# Patient Record
Sex: Male | Born: 1945
Health system: Southern US, Community
[De-identification: ages and names within clinical notes are randomized; demographics above are authoritative.]

## PROBLEM LIST (undated history)

## (undated) DIAGNOSIS — E785 Hyperlipidemia, unspecified: Secondary | ICD-10-CM

## (undated) DIAGNOSIS — I259 Chronic ischemic heart disease, unspecified: Secondary | ICD-10-CM

## (undated) DIAGNOSIS — I5022 Chronic systolic (congestive) heart failure: Secondary | ICD-10-CM

## (undated) DIAGNOSIS — I4892 Unspecified atrial flutter: Secondary | ICD-10-CM

## (undated) DIAGNOSIS — I4891 Unspecified atrial fibrillation: Secondary | ICD-10-CM

## (undated) DIAGNOSIS — E7800 Pure hypercholesterolemia, unspecified: Secondary | ICD-10-CM

## (undated) DIAGNOSIS — U071 COVID-19: Secondary | ICD-10-CM

## (undated) DIAGNOSIS — N183 Chronic kidney disease, stage 3 unspecified: Secondary | ICD-10-CM

## (undated) DIAGNOSIS — F419 Anxiety disorder, unspecified: Secondary | ICD-10-CM

## (undated) DIAGNOSIS — I10 Essential (primary) hypertension: Secondary | ICD-10-CM

## (undated) DIAGNOSIS — I639 Cerebral infarction, unspecified: Secondary | ICD-10-CM

## (undated) DIAGNOSIS — M109 Gout, unspecified: Secondary | ICD-10-CM

## (undated) DIAGNOSIS — E119 Type 2 diabetes mellitus without complications: Secondary | ICD-10-CM

## (undated) DIAGNOSIS — I519 Heart disease, unspecified: Secondary | ICD-10-CM

## (undated) DIAGNOSIS — J449 Chronic obstructive pulmonary disease, unspecified: Secondary | ICD-10-CM

## (undated) DIAGNOSIS — I2510 Atherosclerotic heart disease of native coronary artery without angina pectoris: Secondary | ICD-10-CM

## (undated) DIAGNOSIS — N289 Disorder of kidney and ureter, unspecified: Secondary | ICD-10-CM

## (undated) DIAGNOSIS — F32A Depression, unspecified: Secondary | ICD-10-CM

## (undated) DIAGNOSIS — F329 Major depressive disorder, single episode, unspecified: Secondary | ICD-10-CM

## (undated) DIAGNOSIS — G4733 Obstructive sleep apnea (adult) (pediatric): Secondary | ICD-10-CM

## (undated) HISTORY — DX: Chronic obstructive pulmonary disease, unspecified: J44.9

## (undated) HISTORY — DX: Anxiety disorder, unspecified: F41.9

## (undated) HISTORY — DX: Hyperlipidemia, unspecified: E78.5

## (undated) HISTORY — DX: Unspecified atrial fibrillation: I48.91

## (undated) HISTORY — DX: Depression, unspecified: F32.A

## (undated) HISTORY — DX: Type 2 diabetes mellitus without complications: E11.9

## (undated) HISTORY — DX: Gout, unspecified: M10.9

## (undated) HISTORY — DX: Unspecified atrial fibrillation: I48.92

## (undated) HISTORY — PX: TONSILLECTOMY AND ADENOIDECTOMY: SUR1326

## (undated) HISTORY — DX: Chronic systolic (congestive) heart failure: I50.22

## (undated) HISTORY — DX: Cerebral infarction, unspecified: I63.9

## (undated) HISTORY — DX: Essential (primary) hypertension: I10

## (undated) HISTORY — DX: Obstructive sleep apnea (adult) (pediatric): G47.33

## (undated) HISTORY — DX: COVID-19: U07.1

## (undated) HISTORY — DX: Chronic kidney disease, stage 3 unspecified: N18.30

## (undated) HISTORY — DX: Major depressive disorder, single episode, unspecified: F32.9

## (undated) HISTORY — DX: Heart disease, unspecified: I51.9

## (undated) HISTORY — PX: CORONARY ARTERY BYPASS GRAFT: SHX141

## (undated) HISTORY — DX: Disorder of kidney and ureter, unspecified: N28.9

## (undated) HISTORY — PX: CATARACT EXTRACTION: SUR2

## (undated) HISTORY — PX: HERNIA REPAIR: SHX51

## (undated) HISTORY — DX: Chronic ischemic heart disease, unspecified: I25.9

## (undated) HISTORY — DX: Pure hypercholesterolemia, unspecified: E78.00

---

## 1999-07-07 HISTORY — PX: CORONARY ARTERY BYPASS GRAFT: SHX141

## 1999-09-16 HISTORY — PX: OTHER SURGICAL HISTORY: SHX169

## 2001-07-06 DIAGNOSIS — I639 Cerebral infarction, unspecified: Secondary | ICD-10-CM

## 2001-07-06 HISTORY — DX: Cerebral infarction, unspecified: I63.9

## 2009-07-06 HISTORY — PX: OTHER SURGICAL HISTORY: SHX169

## 2009-07-06 HISTORY — PX: ATRIAL FIBRILLATION ABLATION: EP1191

## 2014-08-09 ENCOUNTER — Encounter: Payer: Self-pay | Admitting: Neurology

## 2014-08-10 ENCOUNTER — Encounter: Payer: Self-pay | Admitting: Neurology

## 2014-08-10 ENCOUNTER — Ambulatory Visit (INDEPENDENT_AMBULATORY_CARE_PROVIDER_SITE_OTHER): Payer: Medicare HMO | Admitting: Neurology

## 2014-08-10 VITALS — BP 109/68 | HR 90 | Temp 97.9°F | Ht 69.0 in | Wt 280.0 lb

## 2014-08-10 DIAGNOSIS — R251 Tremor, unspecified: Secondary | ICD-10-CM

## 2014-08-10 NOTE — Progress Notes (Signed)
Subjective:    Patient ID: KI CORBO is a 69 y.o. male.  HPI     Star Age, MD, PhD [facility] [address] [place], [place] [address]  Dear Dr. Harmon Pier,   I saw your patient, Mose Colaizzi, upon your kind request in my neurologic clinic today for initial consultation of his tremors. The patient is accompanied by his wife today. As you know, Mr. Ciancio is a 69 year old right-handed gentleman with an underlying complex medical history of obesity, kidney disease, arthritis, A. fib, ischemic heart disease, status post CABG, stroke in 4/03 (saw neurologist in [place] ), type 2 diabetes, hyperlipidemia, OSA on CPAP, and hypertension, who reports intermittent bilateral upper extremity tremors for the past few years, more so in the last year, L more than R. this is bothersome to him at times when he tries to do fine motor tasks. He has had trouble with shaving and with feeding himself. He has an occasional rest tremor. He does not have a family history of tremors. He does not have a history of head injury. He does have low back pain. He does not have a family history of Parkinson's disease. Tremor is every day, but mostly mild and certainly intermittent. He quit smoking on 06/17/2011. He quit smoking after his open-heart surgery but restarted it after he started working again.  He sees a cardiologist, he sees a pulmonologist in [place], [place]. He also has a nephrologist. He had an eye exam a year ago. He is due for his yearly physical with you next month. He's never had any thyroid dysfunction as far as he knows. He has been on sertraline 150 mg for quite some time. He has been on sertraline for years. His wife wonders if it is effective at this time. She feels that he is quite irritable at times. He does not report much in the way of depressive symptoms and feels his mood is fair. He has been on Symbicort, and Spiriva and as needed  pro-air.  His Past Medical History Is Significant For: Past Medical History  Diagnosis Date  . Heart disease     Atherosclerotic  . Atrial fibrillation     His Past Surgical History Is Significant For: Past Surgical History  Procedure Laterality Date  . Coronary artery bypass graft      His Family History Is Significant For: No family history on file.  His Social History Is Significant For: History   Social History  . Marital Status: Married    Spouse Name: Aram Beecham    Number of Children: 3  . Years of Education: 14   Occupational History  .      retired   Social History Main Topics  . Smoking status: Former Research scientist (life sciences)  . Smokeless tobacco: Never Used  . Alcohol Use: No  . Drug Use: No  . Sexual Activity: None   Other Topics Concern  . None   Social History Narrative   Consumes no caffeine    His Allergies Are:  Allergies  Allergen Reactions  . Codeine Anaphylaxis    Tightness in chest  :   His Current Medications Are:  Outpatient Encounter Prescriptions as of 08/10/2014  Medication Sig  . albuterol (PROVENTIL HFA;VENTOLIN HFA) 108 (90 BASE) MCG/ACT inhaler Inhale into the lungs as needed for wheezing or shortness of breath. Inhale one to two puffs by mouth as needed  . allopurinol (ZYLOPRIM) 100 MG tablet Take 100 mg by mouth daily.  Marland Kitchen  amoxicillin-clavulanate (AUGMENTIN) 875-125 MG per tablet Take 1 tablet by mouth 2 (two) times daily.  . benazepril (LOTENSIN) 20 MG tablet Take 20 mg by mouth daily.  . Blood Glucose Monitoring Suppl (ONE TOUCH ULTRA 2) W/DEVICE KIT by Does not apply route.  . budesonide-formoterol (SYMBICORT) 160-4.5 MCG/ACT inhaler Inhale 2 puffs into the lungs daily.  . cephALEXin (KEFLEX) 500 MG capsule Take 500 mg by mouth 3 (three) times daily.  . clopidogrel (PLAVIX) 75 MG tablet Take 75 mg by mouth daily.  . colchicine 0.6 MG tablet Take 0.6 mg by mouth 2 (two) times daily. 2 tablets by mouth now, then 1 tablet by mouth 2 hours later  .  colchicine 0.6 MG tablet Take 0.6 mg by mouth daily.  Marland Kitchen diltiazem (CARDIZEM) 120 MG tablet Take 120 mg by mouth daily.  Marland Kitchen glucose blood test strip 1 each as needed for other. Onetouch ultra test strips,Use as instructed  . Insulin Detemir (LEVEMIR) 100 UNIT/ML Pen Inject into the skin daily at 10 pm.  . metoprolol succinate (TOPROL-XL) 50 MG 24 hr tablet Take 50 mg by mouth 2 (two) times daily. Take 1 tablet 2 times daily  . mometasone (NASONEX) 50 MCG/ACT nasal spray Place 2 sprays into the nose daily. 1 puff daily in the nostrils  . NIACIN PO Take 1,000 mg by mouth 2 (two) times daily.  Marland Kitchen omega-3 acid ethyl esters (LOVAZA) 1 G capsule Take by mouth 2 (two) times daily.  . Potassium Chloride (KLOR-CON 10 PO) Take by mouth daily. Extended release  . rosuvastatin (CRESTOR) 20 MG tablet Take 20 mg by mouth daily.  . sertraline (ZOLOFT) 100 MG tablet Take 100 mg by mouth daily.  Marland Kitchen spironolactone (ALDACTONE) 100 MG tablet Take 100 mg by mouth daily.  . temazepam (RESTORIL) 15 MG capsule Take 15 mg by mouth at bedtime as needed for sleep.  . Tiotropium Bromide Monohydrate 2.5 MCG/ACT AERS Inhale into the lungs. 1 inhalation  . torsemide (DEMADEX) 20 MG tablet Take 20 mg by mouth 2 (two) times daily.  Marland Kitchen warfarin (COUMADIN) 1 MG tablet Take 1 mg by mouth daily.  Marland Kitchen warfarin (COUMADIN) 3 MG tablet Take 3 mg by mouth daily.  :   Review of Systems:  Out of a complete 14 point review of systems, all are reviewed and negative with the exception of these symptoms as listed below:   Review of Systems  HENT: Positive for hearing loss.   Respiratory: Positive for shortness of breath.   Cardiovascular: Positive for leg swelling.  Genitourinary:       Impotence  Musculoskeletal:       Joint pain and aching muscles  Allergic/Immunologic:       Runny  nose  Neurological: Positive for tremors.       Snoring(CPAP)[place]  Hematological: Bruises/bleeds easily.  Psychiatric/Behavioral:       Depression,  decreased energy, disinterest in activities    Objective:  Neurologic Exam  Physical Exam Physical Examination:   Filed Vitals:   08/10/14 0920  BP: 109/68  Pulse: 90  Temp: 97.9 F (36.6 C)   General Examination: The patient is a very pleasant 69 y.o. male in no acute distress. He appears well-developed and well-nourished and well groomed.   HEENT: Normocephalic, atraumatic, pupils are equal, round and reactive to light and accommodation. Funduscopic exam is normal with sharp disc margins noted. Extraocular tracking is good without limitation to gaze excursion or nystagmus noted. Normal smooth pursuit is noted. Hearing is grossly  intact. Tympanic membranes are clear bilaterally. Face is symmetric with normal facial animation and normal facial sensation. Speech is clear with no dysarthria noted. There is no hypophonia. There is no lip, neck/head, jaw or voice tremor. Neck is supple with full range of passive and active motion. There are no carotid bruits on auscultation. Oropharynx exam reveals: mild mouth dryness, adequate dental hygiene and marked airway crowding, due to  larger tongue and thick soft palate. Tonsils are absent. Mallampati is class III.  Chest: Clear to auscultation without wheezing, rhonchi or crackles noted.  Heart: S1+S2+0, regular and normal without murmurs, rubs or gallops noted. his heart rate is regular at this time. He may not be in A. fib at this point.    Abdomen: Soft, non-tender and non-distended with normal bowel sounds appreciated on auscultation.  Extremities: There is 1+ pitting edema in the lower extremities bilaterally, from the knees on down and he is wearing compression stockings to up to the knees bilaterally. Pedal pulses are difficult to palpate.  Skin: Warm and dry without trophic changes noted. There are no varicose veins.  Musculoskeletal: exam reveals no obvious joint deformities, tenderness or joint swelling or erythema, he has a scar on the  left forearm from where blood vessels were harvested for his CABG surgery.   Neurologically:  Mental status: The patient is awake, alert and oriented in all 4 spheres. His immediate and remote memory, attention, language skills and fund of knowledge are appropriate. There is no evidence of aphasia, agnosia, apraxia or anomia. Speech is clear with normal prosody and enunciation. Thought process is linear. Mood is normal and affect is normal.  Cranial nerves II - XII are as described above under HEENT exam. In addition: shoulder shrug is normal with equal shoulder height noted. Motor exam: Normal bulk, strength and tone is noted. There is no drift, or rebound. There is no resting tremor. There is a bilateral upper extremity postural and action tremor, which is minimal in degree on the right and mild on the left. There tremor frequency is fairly fast and the amplitude is small. On Archimedes spiral drawing there is minimal tremulousness noted with the R hand and mild to moderate shakiness with the L. Handwriting is very clear and legible. There is no evidence of micrographia.  Romberg is negative. Reflexes are 2+ throughout. Babinski: Toes are flexor bilaterally. Fine motor skills and coordination: intact with normal finger taps, normal hand movements, normal rapid alternating patting, normal foot taps and normal foot agility.  Cerebellar testing: No dysmetria or intention tremor on finger to nose testing. Heel to shin is unremarkable bilaterally. There is no truncal or gait ataxia.  Sensory exam: intact to light touch, pinprick, vibration, temperature sense in the upper and lower extremities.  Gait, station and balance: He stands easily. No veering to one side is noted. No leaning to one side is noted. Posture is age-appropriate and stance is narrow based. Gait shows normal stride length and normal pace. No problems turning are noted. He turns in 3 steps. Tandem walk is  difficult for him.                  Assessment and Plan:   In summary, THATCHER DOBERSTEIN is a very pleasant 69 y.o.-year old male with an underlying complex medical history of obesity, kidney disease, arthritis, A. fib, ischemic heart disease, status post CABG, stroke in 4/03 (saw neurologist in [place] ), type 2 diabetes, hyperlipidemia, OSA on CPAP, and hypertension, who  reports intermittent bilateral upper extremity tremors for the past few years, more so in the last year, L more than R. on examination, he has a slight right upper extremity tremor, and a mild left upper extremity tremor, no significant problems with fine motor skills, no ataxia, no signs of parkinsonism, and his history is not suggestive of essential tremor. This could be an enhanced physiological tremor with possible medication effect from his COPD medication and also his SSRI type antidepressant. I had a long chat with the patient and his wife about this and symptomatic treatment for tremors. Unfortunately, he is already on quite a few medications including a beta blocker which we sometimes utilize for symptomatically treatment of tremors as you know. However, when he was on higher doses of metoprolol, such as 100 mg twice daily, he had drop in his blood pressure. Therefore, he was placed on diltiazem and metoprolol was reduced to currently 50 mg twice daily. Another medication we utilize for symptomatic control of tremors is primidone or Mysoline which unfortunately can be sedating and exacerbate balance. Again, his tremors rather mild at this time and I do not see any other focal neurologic signs or symptoms. He is reassured in that regard. I do not think we need to do any more testing at this point. I would like for him to have his thyroid function checked as a routine measure when he sees you next time which is due next month. At this moment I can see him back on an as-needed basis. I did talk to him about potential tremor triggers such as dehydration, low blood sugar  values, stress, anxiety, and sleep deprivation. He is compliant with CPAP therapy for his obstructive sleep apnea and sleeps well he states. His sugar values have been good. I answered all their questions today and the patient and his wife were in agreement.  Thank you very much for allowing me to participate in the care of this nice patient. If I can be of any further assistance to you please do not hesitate to call me at [phone].  Sincerely,   Star Age, MD, PhD

## 2014-08-10 NOTE — Patient Instructions (Addendum)
  Please remember, that any kind of tremor may be exacerbated by anxiety, anger, nervousness, excitement, dehydration, sleep deprivation, by caffeine, and low blood sugar values or blood sugar fluctuations. Some medications, especially some antidepressants and lithium can cause or exacerbate tremors. Tremors may temporarily calm down her subside with the use of a benzodiazepine such as Valium or related medications and with alcohol. Be aware however that drinking alcohol is not an approved treatment or appropriate treatment for tremor control and long-term use of benzodiazepines such as Valium, lorazepam, alprazolam, or clonazepam can cause habit formation, physical and psychological addiction.  Your tremor is mild at this time. It may be due to some of you COPD and depression medication. Talk to Dr. Harmon Pier about changing to another antidepressant, if you feel the sertraline is not as effective any longer.   I can see you back as needed.

## 2014-12-13 ENCOUNTER — Institutional Professional Consult (permissible substitution): Payer: Self-pay | Admitting: Pulmonary Disease

## 2014-12-14 ENCOUNTER — Other Ambulatory Visit (INDEPENDENT_AMBULATORY_CARE_PROVIDER_SITE_OTHER): Payer: Medicare FFS

## 2014-12-14 ENCOUNTER — Encounter: Payer: Self-pay | Admitting: Pulmonary Disease

## 2014-12-14 ENCOUNTER — Encounter (INDEPENDENT_AMBULATORY_CARE_PROVIDER_SITE_OTHER): Payer: Self-pay

## 2014-12-14 ENCOUNTER — Ambulatory Visit (INDEPENDENT_AMBULATORY_CARE_PROVIDER_SITE_OTHER)
Admission: RE | Admit: 2014-12-14 | Discharge: 2014-12-14 | Disposition: A | Discharge status: Home / Self Care | Payer: Medicare FFS | Source: Ambulatory Visit | Attending: Pulmonary Disease | Admitting: Pulmonary Disease

## 2014-12-14 ENCOUNTER — Ambulatory Visit (INDEPENDENT_AMBULATORY_CARE_PROVIDER_SITE_OTHER): Payer: Medicare FFS | Admitting: Pulmonary Disease

## 2014-12-14 VITALS — BP 118/60 | HR 72 | Temp 97.3°F | Ht 72.0 in | Wt 275.0 lb

## 2014-12-14 DIAGNOSIS — G4733 Obstructive sleep apnea (adult) (pediatric): Secondary | ICD-10-CM

## 2014-12-14 DIAGNOSIS — E669 Obesity, unspecified: Secondary | ICD-10-CM

## 2014-12-14 DIAGNOSIS — J449 Chronic obstructive pulmonary disease, unspecified: Secondary | ICD-10-CM

## 2014-12-14 DIAGNOSIS — E118 Type 2 diabetes mellitus with unspecified complications: Secondary | ICD-10-CM | POA: Insufficient documentation

## 2014-12-14 DIAGNOSIS — I2510 Atherosclerotic heart disease of native coronary artery without angina pectoris: Secondary | ICD-10-CM | POA: Diagnosis not present

## 2014-12-14 DIAGNOSIS — I5022 Chronic systolic (congestive) heart failure: Secondary | ICD-10-CM

## 2014-12-14 DIAGNOSIS — I10 Essential (primary) hypertension: Secondary | ICD-10-CM | POA: Diagnosis not present

## 2014-12-14 DIAGNOSIS — Z9989 Dependence on other enabling machines and devices: Secondary | ICD-10-CM

## 2014-12-14 DIAGNOSIS — I4821 Permanent atrial fibrillation: Secondary | ICD-10-CM | POA: Insufficient documentation

## 2014-12-14 DIAGNOSIS — I482 Chronic atrial fibrillation, unspecified: Secondary | ICD-10-CM

## 2014-12-14 DIAGNOSIS — I5023 Acute on chronic systolic (congestive) heart failure: Secondary | ICD-10-CM | POA: Insufficient documentation

## 2014-12-14 DIAGNOSIS — E785 Hyperlipidemia, unspecified: Secondary | ICD-10-CM | POA: Insufficient documentation

## 2014-12-14 DIAGNOSIS — Z951 Presence of aortocoronary bypass graft: Secondary | ICD-10-CM

## 2014-12-14 DIAGNOSIS — I4891 Unspecified atrial fibrillation: Secondary | ICD-10-CM | POA: Insufficient documentation

## 2014-12-14 DIAGNOSIS — E1169 Type 2 diabetes mellitus with other specified complication: Secondary | ICD-10-CM | POA: Insufficient documentation

## 2014-12-14 DIAGNOSIS — I509 Heart failure, unspecified: Secondary | ICD-10-CM | POA: Insufficient documentation

## 2014-12-14 LAB — BASIC METABOLIC PANEL
BUN: 46 mg/dL — AB (ref 6–23)
CO2: 26 mEq/L (ref 19–32)
Calcium: 10.9 mg/dL — ABNORMAL HIGH (ref 8.4–10.5)
Chloride: 100 mEq/L (ref 96–112)
Creatinine, Ser: 1.6 mg/dL — ABNORMAL HIGH (ref 0.40–1.50)
GFR: 46.12 mL/min — ABNORMAL LOW (ref >60.00)
Glucose, Bld: 141 mg/dL — ABNORMAL HIGH (ref 70–99)
POTASSIUM: 4.7 meq/L (ref 3.5–5.1)
Sodium: 136 mEq/L (ref 135–145)

## 2014-12-14 LAB — CBC WITH DIFFERENTIAL/PLATELET
BASOS ABS: 0 10*3/uL (ref 0.0–0.1)
Basophils Relative: 0.4 % (ref 0.0–3.0)
EOS ABS: 0.1 10*3/uL (ref 0.0–0.7)
Eosinophils Relative: 1.4 % (ref 0.0–5.0)
HEMATOCRIT: 51.2 % (ref 39.0–52.0)
Hemoglobin: 16.8 g/dL (ref 13.0–17.0)
Lymphocytes Relative: 16.6 % (ref 12.0–46.0)
Lymphs Abs: 1.4 10*3/uL (ref 0.7–4.0)
MCHC: 32.8 g/dL (ref 30.0–36.0)
MCV: 87.5 fl (ref 78.0–100.0)
MONOS PCT: 10.7 % (ref 3.0–12.0)
Monocytes Absolute: 0.9 10*3/uL (ref 0.1–1.0)
NEUTROS PCT: 70.9 % (ref 43.0–77.0)
Neutro Abs: 5.8 10*3/uL (ref 1.4–7.7)
Platelets: 206 10*3/uL (ref 150.0–400.0)
RBC: 5.85 Mil/uL — AB (ref 4.22–5.81)
RDW: 16.3 % — AB (ref 11.5–15.5)
WBC: 8.1 10*3/uL (ref 4.0–10.5)

## 2014-12-14 LAB — HEPATIC FUNCTION PANEL
ALK PHOS: 76 U/L (ref 39–117)
ALT: 20 U/L (ref 0–53)
AST: 16 U/L (ref 0–37)
Albumin: 4.6 g/dL (ref 3.5–5.2)
Bilirubin, Direct: 0.1 mg/dL (ref 0.0–0.3)
TOTAL PROTEIN: 7.9 g/dL (ref 6.0–8.3)
Total Bilirubin: 0.7 mg/dL (ref 0.2–1.2)

## 2014-12-14 LAB — BRAIN NATRIURETIC PEPTIDE: PRO B NATRI PEPTIDE: 72 pg/mL (ref 0.0–100.0)

## 2014-12-14 LAB — SEDIMENTATION RATE: SED RATE: 20 mm/h (ref 0–22)

## 2014-12-14 LAB — TSH: TSH: 1.37 u[IU]/mL (ref 0.35–4.50)

## 2014-12-14 MED ORDER — FLUTICASONE-SALMETEROL 500-50 MCG/DOSE IN AEPB: 1.0000 | INHALATION_SPRAY | Freq: Two times a day (BID) | RESPIRATORY_TRACT | Status: DC

## 2014-12-14 NOTE — Progress Notes (Signed)
Subjective:     Patient ID: [facility], male   DOB: 1945-11-25, 69 y.o.   MRN: [id number]  HPI 69 y/o WM, from [facility], self referred for a pulmonary second opinion>  His local PCP is Tommy Riley, his Cardiologist is [facility] Riley, and his Pulonologist is [facility];  He has multisystem disease w/ OSA, obesity, and COPD; plus HBP, CAD- s/pCABG, AFib, Hyperlipidemia, DM, renal insuffic, Hx stroke, and arthritis...  From the pulmonary standpoint- he has OSA & on CPAP x yrs, doing satis w/ his machine & mask, rests well & denies daytime sleepiness issues;  He is currently on 3 inhalers> Pulmicort-2spBid, Spiriva daily, and Proair rescue inhaler but having to use this 3-4 times daily; he was prev on Symbicort160-2spBid but this was stopped after a Neuro eval by [facility] who thought his LABA was contributing to the shaking & the tremor has improved off the Formoterol (interesting that it has NOT returned w/ incr use of the Albuterol in Proair);  He is c/o increased SOB & feels it is getting worse, specifically notes DOE w/ min activity & ADLs like showering, dressing, min work about the house; on further questioning it is apparent that his increased DOE has paralleled his increased sedentary lifestyle; he tells me he was Dx w/ CAD & had a 3 vessel CABG in 2001 after which he participated in cardiac rehab & he was apparently doing quite well at that time but developed AFib, had several TIAs, and retired on disability in 2003; since then he has had an increasingly sedentary lifestyle with progressive DOE despite his cardiac & pulmonary meds...       PULM> Tommy Riley is an ex-smoker, having started in his teens and smoked for 40+yrs up to 1-2ppd in his adult life, and quit in 2012 at age 18 or so (I est a 18 pack-year history);  He notes SOB as above- plus cough, small amt of thick beige sputum that is hard to expectorate, no hemoptysis & no CP; he related a hx of one prev bronchoscopy w/ lavage ~10 yrs ago due to  "thick phlegm & plugs that had to be sucked out";  He denies hx of asthma in the past but he says he's had pneumonia x3 in the past including double pneumonia when he was in the service in [employer]; he says he was first diagnosed w/ COPD about 3 yrs ago... 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 FamHx is neg for any respiratory illnesses... We have requested old records from [facility] including prev CXRs, CT Chest, FullPFTs, ABGs, etc... Current Meds> Pulmicort-2spBid, Spiriva daily, and Proair rescue inhaler but having to use this 3-4 times daily.      CARDS> He has a hx of mult coronary risk factors- HBP, DM, HL; he had ?MI 2001 w/ subseq three vessel CABG; he developed AFib & had several cardioversions w/o success;  Records are avail in [facility] from [facility] in 2011- he had EP studies and pulmonary vein isolation procedure which was transiently successful but the AFib recurred shortly thereafter (his fatigue & decr exercise tolerance where attributed to the AFib); a cardiac MRI at that time is said to show an EF=45-50% w/ marked LAE & no thrombus, but evid for mult subendocardial infarcts in all 3 coronary art distributions; he din't tol Tykosyn & was restarted on  Amio... We have requested records from [facility] including his most recent 2DEcho (pt says his heart was at 37%) & ant cath data.  Current Meds> MetopER50Bid, Cardizem120, Demadex20Bid, Aldactone100, ?K20, ASA/ Plavix/ Coumadin...  EXAM showed Afeb, VSS, O2sat=95% on RA; he is obese- 6' Tall, 275#, BMI=37;  HEENT- short/ heavy neck, mallampati3;  Chest- decr BS w/ few scat rhonchi at bases, no wheezing or rales;  Heart- irreg gr1/6 SEM w/o r/g;  Abd- obese, soft, neg;  Ext- TEDs, 1+edema  CXR 12/14/14 showed cardiomegaly, prior CABG, clear lungs except for some bibasilar scarring,  NAD...   Spirometry 12/14/14 showed FVC=2.73 (56%), FEV1=1.63 (43%), %1sec=60, mid-flows are reduced at 24% predicted; this is c/w mod airflow obstruction and GOLD Stage3 COPD...   Ambulatory oxygen saturation test 12/2014> O2sat=95% on RA at rest w/ pulse=97/min;  He walked 2 laps in out office w/ lowest O2sat=93% w/ pulse=111/min & stopped due to SOB & fatigue.  LABS 12/2014:  Chems- ok x BS=141, BUN=46, Cr=1.6;  CBC- wnl w/ Hg=16.8;  BNP=72;  Sed=20;  TSH=1.37  IMP/PLAN>>   We took extra time to explain the multifactorial nature of his dyspnea- pulm/ cardiac/ deconditioning;  From the pulmonary standpoint we decided to change the Pulmicort to ADVAIR500-Bid, continue the [facility] daily, continue the [facility] rescue inhaler but hopefully he will be able to decr it's use;  He can also use MUCINEX 661mQid w/ fluids for the thick phlegm;  From the cardiac perspective we have requested records from [facility](esp the most recent 2DEcho to check EF & PA pressure estimates), note that his BNP is norm at 72;  Finally it is imperative that he starts back into a slow gradual exercise program (eg- [facility] etc) & we discussed this in detail... I have asked him to ret in 4-6wks for recheck & hopefully we will have his old records to review.    Past Medical History  Diagnosis Date  . Heart disease  >> on MetoprololER, Cardizem, Demadex, Aldactone, ?KCl, off prev Lotensin     Atherosclerotic  . Atrial fibrillation >> on ASA, Plavix, Coumadin   . Hypertension   . Diabetes mellitus without complication >> on Levemir insulin   . Atrial fibrillation   . Stroke 2003  . Kidney disease     stage 3  . High cholesterol >> on Crestor, Lovaza, Niacin   . Depression   . COPD (chronic obstructive pulmonary disease)   . OSA (obstructive sleep apnea)     Past Surgical History  Procedure Laterality Date  . Coronary artery bypass graft    . Triple by pass  09/16/1999  . Heart ablation  2011    Outpatient  Encounter Prescriptions as of 12/14/2014  Medication Sig  . ACCU-CHEK AVIVA PLUS test strip   . albuterol (PROVENTIL HFA;VENTOLIN HFA) 108 (90 BASE) MCG/ACT inhaler Inhale 2 puffs into the lungs every 4 (four) hours as needed for wheezing or shortness of breath. Inhale one to two puffs by mouth as needed  . allopurinol (ZYLOPRIM) 100 MG tablet Take 100 mg by mouth 2 (two) times daily.   .Marland Kitchenaspirin 81 MG tablet Take 81 mg by mouth daily.  . Blood Glucose Monitoring Suppl (ONE TOUCH ULTRA 2) W/DEVICE KIT by Does not apply route.  . Cholecalciferol (VITAMIN D3) 2000 UNITS TABS Take by mouth daily.  . clopidogrel (PLAVIX) 75 MG tablet Take 75 mg by mouth daily.  .Marland Kitchendiltiazem (CARDIZEM) 120 MG tablet Take 120 mg by mouth daily.  .Marland Kitchen  folic acid (FOLVITE) 007 MCG tablet Take 400 mcg by mouth daily.  . insulin detemir (LEVEMIR) 100 UNIT/ML injection Inject 80 Units into the skin daily.  . Melatonin 5 MG TABS Take by mouth daily.  . metoprolol succinate (TOPROL-XL) 50 MG 24 hr tablet Take 50 mg by mouth 2 (two) times daily. Take 1 tablet 2 times daily  . Multiple Vitamins-Minerals (CENTRUM Tommy PO) Take by mouth 2 (two) times daily.  Marland Kitchen NIACIN PO Take 500 mg by mouth 2 (two) times daily. Take 500 mg two times daily  . omega-3 acid ethyl esters (LOVAZA) 1 G capsule Take by mouth 2 (two) times daily.  . Potassium Chloride (KLOR-CON 10 PO) Take by mouth daily. Extended release  . rosuvastatin (CRESTOR) 20 MG tablet Take 20 mg by mouth daily.  Marland Kitchen spironolactone (ALDACTONE) 100 MG tablet Take 100 mg by mouth daily.  . temazepam (RESTORIL) 15 MG capsule Take 15 mg by mouth at bedtime as needed for sleep.  Marland Kitchen tiotropium (SPIRIVA HANDIHALER) 18 MCG inhalation capsule Place 18 mcg into inhaler and inhale daily.  Marland Kitchen torsemide (DEMADEX) 20 MG tablet Take 20 mg by mouth 2 (two) times daily.  Marland Kitchen warfarin (COUMADIN) 3 MG tablet Take 3 mg by mouth daily.  . Fluticasone-Salmeterol (ADVAIR DISKUS) 500-50 MCG/DOSE AEPB Inhale  1 puff into the lungs 2 (two) times daily.  . [DISCONTINUED] benazepril (LOTENSIN) 20 MG tablet Take 20 mg by mouth daily.  . [DISCONTINUED] budesonide-formoterol (SYMBICORT) 160-4.5 MCG/ACT inhaler Inhale 2 puffs into the lungs daily.  . [DISCONTINUED] Insulin Detemir (LEVEMIR) 100 UNIT/ML Pen Inject 80 Units into the skin daily at 10 pm.   . [DISCONTINUED] sertraline (ZOLOFT) 100 MG tablet Take 100 mg by mouth daily. Take 1 1/2 tablet daily   No facility-administered encounter medications on file as of 12/14/2014.    Allergies  Allergen Reactions  . Codeine Anaphylaxis    Tightness in chest    Immunization History  Administered Date(s) Administered  . Influenza-Unspecified 04/14/2014  . Pneumococcal Conjugate-13 04/14/2014    Family History  Problem Relation Age of Onset  . Stroke Father   . Heart Problems Mother     History   Social History  . Marital Status: Married    Spouse Name: Aram Beecham  . Number of Children: 3  . Years of Education: 14   Occupational History  .      retired   Social History Main Topics  . Smoking status: Former Smoker -- 2.50 packs/day for 50 years    Types: Cigarettes    Quit date: 06/15/2011  . Smokeless tobacco: Never Used  . Alcohol Use: No  . Drug Use: No  . Sexual Activity: Not on file   Other Topics Concern  . Not on file   Social History Narrative   Consumes no caffeine    Current Medications, Allergies, Past Medical History, Past Surgical History, Family History, and Social History were reviewed in [facility] record.   Review of Systems            All symptoms NEG except where BOLDED >>  Constitutional:  F/C/S, fatigue, anorexia, unexpected weight change. HEENT:  HA, visual changes, hearing loss, earache, nasal symptoms, sore throat, mouth sores, hoarseness. Resp:  cough, sputum, hemoptysis; SOB, tightness, wheezing. Cardio:  CP, palpit, DOE, orthopnea, edema. GI:  N/V/D/C, blood in stool; reflux,  abd pain, distention, gas. GU:  dysuria, freq, urgency, hematuria, flank pain, voiding difficulty. MS:  joint pain, swelling, tenderness,  decr ROM; neck pain, back pain, etc. Neuro:  HA, tremors, seizures, dizziness, syncope, weakness, numbness, gait abn. Skin:  suspicious lesions or skin rash. Heme:  adenopathy, bruising, bleeding. Psyche:  confusion, agitation, sleep disturbance, hallucinations, anxiety, depression suicidal.   Objective:   Physical Exam      Vital Signs:  Reviewed...  General:  WD, obese, 69 y/o WM in NAD; alert & oriented; pleasant & cooperative... HEENT:  [place]/AT; Conjunctiva- pink, Sclera- nonicteric, EOM-wnl, PERRLA, EACs-clear, TMs-wnl; NOSE-clear; THROAT-clear & wnl. Neck:  Supple w/ decr ROM; no JVD; normal carotid impulses w/o bruits; no thyromegaly or nodules palpated; no lymphadenopathy. Chest:  decr BS at bases, few scat rhonchi, no wheezing,rales, or signs of consolidation... Heart:  Irreg, AFib, Gr1/6 SEM w/o rubs or gallops detected... Abdomen:  Soft & nontender- no guarding or rebound; normal bowel sounds; no organomegaly or masses palpated. Ext:  decrROM; +arthritic changes; no varicose veins, +venous insuffic, 1+ edema;  Pulses intact w/o bruits. Neuro:  No focal neuro deficits,gait normal & balance OK. Derm:  No lesions noted; no rash etc. Lymph:  No cervical, supraclavicular, axillary, or inguinal adenopathy palpated.   Assessment:     Moderate airflow obsruction & GOLD Stage3 COPD -- Rx w/ ADVAIR250Bid, SPIRIVA daily, PROAIR rescue prn; plus MUCINEX, fluids, etc... OSA- seems to be well controlled on CPAP17 -- we will try to get data from [facility] including download data re: usage & O2sats...  Severe deconditioning -- he hasn't moved in >40yr but remembers how good he felt during cardiac rehab in 2001; may need formal program, for now just start moving (eg. [employer], etc). Obesity  CARDS -- HBP, CAD s/pCABG, permanent AFib, chronic systolic  CHF, etc... [facility].  Medical issues -- DM on insulin, Renal insuffic w/ Cr=1.6 range, HL, arthritis, etc...  IMP/PLAN>>   We took extra time to explain the multifactorial nature of his dyspnea- pulm/ cardiac/ deconditioning;  From the pulmonary standpoint we decided to change the Pulmicort to ADVAIR500-Bid, [facility], [facility] but hopefully he will be able to decr it's use;  He can also use MUCINEX 6069mid w/ fluids for the thick phlegm;  From the cardiac perspective [facility] most recent 2[facility] to check EF & PA pressure estimates), note that his BNP is norm at 72;  Finally it is imperative that he starts back into a slow gradual exercise program (eg- [facility] etc) & we discussed this in detail... I have asked him to ret in 4-6wks for recheck & hopefully we will have his old records to review.     Plan:     Patient's Medications  New Prescriptions   FLUTICASONE-SALMETEROL (ADVAIR DISKUS) 500-50 MCG/DOSE AEPB    Inhale 1 puff into the lungs 2 (two) times daily.  Previous Medications   ACCU-CHEK AVIVA PLUS TEST STRIP       ALBUTEROL (PROVENTIL HFA;VENTOLIN HFA) 108 (90 BASE) MCG/ACT INHALER    Inhale 2 puffs into the lungs every 4 (four) hours as needed for wheezing or shortness of breath. Inhale one to two puffs by mouth as needed   ALLOPURINOL (ZYLOPRIM) 100 MG TABLET    Take 100 mg by mouth 2 (two) times daily.    ASPIRIN 81 MG TABLET    Take 81 mg by mouth daily.   BLOOD GLUCOSE MONITORING SUPPL (ONE TOUCH ULTRA 2) W/DEVICE KIT    by Does not apply route.  CHOLECALCIFEROL (VITAMIN D3) 2000 UNITS TABS    Take by mouth daily.   CLOPIDOGREL (PLAVIX) 75 MG TABLET    Take 75 mg by mouth daily.   DILTIAZEM (CARDIZEM) 120 MG TABLET    Take 120 mg by mouth daily.   FOLIC ACID (FOLVITE) 578 MCG TABLET    Take 400 mcg by mouth daily.   INSULIN DETEMIR (LEVEMIR) 100 UNIT/ML INJECTION     Inject 80 Units into the skin daily.   MELATONIN 5 MG TABS    Take by mouth daily.   METOPROLOL SUCCINATE (TOPROL-XL) 50 MG 24 HR TABLET    Take 50 mg by mouth 2 (two) times daily. Take 1 tablet 2 times daily   MULTIPLE VITAMINS-MINERALS (CENTRUM Tommy PO)    Take by mouth 2 (two) times daily.   NIACIN PO    Take 500 mg by mouth 2 (two) times daily. Take 500 mg two times daily   OMEGA-3 ACID ETHYL ESTERS (LOVAZA) 1 G CAPSULE    Take by mouth 2 (two) times daily.   POTASSIUM CHLORIDE (KLOR-CON 10 PO)    Take by mouth daily. Extended release   ROSUVASTATIN (CRESTOR) 20 MG TABLET    Take 20 mg by mouth daily.   SPIRONOLACTONE (ALDACTONE) 100 MG TABLET    Take 100 mg by mouth daily.   TEMAZEPAM (RESTORIL) 15 MG CAPSULE    Take 15 mg by mouth at bedtime as needed for sleep.   TIOTROPIUM (SPIRIVA HANDIHALER) 18 MCG INHALATION CAPSULE    Place 18 mcg into inhaler and inhale daily.   TORSEMIDE (DEMADEX) 20 MG TABLET    Take 20 mg by mouth 2 (two) times daily.   WARFARIN (COUMADIN) 3 MG TABLET    Take 3 mg by mouth daily.  Modified Medications   No medications on file  Discontinued Medications   BENAZEPRIL (LOTENSIN) 20 MG TABLET    Take 20 mg by mouth daily.   BUDESONIDE-FORMOTEROL (SYMBICORT) 160-4.5 MCG/ACT INHALER    Inhale 2 puffs into the lungs daily.   INSULIN DETEMIR (LEVEMIR) 100 UNIT/ML PEN    Inject 80 Units into the skin daily at 10 pm.    SERTRALINE (ZOLOFT) 100 MG TABLET    Take 100 mg by mouth daily. Take 1 1/2 tablet daily

## 2014-12-14 NOTE — Patient Instructions (Signed)
Neftali- it was great meeting you today.... 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  Today we did a pulmonary function test and an ambulatory oxygen saturation test... We also checked a CXR & some lab work...    We will contact you w/ the results when available...   In the meanwhile>>    Change the Pulmicort inhaler to ADVAIR500- one inhalation twice daily...    Continue the [facility] daily...    Continue the PROAIR rescue inhaler as needed for wheezing...  We will request records from [facility] to review...  As we discussed- the most important factor that will yield to best results for you over time is EXERCISE>    In this regard you need to start a gradual exercise program similar to what you remember from your cardiac rehab days...    Try SILVER SNEAKERS, get back in the pool, join a gym w/ a Physiological scientist, etc...    Start light & gradually increase the exercise (remember the "stairs" analogy  Call for any questions...  Let's plan a follow up visit in 4-6 weeks, sooner if needed for breathing problems.Marland KitchenMarland Kitchen

## 2014-12-17 ENCOUNTER — Telehealth: Payer: Self-pay | Admitting: Pulmonary Disease

## 2014-12-17 MED ORDER — FLUTICASONE-SALMETEROL 500-50 MCG/DOSE IN AEPB: 1.0000 | INHALATION_SPRAY | Freq: Two times a day (BID) | RESPIRATORY_TRACT | Status: DC

## 2014-12-17 NOTE — Telephone Encounter (Signed)
Patient needs refill of Advair, needs hand-written Rx mailed to him so he can submit it to his pharmacy company with some other paperwork that he needs to send with it.  Printed Rx for SN to sign.   [facility] - please place in mail after signed.

## 2014-12-18 NOTE — Telephone Encounter (Signed)
Order signed by SN and mailed to pt. Nothing further is needed.

## 2015-01-11 ENCOUNTER — Ambulatory Visit (INDEPENDENT_AMBULATORY_CARE_PROVIDER_SITE_OTHER): Payer: Medicare FFS | Admitting: Pulmonary Disease

## 2015-01-11 ENCOUNTER — Encounter: Payer: Self-pay | Admitting: Pulmonary Disease

## 2015-01-11 VITALS — BP 130/80 | HR 111 | Temp 97.3°F | Wt 287.6 lb

## 2015-01-11 DIAGNOSIS — G4733 Obstructive sleep apnea (adult) (pediatric): Secondary | ICD-10-CM | POA: Diagnosis not present

## 2015-01-11 DIAGNOSIS — I2510 Atherosclerotic heart disease of native coronary artery without angina pectoris: Secondary | ICD-10-CM

## 2015-01-11 DIAGNOSIS — I482 Chronic atrial fibrillation, unspecified: Secondary | ICD-10-CM

## 2015-01-11 DIAGNOSIS — Z9989 Dependence on other enabling machines and devices: Secondary | ICD-10-CM

## 2015-01-11 DIAGNOSIS — J449 Chronic obstructive pulmonary disease, unspecified: Secondary | ICD-10-CM

## 2015-01-11 DIAGNOSIS — Z951 Presence of aortocoronary bypass graft: Secondary | ICD-10-CM | POA: Diagnosis not present

## 2015-01-11 DIAGNOSIS — I5022 Chronic systolic (congestive) heart failure: Secondary | ICD-10-CM

## 2015-01-11 MED ORDER — FLUTICASONE-SALMETEROL 250-50 MCG/DOSE IN AEPB: 1.0000 | INHALATION_SPRAY | Freq: Two times a day (BID) | RESPIRATORY_TRACT | Status: DC

## 2015-01-11 NOTE — Patient Instructions (Signed)
Today we updated your med list in our EPIC system...    Continue your current medications the same...  We discussed stepping down the ADVAIR from the 500/50 to the 250/50- still one inhalation twice daily...  Continue the [facility] daily...   We will call to refer you to [place] in your area for pulmonary rehab...  Continue to work on weight reduction...  Call for any questions... . Let's plan a follow up visit in 2-80mo, sooner if needed for any breathing problems.Marland KitchenMarland Kitchenproblems.Marland KitchenMarland Kitchen

## 2015-01-11 NOTE — Progress Notes (Signed)
Subjective:     Patient ID: Tommy Riley, male   DOB: 1945-12-10, 69 y.o.   MRN: [id number]  HPI ~  December 14, 2014:  Initial consult w/ SN>   26 y/o WM, from [place], self referred for a pulmonary second opinion>  His local PCP is DrPomposini, his Cardiologist is Secretary/administrator, and his Pulonologist is DrKBird;  He has multisystem disease w/ OSA, obesity, and COPD; plus HBP, CAD- s/pCABG, AFib, Hyperlipidemia, DM, renal insuffic, Hx stroke, and arthritis...  From the pulmonary standpoint- he has OSA & on CPAP x yrs, doing satis w/ his machine & mask, rests well & denies daytime sleepiness issues;  He is currently on 3 inhalers> Pulmicort-2spBid, Spiriva daily, and Proair rescue inhaler but having to use this 3-4 times daily; he was prev on Symbicort160-2spBid but this was stopped after a Neuro eval by DrAthar who thought his LABA was contributing to the shaking & the tremor has improved off the Formoterol (interesting that it has NOT returned w/ incr use of the Albuterol in Proair);  He is c/o increased SOB & feels it is getting worse, specifically notes DOE w/ min activity & ADLs like showering, dressing, min work about the house; on further questioning it is apparent that his increased DOE has paralleled his increased sedentary lifestyle; he tells me he was Dx w/ CAD & had a 3 vessel CABG in 2001 after which he participated in cardiac rehab & he was apparently doing quite well at that time but developed AFib, had several TIAs, and retired on disability in 2003; since then he has had an increasingly sedentary lifestyle with progressive DOE despite his cardiac & pulmonary meds...       PULM> Loretta is an ex-smoker, having started in his teens and smoked for 40+yrs up to 1-2ppd in his adult life, and quit in 2012 at age 17 or so (I est a 32 pack-year history);  He notes SOB as above- plus cough, small amt of thick beige sputum that is hard to expectorate, no hemoptysis & no CP; he related a hx of one prev  bronchoscopy w/ lavage ~10 yrs ago due to "thick phlegm & plugs that had to be sucked out";  He denies hx of asthma in the past but he says he's had pneumonia x3 in the past including double pneumonia when he was in the service in [place]; he says he was first diagnosed w/ COPD about 3 yrs ago... 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 FamHx is neg for any respiratory illnesses... We have requested old records from [facility] including prev CXRs, CT Chest, FullPFTs, ABGs, etc... Current Meds> Pulmicort-2spBid, Spiriva daily, and Proair rescue inhaler but having to use this 3-4 times daily.      CARDS> He has a hx of mult coronary risk factors- HBP, DM, HL; he had ?MI 2001 w/ subseq three vessel CABG; he developed AFib & had several cardioversions w/o success;  Records are avail in [place] from [place] in 2011- he had EP studies and pulmonary vein isolation procedure which was transiently successful but the AFib recurred shortly thereafter (his fatigue & decr exercise tolerance where attributed to the AFib); a cardiac MRI at that time is said to show an EF=45-50% w/ marked LAE & no thrombus, but evid for mult subendocardial infarcts in all  3 coronary art distributions; he didn't tol Tykosyn & was restarted on Amio... We have requested records from [facility] including his most recent 2DEcho (pt says his heart was at 37%) & and cath data.  Current Meds> MetopER50Bid, Cardizem120, Demadex20Qam (extra prn), Aldactone100, ?K20, ASA/ Plavix/ Coumadin...  EXAM showed Afeb, VSS, O2sat=95% on RA; he is obese- 6' Tall, 275#, BMI=37;  HEENT- short/ heavy neck, mallampati3;  Chest- decr BS w/ few scat rhonchi at bases, no wheezing or rales;  Heart- irreg gr1/6 SEM w/o r/g;  Abd- obese, soft, neg;  Ext- TEDs, 1+edema  CXR 12/14/14 showed cardiomegaly, prior  CABG, clear lungs except for some bibasilar scarring, NAD...   Spirometry 12/14/14 showed FVC=2.73 (56%), FEV1=1.63 (43%), %1sec=60, mid-flows are reduced at 24% predicted; this is c/w mod airflow obstruction and GOLD Stage3 COPD...   Ambulatory oxygen saturation test 12/2014> O2sat=95% on RA at rest w/ pulse=97/min;  He walked 2 laps in out office w/ lowest O2sat=93% w/ pulse=111/min & stopped due to SOB & fatigue.  LABS 12/2014:  Chems- ok x BS=141, BUN=46, Cr=1.6;  CBC- wnl w/ Hg=16.8;  BNP=72;  Sed=20;  TSH=1.37  IMP/PLAN>>   We took extra time to explain the multifactorial nature of his dyspnea- pulm/ cardiac/ deconditioning;  From the pulmonary standpoint we decided to change the Pulmicort to ADVAIR500-Bid, continue the [facility] daily, continue the [facility] rescue inhaler but hopefully he will be able to decr it's use;  He can also use MUCINEX 650mQid w/ fluids for the thick phlegm;  From the cardiac perspective we have [place](esp the most recent 2DEcho to check EF & PA pressure estimates), note that his BNP is norm at 72;  Finally it is imperative that he starts back into a slow gradual exercise program (eg- Silver Sneakers etc) & we discussed this in detail... I have asked him to ret in 4-6wks for recheck & hopefully we will have his old records to review.    ~  January 11, 2015:  191moOV w/ SN>  MaMusaeports that he is feeling better on the ADVAIR500Bid, Spiriva daily, & Proair rescue (in fact he hasn't used the [employer] his last visit!);  We have [place] received any records [place])- we will request these again;  He has inquired at his local hosp but they no longer offer pulm rehab program, however he has [facility] and we will call to arrange this for him... Unfortunately MaLavelleas gained a few lbs but admits to not taking the  diet & exercise prescription seriously enough- he will re-double his efforts in this regard... EXAM reveals Afeb, VSS, O2sat=92% on RA, wt=288# today;  HEENT- short neck, mallampati3;  Chest- decr BS at bases otherw clear;  Heart- irreg, gr1/6 SEM, w/o r/g;  Abd- obese, soft, neg;  Ext- TEDs, 1+edema...  IMP/PLAN>>  MaWenceslauss improved on the Advair500, Spiriva, Proair (hasn't used this in the last month); OK to wean to the Avdair250Bid... Diet & exercise are critically important here & we reviewed this...  We [place] again to get [place].     Past Medical History  Diagnosis Date  . Heart disease  >> on MetoprololER, Cardizem, Demadex, Aldactone, ?KCl, off prev Lotensin     Atherosclerotic  . Atrial  fibrillation >> on ASA, Plavix, Coumadin   . Hypertension   . Diabetes mellitus without complication >> on Levemir insulin   . Atrial fibrillation   . Stroke 2003  . Kidney disease     stage 3  . High cholesterol >> on Crestor, Lovaza, Niacin   . Depression   . COPD (chronic obstructive pulmonary disease)   . OSA (obstructive sleep apnea)     Past Surgical History  Procedure Laterality Date  . Coronary artery bypass graft    . Triple by pass  09/16/1999  . Heart ablation  2011    Outpatient Encounter Prescriptions as of 01/11/2015  Medication Sig  . ACCU-CHEK AVIVA PLUS test strip   . albuterol (PROVENTIL HFA;VENTOLIN HFA) 108 (90 BASE) MCG/ACT inhaler Inhale 2 puffs into the lungs every 4 (four) hours as needed for wheezing or shortness of breath. Inhale one to two puffs by mouth as needed  . allopurinol (ZYLOPRIM) 100 MG tablet Take 100 mg by mouth 2 (two) times daily.   Marland Kitchen aspirin 81 MG tablet Take 81 mg by mouth daily.  . Blood Glucose Monitoring Suppl (ONE TOUCH ULTRA 2) W/DEVICE KIT by Does not apply route.  . Cholecalciferol (VITAMIN D3) 2000 UNITS TABS Take by mouth daily.  . clopidogrel  (PLAVIX) 75 MG tablet Take 75 mg by mouth daily.  Marland Kitchen diltiazem (CARDIZEM) 120 MG tablet Take 120 mg by mouth daily.  . folic acid (FOLVITE) 947 MCG tablet Take 400 mcg by mouth daily.  . insulin detemir (LEVEMIR) 100 UNIT/ML injection Inject 80 Units into the skin daily.  . Melatonin 5 MG TABS Take by mouth daily.  . metoprolol succinate (TOPROL-XL) 50 MG 24 hr tablet Take 50 mg by mouth 2 (two) times daily. Take 1 tablet 2 times daily  . Multiple Vitamins-Minerals (CENTRUM SILVER PO) Take by mouth 2 (two) times daily.  Marland Kitchen NIACIN PO Take 500 mg by mouth 2 (two) times daily. Take 500 mg two times daily  . omega-3 acid ethyl esters (LOVAZA) 1 G capsule Take by mouth 2 (two) times daily.  . Potassium Chloride (KLOR-CON 10 PO) Take by mouth daily. Extended release  . rosuvastatin (CRESTOR) 20 MG tablet Take 20 mg by mouth daily.  Marland Kitchen spironolactone (ALDACTONE) 100 MG tablet Take 100 mg by mouth daily.  . temazepam (RESTORIL) 15 MG capsule Take 15 mg by mouth at bedtime as needed for sleep.  Marland Kitchen tiotropium (SPIRIVA HANDIHALER) 18 MCG inhalation capsule Place 18 mcg into inhaler and inhale daily.  Marland Kitchen torsemide (DEMADEX) 20 MG tablet Take 20 mg by mouth 2 (two) times daily.  Marland Kitchen warfarin (COUMADIN) 3 MG tablet Take 3 mg by mouth daily.  . [DISCONTINUED] Fluticasone-Salmeterol (ADVAIR DISKUS) 500-50 MCG/DOSE AEPB Inhale 1 puff into the lungs 2 (two) times daily.  . Fluticasone-Salmeterol (ADVAIR) 250-50 MCG/DOSE AEPB Inhale 1 puff into the lungs every 12 (twelve) hours.   No facility-administered encounter medications on file as of 01/11/2015.    Allergies  Allergen Reactions  . Codeine Anaphylaxis    Tightness in chest    Immunization History  Administered Date(s) Administered  . Influenza-Unspecified 04/14/2014  . Pneumococcal Conjugate-13 04/14/2014    Current Medications, Allergies, Past Medical History, Past Surgical History, Family History, and Social History were reviewed in [facility] record.   Review of Systems            All symptoms NEG except where BOLDED >>  Constitutional:  F/C/S, fatigue, anorexia, unexpected weight  change. HEENT:  HA, visual changes, hearing loss, earache, nasal symptoms, sore throat, mouth sores, hoarseness. Resp:  cough, sputum, hemoptysis; SOB, tightness, wheezing. Cardio:  CP, palpit, DOE, orthopnea, edema. GI:  N/V/D/C, blood in stool; reflux, abd pain, distention, gas. GU:  dysuria, freq, urgency, hematuria, flank pain, voiding difficulty. MS:  joint pain, swelling, tenderness, decr ROM; neck pain, back pain, etc. Neuro:  HA, tremors, seizures, dizziness, syncope, weakness, numbness, gait abn. Skin:  suspicious lesions or skin rash. Heme:  adenopathy, bruising, bleeding. Psyche:  confusion, agitation, sleep disturbance, hallucinations, anxiety, depression suicidal.   Objective:   Physical Exam      Vital Signs:  Reviewed...  General:  WD, obese, 69 y/o WM in NAD; alert & oriented; pleasant & cooperative... HEENT:  [place]/AT; Conjunctiva- pink, Sclera- nonicteric, EOM-wnl, PERRLA, EACs-clear, TMs-wnl; NOSE-clear; THROAT-clear & wnl. Neck:  Supple w/ decr ROM; no JVD; normal carotid impulses w/o bruits; no thyromegaly or nodules palpated; no lymphadenopathy. Chest:  decr BS at bases but no wheezing, rales, rhonchi, or signs of consolidation... Heart:  Irreg, AFib, Gr1/6 SEM w/o rubs or gallops detected... Abdomen:  Soft & nontender- no guarding or rebound; normal bowel sounds; no organomegaly or masses palpated. Ext:  decrROM; +arthritic changes; no varicose veins, +venous insuffic, 1+ edema;  Pulses intact w/o bruits. Neuro:  No focal neuro deficits,gait normal & balance OK. Derm:  No lesions noted; no rash etc. Lymph:  No cervical, supraclavicular, axillary, or inguinal adenopathy palpated.   Assessment:      IMP >>  1) Moderate airflow obstruction & GOLD Stage3 COPD -- Rx w/ ADVAIR500Bid, SPIRIVA daily, PROAIR  rescue prn; plus MUCINEX, fluids, etc... 2) OSA- seems to be well controlled on CPAP17 -- we will try to get data from [facility] including download data re: usage & O2sats.Marland KitchenMarland Kitchen  3) Severe deconditioning -- he hasn't moved in >71yr but remembers how good he felt during cardiac rehab in 2001; may need formal program, for now just start moving (eg. Silver sneakers, etc). 4) Obesity  CARDS -- HBP, CAD s/pCABG, permanent AFib, chronic systolic CHF, etc... [facility] issues -- DM on insulin, Renal insuffic w/ Cr=1.6 range, HL, arthritis, etc...     PLAN >>  MBrelanis improved on the Advair500, Spiriva, Proair (hasn't used this in the last month); OK to wean to the Advair250Bid... Diet & exercise are critically important here & we reviewed this...  We will [place] to [place] rehab [place] and we will try again to get [place]ology [place] ROV in [id number] if needed for any problems...     Plan:     Patient's Medications  New Prescriptions   FLUTICASONE-SALMETEROL (ADVAIR) 250-50 MCG/DOSE AEPB    Inhale 1 puff into the lungs every 12 (twelve) hours.  Previous Medications   ACCU-CHEK AVIVA PLUS TEST STRIP       ALBUTEROL (PROVENTIL HFA;VENTOLIN HFA) 108 (90 BASE) MCG/ACT INHALER    Inhale 2 puffs into the lungs every 4 (four) hours as needed for wheezing or shortness of breath. Inhale one to two puffs by mouth as needed   ALLOPURINOL (ZYLOPRIM) 100 MG TABLET    Take 100 mg by mouth 2 (two) times daily.    ASPIRIN 81 MG TABLET    Take 81 mg by mouth daily.   BLOOD GLUCOSE MONITORING SUPPL (ONE TOUCH ULTRA 2) W/DEVICE KIT    by Does not apply route.   CHOLECALCIFEROL (VITAMIN D3) 2000  UNITS TABS    Take by mouth daily.   CLOPIDOGREL (PLAVIX) 75 MG TABLET    Take 75 mg by mouth daily.   DILTIAZEM (CARDIZEM) 120 MG TABLET    Take 120 mg by mouth daily.   FOLIC ACID (FOLVITE) 626 MCG TABLET    Take 400 mcg by mouth daily.   INSULIN  DETEMIR (LEVEMIR) 100 UNIT/ML INJECTION    Inject 80 Units into the skin daily.   MELATONIN 5 MG TABS    Take by mouth daily.   METOPROLOL SUCCINATE (TOPROL-XL) 50 MG 24 HR TABLET    Take 50 mg by mouth 2 (two) times daily. Take 1 tablet 2 times daily   MULTIPLE VITAMINS-MINERALS (CENTRUM SILVER PO)    Take by mouth 2 (two) times daily.   NIACIN PO    Take 500 mg by mouth 2 (two) times daily. Take 500 mg two times daily   OMEGA-3 ACID ETHYL ESTERS (LOVAZA) 1 G CAPSULE    Take by mouth 2 (two) times daily.   POTASSIUM CHLORIDE (KLOR-CON 10 PO)    Take by mouth daily. Extended release   ROSUVASTATIN (CRESTOR) 20 MG TABLET    Take 20 mg by mouth daily.   SPIRONOLACTONE (ALDACTONE) 100 MG TABLET    Take 100 mg by mouth daily.   TEMAZEPAM (RESTORIL) 15 MG CAPSULE    Take 15 mg by mouth at bedtime as needed for sleep.   TIOTROPIUM (SPIRIVA HANDIHALER) 18 MCG INHALATION CAPSULE    Place 18 mcg into inhaler and inhale daily.   TORSEMIDE (DEMADEX) 20 MG TABLET    Take 20 mg by mouth 2 (two) times daily.   WARFARIN (COUMADIN) 3 MG TABLET    Take 3 mg by mouth daily.  Modified Medications   No medications on file  Discontinued Medications   FLUTICASONE-SALMETEROL (ADVAIR DISKUS) 500-50 MCG/DOSE AEPB    Inhale 1 puff into the lungs 2 (two) times daily.

## 2015-04-18 ENCOUNTER — Encounter: Payer: Self-pay | Admitting: Pulmonary Disease

## 2015-04-18 ENCOUNTER — Ambulatory Visit (INDEPENDENT_AMBULATORY_CARE_PROVIDER_SITE_OTHER): Payer: Medicare FFS | Admitting: Pulmonary Disease

## 2015-04-18 VITALS — BP 128/64 | HR 99 | Temp 98.6°F | Ht 71.0 in | Wt 282.0 lb

## 2015-04-18 DIAGNOSIS — I482 Chronic atrial fibrillation, unspecified: Secondary | ICD-10-CM

## 2015-04-18 DIAGNOSIS — Z794 Long term (current) use of insulin: Secondary | ICD-10-CM

## 2015-04-18 DIAGNOSIS — J387 Other diseases of larynx: Secondary | ICD-10-CM

## 2015-04-18 DIAGNOSIS — K219 Gastro-esophageal reflux disease without esophagitis: Secondary | ICD-10-CM | POA: Diagnosis not present

## 2015-04-18 DIAGNOSIS — J4540 Moderate persistent asthma, uncomplicated: Secondary | ICD-10-CM | POA: Diagnosis not present

## 2015-04-18 DIAGNOSIS — G4733 Obstructive sleep apnea (adult) (pediatric): Secondary | ICD-10-CM

## 2015-04-18 DIAGNOSIS — E118 Type 2 diabetes mellitus with unspecified complications: Secondary | ICD-10-CM

## 2015-04-18 DIAGNOSIS — Z9989 Dependence on other enabling machines and devices: Secondary | ICD-10-CM

## 2015-04-18 DIAGNOSIS — J449 Chronic obstructive pulmonary disease, unspecified: Secondary | ICD-10-CM

## 2015-04-18 DIAGNOSIS — I2510 Atherosclerotic heart disease of native coronary artery without angina pectoris: Secondary | ICD-10-CM

## 2015-04-18 DIAGNOSIS — I10 Essential (primary) hypertension: Secondary | ICD-10-CM

## 2015-04-18 DIAGNOSIS — I5022 Chronic systolic (congestive) heart failure: Secondary | ICD-10-CM

## 2015-04-18 DIAGNOSIS — E669 Obesity, unspecified: Secondary | ICD-10-CM

## 2015-04-18 MED ORDER — TIOTROPIUM BROMIDE MONOHYDRATE 18 MCG IN CAPS: 18.0000 ug | ORAL_CAPSULE | Freq: Every day | RESPIRATORY_TRACT | Status: DC

## 2015-04-18 NOTE — Patient Instructions (Signed)
Today we updated your med list in our EPIC system...    Continue your current medications the same...  Triston-- further improvement in your breathing is dependent on DIET/ EXERCISE/ WEIGHT REDUCTION...  Call for any questions...  Let's plan a follow up visit in 25mo, sooner if needed for problems.Marland KitchenMarland Kitchen

## 2015-04-18 NOTE — Progress Notes (Signed)
Subjective:     Patient ID: Tommy Riley, male   DOB: July 14, 1945, 69 y.o.   MRN: [id number]  HPI ~  December 14, 2014:  Initial consult w/ SN>   26 y/o WM, from [place], self referred for a pulmonary second opinion>  His local PCP is Tommy Riley, his Cardiologist is Tommy Riley, and his Pulmonologist is Tommy Riley;  He has multisystem disease w/ OSA, obesity, and COPD; plus HBP, CAD- s/pCABG, AFib, Hyperlipidemia, DM, renal insuffic, Hx stroke, and arthritis...  From the pulmonary standpoint- he has OSA & on CPAP x yrs, doing satis w/ his machine & mask, rests well & denies daytime sleepiness issues;  He is currently on 3 inhalers> Pulmicort-2spBid, Spiriva daily, and Proair rescue inhaler but having to use this 3-4 times daily; he was prev on Symbicort160-2spBid but this was stopped after a Neuro eval by Tommy Riley who thought his LABA was contributing to the shaking & the tremor has improved off the Formoterol (interesting that it has NOT returned w/ incr use of the Albuterol in Proair);  He is c/o increased SOB & feels it is getting worse, specifically notes DOE w/ min activity & ADLs like showering, dressing, min work about the house; on further questioning it is apparent that his increased DOE has paralleled his increased sedentary lifestyle; he tells me he was Dx w/ CAD & had a 3 vessel CABG in 2001 after which he participated in cardiac rehab & he was apparently doing quite well at that time but developed AFib, had several TIAs, and retired on disability in 2003; since then he has had an increasingly sedentary lifestyle with progressive DOE despite his cardiac & pulmonary meds...       PULM> Tommy Riley is an ex-smoker, having started in his teens and smoked for 40+yrs up to 1-2ppd in his adult life, and quit in 2012 at age 34 or so (I est a 59 pack-year history);  He notes SOB as above- plus cough, small amt of thick beige sputum that is hard to expectorate, no hemoptysis & no CP; he related a hx of one prev  bronchoscopy w/ lavage ~10 yrs ago due to "thick phlegm & plugs that had to be sucked out";  He denies hx of asthma in the past but he says he's had pneumonia x3 in the past including double pneumonia when he was in the service in [place]; he says he was first diagnosed w/ COPD about 3 yrs ago... 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 FamHx is neg for any respiratory illnesses... We have requested old records from [facility] including prev CXRs, CT Chest, FullPFTs, ABGs, etc... Current Meds> Pulmicort-2spBid, Spiriva daily, and Proair rescue inhaler but having to use this 3-4 times daily.      CARDS> He has a hx of mult coronary risk factors- HBP, DM, HL; he had ?MI 2001 w/ subseq three vessel CABG; he developed AFib & had several cardioversions w/o success;  Records are avail in [place] from [place] in 2011- he had EP studies and pulmonary vein isolation procedure which was transiently successful but the AFib recurred shortly thereafter (his fatigue & decr exercise tolerance where attributed to the AFib); a cardiac MRI at that time is said to show an EF=45-50% w/ marked LAE & no thrombus, but evid for mult subendocardial infarcts in all  3 coronary art distributions; he didn't tol Tykosyn & was restarted on Amio... We have requested records from [facility] including his most recent 2DEcho (pt says his heart was at 37%) & and cath data.  Current Meds> MetopER50Bid, Cardizem120, Demadex20Qam (extra prn), Aldactone100, ?K20, ASA/ Plavix/ Coumadin...      EXAM showed Afeb, VSS, O2sat=95% on RA; he is obese- 6' Tall, 275#, BMI=37;  HEENT- short/ heavy neck, mallampati3;  Chest- decr BS w/ few scat rhonchi at bases, no wheezing or rales;  Heart- irreg gr1/6 SEM w/o r/g;  Abd- obese, soft, neg;  Ext- TEDs, 1+edema  CXR 12/14/14 showed cardiomegaly,  prior CABG, clear lungs except for some bibasilar scarring, NAD...   Spirometry 12/14/14 showed FVC=2.73 (56%), FEV1=1.63 (43%), %1sec=60, mid-flows are reduced at 24% predicted; this is c/w mod airflow obstruction and GOLD Stage3 COPD...   Ambulatory oxygen saturation test 12/2014> O2sat=95% on RA at rest w/ pulse=97/min;  He walked 2 laps in out office w/ lowest O2sat=93% w/ pulse=111/min & stopped due to SOB & fatigue.  LABS 12/2014:  Chems- ok x BS=141, BUN=46, Cr=1.6;  CBC- wnl w/ Hg=16.8;  BNP=72;  Sed=20;  TSH=1.37       IMP/PLAN>>   We took extra time to explain the multifactorial nature of his dyspnea- pulm/ cardiac/ deconditioning;  From the pulmonary standpoint we decided to change the Pulmicort to ADVAIR500-Bid, continue the [facility] daily, continue the [facility] rescue inhaler but hopefully he will be able to decr it's use;  He can also use MUCINEX 639mQid w/ fluids for the thick phlegm;  From the cardiac perspective we have requested records from [place](esp the most recent 2DEcho to check EF & PA pressure estimates), note that his BNP is norm at 72;  Finally it is imperative that he starts back into a slow gradual exercise program (eg- Silver Sneakers etc) & we discussed this in detail... I have asked him to ret in 4-6wks for recheck & hopefully we will have his old records to review.    ~  January 11, 2015:  154moOV w/ SN>  [name] that he is feeling better on the ADVAIR500Bid, Spiriva daily, & Proair rescue (in fact he hasn't used the [employer] his last visit!);  We have not received any records from [place] Cardiology team [place] were esp interested in his 2DEcho results)- we will request these again;  He has inquired at his local hosp but they no longer offer pulm rehab program, however [facility] and we will call to arrange this for him... Unfortunately [name] gained a few lbs but admits to not  taking the diet & exercise prescription seriously enough- he will re-double his efforts in this regard...      EXAM reveals Afeb, VSS, O2sat=92% on RA, wt=288#, 5'11" Tall, BMI=40;  HEENT- short neck, mallampati3;  Chest- decr BS at bases otherw clear;  Heart- irreg, gr1/6 SEM, w/o r/g;  Abd- obese, soft, neg;  Ext- TEDs, 1+edema...       IMP/PLAN>>  MaSkyys improved on the Advair500, Spiriva, Proair (hasn't used this in the last month); OK to wean to the Avdair250Bid... Diet & exercise are critically important here & we reviewed this...  We will refer to pulm rehab [place] again to get [place].    ~  April 18, 2015:  45m545moV w/  SN>  Wil returns & reports a good interval- stable on his Advair250, Spiriva, ProventilHFA rescue (using this <1/mo);  His wt is down 5# to 282# today but wife notes still not dieting satis & not exercising regularly;  Recall his hx of HBP, CAD, s/pCABG so diet & exercise are critical for this system as well...      OSA on CPAP> he reports on CPAP for yrs, ?set at 37?, doing satis by his description, we don't have data from his doctors in [place]...      COPD, ex-smoker> on Advair250Bid, spiriva daily, ProventilHFA rescue prn; he quit smoking in 2012; Spirometry 6/16 showed mod airflow obstruction w/ GOLD Stage3 COPD w/ FEV1=1.63 (43%); CXR w/ cardiomeg, prev CABG, clear lungs w/ some scarring at bases.      Dyspnea, Severe deconditioning> he hasn't take the advise to start exercise program so far; we reviewed this again!      HBP, CAD, s/pCABG, Hx stroke, AFib on coumadin> pt reports EF=37%> on ASA81, Plavix75, MetopER50Bid, Cardizem120, Demadex20Bid, Aldactone100, & Coumadin (?triple antiplat/ anticoag therapy?); followed by Cards in [place] we never received their records to review...       HL, DM on insulin, Obesity, RI> on Cres20, Niacin500bid, Lovaza, Levemir insulin (80u daily), Allopurinol100;  he tells me that his last A1c was 6.8 but we do not have records from [place]; Cr here 6/16 was 1.6...  We reviewed prob list, meds, xrays and labs>  IMP/PLAN>>  Ly is stable on above therapy; I would love for him to get medical records from his [place] team for [place] to review; we had a frank conversation indicating that further improvement is totally dependent on diet/ exercise/ & wt reduction- he understands; we plan ROV in 59mo, sooner if needed prn...     Past Medical History  Diagnosis Date  . Heart disease  >> on MetoprololER, Cardizem, Demadex, Aldactone, ?KCl, off prev Lotensin     Atherosclerotic  . Atrial fibrillation >> on ASA, Plavix, Coumadin (?triple antiplat/anticoag therapy?- Cards in [place])   . Hypertension   . Diabetes mellitus without complication >> on Levemir insulin   . Atrial fibrillation   . Stroke 2003  . Kidney disease     stage 3  . High cholesterol >> on Crestor, Lovaza, Niacin   . Depression   . COPD (chronic obstructive pulmonary disease)   . OSA (obstructive sleep apnea)     Past Surgical History  Procedure Laterality Date  . Coronary artery bypass graft    . Triple by pass  09/16/1999  . Heart ablation  2011    Outpatient Encounter Prescriptions as of 04/18/2015  Medication Sig  . ACCU-CHEK AVIVA PLUS test strip   . albuterol (PROVENTIL HFA;VENTOLIN HFA) 108 (90 BASE) MCG/ACT inhaler Inhale 2 puffs into the lungs every 4 (four) hours as needed for wheezing or shortness of breath. Inhale one to two puffs by mouth as needed  . allopurinol (ZYLOPRIM) 100 MG tablet Take 100 mg by mouth 2 (two) times daily.   Marland Kitchen aspirin 81 MG tablet Take 81 mg by mouth daily.  . Blood Glucose Monitoring Suppl (ONE TOUCH ULTRA 2) W/DEVICE KIT by Does not apply route.  . Cholecalciferol (VITAMIN D3) 2000 UNITS TABS Take by mouth daily.  . clopidogrel (PLAVIX) 75 MG tablet Take 75 mg by mouth daily.  Marland Kitchen diltiazem (CARDIZEM) 120 MG tablet Take 120 mg by mouth daily.    . Fluticasone-Salmeterol (ADVAIR) 250-50 MCG/DOSE AEPB Inhale 1 puff  into the lungs every 12 (twelve) hours.  . folic acid (FOLVITE) 291 MCG tablet Take 400 mcg by mouth daily.  . insulin detemir (LEVEMIR) 100 UNIT/ML injection Inject 80 Units into the skin daily.  . Melatonin 5 MG TABS Take by mouth daily.  . metoprolol succinate (TOPROL-XL) 50 MG 24 hr tablet Take 50 mg by mouth 2 (two) times daily. Take 1 tablet 2 times daily  . Multiple Vitamins-Minerals (CENTRUM SILVER PO) Take by mouth 2 (two) times daily.  Marland Kitchen NIACIN PO Take 500 mg by mouth 2 (two) times daily. Take 500 mg two times daily  . omega-3 acid ethyl esters (LOVAZA) 1 G capsule Take by mouth 2 (two) times daily.  . Potassium Chloride (KLOR-CON 10 PO) Take by mouth daily. Extended release  . spironolactone (ALDACTONE) 100 MG tablet Take 100 mg by mouth daily.  . temazepam (RESTORIL) 15 MG capsule Take 15 mg by mouth at bedtime as needed for sleep.  Marland Kitchen tiotropium (SPIRIVA HANDIHALER) 18 MCG inhalation capsule Place 1 capsule (18 mcg total) into inhaler and inhale daily.  Marland Kitchen tiotropium (SPIRIVA HANDIHALER) 18 MCG inhalation capsule Place 1 capsule (18 mcg total) into inhaler and inhale daily.  Marland Kitchen torsemide (DEMADEX) 20 MG tablet Take 20 mg by mouth 2 (two) times daily.  Marland Kitchen warfarin (COUMADIN) 3 MG tablet Take 3 mg by mouth daily.  . [DISCONTINUED] tiotropium (SPIRIVA HANDIHALER) 18 MCG inhalation capsule Place 18 mcg into inhaler and inhale daily.  . [DISCONTINUED] rosuvastatin (CRESTOR) 20 MG tablet Take 20 mg by mouth daily.   No facility-administered encounter medications on file as of 04/18/2015.    Allergies  Allergen Reactions  . Codeine Anaphylaxis    Tightness in chest    Immunization History  Administered Date(s) Administered  . Influenza-Unspecified 04/14/2014  . Pneumococcal Conjugate-13 04/14/2014    Current Medications, Allergies, Past Medical History, Past Surgical History, Family History, and Social History  were reviewed in [employer] record.   Review of Systems            All symptoms NEG except where BOLDED >>  Constitutional:  F/C/S, fatigue, anorexia, unexpected weight change. HEENT:  HA, visual changes, hearing loss, earache, nasal symptoms, sore throat, mouth sores, hoarseness. Resp:  cough, sputum, hemoptysis; SOB, tightness, wheezing. Cardio:  CP, palpit, DOE, orthopnea, edema. GI:  N/V/D/C, blood in stool; reflux, abd pain, distention, gas. GU:  dysuria, freq, urgency, hematuria, flank pain, voiding difficulty. MS:  joint pain, swelling, tenderness, decr ROM; neck pain, back pain, etc. Neuro:  HA, tremors, seizures, dizziness, syncope, weakness, numbness, gait abn. Skin:  suspicious lesions or skin rash. Heme:  adenopathy, bruising, bleeding. Psyche:  confusion, agitation, sleep disturbance, hallucinations, anxiety, depression suicidal.   Objective:   Physical Exam      Vital Signs:  Reviewed...  General:  WD, obese, 69 y/o WM in NAD; alert & oriented; pleasant & cooperative... HEENT:  [place] 1/AT; Conjunctiva- pink, Sclera- nonicteric, EOM-wnl, PERRLA, EACs-clear, TMs-wnl; NOSE-clear; THROAT-clear & wnl. Neck:  Supple w/ decr ROM; no JVD; normal carotid impulses w/o bruits; no thyromegaly or nodules palpated; no lymphadenopathy. Chest:  decr BS at bases but no wheezing, rales, rhonchi, or signs of consolidation... Heart:  Irreg, AFib, Gr1/6 SEM w/o rubs or gallops detected... Abdomen:  Soft & nontender- no guarding or rebound; normal bowel sounds; no organomegaly or masses palpated. Ext:  decrROM; +arthritic changes; no varicose veins, +venous insuffic, 1+ edema;  Pulses intact w/o bruits. Neuro:  No focal neuro deficits,gait  normal & balance OK. Derm:  No lesions noted; no rash etc. Lymph:  No cervical, supraclavicular, axillary, or inguinal adenopathy palpated.   Assessment:      IMP >>       OSA on CPAP> he reports on CPAP for yrs, ?set at 3?, doing  satis by his description, we don't have data from his doctors in [place]...      COPD, ex-smoker> on Advair250Bid, Spiriva daily, ProventilHFA rescue prn; he quit smoking in 2012; Spirometry 6/16 showed mod airflow obstruction w/ GOLD Stage3 COPD w/ FEV1=1.63 (43%); CXR w/ cardiomeg, prev CABG, clear lungs w/ some scarring at bases.      Dyspnea, Severe deconditioning> he hasn't take the advise to start exercise program so far; we reviewed this again!      HBP, CAD, s/pCABG, Hx stroke, AFib on coumadin> pt reports EF=37%> on ASA81, Plavix75, MetopER50Bid, Cardizem120, Demadex20Bid, Aldactone100, & Coumadin (?triple antiplat/ anticoag therapy?); followed by Cards in [place] we never received their records to review...       HL, DM on insulin, Obesity, RI> on Cres20, Niacin500bid, Lovaza, Levemir insulin (80u daily), Allopurinol100; he tells me that his last A1c was 6.8 but we do not have records from [place]; Cr here 6/16 was 1.6...   PLAN >>        Hulon is stable on above therapy; I would love for him to get medical records from his [place] team for [place] to review; we had a frank conversation indicating that further improvement is totally dependent on diet/ exercise/ & wt reduction- he understands; we plan ROV in 75mo, sooner if needed prn...      Plan:     Patient's Medications  New Prescriptions   No medications on file  Previous Medications   ACCU-CHEK AVIVA PLUS TEST STRIP       ALBUTEROL (PROVENTIL HFA;VENTOLIN HFA) 108 (90 BASE) MCG/ACT INHALER    Inhale 2 puffs into the lungs every 4 (four) hours as needed for wheezing or shortness of breath. Inhale one to two puffs by mouth as needed   ALLOPURINOL (ZYLOPRIM) 100 MG TABLET    Take 100 mg by mouth 2 (two) times daily.    ASPIRIN 81 MG TABLET    Take 81 mg by mouth daily.   BLOOD GLUCOSE MONITORING SUPPL (ONE TOUCH ULTRA 2) W/DEVICE KIT    by Does not apply route.   CHOLECALCIFEROL (VITAMIN D3) 2000 UNITS TABS    Take by mouth daily.     CLOPIDOGREL (PLAVIX) 75 MG TABLET    Take 75 mg by mouth daily.   DILTIAZEM (CARDIZEM) 120 MG TABLET    Take 120 mg by mouth daily.   FLUTICASONE-SALMETEROL (ADVAIR) 250-50 MCG/DOSE AEPB    Inhale 1 puff into the lungs every 12 (twelve) hours.   FOLIC ACID (FOLVITE) 914 MCG TABLET    Take 400 mcg by mouth daily.   INSULIN DETEMIR (LEVEMIR) 100 UNIT/ML INJECTION    Inject 80 Units into the skin daily.   MELATONIN 5 MG TABS    Take by mouth daily.   METOPROLOL SUCCINATE (TOPROL-XL) 50 MG 24 HR TABLET    Take 50 mg by mouth 2 (two) times daily. Take 1 tablet 2 times daily   MULTIPLE VITAMINS-MINERALS (CENTRUM SILVER PO)    Take by mouth 2 (two) times daily.   NIACIN PO    Take 500 mg by mouth 2 (two) times daily. Take 500 mg two times daily   OMEGA-3 ACID ETHYL  ESTERS (LOVAZA) 1 G CAPSULE    Take by mouth 2 (two) times daily.   POTASSIUM CHLORIDE (KLOR-CON 10 PO)    Take by mouth daily. Extended release   SPIRONOLACTONE (ALDACTONE) 100 MG TABLET    Take 100 mg by mouth daily.   TEMAZEPAM (RESTORIL) 15 MG CAPSULE    Take 15 mg by mouth at bedtime as needed for sleep.   TORSEMIDE (DEMADEX) 20 MG TABLET    Take 20 mg by mouth 2 (two) times daily.   WARFARIN (COUMADIN) 3 MG TABLET    Take 3 mg by mouth daily.  Modified Medications   Modified Medication Previous Medication   TIOTROPIUM (SPIRIVA HANDIHALER) 18 MCG INHALATION CAPSULE tiotropium (SPIRIVA HANDIHALER) 18 MCG inhalation capsule      Place 1 capsule (18 mcg total) into inhaler and inhale daily.    Place 18 mcg into inhaler and inhale daily.   TIOTROPIUM (SPIRIVA HANDIHALER) 18 MCG INHALATION CAPSULE tiotropium (SPIRIVA HANDIHALER) 18 MCG inhalation capsule      Place 1 capsule (18 mcg total) into inhaler and inhale daily.    Place 18 mcg into inhaler and inhale daily.  Discontinued Medications   ROSUVASTATIN (CRESTOR) 20 MG TABLET    Take 20 mg by mouth daily.

## 2015-04-19 ENCOUNTER — Telehealth: Payer: Self-pay | Admitting: Pulmonary Disease

## 2015-04-19 DIAGNOSIS — J449 Chronic obstructive pulmonary disease, unspecified: Secondary | ICD-10-CM

## 2015-04-19 DIAGNOSIS — I2510 Atherosclerotic heart disease of native coronary artery without angina pectoris: Secondary | ICD-10-CM

## 2015-04-19 MED ORDER — TIOTROPIUM BROMIDE MONOHYDRATE 18 MCG IN CAPS: 18.0000 ug | ORAL_CAPSULE | Freq: Every day | RESPIRATORY_TRACT | Status: DC

## 2015-04-19 NOTE — Telephone Encounter (Signed)
Spoke with pt. States that when he was here yesterday his rx for Spiriva was given to him with no refills. He does not want to have to call us every month for refills. Requests that we send this prescription to [facility] in [place], [place]. This has been sent in. Nothing further was needed.

## 2015-10-17 ENCOUNTER — Ambulatory Visit (INDEPENDENT_AMBULATORY_CARE_PROVIDER_SITE_OTHER): Payer: Medicare FFS | Admitting: Pulmonary Disease

## 2015-10-17 ENCOUNTER — Encounter: Payer: Self-pay | Admitting: Pulmonary Disease

## 2015-10-17 VITALS — BP 110/68 | HR 81 | Temp 97.5°F | Ht 71.0 in | Wt 278.8 lb

## 2015-10-17 DIAGNOSIS — I5022 Chronic systolic (congestive) heart failure: Secondary | ICD-10-CM

## 2015-10-17 DIAGNOSIS — J449 Chronic obstructive pulmonary disease, unspecified: Secondary | ICD-10-CM

## 2015-10-17 DIAGNOSIS — G4733 Obstructive sleep apnea (adult) (pediatric): Secondary | ICD-10-CM | POA: Diagnosis not present

## 2015-10-17 DIAGNOSIS — Z951 Presence of aortocoronary bypass graft: Secondary | ICD-10-CM | POA: Diagnosis not present

## 2015-10-17 DIAGNOSIS — I2510 Atherosclerotic heart disease of native coronary artery without angina pectoris: Secondary | ICD-10-CM

## 2015-10-17 DIAGNOSIS — Z9989 Dependence on other enabling machines and devices: Secondary | ICD-10-CM

## 2015-10-17 NOTE — Patient Instructions (Signed)
Today we updated your med list in our EPIC system...    Continue your current medications the same...  Keep up the good work w/ DIET & EXERCISE...  Call for any questions...  Let's plan a follow up visit in 6mo, sooner if needed for problems...   

## 2015-10-18 ENCOUNTER — Encounter: Payer: Self-pay | Admitting: Pulmonary Disease

## 2015-10-18 NOTE — Progress Notes (Signed)
Subjective:     Patient ID: Tommy Riley, male   DOB: July 18, 1945, 70 y.o.   MRN: [id number]  HPI ~  December 14, 2014:  Initial consult w/ SN>   59 y/o WM, from [place], self referred for a pulmonary second opinion>  His local PCP is DrPomposini, his Cardiologist is Secretary/administrator, and his Pulmonologist is DrKBird;  He has multisystem disease w/ OSA, obesity, and COPD; plus HBP, CAD- s/pCABG, AFib, Hyperlipidemia, DM, renal insuffic, Hx stroke, and arthritis...  From the pulmonary standpoint- he has OSA & on CPAP x yrs, doing satis w/ his machine & mask, rests well & denies daytime sleepiness issues;  He is currently on 3 inhalers> Pulmicort-2spBid, Spiriva daily, and Proair rescue inhaler but having to use this 3-4 times daily; he was prev on Symbicort160-2spBid but this was stopped after a Neuro eval by DrAthar who thought his LABA was contributing to the shaking & the tremor has improved off the Formoterol (interesting that it has NOT returned w/ incr use of the Albuterol in Proair);  He is c/o increased SOB & feels it is getting worse, specifically notes DOE w/ min activity & ADLs like showering, dressing, min work about the house; on further questioning it is apparent that his increased DOE has paralleled his increased sedentary lifestyle; he tells me he was Dx w/ CAD & had a 3 vessel CABG in 2001 after which he participated in cardiac rehab & he was apparently doing quite well at that time but developed AFib, had several TIAs, and retired on disability in 2003; since then he has had an increasingly sedentary lifestyle with progressive DOE despite his cardiac & pulmonary meds...       PULM> Tommy Riley is an ex-smoker, having started in his teens and smoked for 40+yrs up to 1-2ppd in his adult life, and quit in 2012 at age 81 or so (I est a 77 pack-year history);  He notes SOB as above- plus cough, small amt of thick beige sputum that is hard to expectorate, no hemoptysis & no CP; he related a hx of one prev  bronchoscopy w/ lavage ~10 yrs ago due to "thick phlegm & plugs that had to be sucked out";  He denies hx of asthma in the past but he says he's had pneumonia x3 in the past including double pneumonia when he was in the service in [place]; he says he was first diagnosed w/ COPD about 3 yrs ago... 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 FamHx is neg for any respiratory illnesses... We have requested old records from DrBird including prev CXRs, CT Chest, FullPFTs, ABGs, etc... Current Meds> Pulmicort-2spBid, Spiriva daily, and Proair rescue inhaler but having to use this 3-4 times daily.      CARDS> He has a hx of mult coronary risk factors- HBP, DM, HL; he had ?MI 2001 w/ subseq three vessel CABG; he developed AFib & had several cardioversions w/o success;  Records are avail in [place] from [place] in 2011- he had EP studies and pulmonary vein isolation procedure which was transiently successful but the AFib recurred shortly thereafter (his fatigue & decr exercise tolerance where attributed to the AFib); a cardiac MRI at that time is said to show an EF=45-50% w/ marked LAE & no thrombus, but evid for mult subendocardial infarcts in all  3 coronary art distributions; he didn't tol Tykosyn & was restarted on Amio... We have requested records from [facility] including his most recent 2DEcho (pt says his heart was at 37%) & and cath data.  Current Meds> MetopER50Bid, Cardizem120, Demadex20Qam (extra prn), Aldactone100, ?K20, ASA/ Plavix/ Coumadin...      EXAM showed Afeb, VSS, O2sat=95% on RA; he is obese- 6' Tall, 275#, BMI=37;  HEENT- short/ heavy neck, mallampati3;  Chest- decr BS w/ few scat rhonchi at bases, no wheezing or rales;  Heart- irreg gr1/6 SEM w/o r/g;  Abd- obese, soft, neg;  Ext- TEDs, 1+edema  CXR 12/14/14 showed cardiomegaly,  prior CABG, clear lungs except for some bibasilar scarring, NAD...   Spirometry 12/14/14 showed FVC=2.73 (56%), FEV1=1.63 (43%), %1sec=60, mid-flows are reduced at 24% predicted; this is c/w mod airflow obstruction and GOLD Stage3 COPD...   Ambulatory oxygen saturation test 12/2014> O2sat=95% on RA at rest w/ pulse=97/min;  He walked 2 laps in out office w/ lowest O2sat=93% w/ pulse=111/min & stopped due to SOB & fatigue.  LABS 12/2014:  Chems- ok x BS=141, BUN=46, Cr=1.6;  CBC- wnl w/ Hg=16.8;  BNP=72;  Sed=20;  TSH=1.37       IMP/PLAN>>   We took extra time to explain the multifactorial nature of his dyspnea- pulm/ cardiac/ deconditioning;  From the pulmonary standpoint we decided to change the Pulmicort to ADVAIR500-Bid, continue the [facility] daily, continue the [facility] rescue inhaler but hopefully he will be able to decr it's use;  He can also use MUCINEX 639mQid w/ fluids for the thick phlegm;  From the cardiac perspective we have [place](esp the most recent 2DEcho to check EF & PA pressure estimates), note that his BNP is norm at 72;  Finally it is imperative that he starts back into a slow gradual exercise program (eg- Silver Sneakers etc) & we discussed this in detail... I have asked him to ret in 4-6wks for recheck & hopefully we will have his old records to review.    ~  January 11, 2015:  154moOV w/ SN>  [name] that he is feeling better on the ADVAIR500Bid, Spiriva daily, & Proair rescue (in fact he hasn't used the PrIAC/InterActiveCorpince his last visit!);  We have not received any records from [place]iology [place] interested [place] 2[place] results)- we will request these again;  He has inquired at his local hosp but they no longer offer pulm rehab program, however [facility] and we will call to arrange this for him... Unfortunately Tommy Riley gained a few lbs but admits to not  taking the diet & exercise prescription seriously enough- he will re-double his efforts in this regard...      EXAM reveals Afeb, VSS, O2sat=92% on RA, wt=288#, 5'11" Tall, BMI=40;  HEENT- short neck, mallampati3;  Chest- decr BS at bases otherw clear;  Heart- irreg, gr1/6 SEM, w/o r/g;  Abd- obese, soft, neg;  Ext- TEDs, 1+edema...       IMP/PLAN>>  Tommy Riley improved on the Advair500, Spiriva, Proair (hasn't used this in the last month); OK to wean to the Avdair250Bid... Diet & exercise are critically important here & we reviewed this...  We will refer to pulm rehab via [place] we will [place] again to [place].    ~  April 18, 2015:  45m545moV w/  SN>  Arrington returns & reports a good interval- stable on his Advair250, Spiriva, ProventilHFA rescue (using this <1/mo);  His wt is down 5# to 282# today but wife notes still not dieting satis & not exercising regularly;  Recall his hx of HBP, CAD, s/pCABG so diet & exercise are critical for this system as well...      OSA on CPAP> he reports on CPAP for yrs, ?set at 28?, doing satis by his description, we don't have data from his doctors in [place]...      COPD, ex-smoker> on Advair250Bid, spiriva daily, ProventilHFA rescue prn; he quit smoking in 2012; Spirometry 6/16 showed mod airflow obstruction w/ GOLD Stage3 COPD w/ FEV1=1.63 (43%); CXR w/ cardiomeg, prev CABG, clear lungs w/ some scarring at bases.      Dyspnea, Severe deconditioning> he hasn't take the advise to start exercise program so far; we reviewed this again!      HBP, CAD, s/pCABG, Hx stroke, AFib on coumadin> pt reports EF=37%> on ASA81, Plavix75, MetopER50Bid, Cardizem120, Demadex20Bid, Aldactone100, & Coumadin (?triple antiplat/ anticoag therapy?); followed by Cards in [place] we never received their records to review...       HL, DM on insulin, Obesity, RI> on Cres20, Niacin500bid, Lovaza, Levemir insulin (80u daily), Allopurinol100;  he tells me that his last A1c was 6.8 but we do not have records from [place]; Cr here 6/16 was 1.6...  We reviewed prob list, meds, xrays and labs>  IMP/PLAN>>  Tommy Riley is stable on above therapy; I would love for him to get medical records from his [place] team for [place] to review; we had a frank conversation indicating that further improvement is totally dependent on diet/ exercise/ & wt reduction- he understands...   ~  October 17, 2015:  82moROV w/ SN>  MRuddyreports a good interval w/o new complaints or concerns- he is trying to eat less "no sweets" & in fact is down 13# to 279# today; breathing is unchanged, SOB at baseline & again asked to increase his exercise program;  He notes occas bouts w/ cough, chest congestion, and light gray mucus => we discussed ZPak for prn use...       OSA on CPAP> he reports on CPAP for yrs, ?set at 148, doing satis by his description, we don't have data [place]..      COPD, ex-smoker> on Advair250Bid, Spiriva daily, ProventilHFA rescue prn; he quit smoking in 2012; Spirometry 6/16 showed mod airflow obstruction w/ GOLD Stage3 COPD w/ FEV1=1.63 (43%); CXR w/ cardiomeg, prev CABG, clear lungs w/ some scarring at bases.      Dyspnea, Severe deconditioning> he hasn't take the advise to start exercise program so far; we reviewed this again!      HBP, CAD, s/pCABG, ischemic cardiomyopathy- EF=30%, Hx stroke, AFib on coumadin> pt reports EF=37%> on ASA81, Plavix75, MetopER50Bid, Cardizem120, Demadex20Bid, Aldactone100, & Coumadin (?triple antiplat/ anticoag therapy?); [place]...       HL, DM on insulin, Obesity, RI> off Cres20 & on Niacin500bid, Lovaza, Levemir insulin (80u daily), Allopurinol100; he tells me that his last A1c was 6.8 but [place] Cr [place] 6/16 was 1.6...  [facility] CKD- stage3 w/ Cr=1.20 and stable...    EXAM reveals Afeb, VSS, O2sat=96% on RA, wt=279#, 5'11" Tall, BMI=38;  HEENT- short neck, mallampati3;  Chest- decr BS at bases otherw  clear;  Heart- irreg, gr1/6 SEM, w/o r/g;  Abd- obese, soft, neg;  Ext- TEDs, tr edema...  IMP/PLAN>>  Tommy Riley is stable on his Advair250Bid & Spiriva daily; hasn't needed his Albut rescue inhaler at all; we discussed ZPak for prn use & Rx provided; he will check w/ his PCP regarding vaccination schedule to be sure he is up-to-date; he definitely needs to increase his exercise program & keep up the good work w/ wt reduction... We plan ROV in 62mo sooner if needed for any problems...    Past Medical History  Diagnosis Date  . Heart disease  >> on MetoprololER, Cardizem, Demadex, Aldactone, ?KCl, off prev Lotensin     Atherosclerotic  . Atrial fibrillation >> on ASA, Plavix, Coumadin (?triple antiplat/anticoag therapy?- [place]   . Hypertension   . Diabetes mellitus without complication >> on Levemir insulin   . Atrial fibrillation   . Stroke 2003  . Kidney disease     stage 3  . High cholesterol >> on Crestor, Lovaza, Niacin   . Depression   . COPD (chronic obstructive pulmonary disease)   . OSA (obstructive sleep apnea)     Past Surgical History  Procedure Laterality Date  . Coronary artery bypass graft    . Triple by pass  09/16/1999  . Heart ablation  2011    Outpatient Encounter Prescriptions as of 10/17/2015  Medication Sig  . ACCU-CHEK AVIVA PLUS test strip   . albuterol (PROVENTIL HFA;VENTOLIN HFA) 108 (90 BASE) MCG/ACT inhaler Inhale 2 puffs into the lungs every 4 (four) hours as needed for wheezing or shortness of breath. Inhale one to two puffs by mouth as needed  . allopurinol (ZYLOPRIM) 100 MG tablet Take 100 mg by mouth 2 (two) times daily.   .Marland Kitchenaspirin 81 MG tablet Take 81 mg by mouth daily.  . Blood Glucose Monitoring Suppl (ONE TOUCH ULTRA 2) W/DEVICE KIT by Does not apply route.  . Cholecalciferol (VITAMIN D3) 2000 UNITS  TABS Take by mouth daily.  . clopidogrel (PLAVIX) 75 MG tablet Take 75 mg by mouth daily.  .Marland Kitchendiltiazem (CARDIZEM) 120 MG tablet Take 120 mg by mouth daily.  . Fluticasone-Salmeterol (ADVAIR) 250-50 MCG/DOSE AEPB Inhale 1 puff into the lungs every 12 (twelve) hours.  . folic acid (FOLVITE) 4081MCG tablet Take 400 mcg by mouth daily.  . insulin detemir (LEVEMIR) 100 UNIT/ML injection Inject 80 Units into the skin daily.  . Melatonin 5 MG TABS Take by mouth daily.  . metoprolol succinate (TOPROL-XL) 50 MG 24 hr tablet Take 50 mg by mouth 2 (two) times daily. Take 1 tablet 2 times daily  . Multiple Vitamins-Minerals (CENTRUM SILVER PO) Take by mouth 2 (two) times daily.  .Marland KitchenNIACIN PO Take 500 mg by mouth 2 (two) times daily. Take 500 mg two times daily  . omega-3 acid ethyl esters (LOVAZA) 1 G capsule Take by mouth 2 (two) times daily.  . Potassium Chloride (KLOR-CON 10 PO) Take by mouth daily. Extended release  . spironolactone (ALDACTONE) 100 MG tablet Take 100 mg by mouth daily.  . temazepam (RESTORIL) 15 MG capsule Take 15 mg by mouth at bedtime as needed for sleep.  .Marland Kitchentiotropium (SPIRIVA HANDIHALER) 18 MCG inhalation capsule Place 1 capsule (18 mcg total) into inhaler and inhale daily.  .Marland Kitchentorsemide (DEMADEX) 20 MG tablet Take 20 mg by mouth 2 (two) times daily.  .Marland Kitchenwarfarin (COUMADIN) 3 MG tablet Take 3 mg by mouth daily.   No facility-administered  encounter medications on file as of 10/17/2015.    Allergies  Allergen Reactions  . Codeine Anaphylaxis    Tightness in chest    Immunization History  Administered Date(s) Administered  . Influenza,inj,Quad PF,36+ Mos 05/08/2015  . Influenza-Unspecified 04/14/2014  . Pneumococcal Conjugate-13 04/14/2014    Current Medications, Allergies, Past Medical History, Past Surgical History, Family History, and Social History were reviewed in [employer] record.   Review of Systems            All symptoms NEG except where  BOLDED >>  Constitutional:  F/C/S, fatigue, anorexia, unexpected weight change. HEENT:  HA, visual changes, hearing loss, earache, nasal symptoms, sore throat, mouth sores, hoarseness. Resp:  cough, sputum, hemoptysis; SOB, tightness, wheezing. Cardio:  CP, palpit, DOE, orthopnea, edema. GI:  N/V/D/C, blood in stool; reflux, abd pain, distention, gas. GU:  dysuria, freq, urgency, hematuria, flank pain, voiding difficulty. MS:  joint pain, swelling, tenderness, decr ROM; neck pain, back pain, etc. Neuro:  HA, tremors, seizures, dizziness, syncope, weakness, numbness, gait abn. Skin:  suspicious lesions or skin rash. Heme:  adenopathy, bruising, bleeding. Psyche:  confusion, agitation, sleep disturbance, hallucinations, anxiety, depression suicidal.   Objective:   Physical Exam      Vital Signs:  Reviewed...  General:  WD, obese, 70 y/o WM in NAD; alert & oriented; pleasant & cooperative... HEENT:  [place]/AT; Conjunctiva- pink, Sclera- nonicteric, EOM-wnl, PERRLA, EACs-clear, TMs-wnl; NOSE-clear; THROAT-clear & wnl. Neck:  Supple w/ decr ROM; no JVD; normal carotid impulses w/o bruits; no thyromegaly or nodules palpated; no lymphadenopathy. Chest:  decr BS at bases but no wheezing, rales, rhonchi, or signs of consolidation... Heart:  Irreg, AFib, Gr1/6 SEM w/o rubs or gallops detected... Abdomen:  Soft & nontender- no guarding or rebound; normal bowel sounds; no organomegaly or masses palpated. Ext:  decrROM; +arthritic changes; no varicose veins, +venous insuffic, 1+ edema;  Pulses intact w/o bruits. Neuro:  No focal neuro deficits,gait normal & balance OK. Derm:  No lesions noted; no rash etc. Lymph:  No cervical, supraclavicular, axillary, or inguinal adenopathy palpated.   Assessment:      IMP >>       OSA on CPAP> he reports on CPAP for yrs, ?set at 8?, doing satis by his description, we don't have data from his doctors in [place]...      COPD, ex-smoker> on Advair250Bid, Spiriva  daily, ProventilHFA rescue prn; he quit smoking in 2012; Spirometry 6/16 showed mod airflow obstruction w/ GOLD Stage3 COPD w/ FEV1=1.63 (43%); CXR w/ cardiomeg, prev CABG, clear lungs w/ some scarring at bases.      Dyspnea, Severe deconditioning> he hasn't take the advise to start exercise program so far; we reviewed this again!      HBP, CAD, s/pCABG, Hx stroke, AFib on coumadin> pt reports EF=37%> on ASA81, Plavix75, MetopER50Bid, Cardizem120, Demadex20Bid, Aldactone100, & Coumadin (?triple antiplat/ anticoag therapy?); followed by Cards in [place] we never received their records to review...       HL, DM on insulin, Obesity, RI> on Cres20, Niacin500bid, Lovaza, Levemir insulin (80u daily), Allopurinol100; he tells me that his last A1c was 6.8 but we do not have records from [place]; Cr here 6/16 was 1.6 & 1.2 w/ Nephrology 08/2015.   PLAN >>   04/18/15>   Tommy Riley is stable on above therapy; I would love for him to get medical records from his [place] team for [place] to review; we had a frank conversation indicating that further improvement is  totally dependent on diet/ exercise/ & wt reduction- he understands; we plan ROV in 27mo sooner if needed prn...  10/17/15>   Tommy Riley stable on his Advair250Bid & Spiriva daily; hasn't needed his Albut rescue inhaler at all; we discussed ZPak for prn use & Rx provided; he will check w/ his PCP regarding vaccination schedule to be sure he is up-to-date; he definitely needs to increase his exercise program 7 keep up the good work w/ wt reduction... We plan ROV in [place] if needed for any problems     Plan:     Patient's Medications  New Prescriptions   No medications on file  Previous Medications   ACCU-CHEK AVIVA PLUS TEST STRIP       ALBUTEROL (PROVENTIL HFA;VENTOLIN HFA) 108 (90 BASE) MCG/ACT INHALER    Inhale 2 puffs into the lungs every 4 (four) hours as needed for wheezing or shortness of breath. Inhale one to two puffs by mouth as needed    ALLOPURINOL (ZYLOPRIM) 100 MG TABLET    Take 100 mg by mouth 2 (two) times daily.    ASPIRIN 81 MG TABLET    Take 81 mg by mouth daily.   BLOOD GLUCOSE MONITORING SUPPL (ONE TOUCH ULTRA 2) W/DEVICE KIT    by Does not apply route.   CHOLECALCIFEROL (VITAMIN D3) 2000 UNITS TABS    Take by mouth daily.   CLOPIDOGREL (PLAVIX) 75 MG TABLET    Take 75 mg by mouth daily.   DILTIAZEM (CARDIZEM) 120 MG TABLET    Take 120 mg by mouth daily.   FLUTICASONE-SALMETEROL (ADVAIR) 250-50 MCG/DOSE AEPB    Inhale 1 puff into the lungs every 12 (twelve) hours.   FOLIC ACID (FOLVITE) 40062CG TABLET    Take 400 mcg by mouth daily.   INSULIN DETEMIR (LEVEMIR) 100 UNIT/ML INJECTION    Inject 80 Units into the skin daily.   MELATONIN 5 MG TABS    Take by mouth daily.   METOPROLOL SUCCINATE (TOPROL-XL) 50 MG 24 HR TABLET    Take 50 mg by mouth 2 (two) times daily. Take 1 tablet 2 times daily   MULTIPLE VITAMINS-MINERALS (CENTRUM SILVER PO)    Take by mouth 2 (two) times daily.   NIACIN PO    Take 500 mg by mouth 2 (two) times daily. Take 500 mg two times daily   OMEGA-3 ACID ETHYL ESTERS (LOVAZA) 1 G CAPSULE    Take by mouth 2 (two) times daily.   POTASSIUM CHLORIDE (KLOR-CON 10 PO)    Take by mouth daily. Extended release   SPIRONOLACTONE (ALDACTONE) 100 MG TABLET    Take 100 mg by mouth daily.   TEMAZEPAM (RESTORIL) 15 MG CAPSULE    Take 15 mg by mouth at bedtime as needed for sleep.   TIOTROPIUM (SPIRIVA HANDIHALER) 18 MCG INHALATION CAPSULE    Place 1 capsule (18 mcg total) into inhaler and inhale daily.   TORSEMIDE (DEMADEX) 20 MG TABLET    Take 20 mg by mouth 2 (two) times daily.   WARFARIN (COUMADIN) 3 MG TABLET    Take 3 mg by mouth daily.  Modified Medications   No medications on file  Discontinued Medications   No medications on file

## 2015-10-21 ENCOUNTER — Telehealth: Payer: Self-pay | Admitting: Pulmonary Disease

## 2015-10-21 MED ORDER — AZITHROMYCIN 250 MG PO TABS: ORAL_TABLET | ORAL | Status: DC

## 2015-10-21 NOTE — Telephone Encounter (Signed)
Called and spoke to pt. During pt's OV, a zpak was needing to be sent to pharmacy, this was not sent in. Zpak interacts with coumadin; spoke with SN: several abx interact with coumadin, recommend alternating 1/2 tab with 1 tab every other day while on Zpak. Once zpak has been completed then go back to normal routine with coumadin.   Informed pt of the recs per SN. Zpak sent to preferred pharmacy. Apologized this was not already sent to pharmacy. Pt verbalized understanding and denied any further questions or concerns at this time.

## 2015-10-21 NOTE — Telephone Encounter (Signed)
Pt calling back again needs sent to [facility] [address]  [phone]        Fax [phone]     Pt phone [phone] calling again the meds were not called in from the visit last week

## 2015-11-11 ENCOUNTER — Other Ambulatory Visit: Payer: Self-pay | Admitting: Pulmonary Disease

## 2016-04-30 ENCOUNTER — Encounter: Payer: Self-pay | Admitting: Pulmonary Disease

## 2016-04-30 ENCOUNTER — Telehealth: Payer: Self-pay | Admitting: Pulmonary Disease

## 2016-04-30 ENCOUNTER — Ambulatory Visit (INDEPENDENT_AMBULATORY_CARE_PROVIDER_SITE_OTHER): Payer: Medicare FFS | Admitting: Pulmonary Disease

## 2016-04-30 DIAGNOSIS — Z23 Encounter for immunization: Secondary | ICD-10-CM

## 2016-04-30 DIAGNOSIS — I2510 Atherosclerotic heart disease of native coronary artery without angina pectoris: Secondary | ICD-10-CM

## 2016-04-30 DIAGNOSIS — I5022 Chronic systolic (congestive) heart failure: Secondary | ICD-10-CM

## 2016-04-30 DIAGNOSIS — J449 Chronic obstructive pulmonary disease, unspecified: Secondary | ICD-10-CM

## 2016-04-30 DIAGNOSIS — G4733 Obstructive sleep apnea (adult) (pediatric): Secondary | ICD-10-CM

## 2016-04-30 DIAGNOSIS — Z9989 Dependence on other enabling machines and devices: Secondary | ICD-10-CM

## 2016-04-30 DIAGNOSIS — Z951 Presence of aortocoronary bypass graft: Secondary | ICD-10-CM | POA: Diagnosis not present

## 2016-04-30 MED ORDER — TRAMADOL HCL 50 MG PO TABS: ORAL_TABLET | ORAL | 5 refills | Status: DC

## 2016-04-30 MED ORDER — TIOTROPIUM BROMIDE MONOHYDRATE 2.5 MCG/ACT IN AERS: 2.0000 | INHALATION_SPRAY | Freq: Every day | RESPIRATORY_TRACT | 0 refills | Status: DC

## 2016-04-30 NOTE — Progress Notes (Signed)
Subjective:     Patient ID: Tommy Riley, male   DOB: 1945-11-01, 70 y.o.   MRN: [id number]  HPI ~  December 14, 2014:  Initial consult w/ SN>   34 y/o WM, from [place], self referred for a pulmonary second opinion>  His local PCP is DrPomposini, his Cardiologist is Artist, and his Pulmonologist is DrKBird;  He has multisystem disease w/ OSA, obesity, and COPD; plus HBP, CAD- s/pCABG, AFib, Hyperlipidemia, DM, renal insuffic, Hx stroke, and arthritis...  From the pulmonary standpoint- he has OSA & on CPAP x yrs, doing satis w/ his machine & mask, rests well & denies daytime sleepiness issues;  He is currently on 3 inhalers> Pulmicort-2spBid, Spiriva daily, and Proair rescue inhaler but having to use this 3-4 times daily; he was prev on Symbicort160-2spBid but this was stopped after a Neuro eval by DrAthar who thought his LABA was contributing to the shaking & the tremor has improved off the Formoterol (interesting that it has NOT returned w/ incr use of the Albuterol in Proair);  He is c/o increased SOB & feels it is getting worse, specifically notes DOE w/ min activity & ADLs like showering, dressing, min work about the house; on further questioning it is apparent that his increased DOE has paralleled his increased sedentary lifestyle; he tells me he was Dx w/ CAD & had a 3 vessel CABG in 2001 after which he participated in cardiac rehab & he was apparently doing quite well at that time but developed AFib, had several TIAs, and retired on disability in 2003; since then he has had an increasingly sedentary lifestyle with progressive DOE despite his cardiac & pulmonary meds...       PULM> Tommy Riley is an ex-smoker, having started in his teens and smoked for 40+yrs up to 1-2ppd in his adult life, and quit in 2012 at age 62 or so (I est a 60 pack-year history);  He notes SOB as above- plus cough, small amt of thick beige sputum that is hard to expectorate, no hemoptysis & no CP; he related a hx of one prev  bronchoscopy w/ lavage ~10 yrs ago due to "thick phlegm & plugs that had to be sucked out";  He denies hx of asthma in the past but he says he's had pneumonia x3 in the past including double pneumonia when he was in the service in [place]; he says he was first diagnosed w/ COPD about 3 yrs ago... 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 FamHx is neg for any respiratory illnesses... We have requested old records from DrBird including prev CXRs, CT Chest, FullPFTs, ABGs, etc... Current Meds> Pulmicort-2spBid, Spiriva daily, and Proair rescue inhaler but having to use this 3-4 times daily.      CARDS> He has a hx of mult coronary risk factors- HBP, DM, HL; he had ?MI 2001 w/ subseq three vessel CABG; he developed AFib & had several cardioversions w/o success;  Records are avail in Epic's CareEverywhere from [place] in 2011- he had EP studies and pulmonary vein isolation procedure which was transiently successful but the AFib recurred shortly thereafter (his fatigue & decr exercise tolerance where attributed to the AFib); a cardiac MRI at that time is said to show an EF=45-50% w/ marked LAE & no thrombus, but evid for mult subendocardial infarcts in all  3 coronary art distributions; he didn't tol Tykosyn & was restarted on Amio... We have requested records from [facility] including his most recent 2DEcho (pt says his heart was at 37%) & and cath data.  Current Meds> MetopER50Bid, Cardizem120, Demadex20Qam (extra prn), Aldactone100, ?K20, ASA/ Plavix/ Coumadin...      EXAM showed Afeb, VSS, O2sat=95% on RA; he is obese- 6' Tall, 275#, BMI=37;  HEENT- short/ heavy neck, mallampati3;  Chest- decr BS w/ few scat rhonchi at bases, no wheezing or rales;  Heart- irreg gr1/6 SEM w/o r/g;  Abd- obese, soft, neg;  Ext- TEDs, 1+edema  CXR 12/14/14 showed cardiomegaly,  prior CABG, clear lungs except for some bibasilar scarring, NAD...   Spirometry 12/14/14 showed FVC=2.73 (56%), FEV1=1.63 (43%), %1sec=60, mid-flows are reduced at 24% predicted; this is c/w mod airflow obstruction and GOLD Stage3 COPD...   Ambulatory oxygen saturation test 12/2014> O2sat=95% on RA at rest w/ pulse=97/min;  He walked 2 laps in out office w/ lowest O2sat=93% w/ pulse=111/min & stopped due to SOB & fatigue.  LABS 12/2014:  Chems- ok x BS=141, BUN=46, Cr=1.6;  CBC- wnl w/ Hg=16.8;  BNP=72;  Sed=20;  TSH=1.37       IMP/PLAN>>   We took extra time to explain the multifactorial nature of his dyspnea- pulm/ cardiac/ deconditioning;  From the pulmonary standpoint we decided to change the Pulmicort to ADVAIR500-Bid, continue the [facility] daily, continue the [facility] rescue inhaler but hopefully he will be able to decr it's use;  He can also use MUCINEX 639mQid w/ fluids for the thick phlegm;  From the cardiac perspective we have [place] records from [place](esp the most recent 2DEcho to check EF & PA pressure estimates), note that his BNP is norm at 72;  Finally it is imperative that he starts back into a slow gradual exercise program (eg- Silver Sneakers etc) & we discussed this in detail... I have asked him to ret in 4-6wks for recheck & hopefully we will have his old records to review.    ~  January 11, 2015:  154moOV w/ SN>  Tommy Riley that he is feeling better on the ADVAIR500Bid, Spiriva daily, & Proair rescue (in fact he hasn't used the PrIAC/InterActiveCorpince his last visit!);  We have not received any records from [place] Cardiology [place] esp interested in [place] 2[place]cho results)- we will request these again;  He has inquired at his local hosp but they no longer offer pulm rehab program, however [facility] and we will call to arrange this for him... Unfortunately Tommy Riley gained a few lbs but admits to not  taking the diet & exercise prescription seriously enough- he will re-double his efforts in this regard...      EXAM reveals Afeb, VSS, O2sat=92% on RA, wt=288#, 5'11" Tall, BMI=40;  HEENT- short neck, mallampati3;  Chest- decr BS at bases otherw clear;  Heart- irreg, gr1/6 SEM, w/o r/g;  Abd- obese, soft, neg;  Ext- TEDs, 1+edema...       IMP/PLAN>>  Tommy Riley improved on the Advair500, Spiriva, Proair (hasn't used this in the last month); OK to wean to the Avdair250Bid... Diet & exercise are critically important here & we reviewed this...  We will refer to pulm rehab via [place] we will [place] again to [place].    ~  April 18, 2015:  45m545moV w/  SN>  Tommy Riley returns & reports a good interval- stable on his Advair250, Spiriva, ProventilHFA rescue (using this <1/mo);  His wt is down 5# to 282# today but wife notes still not dieting satis & not exercising regularly;  Recall his hx of HBP, CAD, s/pCABG so diet & exercise are critical for this system as well...      OSA on CPAP> he reports on CPAP for yrs, ?set at 28?, doing satis by his description, we don't have data from his doctors in [place]...      COPD, ex-smoker> on Advair250Bid, spiriva daily, ProventilHFA rescue prn; he quit smoking in 2012; Spirometry 6/16 showed mod airflow obstruction w/ GOLD Stage3 COPD w/ FEV1=1.63 (43%); CXR w/ cardiomeg, prev CABG, clear lungs w/ some scarring at bases.      Dyspnea, Severe deconditioning> he hasn't take the advise to start exercise program so far; we reviewed this again!      HBP, CAD, s/pCABG, Hx stroke, AFib on coumadin> pt reports EF=37%> on ASA81, Plavix75, MetopER50Bid, Cardizem120, Demadex20Bid, Aldactone100, & Coumadin (?triple antiplat/ anticoag therapy?); followed by Cards in [place] we never received their records to review...       HL, DM on insulin, Obesity, RI> on Cres20, Niacin500bid, Lovaza, Levemir insulin (80u daily), Allopurinol100;  he tells me that his last A1c was 6.8 but we do not have records from [place]; Cr here 6/16 was 1.6...  We reviewed prob list, meds, xrays and labs>  IMP/PLAN>>  Tommy Riley is stable on above therapy; I would love for him to get medical records from his [place] team for [place] to review; we had a frank conversation indicating that further improvement is totally dependent on diet/ exercise/ & wt reduction- he understands...   ~  October 17, 2015:  82moROV w/ SN>  MRuddyreports a good interval w/o new complaints or concerns- he is trying to eat less "no sweets" & in fact is down 13# to 279# today; breathing is unchanged, SOB at baseline & again asked to increase his exercise program;  He notes occas bouts w/ cough, chest congestion, and light gray mucus => we discussed ZPak for prn use...       OSA on CPAP> he reports on CPAP for yrs, ?set at 148, doing satis by his description, we don't have data [place]..      COPD, ex-smoker> on Advair250Bid, Spiriva daily, ProventilHFA rescue prn; he quit smoking in 2012; Spirometry 6/16 showed mod airflow obstruction w/ GOLD Stage3 COPD w/ FEV1=1.63 (43%); CXR w/ cardiomeg, prev CABG, clear lungs w/ some scarring at bases.      Dyspnea, Severe deconditioning> he hasn't take the advise to start exercise program so far; we reviewed this again!      HBP, CAD, s/pCABG, ischemic cardiomyopathy- EF=30%, Hx stroke, AFib on coumadin> pt reports EF=37%> on ASA81, Plavix75, MetopER50Bid, Cardizem120, Demadex20Bid, Aldactone100, & Coumadin (?triple antiplat/ anticoag therapy?); [place] review...       HL, DM on insulin, Obesity, RI> off Cres20 & on Niacin500bid, Lovaza, Levemir insulin (80u daily), Allopurinol100; he tells me that his last A1c was 6.8 but [place] Cr here 6/16 was 1.6...  [facility] CKD- stage3 w/ Cr=1.20 and stable...    EXAM reveals Afeb, VSS, O2sat=96% on RA, wt=279#, 5'11" Tall, BMI=38;  HEENT- short neck, mallampati3;  Chest- decr BS at bases otherw  clear;  Heart- irreg, gr1/6 SEM, w/o r/g;  Abd- obese, soft, neg;  Ext- TEDs, tr edema...  IMP/PLAN>>  Asiah is stable on his Advair250Bid & Spiriva daily; hasn't needed his Albut rescue inhaler at all; we discussed ZPak for prn use & Rx provided; he will check w/ his PCP regarding vaccination schedule to be sure he is up-to-date; he definitely needs to increase his exercise program & keep up the good work w/ wt reduction... We plan ROV in 36mo sooner if needed for any problems...   ~  April 30, 2016:  625moOV w/ SN>  MaChristaneturns & reports that in June2017 he had a rough boat ride w/ his son, bounced around a lot, & developed severe tailbone pain ever since then;  He was evaluated by his PCP & Ortho w/ XRays and an MRI- no fracture detected;  They tried Rx w/ Pred dosepak, Tramadol, Hydrocodone but NOTHING HELPED;  He is sl tender over tailbone in the crack but not exquisitely so- we discussed this & I mentioned they could consider a nuclear bone scan for further eval, in the meanwhile- try rest/ HEAT/ plus combination of TRAMADOL50 + Extra strength Tylenol TID for the pain... we reviewed the following medical problems during today's office visit >>       OSA on CPAP> on CPAP x yrs [place]set at 1745 doing satis by his description, we don't have download data avail to review, asked to bring download from his DME on ret...      COPD, ex-smoker> on Advair250Bid, Spiriva daily, ProventilHFA rescue prn; he quit smoking in 2012; Spirometry 6/16 showed mod airflow obstruction w/ GOLD Stage3 COPD w/ FEV1=1.63 (43%); CXR w/ cardiomeg, prev CABG, clear lungs w/ some scarring at bases; he has ZPak for prn use in the winter....      Dyspnea, Severe deconditioning> he had lost wt down to 267# he says before the tailbone injury & hasn't been able to  exercise since then; we reviewed the needed DIET/EXERCISE/Wt reduction strategies...      Cardiovasc issues>  HBP, CAD, s/pCABG, ischemic cardiomyopathy- EF=30%, Hx stroke, AFib on coumadin> pt reports EF=37%> on ASA81, Plavix75, MetopER50Bid, Cardizem120, Demadex20Bid, Aldactone100, & Coumadin (?triple antiplat/ anticoag therapy?); [place] to review...       Medical issues>  HL, DM on insulin, Obesity, RI> off Crestor & Niacin, on Lovaza, Levemir insulin (80u daily), Allopurinol100; he tells me that his last A1c was 6.8 & Cr here 6/16 was 1.6; we received note from Nephrology-DrBaveja 08/16/15-- CKD, stage3, w/ Cr 1.5-1.8 over the last 2y71yrut improved to 1.2... Marland KitchenMarland KitchenXAM reveals Afeb, VSS, O2sat=96% on RA, wt=281#, 5'11" Tall, BMI=38;  HEENT- short neck, mallampati3;  Chest- decr BS at bases otherw clear;  Heart- irreg, gr1/6 SEM, w/o r/g;  Abd- obese, soft, neg;  Ext- TEDs, tr edema...  IMP/PLAN>>  Tommy Riley had a set back w/ the tailbone pain- perhaps Ortho could consider a nuclear bone scan if the pain persists; advised REST, apply HEAT, try combination of TRAMADOL50 + extra strength Tylenol TID; from the pulmonary standpoint he needs to get more active, exercise program discussed, and [facility] w/ ProairHFA rescue inhaler as needed;  We gave him the 2017 FLU vaccine today & rec that he review w/ his PCP regarding his needed pneumonia vaccinations...     Past Medical History  Diagnosis Date  . Heart  disease  >> on MetoprololER, Cardizem, Demadex, Aldactone, ?KCl, off prev Lotensin     Atherosclerotic  . Atrial fibrillation >> on ASA, Plavix, Coumadin (?triple antiplat/anticoag therapy?- Cards in [place])   . Hypertension   . Diabetes mellitus without complication >> on Levemir insulin   . Atrial fibrillation   . Stroke 2003  . Kidney disease     stage 3  . High cholesterol >> on Crestor, Lovaza, Niacin   .  Depression   . COPD (chronic obstructive pulmonary disease)   . OSA (obstructive sleep apnea)     Past Surgical History:  Procedure Laterality Date  . CORONARY ARTERY BYPASS GRAFT    . heart ablation  2011  . triple by pass  09/16/1999    Outpatient Encounter Prescriptions as of 04/30/2016  Medication Sig  . ACCU-CHEK AVIVA PLUS test strip   . albuterol (PROVENTIL HFA;VENTOLIN HFA) 108 (90 BASE) MCG/ACT inhaler Inhale 2 puffs into the lungs every 4 (four) hours as needed for wheezing or shortness of breath. Inhale one to two puffs by mouth as needed  . allopurinol (ZYLOPRIM) 100 MG tablet Take 100 mg by mouth 2 (two) times daily.   Marland Kitchen aspirin 81 MG tablet Take 81 mg by mouth daily.  . Blood Glucose Monitoring Suppl (ONE TOUCH ULTRA 2) W/DEVICE KIT by Does not apply route.  . Cholecalciferol (VITAMIN D3) 2000 UNITS TABS Take by mouth daily.  . clopidogrel (PLAVIX) 75 MG tablet Take 75 mg by mouth daily.  Marland Kitchen diltiazem (CARDIZEM) 120 MG tablet Take 120 mg by mouth daily.  . Fluticasone-Salmeterol (ADVAIR) 250-50 MCG/DOSE AEPB Inhale 1 puff into the lungs every 12 (twelve) hours.  . folic acid (FOLVITE) 694 MCG tablet Take 400 mcg by mouth daily.  . insulin detemir (LEVEMIR) 100 UNIT/ML injection Inject 80 Units into the skin daily.  . Melatonin 5 MG TABS Take by mouth daily.  . metoprolol succinate (TOPROL-XL) 50 MG 24 hr tablet Take 50 mg by mouth 2 (two) times daily. Take 1 tablet 2 times daily  . Multiple Vitamins-Minerals (CENTRUM SILVER PO) Take by mouth 2 (two) times daily.  Marland Kitchen NIACIN PO Take 500 mg by mouth 2 (two) times daily. Take 500 mg two times daily  . omega-3 acid ethyl esters (LOVAZA) 1 G capsule Take by mouth 2 (two) times daily.  . Potassium Chloride (KLOR-CON 10 PO) Take by mouth daily. Extended release  . rosuvastatin (CRESTOR) 20 MG tablet Take 20 mg by mouth daily.  Marland Kitchen SPIRIVA HANDIHALER 18 MCG inhalation capsule INHALE THE CONTENTS OF ONE CAPSULE VIA DEVICE ONCE DAILY.    Marland Kitchen spironolactone (ALDACTONE) 100 MG tablet Take 100 mg by mouth daily.  . temazepam (RESTORIL) 15 MG capsule Take 15 mg by mouth at bedtime as needed for sleep.  Marland Kitchen torsemide (DEMADEX) 20 MG tablet Take 20 mg by mouth 2 (two) times daily.  Marland Kitchen warfarin (COUMADIN) 3 MG tablet Take 3 mg by mouth daily.  . Tiotropium Bromide Monohydrate (SPIRIVA RESPIMAT) 2.5 MCG/ACT AERS Inhale 2 puffs into the lungs daily.  . traMADol (ULTRAM) 50 MG tablet Take 1 tablet by mouth three times daily with tylenol as directed  . [DISCONTINUED] azithromycin (ZITHROMAX) 250 MG tablet Take as directed (Patient not taking: Reported on 04/30/2016)   No facility-administered encounter medications on file as of 04/30/2016.     Allergies  Allergen Reactions  . Codeine Anaphylaxis    Tightness in chest    Immunization History  Administered Date(s) Administered  .  Influenza, High Dose Seasonal PF 04/30/2016  . Influenza,inj,Quad PF,36+ Mos 05/08/2015  . Influenza-Unspecified 04/14/2014  . Pneumococcal Conjugate-13 04/14/2014    Current Medications, Allergies, Past Medical History, Past Surgical History, Family History, and Social History were reviewed in [employer] record.   Review of Systems            All symptoms NEG except where BOLDED >>  Constitutional:  F/C/S, fatigue, anorexia, unexpected weight change. HEENT:  HA, visual changes, hearing loss, earache, nasal symptoms, sore throat, mouth sores, hoarseness. Resp:  cough, sputum, hemoptysis; SOB, tightness, wheezing. Cardio:  CP, palpit, DOE, orthopnea, edema. GI:  N/V/D/C, blood in stool; reflux, abd pain, distention, gas. GU:  dysuria, freq, urgency, hematuria, flank pain, voiding difficulty. MS:  joint pain, swelling, tenderness, decr ROM; neck pain, back pain, etc. Neuro:  HA, tremors, seizures, dizziness, syncope, weakness, numbness, gait abn. Skin:  suspicious lesions or skin rash. Heme:  adenopathy, bruising,  bleeding. Psyche:  confusion, agitation, sleep disturbance, hallucinations, anxiety, depression suicidal.   Objective:   Physical Exam      Vital Signs:  Reviewed...   General:  WD, obese, 70 y/o WM in NAD; alert & oriented; pleasant & cooperative... HEENT:  [place]/AT; Conjunctiva- pink, Sclera- nonicteric, EOM-wnl, PERRLA, EACs-clear, TMs-wnl; NOSE-clear; THROAT-clear & wnl.  Neck:  Supple w/ decr ROM; no JVD; normal carotid impulses w/o bruits; no thyromegaly or nodules palpated; no lymphadenopathy.  Chest:  decr BS at bases but no wheezing, rales, rhonchi, or signs of consolidation... Heart:  Irreg, AFib, Gr1/6 SEM w/o rubs or gallops detected... Abdomen:  Soft & nontender- no guarding or rebound; normal bowel sounds; no organomegaly or masses palpated. Ext:  decrROM; +arthritic changes; no varicose veins, +venous insuffic, 1+ edema;  Pulses intact w/o bruits. Neuro:  No focal neuro deficits,gait normal & balance OK. Derm:  No lesions noted; no rash etc. Lymph:  No cervical, supraclavicular, axillary, or inguinal adenopathy palpated.   Assessment:      IMP >>       OSA on CPAP> on CPAP x yrs per his physicians in [place] => continue same & we encouraged compliance...      COPD, ex-smoker> on Advair250Bid, Spiriva daily, ProventilHFA rescue prn; he quit smoking in 2012; Spirometry 6/16 showed mod airflow obstruction w/ GOLD Stage3 COPD w/ FEV1=1.63 (43%); CXR w/ cardiomeg, prev CABG, clear lungs w/ some scarring at bases; he has ZPak for prn use in the winter....      Dyspnea, Severe deconditioning> he had lost wt down to 267# he says before the tailbone injury & hasn't been able to exercise since then; we reviewed the needed DIET/EXERCISE/Wt reduction strategies...      Cardiovasc issues>  HBP, CAD, s/pCABG, ischemic cardiomyopathy- EF=30%, Hx stroke, AFib on coumadin> pt reports EF=37%> on ASA81, Plavix75, MetopER50Bid, Cardizem120, Demadex20Bid, Aldactone100, & Coumadin (?triple  antiplat/ anticoag therapy?); followed by Cards in [place] we never received their records to review...       Medical issues>  HL, DM on insulin, Obesity, RI, tailbone injury 12/2015> off Crestor & Niacin, on Lovaza, Levemir insulin (80u daily), Allopurinol100; he tells me that his last A1c was 6.8 & Cr here 6/16 was 1.6; we received note from Nephrology-DrBaveja 08/16/15-- CKD, stage3, w/ Cr 1.5-1.8 over the last 79yr but improved to 1.2...   PLAN >>   04/18/15>   MJayrois stable on above therapy; I would love for him to [place] medical [place]; we had a frank conversation indicating that further improvement is totally dependent on diet/ exercise/ & wt reduction- he understands; we plan ROV in 32mo, sooner if needed prn...  10/17/15>   Tommy Riley is stable on his Advair250Bid & Spiriva daily; hasn't needed his Albut rescue inhaler at all; we discussed ZPak for prn use & Rx provided; he will check w/ his PCP regarding vaccination schedule to be sure he is up-to-date; he definitely needs to increase his exercise program 7 keep up the good work w/ wt reduction... 04/30/16>   Tommy Riley has had a set back w/ the tailbone pain- perhaps Ortho could consider a nuclear bone scan if the pain persists; advised REST, apply HEAT, try combination of TRAMADOL50 + extra strength Tylenol TID; from the pulmonary standpoint he needs to get more active, exercise program discussed, and continue the ADVIAR/ [facility] regularly w/ ProairHFA rescue inhaler as needed;  We gave him the 2017 FLU vaccine today & rec that he review w/ his PCP regarding his needed pneumonia vaccinations     Plan:     Patient's Medications  New Prescriptions   TIOTROPIUM BROMIDE MONOHYDRATE (SPIRIVA RESPIMAT) 2.5 MCG/ACT AERS    Inhale 2 puffs into the lungs daily.   TRAMADOL (ULTRAM) 50 MG TABLET    Take 1 tablet by mouth three times daily with tylenol as directed  Previous Medications   ACCU-CHEK AVIVA PLUS TEST  STRIP       ALBUTEROL (PROVENTIL HFA;VENTOLIN HFA) 108 (90 BASE) MCG/ACT INHALER    Inhale 2 puffs into the lungs every 4 (four) hours as needed for wheezing or shortness of breath. Inhale one to two puffs by mouth as needed   ALLOPURINOL (ZYLOPRIM) 100 MG TABLET    Take 100 mg by mouth 2 (two) times daily.    ASPIRIN 81 MG TABLET    Take 81 mg by mouth daily.   BLOOD GLUCOSE MONITORING SUPPL (ONE TOUCH ULTRA 2) W/DEVICE KIT    by Does not apply route.   CHOLECALCIFEROL (VITAMIN D3) 2000 UNITS TABS    Take by mouth daily.   CLOPIDOGREL (PLAVIX) 75 MG TABLET    Take 75 mg by mouth daily.   DILTIAZEM (CARDIZEM) 120 MG TABLET    Take 120 mg by mouth daily.   FLUTICASONE-SALMETEROL (ADVAIR) 250-50 MCG/DOSE AEPB    Inhale 1 puff into the lungs every 12 (twelve) hours.   FOLIC ACID (FOLVITE) 400 MCG TABLET    Take 400 mcg by mouth daily.   INSULIN DETEMIR (LEVEMIR) 100 UNIT/ML INJECTION    Inject 80 Units into the skin daily.   MELATONIN 5 MG TABS    Take by mouth daily.   METOPROLOL SUCCINATE (TOPROL-XL) 50 MG 24 HR TABLET    Take 50 mg by mouth 2 (two) times daily. Take 1 tablet 2 times daily   MULTIPLE VITAMINS-MINERALS (CENTRUM SILVER PO)    Take by mouth 2 (two) times daily.   NIACIN PO    Take 500 mg by mouth 2 (two) times daily. Take 500 mg two times daily   OMEGA-3 ACID ETHYL ESTERS (LOVAZA) 1 G CAPSULE    Take by mouth 2 (two) times daily.   POTASSIUM CHLORIDE (KLOR-CON 10 PO)    Take by mouth daily. Extended release   ROSUVASTATIN (CRESTOR) 20 MG TABLET    Take 20 mg by mouth daily.   SPIRIVA HANDIHALER 18 MCG INHALATION CAPSULE    INHALE THE CONTENTS OF ONE CAPSULE VIA DEVICE ONCE DAILY.  SPIRONOLACTONE (ALDACTONE) 100 MG TABLET    Take 100 mg by mouth daily.   TEMAZEPAM (RESTORIL) 15 MG CAPSULE    Take 15 mg by mouth at bedtime as needed for sleep.   TORSEMIDE (DEMADEX) 20 MG TABLET    Take 20 mg by mouth 2 (two) times daily.   WARFARIN (COUMADIN) 3 MG TABLET    Take 3 mg by mouth daily.   Modified Medications   No medications on file  Discontinued Medications   AZITHROMYCIN (ZITHROMAX) 250 MG TABLET    Take as directed

## 2016-04-30 NOTE — Patient Instructions (Addendum)
Today we updated your med list in our EPIC system...    Continue your current medications the same...  For your tailbone pain>>    Try using a heating pad when sitting in your comfortable padded chair at home...    Use the TRAMADOL 50mg  with an extra strength Tylenol up to 3 times daily as we discussed...  Consider getting a NUCLEAR BONE SCAN for further evaluation if the pain persists...   When able- let's get back on track w/ your DIET, EXERCISE & Weight reduction program!  We gave you the 2017 FLU vaccine today...  Call for any questions or if we can be of service in any way...  Let's plan a follow up visit in 60mo, sooner if needed for problems.Marland KitchenMarland Kitchen

## 2016-04-30 NOTE — Telephone Encounter (Signed)
Spoke to [employer] with [employer] and gave him verbal for tramadol as medicare requires a rx on tamper resistant paper or a verbal. Nothing further needed.

## 2016-06-18 ENCOUNTER — Other Ambulatory Visit: Payer: Self-pay | Admitting: Pulmonary Disease

## 2016-07-08 DIAGNOSIS — M5137 Other intervertebral disc degeneration, lumbosacral region: Secondary | ICD-10-CM | POA: Diagnosis not present

## 2016-07-08 DIAGNOSIS — M9903 Segmental and somatic dysfunction of lumbar region: Secondary | ICD-10-CM | POA: Diagnosis not present

## 2016-07-08 DIAGNOSIS — S338XXA Sprain of other parts of lumbar spine and pelvis, initial encounter: Secondary | ICD-10-CM | POA: Diagnosis not present

## 2016-07-08 DIAGNOSIS — M9904 Segmental and somatic dysfunction of sacral region: Secondary | ICD-10-CM | POA: Diagnosis not present

## 2016-07-08 DIAGNOSIS — S336XXA Sprain of sacroiliac joint, initial encounter: Secondary | ICD-10-CM | POA: Diagnosis not present

## 2016-07-08 DIAGNOSIS — M9905 Segmental and somatic dysfunction of pelvic region: Secondary | ICD-10-CM | POA: Diagnosis not present

## 2016-07-13 DIAGNOSIS — M5137 Other intervertebral disc degeneration, lumbosacral region: Secondary | ICD-10-CM | POA: Diagnosis not present

## 2016-07-13 DIAGNOSIS — S338XXA Sprain of other parts of lumbar spine and pelvis, initial encounter: Secondary | ICD-10-CM | POA: Diagnosis not present

## 2016-07-13 DIAGNOSIS — M9903 Segmental and somatic dysfunction of lumbar region: Secondary | ICD-10-CM | POA: Diagnosis not present

## 2016-07-13 DIAGNOSIS — S336XXA Sprain of sacroiliac joint, initial encounter: Secondary | ICD-10-CM | POA: Diagnosis not present

## 2016-07-13 DIAGNOSIS — M9905 Segmental and somatic dysfunction of pelvic region: Secondary | ICD-10-CM | POA: Diagnosis not present

## 2016-07-13 DIAGNOSIS — M9904 Segmental and somatic dysfunction of sacral region: Secondary | ICD-10-CM | POA: Diagnosis not present

## 2016-07-14 DIAGNOSIS — M9904 Segmental and somatic dysfunction of sacral region: Secondary | ICD-10-CM | POA: Diagnosis not present

## 2016-07-14 DIAGNOSIS — M5137 Other intervertebral disc degeneration, lumbosacral region: Secondary | ICD-10-CM | POA: Diagnosis not present

## 2016-07-14 DIAGNOSIS — S338XXA Sprain of other parts of lumbar spine and pelvis, initial encounter: Secondary | ICD-10-CM | POA: Diagnosis not present

## 2016-07-14 DIAGNOSIS — S336XXA Sprain of sacroiliac joint, initial encounter: Secondary | ICD-10-CM | POA: Diagnosis not present

## 2016-07-14 DIAGNOSIS — M9903 Segmental and somatic dysfunction of lumbar region: Secondary | ICD-10-CM | POA: Diagnosis not present

## 2016-07-14 DIAGNOSIS — M9905 Segmental and somatic dysfunction of pelvic region: Secondary | ICD-10-CM | POA: Diagnosis not present

## 2016-07-15 DIAGNOSIS — M9903 Segmental and somatic dysfunction of lumbar region: Secondary | ICD-10-CM | POA: Diagnosis not present

## 2016-07-15 DIAGNOSIS — S336XXA Sprain of sacroiliac joint, initial encounter: Secondary | ICD-10-CM | POA: Diagnosis not present

## 2016-07-15 DIAGNOSIS — M9905 Segmental and somatic dysfunction of pelvic region: Secondary | ICD-10-CM | POA: Diagnosis not present

## 2016-07-15 DIAGNOSIS — S338XXA Sprain of other parts of lumbar spine and pelvis, initial encounter: Secondary | ICD-10-CM | POA: Diagnosis not present

## 2016-07-15 DIAGNOSIS — M5137 Other intervertebral disc degeneration, lumbosacral region: Secondary | ICD-10-CM | POA: Diagnosis not present

## 2016-07-15 DIAGNOSIS — M9904 Segmental and somatic dysfunction of sacral region: Secondary | ICD-10-CM | POA: Diagnosis not present

## 2016-07-16 DIAGNOSIS — M9905 Segmental and somatic dysfunction of pelvic region: Secondary | ICD-10-CM | POA: Diagnosis not present

## 2016-07-16 DIAGNOSIS — S336XXA Sprain of sacroiliac joint, initial encounter: Secondary | ICD-10-CM | POA: Diagnosis not present

## 2016-07-16 DIAGNOSIS — M5137 Other intervertebral disc degeneration, lumbosacral region: Secondary | ICD-10-CM | POA: Diagnosis not present

## 2016-07-16 DIAGNOSIS — S338XXA Sprain of other parts of lumbar spine and pelvis, initial encounter: Secondary | ICD-10-CM | POA: Diagnosis not present

## 2016-07-16 DIAGNOSIS — M9903 Segmental and somatic dysfunction of lumbar region: Secondary | ICD-10-CM | POA: Diagnosis not present

## 2016-07-16 DIAGNOSIS — M9904 Segmental and somatic dysfunction of sacral region: Secondary | ICD-10-CM | POA: Diagnosis not present

## 2016-07-20 ENCOUNTER — Telehealth: Payer: Self-pay | Admitting: Pulmonary Disease

## 2016-07-20 DIAGNOSIS — M9903 Segmental and somatic dysfunction of lumbar region: Secondary | ICD-10-CM | POA: Diagnosis not present

## 2016-07-20 DIAGNOSIS — M5137 Other intervertebral disc degeneration, lumbosacral region: Secondary | ICD-10-CM | POA: Diagnosis not present

## 2016-07-20 DIAGNOSIS — M9905 Segmental and somatic dysfunction of pelvic region: Secondary | ICD-10-CM | POA: Diagnosis not present

## 2016-07-20 DIAGNOSIS — S336XXA Sprain of sacroiliac joint, initial encounter: Secondary | ICD-10-CM | POA: Diagnosis not present

## 2016-07-20 DIAGNOSIS — S338XXA Sprain of other parts of lumbar spine and pelvis, initial encounter: Secondary | ICD-10-CM | POA: Diagnosis not present

## 2016-07-20 DIAGNOSIS — M9904 Segmental and somatic dysfunction of sacral region: Secondary | ICD-10-CM | POA: Diagnosis not present

## 2016-07-20 MED ORDER — ALBUTEROL SULFATE HFA 108 (90 BASE) MCG/ACT IN AERS: 2.0000 | INHALATION_SPRAY | RESPIRATORY_TRACT | 3 refills | Status: DC | PRN

## 2016-07-20 MED ORDER — FLUTICASONE-SALMETEROL 250-50 MCG/DOSE IN AEPB: 1.0000 | INHALATION_SPRAY | Freq: Two times a day (BID) | RESPIRATORY_TRACT | 3 refills | Status: DC

## 2016-07-20 MED ORDER — UMECLIDINIUM BROMIDE 62.5 MCG/INH IN AEPB
1.0000 | INHALATION_SPRAY | Freq: Every day | RESPIRATORY_TRACT | 5 refills | Status: DC
Start: 2016-07-20 — End: 2017-01-20

## 2016-07-20 NOTE — Telephone Encounter (Signed)
Now he is calling for his Advair 500 mg 1 x a day and pro air 2 puffs as needed he needs these 2 to go to his mail order  [phone]  These need to go to [facility] his id # [email or web address]

## 2016-07-20 NOTE — Telephone Encounter (Signed)
SN please advise on a replacement for his spiriva since this is not covered by his insurance.  They will cover any of the following:  Atrovent, combivent, incruse, bevespi, seravent, flovent or pulmicort.   Please advise. Thanks  Allergies  Allergen Reactions  . Codeine Anaphylaxis    Tightness in chest

## 2016-07-20 NOTE — Telephone Encounter (Signed)
Per SN-okay to change to Incruse #1 1 puff Daily with prn refills. Thanks.

## 2016-07-20 NOTE — Telephone Encounter (Signed)
Send to [facility]

## 2016-07-20 NOTE — Telephone Encounter (Signed)
Spoke with pt, aware of rx change.  rx's sent to preferred pharmacies.  Nothing further needed.

## 2016-07-27 DIAGNOSIS — M9905 Segmental and somatic dysfunction of pelvic region: Secondary | ICD-10-CM | POA: Diagnosis not present

## 2016-07-27 DIAGNOSIS — M9904 Segmental and somatic dysfunction of sacral region: Secondary | ICD-10-CM | POA: Diagnosis not present

## 2016-07-27 DIAGNOSIS — M5137 Other intervertebral disc degeneration, lumbosacral region: Secondary | ICD-10-CM | POA: Diagnosis not present

## 2016-07-27 DIAGNOSIS — S338XXA Sprain of other parts of lumbar spine and pelvis, initial encounter: Secondary | ICD-10-CM | POA: Diagnosis not present

## 2016-07-27 DIAGNOSIS — S336XXA Sprain of sacroiliac joint, initial encounter: Secondary | ICD-10-CM | POA: Diagnosis not present

## 2016-07-27 DIAGNOSIS — M9903 Segmental and somatic dysfunction of lumbar region: Secondary | ICD-10-CM | POA: Diagnosis not present

## 2016-07-29 DIAGNOSIS — M9904 Segmental and somatic dysfunction of sacral region: Secondary | ICD-10-CM | POA: Diagnosis not present

## 2016-07-29 DIAGNOSIS — S336XXA Sprain of sacroiliac joint, initial encounter: Secondary | ICD-10-CM | POA: Diagnosis not present

## 2016-07-29 DIAGNOSIS — M9903 Segmental and somatic dysfunction of lumbar region: Secondary | ICD-10-CM | POA: Diagnosis not present

## 2016-07-29 DIAGNOSIS — S338XXA Sprain of other parts of lumbar spine and pelvis, initial encounter: Secondary | ICD-10-CM | POA: Diagnosis not present

## 2016-07-29 DIAGNOSIS — M5137 Other intervertebral disc degeneration, lumbosacral region: Secondary | ICD-10-CM | POA: Diagnosis not present

## 2016-07-29 DIAGNOSIS — M9905 Segmental and somatic dysfunction of pelvic region: Secondary | ICD-10-CM | POA: Diagnosis not present

## 2016-07-30 DIAGNOSIS — M9903 Segmental and somatic dysfunction of lumbar region: Secondary | ICD-10-CM | POA: Diagnosis not present

## 2016-07-30 DIAGNOSIS — S338XXA Sprain of other parts of lumbar spine and pelvis, initial encounter: Secondary | ICD-10-CM | POA: Diagnosis not present

## 2016-07-30 DIAGNOSIS — M5137 Other intervertebral disc degeneration, lumbosacral region: Secondary | ICD-10-CM | POA: Diagnosis not present

## 2016-07-30 DIAGNOSIS — M9904 Segmental and somatic dysfunction of sacral region: Secondary | ICD-10-CM | POA: Diagnosis not present

## 2016-07-30 DIAGNOSIS — M9905 Segmental and somatic dysfunction of pelvic region: Secondary | ICD-10-CM | POA: Diagnosis not present

## 2016-07-30 DIAGNOSIS — S336XXA Sprain of sacroiliac joint, initial encounter: Secondary | ICD-10-CM | POA: Diagnosis not present

## 2016-08-03 DIAGNOSIS — M9904 Segmental and somatic dysfunction of sacral region: Secondary | ICD-10-CM | POA: Diagnosis not present

## 2016-08-03 DIAGNOSIS — I482 Chronic atrial fibrillation: Secondary | ICD-10-CM | POA: Diagnosis not present

## 2016-08-03 DIAGNOSIS — M5137 Other intervertebral disc degeneration, lumbosacral region: Secondary | ICD-10-CM | POA: Diagnosis not present

## 2016-08-03 DIAGNOSIS — M9905 Segmental and somatic dysfunction of pelvic region: Secondary | ICD-10-CM | POA: Diagnosis not present

## 2016-08-03 DIAGNOSIS — S338XXA Sprain of other parts of lumbar spine and pelvis, initial encounter: Secondary | ICD-10-CM | POA: Diagnosis not present

## 2016-08-03 DIAGNOSIS — M9903 Segmental and somatic dysfunction of lumbar region: Secondary | ICD-10-CM | POA: Diagnosis not present

## 2016-08-03 DIAGNOSIS — S336XXA Sprain of sacroiliac joint, initial encounter: Secondary | ICD-10-CM | POA: Diagnosis not present

## 2016-08-05 DIAGNOSIS — M5137 Other intervertebral disc degeneration, lumbosacral region: Secondary | ICD-10-CM | POA: Diagnosis not present

## 2016-08-05 DIAGNOSIS — S338XXA Sprain of other parts of lumbar spine and pelvis, initial encounter: Secondary | ICD-10-CM | POA: Diagnosis not present

## 2016-08-05 DIAGNOSIS — M9905 Segmental and somatic dysfunction of pelvic region: Secondary | ICD-10-CM | POA: Diagnosis not present

## 2016-08-05 DIAGNOSIS — M9903 Segmental and somatic dysfunction of lumbar region: Secondary | ICD-10-CM | POA: Diagnosis not present

## 2016-08-05 DIAGNOSIS — S336XXA Sprain of sacroiliac joint, initial encounter: Secondary | ICD-10-CM | POA: Diagnosis not present

## 2016-08-05 DIAGNOSIS — M9904 Segmental and somatic dysfunction of sacral region: Secondary | ICD-10-CM | POA: Diagnosis not present

## 2016-08-06 DIAGNOSIS — S338XXA Sprain of other parts of lumbar spine and pelvis, initial encounter: Secondary | ICD-10-CM | POA: Diagnosis not present

## 2016-08-06 DIAGNOSIS — M9904 Segmental and somatic dysfunction of sacral region: Secondary | ICD-10-CM | POA: Diagnosis not present

## 2016-08-06 DIAGNOSIS — M9905 Segmental and somatic dysfunction of pelvic region: Secondary | ICD-10-CM | POA: Diagnosis not present

## 2016-08-06 DIAGNOSIS — S336XXA Sprain of sacroiliac joint, initial encounter: Secondary | ICD-10-CM | POA: Diagnosis not present

## 2016-08-06 DIAGNOSIS — M5137 Other intervertebral disc degeneration, lumbosacral region: Secondary | ICD-10-CM | POA: Diagnosis not present

## 2016-08-06 DIAGNOSIS — M9903 Segmental and somatic dysfunction of lumbar region: Secondary | ICD-10-CM | POA: Diagnosis not present

## 2016-08-10 DIAGNOSIS — E785 Hyperlipidemia, unspecified: Secondary | ICD-10-CM | POA: Diagnosis not present

## 2016-08-10 DIAGNOSIS — D696 Thrombocytopenia, unspecified: Secondary | ICD-10-CM | POA: Diagnosis not present

## 2016-08-10 DIAGNOSIS — S338XXA Sprain of other parts of lumbar spine and pelvis, initial encounter: Secondary | ICD-10-CM | POA: Diagnosis not present

## 2016-08-10 DIAGNOSIS — M9903 Segmental and somatic dysfunction of lumbar region: Secondary | ICD-10-CM | POA: Diagnosis not present

## 2016-08-10 DIAGNOSIS — I639 Cerebral infarction, unspecified: Secondary | ICD-10-CM | POA: Diagnosis not present

## 2016-08-10 DIAGNOSIS — I482 Chronic atrial fibrillation: Secondary | ICD-10-CM | POA: Diagnosis not present

## 2016-08-10 DIAGNOSIS — K5790 Diverticulosis of intestine, part unspecified, without perforation or abscess without bleeding: Secondary | ICD-10-CM | POA: Diagnosis not present

## 2016-08-10 DIAGNOSIS — E119 Type 2 diabetes mellitus without complications: Secondary | ICD-10-CM | POA: Diagnosis not present

## 2016-08-10 DIAGNOSIS — Z1212 Encounter for screening for malignant neoplasm of rectum: Secondary | ICD-10-CM | POA: Diagnosis not present

## 2016-08-10 DIAGNOSIS — M9904 Segmental and somatic dysfunction of sacral region: Secondary | ICD-10-CM | POA: Diagnosis not present

## 2016-08-10 DIAGNOSIS — Z7901 Long term (current) use of anticoagulants: Secondary | ICD-10-CM | POA: Diagnosis not present

## 2016-08-10 DIAGNOSIS — M9905 Segmental and somatic dysfunction of pelvic region: Secondary | ICD-10-CM | POA: Diagnosis not present

## 2016-08-10 DIAGNOSIS — Z6839 Body mass index (BMI) 39.0-39.9, adult: Secondary | ICD-10-CM | POA: Diagnosis not present

## 2016-08-10 DIAGNOSIS — M50321 Other cervical disc degeneration at C4-C5 level: Secondary | ICD-10-CM | POA: Diagnosis not present

## 2016-08-10 DIAGNOSIS — J42 Unspecified chronic bronchitis: Secondary | ICD-10-CM | POA: Diagnosis not present

## 2016-08-10 DIAGNOSIS — S336XXA Sprain of sacroiliac joint, initial encounter: Secondary | ICD-10-CM | POA: Diagnosis not present

## 2016-08-10 DIAGNOSIS — M109 Gout, unspecified: Secondary | ICD-10-CM | POA: Diagnosis not present

## 2016-08-10 DIAGNOSIS — Z125 Encounter for screening for malignant neoplasm of prostate: Secondary | ICD-10-CM | POA: Diagnosis not present

## 2016-08-10 DIAGNOSIS — M5137 Other intervertebral disc degeneration, lumbosacral region: Secondary | ICD-10-CM | POA: Diagnosis not present

## 2016-08-10 DIAGNOSIS — M9901 Segmental and somatic dysfunction of cervical region: Secondary | ICD-10-CM | POA: Diagnosis not present

## 2016-08-10 DIAGNOSIS — E669 Obesity, unspecified: Secondary | ICD-10-CM | POA: Diagnosis not present

## 2016-08-10 DIAGNOSIS — I259 Chronic ischemic heart disease, unspecified: Secondary | ICD-10-CM | POA: Diagnosis not present

## 2016-08-12 ENCOUNTER — Telehealth: Payer: Self-pay | Admitting: Pulmonary Disease

## 2016-08-12 DIAGNOSIS — M9905 Segmental and somatic dysfunction of pelvic region: Secondary | ICD-10-CM | POA: Diagnosis not present

## 2016-08-12 DIAGNOSIS — M9903 Segmental and somatic dysfunction of lumbar region: Secondary | ICD-10-CM | POA: Diagnosis not present

## 2016-08-12 DIAGNOSIS — M50321 Other cervical disc degeneration at C4-C5 level: Secondary | ICD-10-CM | POA: Diagnosis not present

## 2016-08-12 DIAGNOSIS — S336XXA Sprain of sacroiliac joint, initial encounter: Secondary | ICD-10-CM | POA: Diagnosis not present

## 2016-08-12 DIAGNOSIS — M5137 Other intervertebral disc degeneration, lumbosacral region: Secondary | ICD-10-CM | POA: Diagnosis not present

## 2016-08-12 DIAGNOSIS — M9901 Segmental and somatic dysfunction of cervical region: Secondary | ICD-10-CM | POA: Diagnosis not present

## 2016-08-12 DIAGNOSIS — M9904 Segmental and somatic dysfunction of sacral region: Secondary | ICD-10-CM | POA: Diagnosis not present

## 2016-08-12 DIAGNOSIS — G4733 Obstructive sleep apnea (adult) (pediatric): Secondary | ICD-10-CM

## 2016-08-12 DIAGNOSIS — S338XXA Sprain of other parts of lumbar spine and pelvis, initial encounter: Secondary | ICD-10-CM | POA: Diagnosis not present

## 2016-08-12 DIAGNOSIS — Z9989 Dependence on other enabling machines and devices: Principal | ICD-10-CM

## 2016-08-12 NOTE — Telephone Encounter (Signed)
Called and spoke with pt and he stated that his cpap is about 40-69 years old and is making a loud noise.  He feels that this is about to stop working.  He would like a new cpap machine--and supplies and faxed to the [facility] [place].  SN please advise. Thanks  Allergies  Allergen Reactions  . Codeine Anaphylaxis    Tightness in chest

## 2016-08-13 DIAGNOSIS — M9901 Segmental and somatic dysfunction of cervical region: Secondary | ICD-10-CM | POA: Diagnosis not present

## 2016-08-13 DIAGNOSIS — S336XXA Sprain of sacroiliac joint, initial encounter: Secondary | ICD-10-CM | POA: Diagnosis not present

## 2016-08-13 DIAGNOSIS — M9904 Segmental and somatic dysfunction of sacral region: Secondary | ICD-10-CM | POA: Diagnosis not present

## 2016-08-13 DIAGNOSIS — S338XXA Sprain of other parts of lumbar spine and pelvis, initial encounter: Secondary | ICD-10-CM | POA: Diagnosis not present

## 2016-08-13 DIAGNOSIS — M5137 Other intervertebral disc degeneration, lumbosacral region: Secondary | ICD-10-CM | POA: Diagnosis not present

## 2016-08-13 DIAGNOSIS — M50321 Other cervical disc degeneration at C4-C5 level: Secondary | ICD-10-CM | POA: Diagnosis not present

## 2016-08-13 DIAGNOSIS — M9903 Segmental and somatic dysfunction of lumbar region: Secondary | ICD-10-CM | POA: Diagnosis not present

## 2016-08-13 DIAGNOSIS — M9905 Segmental and somatic dysfunction of pelvic region: Secondary | ICD-10-CM | POA: Diagnosis not present

## 2016-08-14 NOTE — Telephone Encounter (Signed)
Patient returning call - he can be reached at [phone] -pr

## 2016-08-14 NOTE — Telephone Encounter (Signed)
Called and spoke with pt and he is aware of the order that has been placed and pt stated that he is set at 20 and has been for years.  Pt is aware that we do not have any other information to send them.

## 2016-08-14 NOTE — Telephone Encounter (Signed)
Per SN---  We have never received the sleep study results from his doctors in [place].  They are the ones that had originally ordered the cpap.  We do not know what machine settings are and we have never seen a download before.    We can order the machine, but we do not know any other information to send in.  LMOMTCB x 1

## 2016-08-17 DIAGNOSIS — M9904 Segmental and somatic dysfunction of sacral region: Secondary | ICD-10-CM | POA: Diagnosis not present

## 2016-08-17 DIAGNOSIS — S336XXA Sprain of sacroiliac joint, initial encounter: Secondary | ICD-10-CM | POA: Diagnosis not present

## 2016-08-17 DIAGNOSIS — N183 Chronic kidney disease, stage 3 (moderate): Secondary | ICD-10-CM | POA: Diagnosis not present

## 2016-08-17 DIAGNOSIS — S338XXA Sprain of other parts of lumbar spine and pelvis, initial encounter: Secondary | ICD-10-CM | POA: Diagnosis not present

## 2016-08-17 DIAGNOSIS — M9901 Segmental and somatic dysfunction of cervical region: Secondary | ICD-10-CM | POA: Diagnosis not present

## 2016-08-17 DIAGNOSIS — I130 Hypertensive heart and chronic kidney disease with heart failure and stage 1 through stage 4 chronic kidney disease, or unspecified chronic kidney disease: Secondary | ICD-10-CM | POA: Diagnosis not present

## 2016-08-17 DIAGNOSIS — M9905 Segmental and somatic dysfunction of pelvic region: Secondary | ICD-10-CM | POA: Diagnosis not present

## 2016-08-17 DIAGNOSIS — M50321 Other cervical disc degeneration at C4-C5 level: Secondary | ICD-10-CM | POA: Diagnosis not present

## 2016-08-17 DIAGNOSIS — M9903 Segmental and somatic dysfunction of lumbar region: Secondary | ICD-10-CM | POA: Diagnosis not present

## 2016-08-17 DIAGNOSIS — M5137 Other intervertebral disc degeneration, lumbosacral region: Secondary | ICD-10-CM | POA: Diagnosis not present

## 2016-08-19 DIAGNOSIS — M50321 Other cervical disc degeneration at C4-C5 level: Secondary | ICD-10-CM | POA: Diagnosis not present

## 2016-08-19 DIAGNOSIS — S336XXA Sprain of sacroiliac joint, initial encounter: Secondary | ICD-10-CM | POA: Diagnosis not present

## 2016-08-19 DIAGNOSIS — M9905 Segmental and somatic dysfunction of pelvic region: Secondary | ICD-10-CM | POA: Diagnosis not present

## 2016-08-19 DIAGNOSIS — M5137 Other intervertebral disc degeneration, lumbosacral region: Secondary | ICD-10-CM | POA: Diagnosis not present

## 2016-08-19 DIAGNOSIS — M9901 Segmental and somatic dysfunction of cervical region: Secondary | ICD-10-CM | POA: Diagnosis not present

## 2016-08-19 DIAGNOSIS — S338XXA Sprain of other parts of lumbar spine and pelvis, initial encounter: Secondary | ICD-10-CM | POA: Diagnosis not present

## 2016-08-19 DIAGNOSIS — M9904 Segmental and somatic dysfunction of sacral region: Secondary | ICD-10-CM | POA: Diagnosis not present

## 2016-08-19 DIAGNOSIS — M9903 Segmental and somatic dysfunction of lumbar region: Secondary | ICD-10-CM | POA: Diagnosis not present

## 2016-08-24 DIAGNOSIS — M9904 Segmental and somatic dysfunction of sacral region: Secondary | ICD-10-CM | POA: Diagnosis not present

## 2016-08-24 DIAGNOSIS — M9901 Segmental and somatic dysfunction of cervical region: Secondary | ICD-10-CM | POA: Diagnosis not present

## 2016-08-24 DIAGNOSIS — S336XXA Sprain of sacroiliac joint, initial encounter: Secondary | ICD-10-CM | POA: Diagnosis not present

## 2016-08-24 DIAGNOSIS — M50321 Other cervical disc degeneration at C4-C5 level: Secondary | ICD-10-CM | POA: Diagnosis not present

## 2016-08-24 DIAGNOSIS — M9903 Segmental and somatic dysfunction of lumbar region: Secondary | ICD-10-CM | POA: Diagnosis not present

## 2016-08-24 DIAGNOSIS — S338XXA Sprain of other parts of lumbar spine and pelvis, initial encounter: Secondary | ICD-10-CM | POA: Diagnosis not present

## 2016-08-24 DIAGNOSIS — M9905 Segmental and somatic dysfunction of pelvic region: Secondary | ICD-10-CM | POA: Diagnosis not present

## 2016-08-24 DIAGNOSIS — M5137 Other intervertebral disc degeneration, lumbosacral region: Secondary | ICD-10-CM | POA: Diagnosis not present

## 2016-08-27 DIAGNOSIS — M9903 Segmental and somatic dysfunction of lumbar region: Secondary | ICD-10-CM | POA: Diagnosis not present

## 2016-08-27 DIAGNOSIS — M5137 Other intervertebral disc degeneration, lumbosacral region: Secondary | ICD-10-CM | POA: Diagnosis not present

## 2016-08-27 DIAGNOSIS — M9904 Segmental and somatic dysfunction of sacral region: Secondary | ICD-10-CM | POA: Diagnosis not present

## 2016-08-27 DIAGNOSIS — M50321 Other cervical disc degeneration at C4-C5 level: Secondary | ICD-10-CM | POA: Diagnosis not present

## 2016-08-27 DIAGNOSIS — M9905 Segmental and somatic dysfunction of pelvic region: Secondary | ICD-10-CM | POA: Diagnosis not present

## 2016-08-27 DIAGNOSIS — S336XXA Sprain of sacroiliac joint, initial encounter: Secondary | ICD-10-CM | POA: Diagnosis not present

## 2016-08-27 DIAGNOSIS — S338XXA Sprain of other parts of lumbar spine and pelvis, initial encounter: Secondary | ICD-10-CM | POA: Diagnosis not present

## 2016-08-27 DIAGNOSIS — M9901 Segmental and somatic dysfunction of cervical region: Secondary | ICD-10-CM | POA: Diagnosis not present

## 2016-08-31 DIAGNOSIS — M9905 Segmental and somatic dysfunction of pelvic region: Secondary | ICD-10-CM | POA: Diagnosis not present

## 2016-08-31 DIAGNOSIS — M9903 Segmental and somatic dysfunction of lumbar region: Secondary | ICD-10-CM | POA: Diagnosis not present

## 2016-08-31 DIAGNOSIS — M9904 Segmental and somatic dysfunction of sacral region: Secondary | ICD-10-CM | POA: Diagnosis not present

## 2016-08-31 DIAGNOSIS — S338XXA Sprain of other parts of lumbar spine and pelvis, initial encounter: Secondary | ICD-10-CM | POA: Diagnosis not present

## 2016-08-31 DIAGNOSIS — S336XXA Sprain of sacroiliac joint, initial encounter: Secondary | ICD-10-CM | POA: Diagnosis not present

## 2016-08-31 DIAGNOSIS — M5137 Other intervertebral disc degeneration, lumbosacral region: Secondary | ICD-10-CM | POA: Diagnosis not present

## 2016-09-03 DIAGNOSIS — M5137 Other intervertebral disc degeneration, lumbosacral region: Secondary | ICD-10-CM | POA: Diagnosis not present

## 2016-09-03 DIAGNOSIS — S336XXA Sprain of sacroiliac joint, initial encounter: Secondary | ICD-10-CM | POA: Diagnosis not present

## 2016-09-03 DIAGNOSIS — M9903 Segmental and somatic dysfunction of lumbar region: Secondary | ICD-10-CM | POA: Diagnosis not present

## 2016-09-03 DIAGNOSIS — M9901 Segmental and somatic dysfunction of cervical region: Secondary | ICD-10-CM | POA: Diagnosis not present

## 2016-09-03 DIAGNOSIS — M9904 Segmental and somatic dysfunction of sacral region: Secondary | ICD-10-CM | POA: Diagnosis not present

## 2016-09-03 DIAGNOSIS — M50321 Other cervical disc degeneration at C4-C5 level: Secondary | ICD-10-CM | POA: Diagnosis not present

## 2016-09-03 DIAGNOSIS — M9905 Segmental and somatic dysfunction of pelvic region: Secondary | ICD-10-CM | POA: Diagnosis not present

## 2016-09-03 DIAGNOSIS — S338XXA Sprain of other parts of lumbar spine and pelvis, initial encounter: Secondary | ICD-10-CM | POA: Diagnosis not present

## 2016-09-07 DIAGNOSIS — I482 Chronic atrial fibrillation: Secondary | ICD-10-CM | POA: Diagnosis not present

## 2016-09-07 DIAGNOSIS — Z7901 Long term (current) use of anticoagulants: Secondary | ICD-10-CM | POA: Diagnosis not present

## 2016-09-08 DIAGNOSIS — L905 Scar conditions and fibrosis of skin: Secondary | ICD-10-CM | POA: Diagnosis not present

## 2016-09-08 DIAGNOSIS — Z85828 Personal history of other malignant neoplasm of skin: Secondary | ICD-10-CM | POA: Diagnosis not present

## 2016-09-08 DIAGNOSIS — D692 Other nonthrombocytopenic purpura: Secondary | ICD-10-CM | POA: Diagnosis not present

## 2016-09-08 DIAGNOSIS — D1801 Hemangioma of skin and subcutaneous tissue: Secondary | ICD-10-CM | POA: Diagnosis not present

## 2016-09-08 DIAGNOSIS — L570 Actinic keratosis: Secondary | ICD-10-CM | POA: Diagnosis not present

## 2016-09-08 DIAGNOSIS — L821 Other seborrheic keratosis: Secondary | ICD-10-CM | POA: Diagnosis not present

## 2016-09-10 DIAGNOSIS — M9903 Segmental and somatic dysfunction of lumbar region: Secondary | ICD-10-CM | POA: Diagnosis not present

## 2016-09-10 DIAGNOSIS — M9905 Segmental and somatic dysfunction of pelvic region: Secondary | ICD-10-CM | POA: Diagnosis not present

## 2016-09-10 DIAGNOSIS — S336XXA Sprain of sacroiliac joint, initial encounter: Secondary | ICD-10-CM | POA: Diagnosis not present

## 2016-09-10 DIAGNOSIS — M9901 Segmental and somatic dysfunction of cervical region: Secondary | ICD-10-CM | POA: Diagnosis not present

## 2016-09-10 DIAGNOSIS — M5137 Other intervertebral disc degeneration, lumbosacral region: Secondary | ICD-10-CM | POA: Diagnosis not present

## 2016-09-10 DIAGNOSIS — M50321 Other cervical disc degeneration at C4-C5 level: Secondary | ICD-10-CM | POA: Diagnosis not present

## 2016-09-10 DIAGNOSIS — M9904 Segmental and somatic dysfunction of sacral region: Secondary | ICD-10-CM | POA: Diagnosis not present

## 2016-09-10 DIAGNOSIS — S338XXA Sprain of other parts of lumbar spine and pelvis, initial encounter: Secondary | ICD-10-CM | POA: Diagnosis not present

## 2016-09-17 DIAGNOSIS — M9901 Segmental and somatic dysfunction of cervical region: Secondary | ICD-10-CM | POA: Diagnosis not present

## 2016-09-17 DIAGNOSIS — M9903 Segmental and somatic dysfunction of lumbar region: Secondary | ICD-10-CM | POA: Diagnosis not present

## 2016-09-17 DIAGNOSIS — S338XXA Sprain of other parts of lumbar spine and pelvis, initial encounter: Secondary | ICD-10-CM | POA: Diagnosis not present

## 2016-09-17 DIAGNOSIS — M5137 Other intervertebral disc degeneration, lumbosacral region: Secondary | ICD-10-CM | POA: Diagnosis not present

## 2016-09-17 DIAGNOSIS — M9905 Segmental and somatic dysfunction of pelvic region: Secondary | ICD-10-CM | POA: Diagnosis not present

## 2016-09-17 DIAGNOSIS — M50321 Other cervical disc degeneration at C4-C5 level: Secondary | ICD-10-CM | POA: Diagnosis not present

## 2016-09-17 DIAGNOSIS — S336XXA Sprain of sacroiliac joint, initial encounter: Secondary | ICD-10-CM | POA: Diagnosis not present

## 2016-09-17 DIAGNOSIS — M9904 Segmental and somatic dysfunction of sacral region: Secondary | ICD-10-CM | POA: Diagnosis not present

## 2016-09-24 DIAGNOSIS — M50321 Other cervical disc degeneration at C4-C5 level: Secondary | ICD-10-CM | POA: Diagnosis not present

## 2016-09-24 DIAGNOSIS — M9901 Segmental and somatic dysfunction of cervical region: Secondary | ICD-10-CM | POA: Diagnosis not present

## 2016-09-24 DIAGNOSIS — M9905 Segmental and somatic dysfunction of pelvic region: Secondary | ICD-10-CM | POA: Diagnosis not present

## 2016-09-24 DIAGNOSIS — S338XXA Sprain of other parts of lumbar spine and pelvis, initial encounter: Secondary | ICD-10-CM | POA: Diagnosis not present

## 2016-09-24 DIAGNOSIS — S336XXA Sprain of sacroiliac joint, initial encounter: Secondary | ICD-10-CM | POA: Diagnosis not present

## 2016-09-24 DIAGNOSIS — M9904 Segmental and somatic dysfunction of sacral region: Secondary | ICD-10-CM | POA: Diagnosis not present

## 2016-09-24 DIAGNOSIS — M9903 Segmental and somatic dysfunction of lumbar region: Secondary | ICD-10-CM | POA: Diagnosis not present

## 2016-09-24 DIAGNOSIS — M5137 Other intervertebral disc degeneration, lumbosacral region: Secondary | ICD-10-CM | POA: Diagnosis not present

## 2016-10-01 DIAGNOSIS — M5137 Other intervertebral disc degeneration, lumbosacral region: Secondary | ICD-10-CM | POA: Diagnosis not present

## 2016-10-01 DIAGNOSIS — M9904 Segmental and somatic dysfunction of sacral region: Secondary | ICD-10-CM | POA: Diagnosis not present

## 2016-10-01 DIAGNOSIS — S338XXA Sprain of other parts of lumbar spine and pelvis, initial encounter: Secondary | ICD-10-CM | POA: Diagnosis not present

## 2016-10-01 DIAGNOSIS — M9903 Segmental and somatic dysfunction of lumbar region: Secondary | ICD-10-CM | POA: Diagnosis not present

## 2016-10-01 DIAGNOSIS — M9905 Segmental and somatic dysfunction of pelvic region: Secondary | ICD-10-CM | POA: Diagnosis not present

## 2016-10-01 DIAGNOSIS — M50321 Other cervical disc degeneration at C4-C5 level: Secondary | ICD-10-CM | POA: Diagnosis not present

## 2016-10-01 DIAGNOSIS — S336XXA Sprain of sacroiliac joint, initial encounter: Secondary | ICD-10-CM | POA: Diagnosis not present

## 2016-10-01 DIAGNOSIS — M9901 Segmental and somatic dysfunction of cervical region: Secondary | ICD-10-CM | POA: Diagnosis not present

## 2016-10-05 DIAGNOSIS — Z7901 Long term (current) use of anticoagulants: Secondary | ICD-10-CM | POA: Diagnosis not present

## 2016-10-05 DIAGNOSIS — I482 Chronic atrial fibrillation: Secondary | ICD-10-CM | POA: Diagnosis not present

## 2016-10-07 DIAGNOSIS — G4733 Obstructive sleep apnea (adult) (pediatric): Secondary | ICD-10-CM | POA: Diagnosis not present

## 2016-10-07 DIAGNOSIS — R69 Illness, unspecified: Secondary | ICD-10-CM | POA: Diagnosis not present

## 2016-10-08 DIAGNOSIS — M9901 Segmental and somatic dysfunction of cervical region: Secondary | ICD-10-CM | POA: Diagnosis not present

## 2016-10-08 DIAGNOSIS — S336XXA Sprain of sacroiliac joint, initial encounter: Secondary | ICD-10-CM | POA: Diagnosis not present

## 2016-10-08 DIAGNOSIS — M5137 Other intervertebral disc degeneration, lumbosacral region: Secondary | ICD-10-CM | POA: Diagnosis not present

## 2016-10-08 DIAGNOSIS — M9903 Segmental and somatic dysfunction of lumbar region: Secondary | ICD-10-CM | POA: Diagnosis not present

## 2016-10-08 DIAGNOSIS — M9905 Segmental and somatic dysfunction of pelvic region: Secondary | ICD-10-CM | POA: Diagnosis not present

## 2016-10-08 DIAGNOSIS — M9904 Segmental and somatic dysfunction of sacral region: Secondary | ICD-10-CM | POA: Diagnosis not present

## 2016-10-08 DIAGNOSIS — M50321 Other cervical disc degeneration at C4-C5 level: Secondary | ICD-10-CM | POA: Diagnosis not present

## 2016-10-08 DIAGNOSIS — S338XXA Sprain of other parts of lumbar spine and pelvis, initial encounter: Secondary | ICD-10-CM | POA: Diagnosis not present

## 2016-10-12 DIAGNOSIS — R69 Illness, unspecified: Secondary | ICD-10-CM | POA: Diagnosis not present

## 2016-10-12 DIAGNOSIS — I482 Chronic atrial fibrillation: Secondary | ICD-10-CM | POA: Diagnosis not present

## 2016-10-15 DIAGNOSIS — M9901 Segmental and somatic dysfunction of cervical region: Secondary | ICD-10-CM | POA: Diagnosis not present

## 2016-10-15 DIAGNOSIS — S336XXA Sprain of sacroiliac joint, initial encounter: Secondary | ICD-10-CM | POA: Diagnosis not present

## 2016-10-15 DIAGNOSIS — M9903 Segmental and somatic dysfunction of lumbar region: Secondary | ICD-10-CM | POA: Diagnosis not present

## 2016-10-15 DIAGNOSIS — M5137 Other intervertebral disc degeneration, lumbosacral region: Secondary | ICD-10-CM | POA: Diagnosis not present

## 2016-10-15 DIAGNOSIS — M9905 Segmental and somatic dysfunction of pelvic region: Secondary | ICD-10-CM | POA: Diagnosis not present

## 2016-10-15 DIAGNOSIS — S338XXA Sprain of other parts of lumbar spine and pelvis, initial encounter: Secondary | ICD-10-CM | POA: Diagnosis not present

## 2016-10-15 DIAGNOSIS — M50321 Other cervical disc degeneration at C4-C5 level: Secondary | ICD-10-CM | POA: Diagnosis not present

## 2016-10-15 DIAGNOSIS — M9904 Segmental and somatic dysfunction of sacral region: Secondary | ICD-10-CM | POA: Diagnosis not present

## 2016-10-22 DIAGNOSIS — M9904 Segmental and somatic dysfunction of sacral region: Secondary | ICD-10-CM | POA: Diagnosis not present

## 2016-10-22 DIAGNOSIS — M9903 Segmental and somatic dysfunction of lumbar region: Secondary | ICD-10-CM | POA: Diagnosis not present

## 2016-10-22 DIAGNOSIS — M9905 Segmental and somatic dysfunction of pelvic region: Secondary | ICD-10-CM | POA: Diagnosis not present

## 2016-10-22 DIAGNOSIS — M5137 Other intervertebral disc degeneration, lumbosacral region: Secondary | ICD-10-CM | POA: Diagnosis not present

## 2016-10-22 DIAGNOSIS — S338XXA Sprain of other parts of lumbar spine and pelvis, initial encounter: Secondary | ICD-10-CM | POA: Diagnosis not present

## 2016-10-22 DIAGNOSIS — S336XXA Sprain of sacroiliac joint, initial encounter: Secondary | ICD-10-CM | POA: Diagnosis not present

## 2016-10-22 DIAGNOSIS — M50321 Other cervical disc degeneration at C4-C5 level: Secondary | ICD-10-CM | POA: Diagnosis not present

## 2016-10-22 DIAGNOSIS — M9901 Segmental and somatic dysfunction of cervical region: Secondary | ICD-10-CM | POA: Diagnosis not present

## 2016-10-28 ENCOUNTER — Encounter: Payer: Self-pay | Admitting: Pulmonary Disease

## 2016-10-29 ENCOUNTER — Encounter: Payer: Self-pay | Admitting: Pulmonary Disease

## 2016-10-29 ENCOUNTER — Ambulatory Visit (INDEPENDENT_AMBULATORY_CARE_PROVIDER_SITE_OTHER): Payer: Medicare HMO | Admitting: Pulmonary Disease

## 2016-10-29 VITALS — BP 124/88 | HR 89 | Temp 97.3°F | Ht 71.0 in | Wt 285.4 lb

## 2016-10-29 DIAGNOSIS — Z9989 Dependence on other enabling machines and devices: Secondary | ICD-10-CM

## 2016-10-29 DIAGNOSIS — G4733 Obstructive sleep apnea (adult) (pediatric): Secondary | ICD-10-CM

## 2016-10-29 DIAGNOSIS — I10 Essential (primary) hypertension: Secondary | ICD-10-CM | POA: Diagnosis not present

## 2016-10-29 DIAGNOSIS — I2510 Atherosclerotic heart disease of native coronary artery without angina pectoris: Secondary | ICD-10-CM

## 2016-10-29 DIAGNOSIS — J449 Chronic obstructive pulmonary disease, unspecified: Secondary | ICD-10-CM | POA: Diagnosis not present

## 2016-10-29 DIAGNOSIS — Z951 Presence of aortocoronary bypass graft: Secondary | ICD-10-CM | POA: Diagnosis not present

## 2016-10-29 DIAGNOSIS — I480 Paroxysmal atrial fibrillation: Secondary | ICD-10-CM

## 2016-10-29 DIAGNOSIS — I5022 Chronic systolic (congestive) heart failure: Secondary | ICD-10-CM

## 2016-10-29 MED ORDER — UMECLIDINIUM BROMIDE 62.5 MCG/INH IN AEPB: 1.0000 | INHALATION_SPRAY | Freq: Every day | RESPIRATORY_TRACT | 0 refills | Status: AC

## 2016-10-29 NOTE — Patient Instructions (Signed)
Today we updated your med list in our EPIC system...    Continue your current medications the same...  Your breathing is stable on your inhalers & you have not had a COPD exacerbation!  Further improvement will come with weight reduction!!!  Try the OTC MUCINEX 600mg  one tab up to 4 times daily w/ water for the congestion & thick mucus...  Your CPAP download looks good- keep up the good work...  Call for any questions or if we can be of service in any way...  Let's plan a follow up visit in 31mo, sooner if needed for problems.Marland KitchenMarland Kitchen

## 2016-10-29 NOTE — Progress Notes (Signed)
Subjective:     Patient ID: Tommy Riley, male   DOB: March 13, 1946, 71 y.o.   MRN: [id number]  HPI ~  December 14, 2014:  Initial consult w/ SN>   71 y/o WM, from [place], self referred for a pulmonary second opinion>  His local PCP is DrPomposini, his Cardiologist is Secretary/administrator, and his Pulmonologist is DrKBird;  He has multisystem disease w/ OSA, obesity, and COPD; plus HBP, CAD- s/pCABG, AFib, Hyperlipidemia, DM, renal insuffic, Hx stroke, and arthritis...  From the pulmonary standpoint- he has OSA & on CPAP x yrs, doing satis w/ his machine & mask, rests well & denies daytime sleepiness issues;  He is currently on 3 inhalers> Pulmicort-2spBid, Spiriva daily, and Proair rescue inhaler but having to use this 3-4 times daily; he was prev on Symbicort160-2spBid but this was stopped after a Neuro eval by DrAthar who thought his LABA was contributing to the shaking & the tremor has improved off the Formoterol (interesting that it has NOT returned w/ incr use of the Albuterol in Proair);  He is c/o increased SOB & feels it is getting worse, specifically notes DOE w/ min activity & ADLs like showering, dressing, min work about the house; on further questioning it is apparent that his increased DOE has paralleled his increased sedentary lifestyle; he tells me he was Dx w/ CAD & had a 3 vessel CABG in 2001 after which he participated in cardiac rehab & he was apparently doing quite well at that time but developed AFib, had several TIAs, and retired on disability in 2003; since then he has had an increasingly sedentary lifestyle with progressive DOE despite his cardiac & pulmonary meds...       PULM> Tommy Riley is an ex-smoker, having started in his teens and smoked for 40+yrs up to 1-2ppd in his adult life, and quit in 2012 at age 71 or so (I est a 80 pack-year history);  He notes SOB as above- plus cough, small amt of thick beige sputum that is hard to expectorate, no hemoptysis & no CP; he related a hx of one prev  bronchoscopy w/ lavage ~10 yrs ago due to "thick phlegm & plugs that had to be sucked out";  He denies hx of asthma in the past but he says he's had pneumonia x3 in the past including double pneumonia when he was in the service in [place]; he says he was first diagnosed w/ COPD about 3 yrs ago... 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 FamHx is neg for any respiratory illnesses... We have requested old records from DrBird including prev CXRs, CT Chest, FullPFTs, ABGs, etc... Current Meds> Pulmicort-2spBid, Spiriva daily, and Proair rescue inhaler but having to use this 3-4 times daily.      CARDS> He has a hx of mult coronary risk factors- HBP, DM, HL; he had ?MI 2001 w/ subseq three vessel CABG; he developed AFib & had several cardioversions w/o success;  Records are avail in [place] from [place] in 2011- he had EP studies and pulmonary vein isolation procedure which was transiently successful but the AFib recurred shortly thereafter (his fatigue & decr exercise tolerance where attributed to the AFib); a cardiac MRI at that time is said to show an EF=45-50% w/ marked LAE & no thrombus, but evid for mult subendocardial infarcts in all  3 coronary art distributions; he didn't tol Tykosyn & was restarted on Amio... We have requested records from [facility] including his most recent 2DEcho (pt says his heart was at 37%) & and cath data.  Current Meds> MetopER50Bid, Cardizem120, Demadex20Qam (extra prn), Aldactone100, ?K20, ASA/ Plavix/ Coumadin...      EXAM showed Afeb, VSS, O2sat=95% on RA; he is obese- 6' Tall, 275#, BMI=37;  HEENT- short/ heavy neck, mallampati3;  Chest- decr BS w/ few scat rhonchi at bases, no wheezing or rales;  Heart- irreg gr1/6 SEM w/o r/g;  Abd- obese, soft, neg;  Ext- TEDs, 1+edema  CXR 12/14/14 showed cardiomegaly,  prior CABG, clear lungs except for some bibasilar scarring, NAD...   Spirometry 12/14/14 showed FVC=2.73 (56%), FEV1=1.63 (43%), %1sec=60, mid-flows are reduced at 24% predicted; this is c/w mod airflow obstruction and GOLD Stage3 COPD...   Ambulatory oxygen saturation test 12/2014> O2sat=95% on RA at rest w/ pulse=97/min;  He walked 2 laps in out office w/ lowest O2sat=93% w/ pulse=111/min & stopped due to SOB & fatigue.  LABS 12/2014:  Chems- ok x BS=141, BUN=46, Cr=1.6;  CBC- wnl w/ Hg=16.8;  BNP=72;  Sed=20;  TSH=1.37       IMP/PLAN>>   We took extra time to explain the multifactorial nature of his dyspnea- pulm/ cardiac/ deconditioning;  From the pulmonary standpoint we decided to change the Pulmicort to ADVAIR500-Bid, continue the [facility] daily, continue the [facility] rescue inhaler but hopefully he will be able to decr it's use;  He can also use MUCINEX 639mQid w/ fluids for the thick phlegm;  From the cardiac perspective we have [place] records [place](esp the most recent 2DEcho to check EF & PA pressure estimates), note that his BNP is norm at 72;  Finally it is imperative that he starts back into a slow gradual exercise program (eg- Silver Sneakers etc) & we discussed this in detail... I have asked him to ret in 4-6wks for recheck & hopefully we will have his old records to review.    ~  January 11, 2015:  154moOV w/ SN>  [name] that he is feeling better on the ADVAIR500Bid, Spiriva daily, & Proair rescue (in fact he hasn't used the PrIAC/InterActiveCorpince his last visit!);  We have not received any records from [place] Cardiology [place] interested in [place] 2[place] results)- we will request these again;  He has inquired at his local hosp but they no longer offer pulm rehab program, however he [facility] and we will call to arrange this for him... Unfortunately Tommy Riley gained a few lbs but admits to not  taking the diet & exercise prescription seriously enough- he will re-double his efforts in this regard...      EXAM reveals Afeb, VSS, O2sat=92% on RA, wt=288#, 5'11" Tall, BMI=40;  HEENT- short neck, mallampati3;  Chest- decr BS at bases otherw clear;  Heart- irreg, gr1/6 SEM, w/o r/g;  Abd- obese, soft, neg;  Ext- TEDs, 1+edema...       IMP/PLAN>>  Tommy Riley improved on the Advair500, Spiriva, Proair (hasn't used this in the last month); OK to wean to the Avdair250Bid... Diet & exercise are critically important here & we reviewed this...  We will refer to pulm rehab via [place]and we will try again to get cardi[place].    ~  April 18, 2015:  45m545moV w/  SN>  Arrington returns & reports a good interval- stable on his Advair250, Spiriva, ProventilHFA rescue (using this <1/mo);  His wt is down 5# to 282# today but wife notes still not dieting satis & not exercising regularly;  Recall his hx of HBP, CAD, s/pCABG so diet & exercise are critical for this system as well...      OSA on CPAP> he reports on CPAP for yrs, ?set at 28?, doing satis by his description, we don't have data from his doctors in [place]...      COPD, ex-smoker> on Advair250Bid, spiriva daily, ProventilHFA rescue prn; he quit smoking in 2012; Spirometry 6/16 showed mod airflow obstruction w/ GOLD Stage3 COPD w/ FEV1=1.63 (43%); CXR w/ cardiomeg, prev CABG, clear lungs w/ some scarring at bases.      Dyspnea, Severe deconditioning> he hasn't take the advise to start exercise program so far; we reviewed this again!      HBP, CAD, s/pCABG, Hx stroke, AFib on coumadin> pt reports EF=37%> on ASA81, Plavix75, MetopER50Bid, Cardizem120, Demadex20Bid, Aldactone100, & Coumadin (?triple antiplat/ anticoag therapy?); followed by Cards in [place] we never received their records to review...       HL, DM on insulin, Obesity, RI> on Cres20, Niacin500bid, Lovaza, Levemir insulin (80u daily), Allopurinol100;  he tells me that his last A1c was 6.8 but we do not have records from [place]; Cr here 6/16 was 1.6...  We reviewed prob list, meds, xrays and labs>  IMP/PLAN>>  Bobbie is stable on above therapy; I would love for him to get medical records from his [place] team for [place] to review; we had a frank conversation indicating that further improvement is totally dependent on diet/ exercise/ & wt reduction- he understands...   ~  October 17, 2015:  82moROV w/ SN>  MRuddyreports a good interval w/o new complaints or concerns- he is trying to eat less "no sweets" & in fact is down 13# to 279# today; breathing is unchanged, SOB at baseline & again asked to increase his exercise program;  He notes occas bouts w/ cough, chest congestion, and light gray mucus => we discussed ZPak for prn use...       OSA on CPAP> he reports on CPAP for yrs, ?set at 148, doing satis by his description, we don't have data [place]..      COPD, ex-smoker> on Advair250Bid, Spiriva daily, ProventilHFA rescue prn; he quit smoking in 2012; Spirometry 6/16 showed mod airflow obstruction w/ GOLD Stage3 COPD w/ FEV1=1.63 (43%); CXR w/ cardiomeg, prev CABG, clear lungs w/ some scarring at bases.      Dyspnea, Severe deconditioning> he hasn't take the advise to start exercise program so far; we reviewed this again!      HBP, CAD, s/pCABG, ischemic cardiomyopathy- EF=30%, Hx stroke, AFib on coumadin> pt reports EF=37%> on ASA81, Plavix75, MetopER50Bid, Cardizem120, Demadex20Bid, Aldactone100, & Coumadin (?triple antiplat/ anticoag therapy?); [place] review...       HL, DM on insulin, Obesity, RI> off Cres20 & on Niacin500bid, Lovaza, Levemir insulin (80u daily), Allopurinol100; he tells me that his last A1c was 6.8 but we [place] Cr here 6/16 was 1.6...  [facility] CKD- stage3 w/ Cr=1.20 and stable...    EXAM reveals Afeb, VSS, O2sat=96% on RA, wt=279#, 5'11" Tall, BMI=38;  HEENT- short neck, mallampati3;  Chest- decr BS at bases otherw  clear;  Heart- irreg, gr1/6 SEM, w/o r/g;  Abd- obese, soft, neg;  Ext- TEDs, tr edema...  IMP/PLAN>>  Dustan is stable on his Advair250Bid & Spiriva daily; hasn't needed his Albut rescue inhaler at all; we discussed ZPak for prn use & Rx provided; he will check w/ his PCP regarding vaccination schedule to be sure he is up-to-date; he definitely needs to increase his exercise program & keep up the good work w/ wt reduction... We plan ROV in 39mo sooner if needed for any problems...  ~  April 30, 2016:  637moOV w/ SN>  Tommy Riley & reports that in June2017 he had a rough boat ride w/ his son, bounced around a lot, & developed severe tailbone pain ever since then;  He was evaluated by his PCP & Ortho w/ XRays and an MRI- no fracture detected;  They tried Rx w/ Pred dosepak, Tramadol, Hydrocodone but NOTHING HELPED;  He is sl tender over tailbone in the crack but not exquisitely so- we discussed this & I mentioned they could consider a nuclear bone scan for further eval, in the meanwhile- try rest/ HEAT/ plus combination of TRAMADOL50 + Extra strength Tylenol TID for the pain... we reviewed the following medical problems during today's office visit >>       OSA on CPAP> on CP[place]rs [place]set [place] 1733 doing satis by his description, we don't have download data avail to review, asked to bring download from his DME on ret...      COPD, ex-smoker> on Advair250Bid, Spiriva daily, ProventilHFA rescue prn; he quit smoking in 2012; Spirometry 6/16 showed mod airflow obstruction w/ GOLD Stage3 COPD w/ FEV1=1.63 (43%); CXR w/ cardiomeg, prev CABG, clear lungs w/ some scarring at bases; he has ZPak for prn use in the winter....      Dyspnea, Severe deconditioning> he had lost wt down to 267# he says before the tailbone injury & hasn't been able to  exercise since then; we reviewed the needed DIET/EXERCISE/Wt reduction strategies...      Cardiovasc issues>  HBP, CAD, s/pCABG, ischemic cardiomyopathy- EF=30%, Hx stroke, AFib on coumadin> pt reports EF=37%> on ASA81, Plavix75, MetopER50Bid, Cardizem120, Demadex20Bid, Aldactone100, & Coumadin (?triple antiplat/ anticoag therapy?); [place] to review...       Medical issues>  HL, DM on insulin, Obesity, RI> off Crestor & Niacin, on Lovaza, Levemir insulin (80u daily), Allopurinol100; he tells me that his last A1c was 6.8 & Cr here 6/16 was 1.6; we received note from Nephrology-DrBaveja 08/16/15-- CKD, stage3, w/ Cr 1.5-1.8 over the last 2y37yrut improved to 1.2... Marland KitchenMarland KitchenXAM reveals Afeb, VSS, O2sat=96% on RA, wt=281#, 5'11" Tall, BMI=38;  HEENT- short neck, mallampati3;  Chest- decr BS at bases otherw clear;  Heart- irreg, gr1/6 SEM, w/o r/g;  Abd- obese, soft, neg;  Ext- TEDs, tr edema...  IMP/PLAN>>  Tommy Riley had a set back w/ the tailbone pain- perhaps Ortho could consider a nuclear bone scan if the pain persists; advised REST, apply HEAT, try combination of TRAMADOL50 + extra strength Tylenol TID; from the pulmonary standpoint he needs to get more active, exercise program discussed, and continue the ADVAIR/ [facility]ularly w/ ProairHFA rescue inhaler as needed;  We gave him the 2017 FLU vaccine today & rec that he review w/ his PCP regarding his needed pneumonia vaccinations...    ~  October 29, 2016:  81mo15mo & Tommy Riley  his new ResMed S10 CPAP machine from [facility] in [place] & he is very pleased w/ this equipment;  We got a download from [employer]- excellent compliance, 8H per night, pressure=17cmH2O, no signif leak & AHI on CPAP = 1;  He notes that his breathing is the same- min cough, small amt thick sput & hard to expectorate, persistent SOB & notes that he really can't do anything due to his back & coccyx pain which he notes has  "kicked my butt- literally", also has bulging disc on MRI & right leg shorter than left; he has been under a Chiro care program using a decompression table & improved some "last XRay showed no bulge";  I indicated that hopefully he would be able to return to activity/ exercise to improve his severe deconditioning...     He remains on Advair250Bid, Incruse daily (Spiriva no longer covered by his insurance), ProventilHFA which he averages 1-2 inhalations per day...    From the Cardiac perspective he remains stable on his coumadin for AFib, and ASA81, ToprolXL, CardizemCD, Demadex/ Aldactone/ KCl; we have never received any records from [facility]...    Medical issues include HL, DM on insulin, Obesity, & renal insuffic> PCP= DrPomposini & he has seen Nephrology as well... EXAM reveals Afeb, VSS, O2sat=93% on RA, wt=286#, 5'11" Tall, BMI=38-9;  HEENT- short neck, mallampati3;  Chest- decr BS at bases otherw clear;  Heart- irreg, gr1/6 SEM, w/o r/g;  Abd- obese, soft, neg;  Ext- TEDs, tr edema...  IMP/PLAN>>  As prev mentioned he is asked to get on diet, incr exercise & get weight down;  Continue Advair/ Incruse regularly, start the [facility] '600mg'$  qid w/ fliuds, and use the albutHFA rescue inhaler as needed... He knows to call regarding any breathing issues & we plan rov recheck in 18mo..    Past Medical History  Diagnosis Date  . Heart disease  >> on MetoprololER, Cardizem, Demadex, Aldactone, ?KCl, off prev Lotensin     Atherosclerotic  . Atrial fibrillation >> on ASA, Plavix, Coumadin (?triple antiplat/anticoag therapy?- [place]   . Hypertension   . Diabetes mellitus without complication >> on Levemir insulin   . Atrial fibrillation   . Stroke 2003  . Kidney disease     stage 3  . High cholesterol >> on Crestor, Lovaza, Niacin   . Depression   . COPD (chronic obstructive pulmonary disease)   . OSA (obstructive sleep apnea)     Past Surgical History:  Procedure Laterality Date    . CORONARY ARTERY BYPASS GRAFT    . heart ablation  2011  . triple by pass  09/16/1999    Outpatient Encounter Prescriptions as of 10/29/2016  Medication Sig  . allopurinol (ZYLOPRIM) 100 MG tablet Take 2 tablets in the morning and 1 tablet in the evening  . guaiFENesin (MUCINEX) 600 MG 12 hr tablet Take 600 mg by mouth 4 (four) times daily.  .Marland KitchenACCU-CHEK AVIVA PLUS test strip   . albuterol (PROVENTIL HFA;VENTOLIN HFA) 108 (90 Base) MCG/ACT inhaler Inhale 2 puffs into the lungs every 4 (four) hours as needed for wheezing or shortness of breath. Inhale one to two puffs by mouth as needed  . aspirin 81 MG tablet Take 81 mg by mouth daily.  . Blood Glucose Monitoring Suppl (ONE TOUCH ULTRA 2) W/DEVICE KIT by Does not apply route.  . Cholecalciferol (VITAMIN D3) 2000 UNITS TABS Take by mouth daily.  . clopidogrel (PLAVIX) 75 MG tablet Take 75 mg by  mouth daily.  Marland Kitchen diltiazem (CARDIZEM) 120 MG tablet Take 120 mg by mouth daily.  . Fluticasone-Salmeterol (ADVAIR) 250-50 MCG/DOSE AEPB Inhale 1 puff into the lungs every 12 (twelve) hours.  . folic acid (FOLVITE) 165 MCG tablet Take 400 mcg by mouth daily.  . insulin detemir (LEVEMIR) 100 UNIT/ML injection Inject 80 Units into the skin daily.  . Melatonin 5 MG TABS Take by mouth daily.  . metoprolol succinate (TOPROL-XL) 50 MG 24 hr tablet Take 50 mg by mouth 2 (two) times daily. Take 1 tablet 2 times daily  . Multiple Vitamins-Minerals (CENTRUM SILVER PO) Take 1 tablet by mouth daily.   Marland Kitchen NIACIN PO Take 500 mg by mouth 2 (two) times daily. Take 500 mg two times daily  . omega-3 acid ethyl esters (LOVAZA) 1 G capsule Take by mouth 2 (two) times daily.  . Potassium Chloride (KLOR-CON 10 PO) Take by mouth daily. Extended release  . rosuvastatin (CRESTOR) 20 MG tablet Take 20 mg by mouth daily.  Marland Kitchen spironolactone (ALDACTONE) 100 MG tablet Take 100 mg by mouth daily.  . temazepam (RESTORIL) 15 MG capsule Take 15 mg by mouth at bedtime as needed for  sleep.  Marland Kitchen torsemide (DEMADEX) 20 MG tablet Take 20 mg by mouth 2 (two) times daily.  . traMADol (ULTRAM) 50 MG tablet Take 1 tablet by mouth three times daily with tylenol as directed (Patient not taking: Reported on 10/29/2016)  . umeclidinium bromide (INCRUSE ELLIPTA) 62.5 MCG/INH AEPB Inhale 1 puff into the lungs daily.  Marland Kitchen umeclidinium bromide (INCRUSE ELLIPTA) 62.5 MCG/INH AEPB Inhale 1 puff into the lungs daily.  Marland Kitchen warfarin (COUMADIN) 3 MG tablet Take 3 mg by mouth daily. And take 1 mg on Monday, Wednesday and Friday   No facility-administered encounter medications on file as of 10/29/2016.     Allergies  Allergen Reactions  . Codeine Anaphylaxis    Tightness in chest    Immunization History  Administered Date(s) Administered  . Influenza, High Dose Seasonal PF 04/30/2016  . Influenza,inj,Quad PF,36+ Mos 05/08/2015  . Influenza-Unspecified 04/14/2014  . Pneumococcal Conjugate-13 04/14/2014    Current Medications, Allergies, Past Medical History, Past Surgical History, Family History, and Social History were reviewed in [employer] record.   Review of Systems            All symptoms NEG except where BOLDED >>  Constitutional:  F/C/S, fatigue, anorexia, unexpected weight change. HEENT:  HA, visual changes, hearing loss, earache, nasal symptoms, sore throat, mouth sores, hoarseness. Resp:  cough, sputum, hemoptysis; SOB, tightness, wheezing. Cardio:  CP, palpit, DOE, orthopnea, edema. GI:  N/V/D/C, blood in stool; reflux, abd pain, distention, gas. GU:  dysuria, freq, urgency, hematuria, flank pain, voiding difficulty. MS:  joint pain, swelling, tenderness, decr ROM; neck pain, back pain, etc. Neuro:  HA, tremors, seizures, dizziness, syncope, weakness, numbness, gait abn. Skin:  suspicious lesions or skin rash. Heme:  adenopathy, bruising, bleeding. Psyche:  confusion, agitation, sleep disturbance, hallucinations, anxiety, depression  suicidal.   Objective:   Physical Exam      Vital Signs:  Reviewed...   General:  WD, obese, 71 y/o WM in NAD; alert & oriented; pleasant & cooperative... HEENT:  [place]/AT; Conjunctiva- pink, Sclera- nonicteric, EOM-wnl, PERRLA, EACs-clear, TMs-wnl; NOSE-clear; THROAT-clear & wnl.  Neck:  Supple w/ decr ROM; no JVD; normal carotid impulses w/o bruits; no thyromegaly or nodules palpated; no lymphadenopathy.  Chest:  decr BS at bases but no wheezing, rales, rhonchi, or signs of consolidation.Marland KitchenMarland Kitchen  Heart:  Irreg, AFib, Gr1/6 SEM w/o rubs or gallops detected... Abdomen:  Soft & nontender- no guarding or rebound; normal bowel sounds; no organomegaly or masses palpated. Ext:  decrROM; +arthritic changes; no varicose veins, +venous insuffic, 1+ edema;  Pulses intact w/o bruits. Neuro:  No focal neuro deficits,gait normal & balance OK. Derm:  No lesions noted; no rash etc. Lymph:  No cervical, supraclavicular, axillary, or inguinal adenopathy palpated.   Assessment:      IMP >>       OSA on CPAP> on CPAP x yrs per his physicians in [place] => continue same & we encouraged compliance...      COPD, ex-smoker> on Advair250Bid, Spiriva daily, ProventilHFA rescue prn; he quit smoking in 2012; Spirometry 6/16 showed mod airflow obstruction w/ GOLD Stage3 COPD w/ FEV1=1.63 (43%); CXR w/ cardiomeg, prev CABG, clear lungs w/ some scarring at bases; he has ZPak for prn use in the winter....      Dyspnea, Severe deconditioning> he had lost wt down to 267# he says before the tailbone injury & hasn't been able to exercise since then; we reviewed the needed DIET/EXERCISE/Wt reduction strategies...      Cardiovasc issues>  HBP, CAD, s/pCABG, ischemic cardiomyopathy- EF=30%, Hx stroke, AFib on coumadin> pt reports EF=37%> on ASA81, Plavix75, MetopER50Bid, Cardizem120, Demadex20Bid, Aldactone100, & Coumadin (?triple antiplat/ anticoag therapy?); followed by Cards in [place] we never received their records to  review...       Medical issues>  HL, DM on insulin, Obesity, RI, tailbone injury 12/2015> off Crestor & Niacin, on Lovaza, Levemir insulin (80u daily), Allopurinol100; he tells me that his last A1c was 6.8 & Cr here 6/16 was 1.6; we received note from Nephrology-DrBaveja 08/16/15-- CKD, stage3, w/ Cr 1.5-1.8 over the last 39yr but improved to 1.2...   PLAN >>   04/18/15>   Tommy Riley stable on above therapy; I would love for him to get medical records from his [place]; we had a frank conversation indicating that further improvement is totally dependent on diet/ exercise/ & wt reduction- he understands; we plan ROV in [id number] if needed prn...  10/17/15>   Tommy Riley stable on his Advair250Bid & Spiriva daily; hasn't needed his Albut rescue inhaler at all; we discussed ZPak for prn use & Rx provided; he will check w/ his PCP regarding vaccination schedule to be sure he is up-to-date; he definitely needs to increase his exercise program 7 keep up the good work w/ wt reduction... 04/30/16>   Tommy Riley had a set back w/ the tailbone pain- perhaps Ortho could consider a nuclear bone scan if the pain persists; advised REST, apply HEAT, try combination of TRAMADOL50 + extra strength Tylenol TID; from the pulmonary standpoint he needs to get more active, exercise program discussed, and continue the ADVIAR/ [facility]ly w/ ProairHFA rescue inhaler as needed;  We gave him the 2017 FLU vaccine today & rec that he review w/ his PCP regarding his needed pneumonia vaccinations 10/29/16>   As prev mentioned he is asked to get on diet, incr exercise & get weight down;  Continue Advair/ Incruse regularly, start the MUCINEX 60062mid w/ fliuds, and use the albutHFA rescue inhaler as needed... He knows to call regarding any breathing issues & we plan rov recheck in 37mo67mo  Plan:     Patient's Medications  New Prescriptions   UMECLIDINIUM BROMIDE (INCRUSE ELLIPTA) 62.5 MCG/INH AEPB    Inhale 1  puff into the lungs daily.  Previous Medications   ACCU-CHEK AVIVA PLUS TEST STRIP       ALBUTEROL (PROVENTIL HFA;VENTOLIN HFA) 108 (90 BASE) MCG/ACT INHALER    Inhale 2 puffs into the lungs every 4 (four) hours as needed for wheezing or shortness of breath. Inhale one to two puffs by mouth as needed   ALLOPURINOL (ZYLOPRIM) 100 MG TABLET    Take 2 tablets in the morning and 1 tablet in the evening   ASPIRIN 81 MG TABLET    Take 81 mg by mouth daily.   BLOOD GLUCOSE MONITORING SUPPL (ONE TOUCH ULTRA 2) W/DEVICE KIT    by Does not apply route.   CHOLECALCIFEROL (VITAMIN D3) 2000 UNITS TABS    Take by mouth daily.   CLOPIDOGREL (PLAVIX) 75 MG TABLET    Take 75 mg by mouth daily.   DILTIAZEM (CARDIZEM) 120 MG TABLET    Take 120 mg by mouth daily.   FLUTICASONE-SALMETEROL (ADVAIR) 250-50 MCG/DOSE AEPB    Inhale 1 puff into the lungs every 12 (twelve) hours.   FOLIC ACID (FOLVITE) 622 MCG TABLET    Take 400 mcg by mouth daily.   GUAIFENESIN (MUCINEX) 600 MG 12 HR TABLET    Take 600 mg by mouth 4 (four) times daily.   INSULIN DETEMIR (LEVEMIR) 100 UNIT/ML INJECTION    Inject 80 Units into the skin daily.   MELATONIN 5 MG TABS    Take by mouth daily.   METOPROLOL SUCCINATE (TOPROL-XL) 50 MG 24 HR TABLET    Take 50 mg by mouth 2 (two) times daily. Take 1 tablet 2 times daily   MULTIPLE VITAMINS-MINERALS (CENTRUM SILVER PO)    Take 1 tablet by mouth daily.    NIACIN PO    Take 500 mg by mouth 2 (two) times daily. Take 500 mg two times daily   OMEGA-3 ACID ETHYL ESTERS (LOVAZA) 1 G CAPSULE    Take by mouth 2 (two) times daily.   POTASSIUM CHLORIDE (KLOR-CON 10 PO)    Take by mouth daily. Extended release   ROSUVASTATIN (CRESTOR) 20 MG TABLET    Take 20 mg by mouth daily.   SPIRONOLACTONE (ALDACTONE) 100 MG TABLET    Take 100 mg by mouth daily.   TEMAZEPAM (RESTORIL) 15 MG CAPSULE    Take 15 mg by mouth at bedtime as needed for sleep.   TORSEMIDE (DEMADEX) 20 MG TABLET    Take 20 mg by mouth 2 (two)  times daily.   TRAMADOL (ULTRAM) 50 MG TABLET    Take 1 tablet by mouth three times daily with tylenol as directed   UMECLIDINIUM BROMIDE (INCRUSE ELLIPTA) 62.5 MCG/INH AEPB    Inhale 1 puff into the lungs daily.   WARFARIN (COUMADIN) 3 MG TABLET    Take 3 mg by mouth daily. And take 1 mg on Monday, Wednesday and Friday  Modified Medications   No medications on file  Discontinued Medications   No medications on file

## 2016-11-02 DIAGNOSIS — Z7901 Long term (current) use of anticoagulants: Secondary | ICD-10-CM | POA: Diagnosis not present

## 2016-11-02 DIAGNOSIS — I482 Chronic atrial fibrillation: Secondary | ICD-10-CM | POA: Diagnosis not present

## 2016-11-05 DIAGNOSIS — M50321 Other cervical disc degeneration at C4-C5 level: Secondary | ICD-10-CM | POA: Diagnosis not present

## 2016-11-05 DIAGNOSIS — M9904 Segmental and somatic dysfunction of sacral region: Secondary | ICD-10-CM | POA: Diagnosis not present

## 2016-11-05 DIAGNOSIS — S338XXA Sprain of other parts of lumbar spine and pelvis, initial encounter: Secondary | ICD-10-CM | POA: Diagnosis not present

## 2016-11-05 DIAGNOSIS — M9903 Segmental and somatic dysfunction of lumbar region: Secondary | ICD-10-CM | POA: Diagnosis not present

## 2016-11-05 DIAGNOSIS — S336XXA Sprain of sacroiliac joint, initial encounter: Secondary | ICD-10-CM | POA: Diagnosis not present

## 2016-11-05 DIAGNOSIS — M9905 Segmental and somatic dysfunction of pelvic region: Secondary | ICD-10-CM | POA: Diagnosis not present

## 2016-11-05 DIAGNOSIS — M5137 Other intervertebral disc degeneration, lumbosacral region: Secondary | ICD-10-CM | POA: Diagnosis not present

## 2016-11-05 DIAGNOSIS — M9901 Segmental and somatic dysfunction of cervical region: Secondary | ICD-10-CM | POA: Diagnosis not present

## 2016-11-06 DIAGNOSIS — G4733 Obstructive sleep apnea (adult) (pediatric): Secondary | ICD-10-CM | POA: Diagnosis not present

## 2016-11-12 DIAGNOSIS — E785 Hyperlipidemia, unspecified: Secondary | ICD-10-CM | POA: Diagnosis not present

## 2016-11-12 DIAGNOSIS — I340 Nonrheumatic mitral (valve) insufficiency: Secondary | ICD-10-CM | POA: Diagnosis not present

## 2016-11-12 DIAGNOSIS — I255 Ischemic cardiomyopathy: Secondary | ICD-10-CM | POA: Diagnosis not present

## 2016-11-12 DIAGNOSIS — I4892 Unspecified atrial flutter: Secondary | ICD-10-CM | POA: Diagnosis not present

## 2016-11-12 DIAGNOSIS — I2510 Atherosclerotic heart disease of native coronary artery without angina pectoris: Secondary | ICD-10-CM | POA: Diagnosis not present

## 2016-11-12 DIAGNOSIS — I872 Venous insufficiency (chronic) (peripheral): Secondary | ICD-10-CM | POA: Diagnosis not present

## 2016-11-12 DIAGNOSIS — I4460 Unspecified fascicular block: Secondary | ICD-10-CM | POA: Diagnosis not present

## 2016-11-12 DIAGNOSIS — I10 Essential (primary) hypertension: Secondary | ICD-10-CM | POA: Diagnosis not present

## 2016-11-16 DIAGNOSIS — I482 Chronic atrial fibrillation: Secondary | ICD-10-CM | POA: Diagnosis not present

## 2016-11-19 DIAGNOSIS — M9903 Segmental and somatic dysfunction of lumbar region: Secondary | ICD-10-CM | POA: Diagnosis not present

## 2016-11-19 DIAGNOSIS — M50321 Other cervical disc degeneration at C4-C5 level: Secondary | ICD-10-CM | POA: Diagnosis not present

## 2016-11-19 DIAGNOSIS — M9901 Segmental and somatic dysfunction of cervical region: Secondary | ICD-10-CM | POA: Diagnosis not present

## 2016-11-19 DIAGNOSIS — S336XXA Sprain of sacroiliac joint, initial encounter: Secondary | ICD-10-CM | POA: Diagnosis not present

## 2016-11-19 DIAGNOSIS — M9905 Segmental and somatic dysfunction of pelvic region: Secondary | ICD-10-CM | POA: Diagnosis not present

## 2016-11-19 DIAGNOSIS — M5137 Other intervertebral disc degeneration, lumbosacral region: Secondary | ICD-10-CM | POA: Diagnosis not present

## 2016-11-19 DIAGNOSIS — S338XXA Sprain of other parts of lumbar spine and pelvis, initial encounter: Secondary | ICD-10-CM | POA: Diagnosis not present

## 2016-11-19 DIAGNOSIS — M9904 Segmental and somatic dysfunction of sacral region: Secondary | ICD-10-CM | POA: Diagnosis not present

## 2016-11-25 DIAGNOSIS — G4733 Obstructive sleep apnea (adult) (pediatric): Secondary | ICD-10-CM | POA: Diagnosis not present

## 2016-11-30 DIAGNOSIS — Z7901 Long term (current) use of anticoagulants: Secondary | ICD-10-CM | POA: Diagnosis not present

## 2016-11-30 DIAGNOSIS — I482 Chronic atrial fibrillation: Secondary | ICD-10-CM | POA: Diagnosis not present

## 2016-12-07 DIAGNOSIS — G4733 Obstructive sleep apnea (adult) (pediatric): Secondary | ICD-10-CM | POA: Diagnosis not present

## 2016-12-07 DIAGNOSIS — Z794 Long term (current) use of insulin: Secondary | ICD-10-CM | POA: Diagnosis not present

## 2016-12-07 DIAGNOSIS — H35371 Puckering of macula, right eye: Secondary | ICD-10-CM | POA: Diagnosis not present

## 2016-12-07 DIAGNOSIS — E119 Type 2 diabetes mellitus without complications: Secondary | ICD-10-CM | POA: Diagnosis not present

## 2016-12-07 DIAGNOSIS — H26492 Other secondary cataract, left eye: Secondary | ICD-10-CM | POA: Diagnosis not present

## 2016-12-17 DIAGNOSIS — G4730 Sleep apnea, unspecified: Secondary | ICD-10-CM | POA: Diagnosis not present

## 2016-12-17 DIAGNOSIS — I259 Chronic ischemic heart disease, unspecified: Secondary | ICD-10-CM | POA: Diagnosis not present

## 2016-12-17 DIAGNOSIS — E119 Type 2 diabetes mellitus without complications: Secondary | ICD-10-CM | POA: Diagnosis not present

## 2016-12-17 DIAGNOSIS — I482 Chronic atrial fibrillation: Secondary | ICD-10-CM | POA: Diagnosis not present

## 2016-12-17 DIAGNOSIS — E785 Hyperlipidemia, unspecified: Secondary | ICD-10-CM | POA: Diagnosis not present

## 2016-12-17 DIAGNOSIS — I639 Cerebral infarction, unspecified: Secondary | ICD-10-CM | POA: Diagnosis not present

## 2016-12-17 DIAGNOSIS — Z6839 Body mass index (BMI) 39.0-39.9, adult: Secondary | ICD-10-CM | POA: Diagnosis not present

## 2016-12-17 DIAGNOSIS — J42 Unspecified chronic bronchitis: Secondary | ICD-10-CM | POA: Diagnosis not present

## 2016-12-17 DIAGNOSIS — R69 Illness, unspecified: Secondary | ICD-10-CM | POA: Diagnosis not present

## 2016-12-17 DIAGNOSIS — I10 Essential (primary) hypertension: Secondary | ICD-10-CM | POA: Diagnosis not present

## 2016-12-21 DIAGNOSIS — I482 Chronic atrial fibrillation: Secondary | ICD-10-CM | POA: Diagnosis not present

## 2016-12-28 DIAGNOSIS — Z7901 Long term (current) use of anticoagulants: Secondary | ICD-10-CM | POA: Diagnosis not present

## 2016-12-28 DIAGNOSIS — I482 Chronic atrial fibrillation: Secondary | ICD-10-CM | POA: Diagnosis not present

## 2017-01-06 DIAGNOSIS — G4733 Obstructive sleep apnea (adult) (pediatric): Secondary | ICD-10-CM | POA: Diagnosis not present

## 2017-01-06 IMAGING — CR DG CHEST 2V
2 series · 2 of 2 positions shown · non-contrast
Comparison: None.

CLINICAL DATA: COPD.

EXAM:
CHEST  2 VIEW

[view not recorded (1 of 2)]
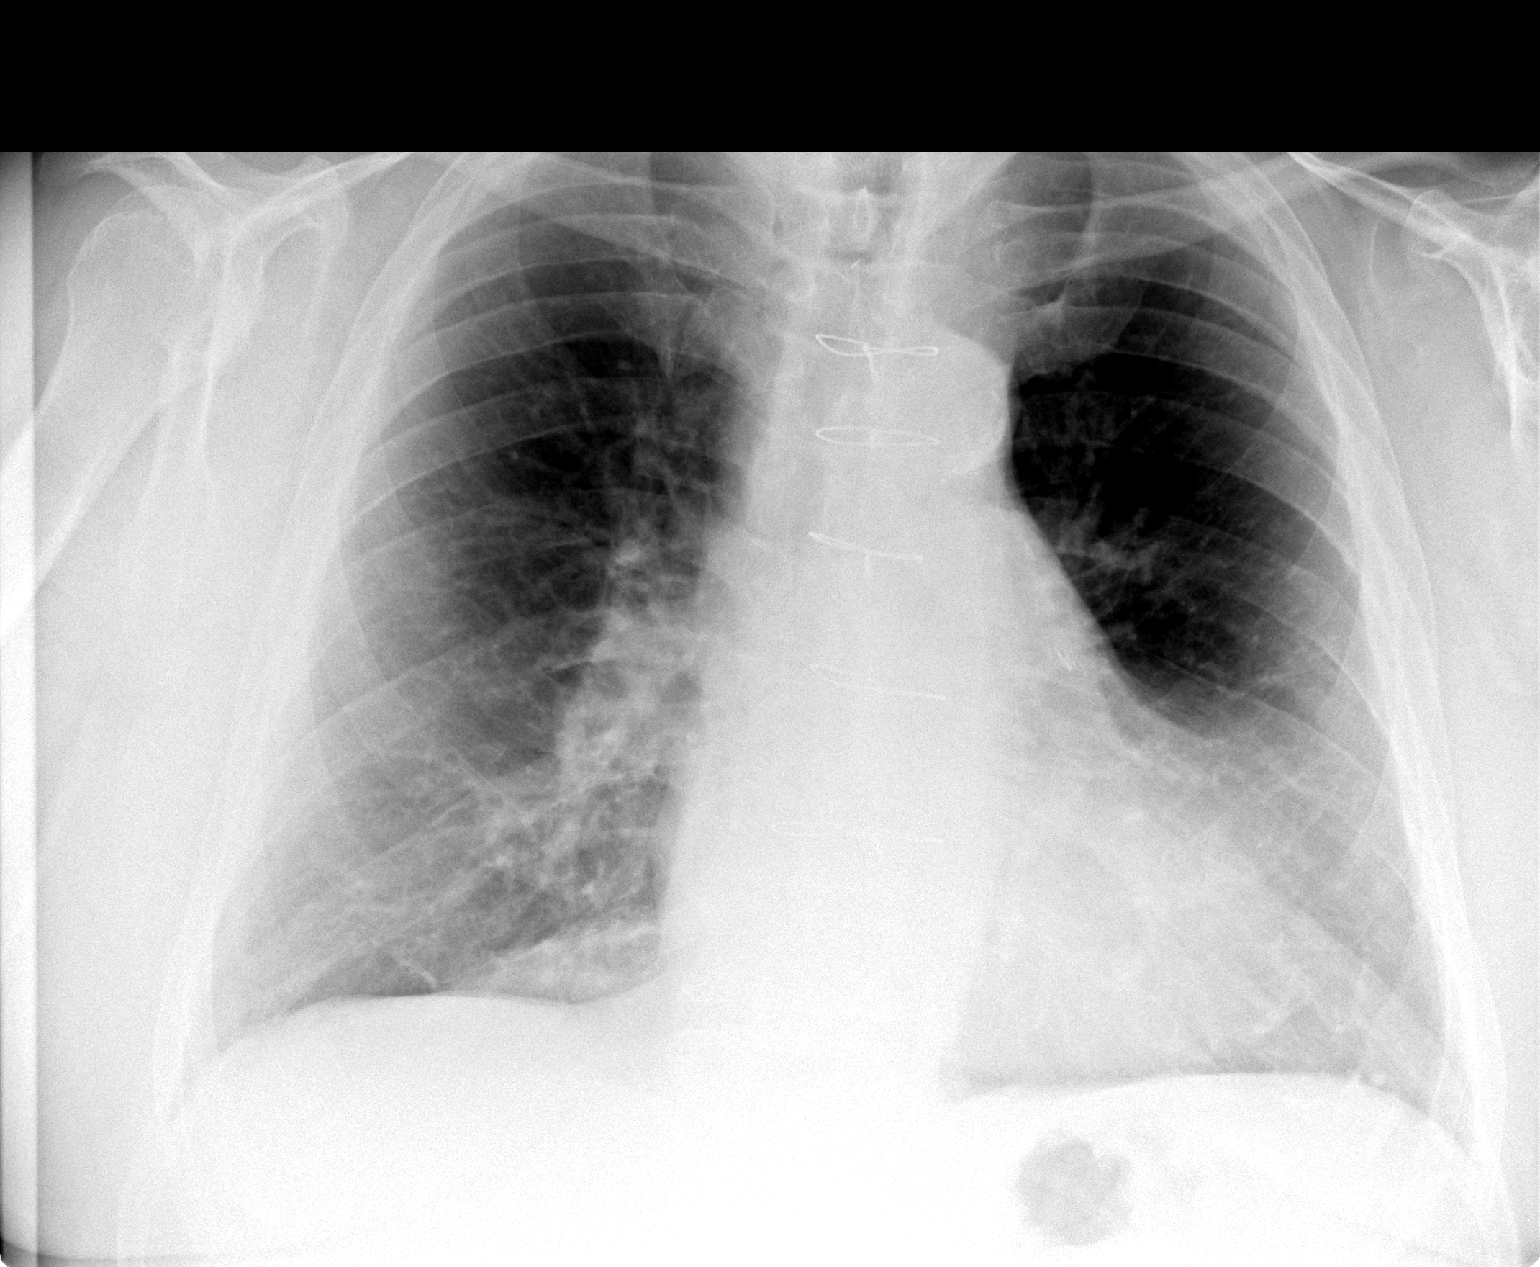

[view not recorded (2 of 2)]
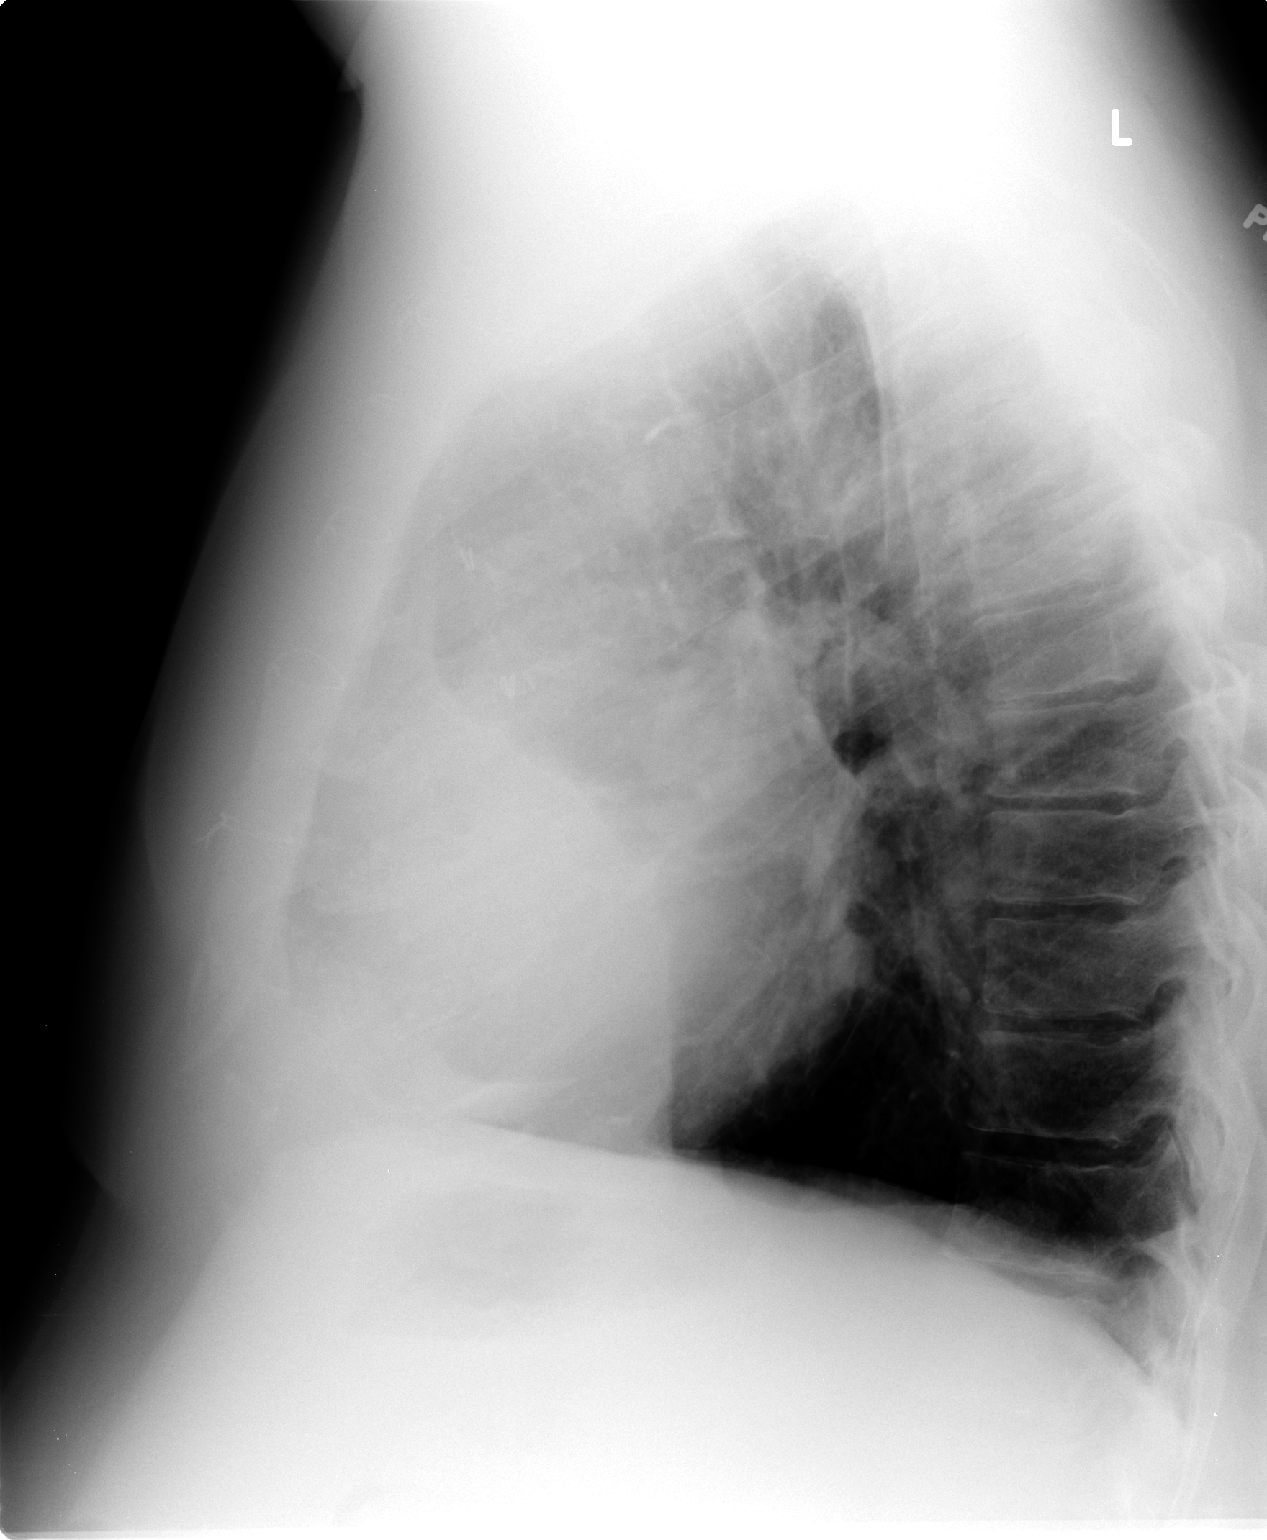

[2 of 2 positions shown; findings below may reference images not displayed]

FINDINGS: Mediastinum hilar structures normal. Lungs are clear of acute
infiltrates. Pleural parenchymal thickening noted consistent with
scarring. Basilar atelectasis. Prior CABG. Cardiomegaly. Normal
pulmonary vascularity. No acute bony abnormality.
IMPRESSION: 1. Prior CABG. Cardiomegaly. No pulmonary venous congestion or
evidence of congestive heart failure.

2. Basilar subsegmental atelectasis atelectasis and/or scarring.

## 2017-01-20 ENCOUNTER — Telehealth: Payer: Self-pay | Admitting: Pulmonary Disease

## 2017-01-20 MED ORDER — UMECLIDINIUM BROMIDE 62.5 MCG/INH IN AEPB: 1.0000 | INHALATION_SPRAY | Freq: Every day | RESPIRATORY_TRACT | 5 refills | Status: DC

## 2017-01-20 NOTE — Telephone Encounter (Signed)
Pt requesting refills for [facility]. Pt aware that [facility] does not have a generic. Rx has been sent to preferred pharmacy. Nothing further needed.

## 2017-01-25 DIAGNOSIS — I482 Chronic atrial fibrillation: Secondary | ICD-10-CM | POA: Diagnosis not present

## 2017-01-25 DIAGNOSIS — Z7901 Long term (current) use of anticoagulants: Secondary | ICD-10-CM | POA: Diagnosis not present

## 2017-02-02 ENCOUNTER — Telehealth: Payer: Self-pay | Admitting: Pulmonary Disease

## 2017-02-02 MED ORDER — UMECLIDINIUM BROMIDE 62.5 MCG/INH IN AEPB: 1.0000 | INHALATION_SPRAY | Freq: Every day | RESPIRATORY_TRACT | 3 refills | Status: DC

## 2017-02-02 MED ORDER — FLUTICASONE-SALMETEROL 250-50 MCG/DOSE IN AEPB: 1.0000 | INHALATION_SPRAY | Freq: Two times a day (BID) | RESPIRATORY_TRACT | 3 refills | Status: DC

## 2017-02-02 NOTE — Telephone Encounter (Signed)
Called spoke with patient and verified the medications he is requesting refills on Patient actually stated that he has applied for patient assistance through [place] and the application requires Rx > 90 days x1 year  Patient aware Rx's will not be signed until tomorrow and is okay with this Will call patient back to make him aware that Rx's are complete Home address verified with patient

## 2017-02-02 NOTE — Telephone Encounter (Signed)
SN signed YY'P Called spoke with patient to make him aware Rx's placed up front in outgoing mail - pt aware this won't be actually mailed out until tomorrow afternoon  Nothing further needed; will sign off

## 2017-02-06 DIAGNOSIS — G4733 Obstructive sleep apnea (adult) (pediatric): Secondary | ICD-10-CM | POA: Diagnosis not present

## 2017-02-15 DIAGNOSIS — E559 Vitamin D deficiency, unspecified: Secondary | ICD-10-CM | POA: Diagnosis not present

## 2017-02-15 DIAGNOSIS — I130 Hypertensive heart and chronic kidney disease with heart failure and stage 1 through stage 4 chronic kidney disease, or unspecified chronic kidney disease: Secondary | ICD-10-CM | POA: Diagnosis not present

## 2017-02-15 DIAGNOSIS — N183 Chronic kidney disease, stage 3 (moderate): Secondary | ICD-10-CM | POA: Diagnosis not present

## 2017-02-22 DIAGNOSIS — I482 Chronic atrial fibrillation: Secondary | ICD-10-CM | POA: Diagnosis not present

## 2017-02-22 DIAGNOSIS — Z7901 Long term (current) use of anticoagulants: Secondary | ICD-10-CM | POA: Diagnosis not present

## 2017-02-23 DIAGNOSIS — M9903 Segmental and somatic dysfunction of lumbar region: Secondary | ICD-10-CM | POA: Diagnosis not present

## 2017-02-23 DIAGNOSIS — M5137 Other intervertebral disc degeneration, lumbosacral region: Secondary | ICD-10-CM | POA: Diagnosis not present

## 2017-02-23 DIAGNOSIS — S338XXA Sprain of other parts of lumbar spine and pelvis, initial encounter: Secondary | ICD-10-CM | POA: Diagnosis not present

## 2017-02-23 DIAGNOSIS — M9904 Segmental and somatic dysfunction of sacral region: Secondary | ICD-10-CM | POA: Diagnosis not present

## 2017-02-23 DIAGNOSIS — M50321 Other cervical disc degeneration at C4-C5 level: Secondary | ICD-10-CM | POA: Diagnosis not present

## 2017-02-23 DIAGNOSIS — M9905 Segmental and somatic dysfunction of pelvic region: Secondary | ICD-10-CM | POA: Diagnosis not present

## 2017-02-23 DIAGNOSIS — S336XXA Sprain of sacroiliac joint, initial encounter: Secondary | ICD-10-CM | POA: Diagnosis not present

## 2017-02-23 DIAGNOSIS — M9901 Segmental and somatic dysfunction of cervical region: Secondary | ICD-10-CM | POA: Diagnosis not present

## 2017-02-25 DIAGNOSIS — I482 Chronic atrial fibrillation: Secondary | ICD-10-CM | POA: Diagnosis not present

## 2017-02-25 DIAGNOSIS — N179 Acute kidney failure, unspecified: Secondary | ICD-10-CM | POA: Diagnosis not present

## 2017-02-25 DIAGNOSIS — I130 Hypertensive heart and chronic kidney disease with heart failure and stage 1 through stage 4 chronic kidney disease, or unspecified chronic kidney disease: Secondary | ICD-10-CM | POA: Diagnosis not present

## 2017-02-25 DIAGNOSIS — R609 Edema, unspecified: Secondary | ICD-10-CM | POA: Diagnosis not present

## 2017-02-25 DIAGNOSIS — L03115 Cellulitis of right lower limb: Secondary | ICD-10-CM | POA: Diagnosis not present

## 2017-02-25 DIAGNOSIS — R110 Nausea: Secondary | ICD-10-CM | POA: Diagnosis not present

## 2017-02-25 DIAGNOSIS — I4891 Unspecified atrial fibrillation: Secondary | ICD-10-CM | POA: Diagnosis not present

## 2017-02-25 DIAGNOSIS — J441 Chronic obstructive pulmonary disease with (acute) exacerbation: Secondary | ICD-10-CM | POA: Diagnosis not present

## 2017-02-25 DIAGNOSIS — I5031 Acute diastolic (congestive) heart failure: Secondary | ICD-10-CM | POA: Diagnosis not present

## 2017-02-25 DIAGNOSIS — I255 Ischemic cardiomyopathy: Secondary | ICD-10-CM | POA: Diagnosis not present

## 2017-02-25 DIAGNOSIS — G4733 Obstructive sleep apnea (adult) (pediatric): Secondary | ICD-10-CM | POA: Diagnosis not present

## 2017-02-25 DIAGNOSIS — E785 Hyperlipidemia, unspecified: Secondary | ICD-10-CM | POA: Diagnosis not present

## 2017-02-25 DIAGNOSIS — A419 Sepsis, unspecified organism: Secondary | ICD-10-CM | POA: Diagnosis not present

## 2017-02-25 DIAGNOSIS — M79604 Pain in right leg: Secondary | ICD-10-CM | POA: Diagnosis not present

## 2017-02-25 DIAGNOSIS — J9601 Acute respiratory failure with hypoxia: Secondary | ICD-10-CM | POA: Diagnosis not present

## 2017-02-25 DIAGNOSIS — I2510 Atherosclerotic heart disease of native coronary artery without angina pectoris: Secondary | ICD-10-CM | POA: Diagnosis not present

## 2017-02-25 DIAGNOSIS — R0602 Shortness of breath: Secondary | ICD-10-CM | POA: Diagnosis not present

## 2017-03-01 DIAGNOSIS — I482 Chronic atrial fibrillation: Secondary | ICD-10-CM | POA: Diagnosis not present

## 2017-03-02 DIAGNOSIS — I10 Essential (primary) hypertension: Secondary | ICD-10-CM | POA: Diagnosis not present

## 2017-03-02 DIAGNOSIS — I872 Venous insufficiency (chronic) (peripheral): Secondary | ICD-10-CM | POA: Diagnosis not present

## 2017-03-02 DIAGNOSIS — I2510 Atherosclerotic heart disease of native coronary artery without angina pectoris: Secondary | ICD-10-CM | POA: Diagnosis not present

## 2017-03-02 DIAGNOSIS — I340 Nonrheumatic mitral (valve) insufficiency: Secondary | ICD-10-CM | POA: Diagnosis not present

## 2017-03-02 DIAGNOSIS — E785 Hyperlipidemia, unspecified: Secondary | ICD-10-CM | POA: Diagnosis not present

## 2017-03-02 DIAGNOSIS — I4892 Unspecified atrial flutter: Secondary | ICD-10-CM | POA: Diagnosis not present

## 2017-03-02 DIAGNOSIS — I4460 Unspecified fascicular block: Secondary | ICD-10-CM | POA: Diagnosis not present

## 2017-03-02 DIAGNOSIS — L03115 Cellulitis of right lower limb: Secondary | ICD-10-CM | POA: Diagnosis not present

## 2017-03-02 DIAGNOSIS — I255 Ischemic cardiomyopathy: Secondary | ICD-10-CM | POA: Diagnosis not present

## 2017-03-09 DIAGNOSIS — G4733 Obstructive sleep apnea (adult) (pediatric): Secondary | ICD-10-CM | POA: Diagnosis not present

## 2017-03-16 DIAGNOSIS — I872 Venous insufficiency (chronic) (peripheral): Secondary | ICD-10-CM | POA: Diagnosis not present

## 2017-03-22 DIAGNOSIS — Z7901 Long term (current) use of anticoagulants: Secondary | ICD-10-CM | POA: Diagnosis not present

## 2017-03-22 DIAGNOSIS — I482 Chronic atrial fibrillation: Secondary | ICD-10-CM | POA: Diagnosis not present

## 2017-04-05 DIAGNOSIS — I482 Chronic atrial fibrillation: Secondary | ICD-10-CM | POA: Diagnosis not present

## 2017-04-08 DIAGNOSIS — G4733 Obstructive sleep apnea (adult) (pediatric): Secondary | ICD-10-CM | POA: Diagnosis not present

## 2017-04-19 DIAGNOSIS — I482 Chronic atrial fibrillation: Secondary | ICD-10-CM | POA: Diagnosis not present

## 2017-04-19 DIAGNOSIS — Z7901 Long term (current) use of anticoagulants: Secondary | ICD-10-CM | POA: Diagnosis not present

## 2017-04-20 DIAGNOSIS — R69 Illness, unspecified: Secondary | ICD-10-CM | POA: Diagnosis not present

## 2017-04-28 DIAGNOSIS — R9431 Abnormal electrocardiogram [ECG] [EKG]: Secondary | ICD-10-CM | POA: Diagnosis not present

## 2017-04-28 DIAGNOSIS — I272 Pulmonary hypertension, unspecified: Secondary | ICD-10-CM | POA: Diagnosis not present

## 2017-04-28 DIAGNOSIS — R0602 Shortness of breath: Secondary | ICD-10-CM | POA: Diagnosis not present

## 2017-04-28 DIAGNOSIS — Z79899 Other long term (current) drug therapy: Secondary | ICD-10-CM | POA: Diagnosis not present

## 2017-04-28 DIAGNOSIS — Z114 Encounter for screening for human immunodeficiency virus [HIV]: Secondary | ICD-10-CM | POA: Diagnosis not present

## 2017-04-28 DIAGNOSIS — R918 Other nonspecific abnormal finding of lung field: Secondary | ICD-10-CM | POA: Diagnosis not present

## 2017-04-28 DIAGNOSIS — I270 Primary pulmonary hypertension: Secondary | ICD-10-CM | POA: Diagnosis not present

## 2017-04-28 DIAGNOSIS — R69 Illness, unspecified: Secondary | ICD-10-CM | POA: Diagnosis not present

## 2017-04-28 DIAGNOSIS — I447 Left bundle-branch block, unspecified: Secondary | ICD-10-CM | POA: Diagnosis not present

## 2017-04-28 DIAGNOSIS — R0609 Other forms of dyspnea: Secondary | ICD-10-CM | POA: Diagnosis not present

## 2017-04-28 DIAGNOSIS — I4891 Unspecified atrial fibrillation: Secondary | ICD-10-CM | POA: Diagnosis not present

## 2017-04-28 DIAGNOSIS — Z87891 Personal history of nicotine dependence: Secondary | ICD-10-CM | POA: Diagnosis not present

## 2017-04-28 LAB — PULMONARY FUNCTION TEST

## 2017-04-29 ENCOUNTER — Ambulatory Visit: Payer: Medicare HMO | Admitting: Pulmonary Disease

## 2017-05-04 DIAGNOSIS — E785 Hyperlipidemia, unspecified: Secondary | ICD-10-CM | POA: Diagnosis not present

## 2017-05-04 DIAGNOSIS — E119 Type 2 diabetes mellitus without complications: Secondary | ICD-10-CM | POA: Diagnosis not present

## 2017-05-04 DIAGNOSIS — I259 Chronic ischemic heart disease, unspecified: Secondary | ICD-10-CM | POA: Diagnosis not present

## 2017-05-04 DIAGNOSIS — I10 Essential (primary) hypertension: Secondary | ICD-10-CM | POA: Diagnosis not present

## 2017-05-04 DIAGNOSIS — E669 Obesity, unspecified: Secondary | ICD-10-CM | POA: Diagnosis not present

## 2017-05-04 DIAGNOSIS — I482 Chronic atrial fibrillation: Secondary | ICD-10-CM | POA: Diagnosis not present

## 2017-05-04 DIAGNOSIS — Z6839 Body mass index (BMI) 39.0-39.9, adult: Secondary | ICD-10-CM | POA: Diagnosis not present

## 2017-05-05 ENCOUNTER — Encounter: Payer: Self-pay | Admitting: Pulmonary Disease

## 2017-05-05 ENCOUNTER — Ambulatory Visit (INDEPENDENT_AMBULATORY_CARE_PROVIDER_SITE_OTHER): Payer: Medicare HMO | Admitting: Pulmonary Disease

## 2017-05-05 VITALS — BP 124/82 | HR 79 | Temp 98.0°F | Ht 71.0 in | Wt 266.1 lb

## 2017-05-05 DIAGNOSIS — Z794 Long term (current) use of insulin: Secondary | ICD-10-CM | POA: Diagnosis not present

## 2017-05-05 DIAGNOSIS — E118 Type 2 diabetes mellitus with unspecified complications: Secondary | ICD-10-CM

## 2017-05-05 DIAGNOSIS — J449 Chronic obstructive pulmonary disease, unspecified: Secondary | ICD-10-CM | POA: Diagnosis not present

## 2017-05-05 DIAGNOSIS — I2510 Atherosclerotic heart disease of native coronary artery without angina pectoris: Secondary | ICD-10-CM

## 2017-05-05 DIAGNOSIS — I10 Essential (primary) hypertension: Secondary | ICD-10-CM | POA: Diagnosis not present

## 2017-05-05 DIAGNOSIS — I5022 Chronic systolic (congestive) heart failure: Secondary | ICD-10-CM | POA: Diagnosis not present

## 2017-05-05 DIAGNOSIS — Z9989 Dependence on other enabling machines and devices: Secondary | ICD-10-CM | POA: Diagnosis not present

## 2017-05-05 DIAGNOSIS — G4733 Obstructive sleep apnea (adult) (pediatric): Secondary | ICD-10-CM | POA: Diagnosis not present

## 2017-05-05 DIAGNOSIS — I480 Paroxysmal atrial fibrillation: Secondary | ICD-10-CM

## 2017-05-05 DIAGNOSIS — Z951 Presence of aortocoronary bypass graft: Secondary | ICD-10-CM

## 2017-05-05 NOTE — Patient Instructions (Signed)
Today we updated your med list in our EPIC system...    Continue your current medications the same...  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    [facility]in at [facility] runs our [facility] [employer] HTN program...    I will follow your progress thru our shared computer system called Epic...  We will hold off on scheduling a follow up appt here until we see your results and progress thru the [facility]...  Call for any questions or if I can be of service in any way.Marland KitchenMarland Kitchen

## 2017-05-05 NOTE — Progress Notes (Signed)
Subjective:     Patient ID: Tommy Riley, male   DOB: March 13, 1946, 71 y.o.   MRN: [id number]  HPI ~  December 14, 2014:  Initial consult w/ SN>   75 y/o WM, from [place], self referred for a pulmonary second opinion>  His local PCP is DrPomposini, his Cardiologist is Secretary/administrator, and his Pulmonologist is DrKBird;  He has multisystem disease w/ OSA, obesity, and COPD; plus HBP, CAD- s/pCABG, AFib, Hyperlipidemia, DM, renal insuffic, Hx stroke, and arthritis...  From the pulmonary standpoint- he has OSA & on CPAP x yrs, doing satis w/ his machine & mask, rests well & denies daytime sleepiness issues;  He is currently on 3 inhalers> Pulmicort-2spBid, Spiriva daily, and Proair rescue inhaler but having to use this 3-4 times daily; he was prev on Symbicort160-2spBid but this was stopped after a Neuro eval by DrAthar who thought his LABA was contributing to the shaking & the tremor has improved off the Formoterol (interesting that it has NOT returned w/ incr use of the Albuterol in Proair);  He is c/o increased SOB & feels it is getting worse, specifically notes DOE w/ min activity & ADLs like showering, dressing, min work about the house; on further questioning it is apparent that his increased DOE has paralleled his increased sedentary lifestyle; he tells me he was Dx w/ CAD & had a 3 vessel CABG in 2001 after which he participated in cardiac rehab & he was apparently doing quite well at that time but developed AFib, had several TIAs, and retired on disability in 2003; since then he has had an increasingly sedentary lifestyle with progressive DOE despite his cardiac & pulmonary meds...       PULM> Macari is an ex-smoker, having started in his teens and smoked for 40+yrs up to 1-2ppd in his adult life, and quit in 2012 at age 74 or so (I est a 80 pack-year history);  He notes SOB as above- plus cough, small amt of thick beige sputum that is hard to expectorate, no hemoptysis & no CP; he related a hx of one prev  bronchoscopy w/ lavage ~10 yrs ago due to "thick phlegm & plugs that had to be sucked out";  He denies hx of asthma in the past but he says he's had pneumonia x3 in the past including double pneumonia when he was in the service in [place]; he says he was first diagnosed w/ COPD about 3 yrs ago... 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 FamHx is neg for any respiratory illnesses... We have requested old records from DrBird including prev CXRs, CT Chest, FullPFTs, ABGs, etc... Current Meds> Pulmicort-2spBid, Spiriva daily, and Proair rescue inhaler but having to use this 3-4 times daily.      CARDS> He has a hx of mult coronary risk factors- HBP, DM, HL; he had ?MI 2001 w/ subseq three vessel CABG; he developed AFib & had several cardioversions w/o success;  Records are avail in [place] from [place] in 2011- he had EP studies and pulmonary vein isolation procedure which was transiently successful but the AFib recurred shortly thereafter (his fatigue & decr exercise tolerance where attributed to the AFib); a cardiac MRI at that time is said to show an EF=45-50% w/ marked LAE & no thrombus, but evid for mult subendocardial infarcts in all  3 coronary art distributions; he didn't tol Tykosyn & was restarted on Amio... We have requested records from [facility] including his most recent 2DEcho (pt says his heart was at 37%) & and cath data.  Current Meds> MetopER50Bid, Cardizem120, Demadex20Qam (extra prn), Aldactone100, ?K20, ASA/ Plavix/ Coumadin...      EXAM showed Afeb, VSS, O2sat=95% on RA; he is obese- 6' Tall, 275#, BMI=37;  HEENT- short/ heavy neck, mallampati3;  Chest- decr BS w/ few scat rhonchi at bases, no wheezing or rales;  Heart- irreg gr1/6 SEM w/o r/g;  Abd- obese, soft, neg;  Ext- TEDs, 1+edema  CXR 12/14/14 showed cardiomegaly,  prior CABG, clear lungs except for some bibasilar scarring, NAD...   Spirometry 12/14/14 showed FVC=2.73 (56%), FEV1=1.63 (43%), %1sec=60, mid-flows are reduced at 24% predicted; this is c/w mod airflow obstruction and GOLD Stage3 COPD...   Ambulatory oxygen saturation test 12/2014> O2sat=95% on RA at rest w/ pulse=97/min;  He walked 2 laps in out office w/ lowest O2sat=93% w/ pulse=111/min & stopped due to SOB & fatigue.  LABS 12/2014:  Chems- ok x BS=141, BUN=46, Cr=1.6;  CBC- wnl w/ Hg=16.8;  BNP=72;  Sed=20;  TSH=1.37       IMP/PLAN>>   We took extra time to explain the multifactorial nature of his dyspnea- pulm/ cardiac/ deconditioning;  From the pulmonary standpoint we decided to change the Pulmicort to ADVAIR500-Bid, continue the [facility] daily, continue the [facility] rescue inhaler but hopefully he will be able to decr it's use;  He can also use MUCINEX 639mQid w/ fluids for the thick phlegm;  From the cardiac perspective we have [place] records [place](esp the most recent 2DEcho to check EF & PA pressure estimates), note that his BNP is norm at 72;  Finally it is imperative that he starts back into a slow gradual exercise program (eg- Silver Sneakers etc) & we discussed this in detail... I have asked him to ret in 4-6wks for recheck & hopefully we will have his old records to review.    ~  January 11, 2015:  154moOV w/ SN>  [name] that he is feeling better on the ADVAIR500Bid, Spiriva daily, & Proair rescue (in fact he hasn't used the PrIAC/InterActiveCorpince his last visit!);  We have not received any records from [place] Cardiology [place] interested in [place] 2[place]cho results)- we will request these again;  He has inquired at his local hosp but they no longer offer pulm rehab program, however he [facility] and we will call to arrange this for him... Unfortunately [name] gained a few lbs but admits to not  taking the diet & exercise prescription seriously enough- he will re-double his efforts in this regard...      EXAM reveals Afeb, VSS, O2sat=92% on RA, wt=288#, 5'11" Tall, BMI=40;  HEENT- short neck, mallampati3;  Chest- decr BS at bases otherw clear;  Heart- irreg, gr1/6 SEM, w/o r/g;  Abd- obese, soft, neg;  Ext- TEDs, 1+edema...       IMP/PLAN>>  MaSkyys improved on the Advair500, Spiriva, Proair (hasn't used this in the last month); OK to wean to the Avdair250Bid... Diet & exercise are critically important here & we reviewed this...  We will refer to pulm rehab via [place] we will try again to get [place].    ~  April 18, 2015:  45m545moV w/  SN>  Arrington returns & reports a good interval- stable on his Advair250, Spiriva, ProventilHFA rescue (using this <1/mo);  His wt is down 5# to 282# today but wife notes still not dieting satis & not exercising regularly;  Recall his hx of HBP, CAD, s/pCABG so diet & exercise are critical for this system as well...      OSA on CPAP> he reports on CPAP for yrs, ?set at 28?, doing satis by his description, we don't have data from his doctors in [place]...      COPD, ex-smoker> on Advair250Bid, spiriva daily, ProventilHFA rescue prn; he quit smoking in 2012; Spirometry 6/16 showed mod airflow obstruction w/ GOLD Stage3 COPD w/ FEV1=1.63 (43%); CXR w/ cardiomeg, prev CABG, clear lungs w/ some scarring at bases.      Dyspnea, Severe deconditioning> he hasn't take the advise to start exercise program so far; we reviewed this again!      HBP, CAD, s/pCABG, Hx stroke, AFib on coumadin> pt reports EF=37%> on ASA81, Plavix75, MetopER50Bid, Cardizem120, Demadex20Bid, Aldactone100, & Coumadin (?triple antiplat/ anticoag therapy?); followed by Cards in [place] we never received their records to review...       HL, DM on insulin, Obesity, RI> on Cres20, Niacin500bid, Lovaza, Levemir insulin (80u daily), Allopurinol100;  he tells me that his last A1c was 6.8 but we do not have records from [place]; Cr here 6/16 was 1.6...  We reviewed prob list, meds, xrays and labs>  IMP/PLAN>>  Bobbie is stable on above therapy; I would love for him to get medical records from his [place] team for [place] to review; we had a frank conversation indicating that further improvement is totally dependent on diet/ exercise/ & wt reduction- he understands...   ~  October 17, 2015:  82moROV w/ SN>  MRuddyreports a good interval w/o new complaints or concerns- he is trying to eat less "no sweets" & in fact is down 13# to 279# today; breathing is unchanged, SOB at baseline & again asked to increase his exercise program;  He notes occas bouts w/ cough, chest congestion, and light gray mucus => we discussed ZPak for prn use...       OSA on CPAP> he reports on CPAP for yrs, ?set at 148, doing satis by his description, we don't have data from [place]..      COPD, ex-smoker> on Advair250Bid, Spiriva daily, ProventilHFA rescue prn; he quit smoking in 2012; Spirometry 6/16 showed mod airflow obstruction w/ GOLD Stage3 COPD w/ FEV1=1.63 (43%); CXR w/ cardiomeg, prev CABG, clear lungs w/ some scarring at bases.      Dyspnea, Severe deconditioning> he hasn't take the advise to start exercise program so far; we reviewed this again!      HBP, CAD, s/pCABG, ischemic cardiomyopathy- EF=30%, Hx stroke, AFib on coumadin> pt reports EF=37%> on ASA81, Plavix75, MetopER50Bid, Cardizem120, Demadex20Bid, Aldactone100, & Coumadin (?triple antiplat/ anticoag therapy?); [place] review...       HL, DM on insulin, Obesity, RI> off Cres20 & on Niacin500bid, Lovaza, Levemir insulin (80u daily), Allopurinol100; he tells me that his last A1c was 6.8 but we [place] Cr here 6/16 was 1.6...  [facility] CKD- stage3 w/ Cr=1.20 and stable...    EXAM reveals Afeb, VSS, O2sat=96% on RA, wt=279#, 5'11" Tall, BMI=38;  HEENT- short neck, mallampati3;  Chest- decr BS at bases otherw  clear;  Heart- irreg, gr1/6 SEM, w/o r/g;  Abd- obese, soft, neg;  Ext- TEDs, tr edema...  IMP/PLAN>>  Delton is stable on his Advair250Bid & Spiriva daily; hasn't needed his Albut rescue inhaler at all; we discussed ZPak for prn use & Rx provided; he will check w/ his PCP regarding vaccination schedule to be sure he is up-to-date; he definitely needs to increase his exercise program & keep up the good work w/ wt reduction... We plan ROV in 90mo sooner if needed for any problems...  ~  April 30, 2016:  659moOV w/ SN>  MaPleaseturns & reports that in June2017 he had a rough boat ride w/ his son, bounced around a lot, & developed severe tailbone pain ever since then;  He was evaluated by his PCP & Ortho w/ XRays and an MRI- no fracture detected;  They tried Rx w/ Pred dosepak, Tramadol, Hydrocodone but NOTHING HELPED;  He is sl tender over tailbone in the crack but not exquisitely so- we discussed this & I mentioned they could consider a nuclear bone scan for further eval, in the meanwhile- try rest/ HEAT/ plus combination of TRAMADOL50 + Extra strength Tylenol TID for the pain... we reviewed the following medical problems during today's office visit >>       OSA on CPAP> on CPAP x yrs [place]?set at 1747 doing satis by his description, we don't have download data avail to review, asked to bring download from his DME on ret...      COPD, ex-smoker> on Advair250Bid, Spiriva daily, ProventilHFA rescue prn; he quit smoking in 2012; Spirometry 6/16 showed mod airflow obstruction w/ GOLD Stage3 COPD w/ FEV1=1.63 (43%); CXR w/ cardiomeg, prev CABG, clear lungs w/ some scarring at bases; he has ZPak for prn use in the winter....      Dyspnea, Severe deconditioning> he had lost wt down to 267# he says before the tailbone injury & hasn't been able to  exercise since then; we reviewed the needed DIET/EXERCISE/Wt reduction strategies...      Cardiovasc issues>  HBP, CAD, s/pCABG, ischemic cardiomyopathy- EF=30%, Hx stroke, AFib on coumadin> pt reports EF=37%> on ASA81, Plavix75, MetopER50Bid, Cardizem120, Demadex20Bid, Aldactone100, & Coumadin (?triple antiplat/ anticoag therapy?); [place] to review...       Medical issues>  HL, DM on insulin, Obesity, RI> off Crestor & Niacin, on Lovaza, Levemir insulin (80u daily), Allopurinol100; he tells me that his last A1c was 6.8 & Cr here 6/16 was 1.6; we received note from Nephrology-DrBaveja 08/16/15-- CKD, stage3, w/ Cr 1.5-1.8 over the last 2y26yrut improved to 1.2... Marland KitchenMarland KitchenXAM reveals Afeb, VSS, O2sat=96% on RA, wt=281#, 5'11" Tall, BMI=38;  HEENT- short neck, mallampati3;  Chest- decr BS at bases otherw clear;  Heart- irreg, gr1/6 SEM, w/o r/g;  Abd- obese, soft, neg;  Ext- TEDs, tr edema...  IMP/PLAN>>  MauBraylns had a set back w/ the tailbone pain- perhaps Ortho could consider a nuclear bone scan if the pain persists; advised REST, apply HEAT, try combination of TRAMADOL50 + extra strength Tylenol TID; from the pulmonary standpoint he needs to get more active, exercise program discussed, and continue the ADVAIR/ [facility] w/ ProairHFA rescue inhaler as needed;  We gave him the 2017 FLU vaccine today & rec that he review w/ his PCP regarding his needed pneumonia vaccinations...   ADDENDUM>>  MauDiontaeought page 2 of a 2page note from ?an  occupational medicine eval? done 06/16/16 by DrDTChristenberry indicating probable asbestosis.Marland Kitchen.  ~  October 29, 2016:  48moROV & Adir [facility] ResMed S[facility] [place] he is very pleased w/ this equipment;  We got a download from [employer] excellent compliance, 8H per night, pressure=17cmH2O, no signif leak & AHI on CPAP = 1;  He notes that his breathing is the same- min  cough, small amt thick sput & hard to expectorate, persistent SOB & notes that he really can't do anything due to his back & coccyx pain which he notes has "kicked my butt- literally", also has bulging disc on MRI & right leg shorter than left; he has been under a Chiro care program using a decompression table & improved some "last XRay showed no bulge";  I indicated that hopefully he would be able to return to activity/ exercise to improve his severe deconditioning...     He remains on Advair250Bid, Incruse daily (Spiriva no longer covered by his insurance), ProventilHFA which he averages 1-2 inhalations per day...    From the Cardiac perspective he remains stable on his coumadin for AFib, and ASA81, ToprolXL, CardizemCD, Demadex/ Aldactone/ KCl; we have never received any records from [place] to review...    Medical issues include HL, DM on insulin, Obesity, & renal insuffic> PCP= DrPomposini & he has seen Nephrology as well, no records or labs to review... EXAM reveals Afeb, VSS, O2sat=93% on RA, wt=286#, 5'11" Tall, BMI=38-9;  HEENT- short neck, mallampati3;  Chest- decr BS at bases otherw clear;  Heart- irreg, gr1/6 SEM, w/o r/g;  Abd- obese, soft, neg;  Ext- TEDs, tr edema...  IMP/PLAN>>  As prev mentioned he is asked to get on diet, incr exercise & get weight down;  Continue Advair/ Incruse regularly, start the MUCINEX 6020mqid w/ fliuds, and use the albutHFA rescue inhaler as needed... He knows to call regarding any breathing issues & we plan rov recheck in 66m2685mo   ~  May 05, 2017:  685mo32mo & MaurDonshayurns w/ many changes over the past 685mon77monthhe pt indicates that his cardiologist DrZakhary did another 2DEcho & [facility];  I was able to review the [facility] in Care Rivesuding DrFortin's comprehensive consultation dated 04/28/17-- their work up has included extensive review of avail old data, LAB testing, 6MW  test w/o desaturation on RA, PFT w/ severe airflow obstruction w/ air trapping and DLCO=67%; V/Q is low prob suggestive of COPD; 2DEcho recheck confirmed difficult images- EF~50-55%, RV size & function looked ok, LA enlarged and PA dilated-- they are planning at least a RHC sEden...  I reviewed all this w/ the pt & his wife, all questions answered to the best of my ability, I indicated to him that I would keep up w/ his progress thru the computer records...     The pt also indicates to me that he was [facility] [place], treated w/ several antibiotics and improved;  He was referred to a Vein Clinic & he indicates that he is scheduled to have "corrective action" take place soon-- I indicated to the pt that in my opinion this work is of lesser importance than the [facility] PulmHTN eval & he should wait before proceeding w/ any vein procedures...     He also indicates to me that he saw his PCP regarding chest congestion recently & is  currently finishing Rx w/ Augmentin & Tussionex-- we do no have any records from [place] this includes an official med list from his doctors. EXAM reveals Afeb, VSS, O2sat=93% on RA, wt is down 20# to 266#, 5'11" Tall, BMI=37;  HEENT- short neck, mallampati3;  Chest- decr BS at bases otherw clear;  Heart- irreg, gr1/6 SEM, w/o r/g;  Abd- obese, soft, neg;  Ext- TEDs, tr edema...  IMP/PLAN>>  Skylur was referred to [facility]in for [facility] eval which is on-going at present;  He will maintain all current therapies for his Heart/ CHF, Lungs/ COPD, and OSA on CPAP;  Similarly he will continue to work on weight reduction while the [facility] team evaluates for the presence of [place]...    Past Medical History  Diagnosis Date  . Heart disease  >> on MetoprololER, Cardizem, Demadex, Aldactone, ?KCl, off prev Lotensin     Atherosclerotic  . Atrial fibrillation >> on ASA, , Coumadin (?triple antiplat/anticoag therapy?- Cards in [place])   . Hypertension   . Diabetes mellitus  without complication >> on Levemir insulin   . Atrial fibrillation   . Stroke 2003  . Kidney disease     stage 3  . High cholesterol >> on Crestor, Lovaza, Niacin   . Depression   . COPD (chronic obstructive pulmonary disease)   . OSA (obstructive sleep apnea)     Past Surgical History:  Procedure Laterality Date  . CORONARY ARTERY BYPASS GRAFT    . heart ablation  2011  . triple by pass  09/16/1999    Outpatient Encounter Prescriptions as of 05/05/2017  Medication Sig  . ACCU-CHEK AVIVA PLUS test strip   . albuterol (PROVENTIL HFA;VENTOLIN HFA) 108 (90 Base) MCG/ACT inhaler Inhale 2 puffs into the lungs every 4 (four) hours as needed for wheezing or shortness of breath. Inhale one to two puffs by mouth as needed  . allopurinol (ZYLOPRIM) 100 MG tablet Take 2 tablets in the morning and 1 tablet in the evening  . aspirin 81 MG tablet Take 81 mg by mouth daily.  Marland Kitchen b complex vitamins tablet Take 1 tablet by mouth daily.  . Blood Glucose Monitoring Suppl (ONE TOUCH ULTRA 2) W/DEVICE KIT by Does not apply route.  . Cholecalciferol (VITAMIN D3) 2000 UNITS TABS Take by mouth daily.  . clopidogrel (PLAVIX) 75 MG tablet Take 75 mg by mouth daily.  Marland Kitchen diltiazem (CARDIZEM) 120 MG tablet Take 120 mg by mouth daily.  . Fluticasone-Salmeterol (ADVAIR) 250-50 MCG/DOSE AEPB Inhale 1 puff into the lungs every 12 (twelve) hours.  . folic acid (FOLVITE) 222 MCG tablet Take 400 mcg by mouth daily.  Marland Kitchen guaiFENesin (MUCINEX) 600 MG 12 hr tablet Take 600 mg by mouth 4 (four) times daily.  . insulin detemir (LEVEMIR) 100 UNIT/ML injection Inject 80 Units into the skin daily.  . Melatonin 5 MG TABS Take by mouth daily.  . metoprolol succinate (TOPROL-XL) 50 MG 24 hr tablet Take 50 mg by mouth 2 (two) times daily. Take 1 tablet 2 times daily  . Multiple Vitamins-Minerals (CENTRUM SILVER PO) Take 1 tablet by mouth daily.   Marland Kitchen NIACIN PO Take 500 mg by mouth 2 (two) times daily. Take 500 mg two times daily  .  omega-3 acid ethyl esters (LOVAZA) 1 G capsule Take by mouth 2 (two) times daily.  . Potassium Chloride (KLOR-CON 10 PO) Take by mouth daily. Extended release  . Probiotic Product (PROBIOTIC-10 ULTIMATE) CAPS Take 1 capsule by mouth daily.  . rosuvastatin (  CRESTOR) 20 MG tablet Take 20 mg by mouth daily.  Marland Kitchen spironolactone (ALDACTONE) 100 MG tablet Take 100 mg by mouth daily.  . temazepam (RESTORIL) 15 MG capsule Take 15 mg by mouth at bedtime as needed for sleep.  Marland Kitchen torsemide (DEMADEX) 20 MG tablet Take 20 mg by mouth 2 (two) times daily.  . traMADol (ULTRAM) 50 MG tablet Take 1 tablet by mouth three times daily with tylenol as directed  . umeclidinium bromide (INCRUSE ELLIPTA) 62.5 MCG/INH AEPB Inhale 1 puff into the lungs daily.  Marland Kitchen warfarin (COUMADIN) 3 MG tablet Take 3 mg by mouth daily. And take 1 mg on Monday, Wednesday and Friday   No facility-administered encounter medications on file as of 05/05/2017.     Allergies  Allergen Reactions  . Codeine Anaphylaxis    Tightness in chest    Immunization History  Administered Date(s) Administered  . Influenza, High Dose Seasonal PF 04/30/2016, 04/21/2017  . Influenza,inj,Quad PF,6+ Mos 05/08/2015  . Influenza-Unspecified 04/14/2014  . Pneumococcal Conjugate-13 04/14/2014  . Tdap 03/15/2017  . Zoster Recombinat (Shingrix) 03/15/2017, 03/29/2017    Current Medications, Allergies, Past Medical History, Past Surgical History, Family History, and Social History were reviewed in [employer] record.   Review of Systems            All symptoms NEG except where BOLDED >>  Constitutional:  F/C/S, fatigue, anorexia, unexpected weight change. HEENT:  HA, visual changes, hearing loss, earache, nasal symptoms, sore throat, mouth sores, hoarseness. Resp:  cough, sputum, hemoptysis; SOB, tightness, wheezing. Cardio:  CP, palpit, DOE, orthopnea, edema. GI:  N/V/D/C, blood in stool; reflux, abd pain, distention, gas. GU:   dysuria, freq, urgency, hematuria, flank pain, voiding difficulty. MS:  joint pain, swelling, tenderness, decr ROM; neck pain, back pain, etc. Neuro:  HA, tremors, seizures, dizziness, syncope, weakness, numbness, gait abn. Skin:  suspicious lesions or skin rash. Heme:  adenopathy, bruising, bleeding. Psyche:  confusion, agitation, sleep disturbance, hallucinations, anxiety, depression suicidal.   Objective:   Physical Exam      Vital Signs:  Reviewed...   General:  WD, obese, 70 y/o WM in NAD; alert & oriented; pleasant & cooperative... HEENT:  [place]/AT; Conjunctiva- pink, Sclera- nonicteric, EOM-wnl, PERRLA, EACs-clear, TMs-wnl; NOSE-clear; THROAT-clear & wnl.  Neck:  Supple w/ decr ROM; no JVD; normal carotid impulses w/o bruits; no thyromegaly or nodules palpated; no lymphadenopathy.  Chest:  decr BS at bases but no wheezing, rales, rhonchi, or signs of consolidation... Heart:  Irreg, AFib, Gr1/6 SEM w/o rubs or gallops detected... Abdomen:  Soft & nontender- no guarding or rebound; normal bowel sounds; no organomegaly or masses palpated. Ext:  decrROM; +arthritic changes; no varicose veins, +venous insuffic, 1+ edema;  Pulses intact w/o bruits. Neuro:  No focal neuro deficits,gait normal & balance OK. Derm:  No lesions noted; no rash etc. Lymph:  No cervical, supraclavicular, axillary, or inguinal adenopathy palpated.   Assessment:      IMP >>       OSA on CPAP> on CPAP x yrs per his physicians in [place] => continue same & we encouraged compliance...      COPD, ex-smoker> on Advair250Bid, Spiriva daily, ProventilHFA rescue prn; he quit smoking in 2012; Spirometry 6/16 showed mod airflow obstruction w/ GOLD Stage3 COPD w/ FEV1=1.63 (43%); CXR w/ cardiomeg, prev CABG, clear lungs w/ some scarring at bases; he has ZPak for prn use in the winter....      Dyspnea, Severe deconditioning> he had lost wt  down to 267# he says before the tailbone injury & hasn't been able to exercise since  then; we reviewed the needed DIET/EXERCISE/Wt reduction strategies...      Cardiovasc issues>  HBP, CAD, s/pCABG, ischemic cardiomyopathy- EF=30%, Hx stroke, AFib on coumadin> pt reports EF=37%> on ASA81, Plavix75, MetopER50Bid, Cardizem120, Demadex20Bid, Aldactone100, & Coumadin (?triple antiplat/ anticoag therapy?); followed by Cards in [place] we never received their records to review...       Medical issues>  HL, DM on insulin, Obesity, RI, tailbone injury 12/2015> off Crestor & Niacin, on Lovaza, Levemir insulin (80u daily), Allopurinol100; he tells me that his last A1c was 6.8 & Cr here 6/16 was 1.6; we received note from Nephrology-DrBaveja 08/16/15-- CKD, stage3, w/ Cr 1.5-1.8 over the last 19yr but improved to 1.2...   PLAN >>   04/18/15>   MRoaldis stable on above therapy; I would love for him to get medical records from his [employer] for [place] review; we had a frank conversation indicating that further improvement is totally dependent on diet/ exercise/ & wt reduction- he understands; we plan ROV in [place] if needed prn...  10/17/15>   MaDwaines stable on his Advair250Bid & Spiriva daily; hasn't needed his Albut rescue inhaler at all; we discussed ZPak for prn use & Rx provided; he will check w/ his PCP regarding vaccination schedule to be sure he is up-to-date; he definitely needs to increase his exercise program 7 keep up the good work w/ wt reduction... 04/30/16>   MaJocelynas had a set back w/ the tailbone pain- perhaps Ortho could consider a nuclear bone scan if the pain persists; advised REST, apply HEAT, try combination of TRAMADOL50 + extra strength Tylenol TID; from the pulmonary standpoint he needs to get more active, exercise program discussed, and continue the [facility] w/ ProairHFA rescue inhaler as needed;  We gave him the 2017 FLU vaccine today & rec that he review w/ his PCP regarding his needed pneumonia vaccinations 10/29/16>   As prev mentioned he is  asked to get on diet, incr exercise & get weight down;  Continue Advair/ Incruse regularly, start the MUCINEX 60047mid w/ fliuds, and use the albutHFA rescue inhaler as needed... He knows to call regarding any breathing issues & we plan rov recheck in 12mo25mo/31/18>  A lot has occurred in the interim-- [place], and referred to Vein clinic;  2DEcho is said to have revealed PH--[facility] & eval is in progress...      Plan:     Patient's Medications  New Prescriptions   No medications on file  Previous Medications   ACCU-CHEK AVIVA PLUS TEST STRIP       ALBUTEROL (PROVENTIL HFA;VENTOLIN HFA) 108 (90 BASE) MCG/ACT INHALER    Inhale 2 puffs into the lungs every 4 (four) hours as needed for wheezing or shortness of breath. Inhale one to two puffs by mouth as needed   ALLOPURINOL (ZYLOPRIM) 100 MG TABLET    Take 2 tablets in the morning and 1 tablet in the evening   ASPIRIN 81 MG TABLET    Take 81 mg by mouth daily.   B COMPLEX VITAMINS TABLET    Take 1 tablet by mouth daily.   BLOOD GLUCOSE MONITORING SUPPL (ONE TOUCH ULTRA 2) W/DEVICE KIT    by Does not apply route.   CHOLECALCIFEROL (VITAMIN D3) 2000 UNITS TABS    Take by mouth daily.  CLOPIDOGREL (PLAVIX) 75 MG TABLET    Take 75 mg by mouth daily.   DILTIAZEM (CARDIZEM) 120 MG TABLET    Take 120 mg by mouth daily.   FLUTICASONE-SALMETEROL (ADVAIR) 250-50 MCG/DOSE AEPB    Inhale 1 puff into the lungs every 12 (twelve) hours.   FOLIC ACID (FOLVITE) 720 MCG TABLET    Take 400 mcg by mouth daily.   GUAIFENESIN (MUCINEX) 600 MG 12 HR TABLET    Take 600 mg by mouth 4 (four) times daily.   INSULIN DETEMIR (LEVEMIR) 100 UNIT/ML INJECTION    Inject 80 Units into the skin daily.   MELATONIN 5 MG TABS    Take by mouth daily.   METOPROLOL SUCCINATE (TOPROL-XL) 50 MG 24 HR TABLET    Take 50 mg by mouth 2 (two) times daily. Take 1 tablet 2 times daily   MULTIPLE VITAMINS-MINERALS (CENTRUM SILVER PO)     Take 1 tablet by mouth daily.    NIACIN PO    Take 500 mg by mouth 2 (two) times daily. Take 500 mg two times daily   OMEGA-3 ACID ETHYL ESTERS (LOVAZA) 1 G CAPSULE    Take by mouth 2 (two) times daily.   POTASSIUM CHLORIDE (KLOR-CON 10 PO)    Take by mouth daily. Extended release   PROBIOTIC PRODUCT (PROBIOTIC-10 ULTIMATE) CAPS    Take 1 capsule by mouth daily.   ROSUVASTATIN (CRESTOR) 20 MG TABLET    Take 20 mg by mouth daily.   SPIRONOLACTONE (ALDACTONE) 100 MG TABLET    Take 100 mg by mouth daily.   TEMAZEPAM (RESTORIL) 15 MG CAPSULE    Take 15 mg by mouth at bedtime as needed for sleep.   TORSEMIDE (DEMADEX) 20 MG TABLET    Take 20 mg by mouth 2 (two) times daily.   TRAMADOL (ULTRAM) 50 MG TABLET    Take 1 tablet by mouth three times daily with tylenol as directed   UMECLIDINIUM BROMIDE (INCRUSE ELLIPTA) 62.5 MCG/INH AEPB    Inhale 1 puff into the lungs daily.   WARFARIN (COUMADIN) 3 MG TABLET    Take 3 mg by mouth daily. And take 1 mg on Monday, Wednesday and Friday  Modified Medications   No medications on file  Discontinued Medications   No medications on file

## 2017-05-09 DIAGNOSIS — G4733 Obstructive sleep apnea (adult) (pediatric): Secondary | ICD-10-CM | POA: Diagnosis not present

## 2017-05-10 DIAGNOSIS — I482 Chronic atrial fibrillation: Secondary | ICD-10-CM | POA: Diagnosis not present

## 2017-05-17 DIAGNOSIS — Z7901 Long term (current) use of anticoagulants: Secondary | ICD-10-CM | POA: Diagnosis not present

## 2017-05-17 DIAGNOSIS — I482 Chronic atrial fibrillation: Secondary | ICD-10-CM | POA: Diagnosis not present

## 2017-05-24 DIAGNOSIS — Z794 Long term (current) use of insulin: Secondary | ICD-10-CM | POA: Diagnosis not present

## 2017-05-24 DIAGNOSIS — I509 Heart failure, unspecified: Secondary | ICD-10-CM | POA: Diagnosis not present

## 2017-05-24 DIAGNOSIS — Z7901 Long term (current) use of anticoagulants: Secondary | ICD-10-CM | POA: Diagnosis not present

## 2017-05-24 DIAGNOSIS — S8011XA Contusion of right lower leg, initial encounter: Secondary | ICD-10-CM | POA: Diagnosis not present

## 2017-05-24 DIAGNOSIS — M7981 Nontraumatic hematoma of soft tissue: Secondary | ICD-10-CM | POA: Diagnosis not present

## 2017-05-24 DIAGNOSIS — Z7982 Long term (current) use of aspirin: Secondary | ICD-10-CM | POA: Diagnosis not present

## 2017-05-24 DIAGNOSIS — I252 Old myocardial infarction: Secondary | ICD-10-CM | POA: Diagnosis not present

## 2017-05-24 DIAGNOSIS — E119 Type 2 diabetes mellitus without complications: Secondary | ICD-10-CM | POA: Diagnosis not present

## 2017-05-24 DIAGNOSIS — Z886 Allergy status to analgesic agent status: Secondary | ICD-10-CM | POA: Diagnosis not present

## 2017-05-24 DIAGNOSIS — Z951 Presence of aortocoronary bypass graft: Secondary | ICD-10-CM | POA: Diagnosis not present

## 2017-05-24 DIAGNOSIS — J449 Chronic obstructive pulmonary disease, unspecified: Secondary | ICD-10-CM | POA: Diagnosis not present

## 2017-05-24 DIAGNOSIS — W228XXA Striking against or struck by other objects, initial encounter: Secondary | ICD-10-CM | POA: Diagnosis not present

## 2017-05-25 DIAGNOSIS — Z794 Long term (current) use of insulin: Secondary | ICD-10-CM | POA: Diagnosis not present

## 2017-05-25 DIAGNOSIS — Z951 Presence of aortocoronary bypass graft: Secondary | ICD-10-CM | POA: Diagnosis not present

## 2017-05-25 DIAGNOSIS — J449 Chronic obstructive pulmonary disease, unspecified: Secondary | ICD-10-CM | POA: Diagnosis not present

## 2017-05-25 DIAGNOSIS — E119 Type 2 diabetes mellitus without complications: Secondary | ICD-10-CM | POA: Diagnosis not present

## 2017-05-25 DIAGNOSIS — Z886 Allergy status to analgesic agent status: Secondary | ICD-10-CM | POA: Diagnosis not present

## 2017-05-25 DIAGNOSIS — I252 Old myocardial infarction: Secondary | ICD-10-CM | POA: Diagnosis not present

## 2017-05-25 DIAGNOSIS — M7981 Nontraumatic hematoma of soft tissue: Secondary | ICD-10-CM | POA: Diagnosis not present

## 2017-05-25 DIAGNOSIS — I509 Heart failure, unspecified: Secondary | ICD-10-CM | POA: Diagnosis not present

## 2017-05-25 DIAGNOSIS — Z7901 Long term (current) use of anticoagulants: Secondary | ICD-10-CM | POA: Diagnosis not present

## 2017-05-26 DIAGNOSIS — N183 Chronic kidney disease, stage 3 (moderate): Secondary | ICD-10-CM | POA: Diagnosis not present

## 2017-05-26 DIAGNOSIS — Z743 Need for continuous supervision: Secondary | ICD-10-CM | POA: Diagnosis not present

## 2017-05-26 DIAGNOSIS — Z886 Allergy status to analgesic agent status: Secondary | ICD-10-CM | POA: Diagnosis not present

## 2017-05-26 DIAGNOSIS — D62 Acute posthemorrhagic anemia: Secondary | ICD-10-CM | POA: Diagnosis not present

## 2017-05-26 DIAGNOSIS — Z951 Presence of aortocoronary bypass graft: Secondary | ICD-10-CM | POA: Diagnosis not present

## 2017-05-26 DIAGNOSIS — J449 Chronic obstructive pulmonary disease, unspecified: Secondary | ICD-10-CM | POA: Diagnosis not present

## 2017-05-26 DIAGNOSIS — W1842XS Slipping, tripping and stumbling without falling due to stepping into hole or opening, sequela: Secondary | ICD-10-CM | POA: Diagnosis not present

## 2017-05-26 DIAGNOSIS — Z8673 Personal history of transient ischemic attack (TIA), and cerebral infarction without residual deficits: Secondary | ICD-10-CM | POA: Diagnosis not present

## 2017-05-26 DIAGNOSIS — I10 Essential (primary) hypertension: Secondary | ICD-10-CM | POA: Diagnosis not present

## 2017-05-26 DIAGNOSIS — I482 Chronic atrial fibrillation: Secondary | ICD-10-CM | POA: Diagnosis not present

## 2017-05-26 DIAGNOSIS — N189 Chronic kidney disease, unspecified: Secondary | ICD-10-CM | POA: Diagnosis not present

## 2017-05-26 DIAGNOSIS — R279 Unspecified lack of coordination: Secondary | ICD-10-CM | POA: Diagnosis not present

## 2017-05-26 DIAGNOSIS — M7981 Nontraumatic hematoma of soft tissue: Secondary | ICD-10-CM | POA: Diagnosis not present

## 2017-05-26 DIAGNOSIS — L03115 Cellulitis of right lower limb: Secondary | ICD-10-CM | POA: Diagnosis not present

## 2017-05-26 DIAGNOSIS — E119 Type 2 diabetes mellitus without complications: Secondary | ICD-10-CM | POA: Diagnosis not present

## 2017-05-26 DIAGNOSIS — I270 Primary pulmonary hypertension: Secondary | ICD-10-CM | POA: Diagnosis not present

## 2017-05-26 DIAGNOSIS — G4733 Obstructive sleep apnea (adult) (pediatric): Secondary | ICD-10-CM | POA: Diagnosis not present

## 2017-05-26 DIAGNOSIS — I252 Old myocardial infarction: Secondary | ICD-10-CM | POA: Diagnosis not present

## 2017-05-26 DIAGNOSIS — I5022 Chronic systolic (congestive) heart failure: Secondary | ICD-10-CM | POA: Diagnosis not present

## 2017-05-26 DIAGNOSIS — I5030 Unspecified diastolic (congestive) heart failure: Secondary | ICD-10-CM | POA: Diagnosis not present

## 2017-05-26 DIAGNOSIS — Z794 Long term (current) use of insulin: Secondary | ICD-10-CM | POA: Diagnosis not present

## 2017-05-26 DIAGNOSIS — I872 Venous insufficiency (chronic) (peripheral): Secondary | ICD-10-CM | POA: Diagnosis not present

## 2017-05-26 DIAGNOSIS — C4492 Squamous cell carcinoma of skin, unspecified: Secondary | ICD-10-CM | POA: Diagnosis not present

## 2017-05-26 DIAGNOSIS — I340 Nonrheumatic mitral (valve) insufficiency: Secondary | ICD-10-CM | POA: Diagnosis not present

## 2017-05-26 DIAGNOSIS — T148XXA Other injury of unspecified body region, initial encounter: Secondary | ICD-10-CM | POA: Diagnosis not present

## 2017-05-26 DIAGNOSIS — D631 Anemia in chronic kidney disease: Secondary | ICD-10-CM | POA: Diagnosis not present

## 2017-05-26 DIAGNOSIS — D638 Anemia in other chronic diseases classified elsewhere: Secondary | ICD-10-CM | POA: Diagnosis not present

## 2017-05-26 DIAGNOSIS — S8012XA Contusion of left lower leg, initial encounter: Secondary | ICD-10-CM | POA: Diagnosis not present

## 2017-05-26 DIAGNOSIS — I509 Heart failure, unspecified: Secondary | ICD-10-CM | POA: Diagnosis not present

## 2017-05-26 DIAGNOSIS — I2510 Atherosclerotic heart disease of native coronary artery without angina pectoris: Secondary | ICD-10-CM | POA: Diagnosis not present

## 2017-05-26 DIAGNOSIS — I272 Pulmonary hypertension, unspecified: Secondary | ICD-10-CM | POA: Diagnosis not present

## 2017-05-26 DIAGNOSIS — Z7901 Long term (current) use of anticoagulants: Secondary | ICD-10-CM | POA: Diagnosis not present

## 2017-05-26 DIAGNOSIS — E785 Hyperlipidemia, unspecified: Secondary | ICD-10-CM | POA: Diagnosis not present

## 2017-05-26 DIAGNOSIS — I4460 Unspecified fascicular block: Secondary | ICD-10-CM | POA: Diagnosis not present

## 2017-05-26 DIAGNOSIS — I4891 Unspecified atrial fibrillation: Secondary | ICD-10-CM | POA: Diagnosis not present

## 2017-05-26 DIAGNOSIS — S8011XA Contusion of right lower leg, initial encounter: Secondary | ICD-10-CM | POA: Diagnosis not present

## 2017-05-26 DIAGNOSIS — E1121 Type 2 diabetes mellitus with diabetic nephropathy: Secondary | ICD-10-CM | POA: Diagnosis not present

## 2017-05-26 DIAGNOSIS — I255 Ischemic cardiomyopathy: Secondary | ICD-10-CM | POA: Diagnosis not present

## 2017-05-26 DIAGNOSIS — I4892 Unspecified atrial flutter: Secondary | ICD-10-CM | POA: Diagnosis not present

## 2017-06-05 DIAGNOSIS — D62 Acute posthemorrhagic anemia: Secondary | ICD-10-CM | POA: Diagnosis not present

## 2017-06-05 DIAGNOSIS — E119 Type 2 diabetes mellitus without complications: Secondary | ICD-10-CM | POA: Diagnosis not present

## 2017-06-05 DIAGNOSIS — J449 Chronic obstructive pulmonary disease, unspecified: Secondary | ICD-10-CM | POA: Diagnosis not present

## 2017-06-05 DIAGNOSIS — D631 Anemia in chronic kidney disease: Secondary | ICD-10-CM | POA: Diagnosis not present

## 2017-06-05 DIAGNOSIS — Z743 Need for continuous supervision: Secondary | ICD-10-CM | POA: Diagnosis not present

## 2017-06-05 DIAGNOSIS — R279 Unspecified lack of coordination: Secondary | ICD-10-CM | POA: Diagnosis not present

## 2017-06-05 DIAGNOSIS — S8012XA Contusion of left lower leg, initial encounter: Secondary | ICD-10-CM | POA: Diagnosis not present

## 2017-06-05 DIAGNOSIS — I5030 Unspecified diastolic (congestive) heart failure: Secondary | ICD-10-CM | POA: Diagnosis not present

## 2017-06-05 DIAGNOSIS — I5022 Chronic systolic (congestive) heart failure: Secondary | ICD-10-CM | POA: Diagnosis not present

## 2017-06-05 DIAGNOSIS — I2510 Atherosclerotic heart disease of native coronary artery without angina pectoris: Secondary | ICD-10-CM | POA: Diagnosis not present

## 2017-06-05 DIAGNOSIS — G4733 Obstructive sleep apnea (adult) (pediatric): Secondary | ICD-10-CM | POA: Diagnosis not present

## 2017-06-05 DIAGNOSIS — I270 Primary pulmonary hypertension: Secondary | ICD-10-CM | POA: Diagnosis not present

## 2017-06-05 DIAGNOSIS — S8011XA Contusion of right lower leg, initial encounter: Secondary | ICD-10-CM | POA: Diagnosis not present

## 2017-06-05 DIAGNOSIS — I482 Chronic atrial fibrillation: Secondary | ICD-10-CM | POA: Diagnosis not present

## 2017-06-05 DIAGNOSIS — C4492 Squamous cell carcinoma of skin, unspecified: Secondary | ICD-10-CM | POA: Diagnosis not present

## 2017-06-08 DIAGNOSIS — I5020 Unspecified systolic (congestive) heart failure: Secondary | ICD-10-CM | POA: Diagnosis not present

## 2017-06-08 DIAGNOSIS — G4733 Obstructive sleep apnea (adult) (pediatric): Secondary | ICD-10-CM | POA: Diagnosis not present

## 2017-06-09 DIAGNOSIS — N183 Chronic kidney disease, stage 3 (moderate): Secondary | ICD-10-CM | POA: Diagnosis not present

## 2017-06-09 DIAGNOSIS — D631 Anemia in chronic kidney disease: Secondary | ICD-10-CM | POA: Diagnosis not present

## 2017-06-09 DIAGNOSIS — I130 Hypertensive heart and chronic kidney disease with heart failure and stage 1 through stage 4 chronic kidney disease, or unspecified chronic kidney disease: Secondary | ICD-10-CM | POA: Diagnosis not present

## 2017-06-09 DIAGNOSIS — I5020 Unspecified systolic (congestive) heart failure: Secondary | ICD-10-CM | POA: Diagnosis not present

## 2017-06-09 DIAGNOSIS — S81801D Unspecified open wound, right lower leg, subsequent encounter: Secondary | ICD-10-CM | POA: Diagnosis not present

## 2017-06-09 DIAGNOSIS — E1122 Type 2 diabetes mellitus with diabetic chronic kidney disease: Secondary | ICD-10-CM | POA: Diagnosis not present

## 2017-06-10 DIAGNOSIS — S81801D Unspecified open wound, right lower leg, subsequent encounter: Secondary | ICD-10-CM | POA: Diagnosis not present

## 2017-06-10 DIAGNOSIS — I5020 Unspecified systolic (congestive) heart failure: Secondary | ICD-10-CM | POA: Diagnosis not present

## 2017-06-10 DIAGNOSIS — N183 Chronic kidney disease, stage 3 (moderate): Secondary | ICD-10-CM | POA: Diagnosis not present

## 2017-06-10 DIAGNOSIS — D631 Anemia in chronic kidney disease: Secondary | ICD-10-CM | POA: Diagnosis not present

## 2017-06-10 DIAGNOSIS — I130 Hypertensive heart and chronic kidney disease with heart failure and stage 1 through stage 4 chronic kidney disease, or unspecified chronic kidney disease: Secondary | ICD-10-CM | POA: Diagnosis not present

## 2017-06-10 DIAGNOSIS — E1122 Type 2 diabetes mellitus with diabetic chronic kidney disease: Secondary | ICD-10-CM | POA: Diagnosis not present

## 2017-06-11 DIAGNOSIS — N183 Chronic kidney disease, stage 3 (moderate): Secondary | ICD-10-CM | POA: Diagnosis not present

## 2017-06-11 DIAGNOSIS — I5020 Unspecified systolic (congestive) heart failure: Secondary | ICD-10-CM | POA: Diagnosis not present

## 2017-06-11 DIAGNOSIS — I130 Hypertensive heart and chronic kidney disease with heart failure and stage 1 through stage 4 chronic kidney disease, or unspecified chronic kidney disease: Secondary | ICD-10-CM | POA: Diagnosis not present

## 2017-06-11 DIAGNOSIS — D631 Anemia in chronic kidney disease: Secondary | ICD-10-CM | POA: Diagnosis not present

## 2017-06-11 DIAGNOSIS — S81801D Unspecified open wound, right lower leg, subsequent encounter: Secondary | ICD-10-CM | POA: Diagnosis not present

## 2017-06-11 DIAGNOSIS — E1122 Type 2 diabetes mellitus with diabetic chronic kidney disease: Secondary | ICD-10-CM | POA: Diagnosis not present

## 2017-06-14 DIAGNOSIS — I482 Chronic atrial fibrillation: Secondary | ICD-10-CM | POA: Diagnosis not present

## 2017-06-15 DIAGNOSIS — I130 Hypertensive heart and chronic kidney disease with heart failure and stage 1 through stage 4 chronic kidney disease, or unspecified chronic kidney disease: Secondary | ICD-10-CM | POA: Diagnosis not present

## 2017-06-15 DIAGNOSIS — N183 Chronic kidney disease, stage 3 (moderate): Secondary | ICD-10-CM | POA: Diagnosis not present

## 2017-06-15 DIAGNOSIS — D631 Anemia in chronic kidney disease: Secondary | ICD-10-CM | POA: Diagnosis not present

## 2017-06-15 DIAGNOSIS — E1122 Type 2 diabetes mellitus with diabetic chronic kidney disease: Secondary | ICD-10-CM | POA: Diagnosis not present

## 2017-06-15 DIAGNOSIS — I5020 Unspecified systolic (congestive) heart failure: Secondary | ICD-10-CM | POA: Diagnosis not present

## 2017-06-15 DIAGNOSIS — S81801D Unspecified open wound, right lower leg, subsequent encounter: Secondary | ICD-10-CM | POA: Diagnosis not present

## 2017-06-16 DIAGNOSIS — E1122 Type 2 diabetes mellitus with diabetic chronic kidney disease: Secondary | ICD-10-CM | POA: Diagnosis not present

## 2017-06-16 DIAGNOSIS — I5020 Unspecified systolic (congestive) heart failure: Secondary | ICD-10-CM | POA: Diagnosis not present

## 2017-06-16 DIAGNOSIS — S81801D Unspecified open wound, right lower leg, subsequent encounter: Secondary | ICD-10-CM | POA: Diagnosis not present

## 2017-06-16 DIAGNOSIS — D631 Anemia in chronic kidney disease: Secondary | ICD-10-CM | POA: Diagnosis not present

## 2017-06-16 DIAGNOSIS — I130 Hypertensive heart and chronic kidney disease with heart failure and stage 1 through stage 4 chronic kidney disease, or unspecified chronic kidney disease: Secondary | ICD-10-CM | POA: Diagnosis not present

## 2017-06-16 DIAGNOSIS — N183 Chronic kidney disease, stage 3 (moderate): Secondary | ICD-10-CM | POA: Diagnosis not present

## 2017-06-17 DIAGNOSIS — S8011XD Contusion of right lower leg, subsequent encounter: Secondary | ICD-10-CM | POA: Diagnosis not present

## 2017-06-17 DIAGNOSIS — E119 Type 2 diabetes mellitus without complications: Secondary | ICD-10-CM | POA: Diagnosis not present

## 2017-06-18 DIAGNOSIS — E1122 Type 2 diabetes mellitus with diabetic chronic kidney disease: Secondary | ICD-10-CM | POA: Diagnosis not present

## 2017-06-18 DIAGNOSIS — I5020 Unspecified systolic (congestive) heart failure: Secondary | ICD-10-CM | POA: Diagnosis not present

## 2017-06-18 DIAGNOSIS — S81801D Unspecified open wound, right lower leg, subsequent encounter: Secondary | ICD-10-CM | POA: Diagnosis not present

## 2017-06-18 DIAGNOSIS — N183 Chronic kidney disease, stage 3 (moderate): Secondary | ICD-10-CM | POA: Diagnosis not present

## 2017-06-18 DIAGNOSIS — I130 Hypertensive heart and chronic kidney disease with heart failure and stage 1 through stage 4 chronic kidney disease, or unspecified chronic kidney disease: Secondary | ICD-10-CM | POA: Diagnosis not present

## 2017-06-18 DIAGNOSIS — D631 Anemia in chronic kidney disease: Secondary | ICD-10-CM | POA: Diagnosis not present

## 2017-06-21 DIAGNOSIS — I5020 Unspecified systolic (congestive) heart failure: Secondary | ICD-10-CM | POA: Diagnosis not present

## 2017-06-21 DIAGNOSIS — I130 Hypertensive heart and chronic kidney disease with heart failure and stage 1 through stage 4 chronic kidney disease, or unspecified chronic kidney disease: Secondary | ICD-10-CM | POA: Diagnosis not present

## 2017-06-21 DIAGNOSIS — D631 Anemia in chronic kidney disease: Secondary | ICD-10-CM | POA: Diagnosis not present

## 2017-06-21 DIAGNOSIS — E1122 Type 2 diabetes mellitus with diabetic chronic kidney disease: Secondary | ICD-10-CM | POA: Diagnosis not present

## 2017-06-21 DIAGNOSIS — S81801D Unspecified open wound, right lower leg, subsequent encounter: Secondary | ICD-10-CM | POA: Diagnosis not present

## 2017-06-21 DIAGNOSIS — N183 Chronic kidney disease, stage 3 (moderate): Secondary | ICD-10-CM | POA: Diagnosis not present

## 2017-06-21 DIAGNOSIS — I482 Chronic atrial fibrillation: Secondary | ICD-10-CM | POA: Diagnosis not present

## 2017-06-21 DIAGNOSIS — Z7901 Long term (current) use of anticoagulants: Secondary | ICD-10-CM | POA: Diagnosis not present

## 2017-06-23 DIAGNOSIS — I130 Hypertensive heart and chronic kidney disease with heart failure and stage 1 through stage 4 chronic kidney disease, or unspecified chronic kidney disease: Secondary | ICD-10-CM | POA: Diagnosis not present

## 2017-06-23 DIAGNOSIS — D631 Anemia in chronic kidney disease: Secondary | ICD-10-CM | POA: Diagnosis not present

## 2017-06-23 DIAGNOSIS — N183 Chronic kidney disease, stage 3 (moderate): Secondary | ICD-10-CM | POA: Diagnosis not present

## 2017-06-23 DIAGNOSIS — I5020 Unspecified systolic (congestive) heart failure: Secondary | ICD-10-CM | POA: Diagnosis not present

## 2017-06-23 DIAGNOSIS — S81801D Unspecified open wound, right lower leg, subsequent encounter: Secondary | ICD-10-CM | POA: Diagnosis not present

## 2017-06-23 DIAGNOSIS — E1122 Type 2 diabetes mellitus with diabetic chronic kidney disease: Secondary | ICD-10-CM | POA: Diagnosis not present

## 2017-06-25 DIAGNOSIS — I5020 Unspecified systolic (congestive) heart failure: Secondary | ICD-10-CM | POA: Diagnosis not present

## 2017-06-25 DIAGNOSIS — S81801D Unspecified open wound, right lower leg, subsequent encounter: Secondary | ICD-10-CM | POA: Diagnosis not present

## 2017-06-25 DIAGNOSIS — N183 Chronic kidney disease, stage 3 (moderate): Secondary | ICD-10-CM | POA: Diagnosis not present

## 2017-06-25 DIAGNOSIS — D631 Anemia in chronic kidney disease: Secondary | ICD-10-CM | POA: Diagnosis not present

## 2017-06-25 DIAGNOSIS — E1122 Type 2 diabetes mellitus with diabetic chronic kidney disease: Secondary | ICD-10-CM | POA: Diagnosis not present

## 2017-06-25 DIAGNOSIS — I130 Hypertensive heart and chronic kidney disease with heart failure and stage 1 through stage 4 chronic kidney disease, or unspecified chronic kidney disease: Secondary | ICD-10-CM | POA: Diagnosis not present

## 2017-06-28 DIAGNOSIS — I130 Hypertensive heart and chronic kidney disease with heart failure and stage 1 through stage 4 chronic kidney disease, or unspecified chronic kidney disease: Secondary | ICD-10-CM | POA: Diagnosis not present

## 2017-06-28 DIAGNOSIS — E1122 Type 2 diabetes mellitus with diabetic chronic kidney disease: Secondary | ICD-10-CM | POA: Diagnosis not present

## 2017-06-28 DIAGNOSIS — N183 Chronic kidney disease, stage 3 (moderate): Secondary | ICD-10-CM | POA: Diagnosis not present

## 2017-06-28 DIAGNOSIS — D631 Anemia in chronic kidney disease: Secondary | ICD-10-CM | POA: Diagnosis not present

## 2017-06-28 DIAGNOSIS — I5020 Unspecified systolic (congestive) heart failure: Secondary | ICD-10-CM | POA: Diagnosis not present

## 2017-06-28 DIAGNOSIS — S81801D Unspecified open wound, right lower leg, subsequent encounter: Secondary | ICD-10-CM | POA: Diagnosis not present

## 2017-07-01 DIAGNOSIS — D631 Anemia in chronic kidney disease: Secondary | ICD-10-CM | POA: Diagnosis not present

## 2017-07-01 DIAGNOSIS — E1122 Type 2 diabetes mellitus with diabetic chronic kidney disease: Secondary | ICD-10-CM | POA: Diagnosis not present

## 2017-07-01 DIAGNOSIS — I5020 Unspecified systolic (congestive) heart failure: Secondary | ICD-10-CM | POA: Diagnosis not present

## 2017-07-01 DIAGNOSIS — I130 Hypertensive heart and chronic kidney disease with heart failure and stage 1 through stage 4 chronic kidney disease, or unspecified chronic kidney disease: Secondary | ICD-10-CM | POA: Diagnosis not present

## 2017-07-01 DIAGNOSIS — S81801D Unspecified open wound, right lower leg, subsequent encounter: Secondary | ICD-10-CM | POA: Diagnosis not present

## 2017-07-01 DIAGNOSIS — N183 Chronic kidney disease, stage 3 (moderate): Secondary | ICD-10-CM | POA: Diagnosis not present

## 2017-07-02 DIAGNOSIS — I130 Hypertensive heart and chronic kidney disease with heart failure and stage 1 through stage 4 chronic kidney disease, or unspecified chronic kidney disease: Secondary | ICD-10-CM | POA: Diagnosis not present

## 2017-07-02 DIAGNOSIS — N183 Chronic kidney disease, stage 3 (moderate): Secondary | ICD-10-CM | POA: Diagnosis not present

## 2017-07-02 DIAGNOSIS — D631 Anemia in chronic kidney disease: Secondary | ICD-10-CM | POA: Diagnosis not present

## 2017-07-02 DIAGNOSIS — E1122 Type 2 diabetes mellitus with diabetic chronic kidney disease: Secondary | ICD-10-CM | POA: Diagnosis not present

## 2017-07-02 DIAGNOSIS — I5020 Unspecified systolic (congestive) heart failure: Secondary | ICD-10-CM | POA: Diagnosis not present

## 2017-07-02 DIAGNOSIS — S81801D Unspecified open wound, right lower leg, subsequent encounter: Secondary | ICD-10-CM | POA: Diagnosis not present

## 2017-07-05 DIAGNOSIS — N183 Chronic kidney disease, stage 3 (moderate): Secondary | ICD-10-CM | POA: Diagnosis not present

## 2017-07-05 DIAGNOSIS — S81801D Unspecified open wound, right lower leg, subsequent encounter: Secondary | ICD-10-CM | POA: Diagnosis not present

## 2017-07-05 DIAGNOSIS — D631 Anemia in chronic kidney disease: Secondary | ICD-10-CM | POA: Diagnosis not present

## 2017-07-05 DIAGNOSIS — I130 Hypertensive heart and chronic kidney disease with heart failure and stage 1 through stage 4 chronic kidney disease, or unspecified chronic kidney disease: Secondary | ICD-10-CM | POA: Diagnosis not present

## 2017-07-05 DIAGNOSIS — I5020 Unspecified systolic (congestive) heart failure: Secondary | ICD-10-CM | POA: Diagnosis not present

## 2017-07-05 DIAGNOSIS — E1122 Type 2 diabetes mellitus with diabetic chronic kidney disease: Secondary | ICD-10-CM | POA: Diagnosis not present

## 2017-07-08 DIAGNOSIS — I5020 Unspecified systolic (congestive) heart failure: Secondary | ICD-10-CM | POA: Diagnosis not present

## 2017-07-08 DIAGNOSIS — E1122 Type 2 diabetes mellitus with diabetic chronic kidney disease: Secondary | ICD-10-CM | POA: Diagnosis not present

## 2017-07-08 DIAGNOSIS — N183 Chronic kidney disease, stage 3 (moderate): Secondary | ICD-10-CM | POA: Diagnosis not present

## 2017-07-08 DIAGNOSIS — I130 Hypertensive heart and chronic kidney disease with heart failure and stage 1 through stage 4 chronic kidney disease, or unspecified chronic kidney disease: Secondary | ICD-10-CM | POA: Diagnosis not present

## 2017-07-08 DIAGNOSIS — D631 Anemia in chronic kidney disease: Secondary | ICD-10-CM | POA: Diagnosis not present

## 2017-07-08 DIAGNOSIS — R69 Illness, unspecified: Secondary | ICD-10-CM | POA: Diagnosis not present

## 2017-07-08 DIAGNOSIS — S81801D Unspecified open wound, right lower leg, subsequent encounter: Secondary | ICD-10-CM | POA: Diagnosis not present

## 2017-07-09 DIAGNOSIS — I5020 Unspecified systolic (congestive) heart failure: Secondary | ICD-10-CM | POA: Diagnosis not present

## 2017-07-09 DIAGNOSIS — D631 Anemia in chronic kidney disease: Secondary | ICD-10-CM | POA: Diagnosis not present

## 2017-07-09 DIAGNOSIS — I130 Hypertensive heart and chronic kidney disease with heart failure and stage 1 through stage 4 chronic kidney disease, or unspecified chronic kidney disease: Secondary | ICD-10-CM | POA: Diagnosis not present

## 2017-07-09 DIAGNOSIS — E1122 Type 2 diabetes mellitus with diabetic chronic kidney disease: Secondary | ICD-10-CM | POA: Diagnosis not present

## 2017-07-09 DIAGNOSIS — G4733 Obstructive sleep apnea (adult) (pediatric): Secondary | ICD-10-CM | POA: Diagnosis not present

## 2017-07-09 DIAGNOSIS — S81801D Unspecified open wound, right lower leg, subsequent encounter: Secondary | ICD-10-CM | POA: Diagnosis not present

## 2017-07-09 DIAGNOSIS — N183 Chronic kidney disease, stage 3 (moderate): Secondary | ICD-10-CM | POA: Diagnosis not present

## 2017-07-12 DIAGNOSIS — I5020 Unspecified systolic (congestive) heart failure: Secondary | ICD-10-CM | POA: Diagnosis not present

## 2017-07-12 DIAGNOSIS — S81801D Unspecified open wound, right lower leg, subsequent encounter: Secondary | ICD-10-CM | POA: Diagnosis not present

## 2017-07-12 DIAGNOSIS — E1122 Type 2 diabetes mellitus with diabetic chronic kidney disease: Secondary | ICD-10-CM | POA: Diagnosis not present

## 2017-07-12 DIAGNOSIS — N183 Chronic kidney disease, stage 3 (moderate): Secondary | ICD-10-CM | POA: Diagnosis not present

## 2017-07-12 DIAGNOSIS — I130 Hypertensive heart and chronic kidney disease with heart failure and stage 1 through stage 4 chronic kidney disease, or unspecified chronic kidney disease: Secondary | ICD-10-CM | POA: Diagnosis not present

## 2017-07-12 DIAGNOSIS — D631 Anemia in chronic kidney disease: Secondary | ICD-10-CM | POA: Diagnosis not present

## 2017-07-14 DIAGNOSIS — Z951 Presence of aortocoronary bypass graft: Secondary | ICD-10-CM | POA: Diagnosis not present

## 2017-07-14 DIAGNOSIS — I2510 Atherosclerotic heart disease of native coronary artery without angina pectoris: Secondary | ICD-10-CM | POA: Diagnosis not present

## 2017-07-14 DIAGNOSIS — Z79899 Other long term (current) drug therapy: Secondary | ICD-10-CM | POA: Diagnosis not present

## 2017-07-14 DIAGNOSIS — I2721 Secondary pulmonary arterial hypertension: Secondary | ICD-10-CM | POA: Diagnosis not present

## 2017-07-14 DIAGNOSIS — I110 Hypertensive heart disease with heart failure: Secondary | ICD-10-CM | POA: Diagnosis not present

## 2017-07-14 DIAGNOSIS — R0602 Shortness of breath: Secondary | ICD-10-CM | POA: Diagnosis not present

## 2017-07-14 DIAGNOSIS — I482 Chronic atrial fibrillation: Secondary | ICD-10-CM | POA: Diagnosis not present

## 2017-07-14 DIAGNOSIS — J439 Emphysema, unspecified: Secondary | ICD-10-CM | POA: Diagnosis not present

## 2017-07-14 DIAGNOSIS — R600 Localized edema: Secondary | ICD-10-CM | POA: Diagnosis not present

## 2017-07-14 DIAGNOSIS — I272 Pulmonary hypertension, unspecified: Secondary | ICD-10-CM | POA: Diagnosis not present

## 2017-07-14 DIAGNOSIS — I5032 Chronic diastolic (congestive) heart failure: Secondary | ICD-10-CM | POA: Diagnosis not present

## 2017-07-15 DIAGNOSIS — M25521 Pain in right elbow: Secondary | ICD-10-CM | POA: Diagnosis not present

## 2017-07-15 DIAGNOSIS — M531 Cervicobrachial syndrome: Secondary | ICD-10-CM | POA: Diagnosis not present

## 2017-07-15 DIAGNOSIS — M9901 Segmental and somatic dysfunction of cervical region: Secondary | ICD-10-CM | POA: Diagnosis not present

## 2017-07-15 DIAGNOSIS — M6240 Contracture of muscle, unspecified site: Secondary | ICD-10-CM | POA: Diagnosis not present

## 2017-07-16 DIAGNOSIS — S81801D Unspecified open wound, right lower leg, subsequent encounter: Secondary | ICD-10-CM | POA: Diagnosis not present

## 2017-07-16 DIAGNOSIS — I130 Hypertensive heart and chronic kidney disease with heart failure and stage 1 through stage 4 chronic kidney disease, or unspecified chronic kidney disease: Secondary | ICD-10-CM | POA: Diagnosis not present

## 2017-07-16 DIAGNOSIS — I5020 Unspecified systolic (congestive) heart failure: Secondary | ICD-10-CM | POA: Diagnosis not present

## 2017-07-16 DIAGNOSIS — E1122 Type 2 diabetes mellitus with diabetic chronic kidney disease: Secondary | ICD-10-CM | POA: Diagnosis not present

## 2017-07-16 DIAGNOSIS — D631 Anemia in chronic kidney disease: Secondary | ICD-10-CM | POA: Diagnosis not present

## 2017-07-16 DIAGNOSIS — N183 Chronic kidney disease, stage 3 (moderate): Secondary | ICD-10-CM | POA: Diagnosis not present

## 2017-07-19 DIAGNOSIS — Z7901 Long term (current) use of anticoagulants: Secondary | ICD-10-CM | POA: Diagnosis not present

## 2017-07-19 DIAGNOSIS — I482 Chronic atrial fibrillation: Secondary | ICD-10-CM | POA: Diagnosis not present

## 2017-07-20 DIAGNOSIS — N183 Chronic kidney disease, stage 3 (moderate): Secondary | ICD-10-CM | POA: Diagnosis not present

## 2017-07-20 DIAGNOSIS — D631 Anemia in chronic kidney disease: Secondary | ICD-10-CM | POA: Diagnosis not present

## 2017-07-20 DIAGNOSIS — I5020 Unspecified systolic (congestive) heart failure: Secondary | ICD-10-CM | POA: Diagnosis not present

## 2017-07-20 DIAGNOSIS — S81801D Unspecified open wound, right lower leg, subsequent encounter: Secondary | ICD-10-CM | POA: Diagnosis not present

## 2017-07-20 DIAGNOSIS — M25521 Pain in right elbow: Secondary | ICD-10-CM | POA: Diagnosis not present

## 2017-07-20 DIAGNOSIS — M531 Cervicobrachial syndrome: Secondary | ICD-10-CM | POA: Diagnosis not present

## 2017-07-20 DIAGNOSIS — M9901 Segmental and somatic dysfunction of cervical region: Secondary | ICD-10-CM | POA: Diagnosis not present

## 2017-07-20 DIAGNOSIS — M6240 Contracture of muscle, unspecified site: Secondary | ICD-10-CM | POA: Diagnosis not present

## 2017-07-20 DIAGNOSIS — I130 Hypertensive heart and chronic kidney disease with heart failure and stage 1 through stage 4 chronic kidney disease, or unspecified chronic kidney disease: Secondary | ICD-10-CM | POA: Diagnosis not present

## 2017-07-20 DIAGNOSIS — E1122 Type 2 diabetes mellitus with diabetic chronic kidney disease: Secondary | ICD-10-CM | POA: Diagnosis not present

## 2017-07-21 DIAGNOSIS — R42 Dizziness and giddiness: Secondary | ICD-10-CM | POA: Diagnosis not present

## 2017-07-22 DIAGNOSIS — I5020 Unspecified systolic (congestive) heart failure: Secondary | ICD-10-CM | POA: Diagnosis not present

## 2017-07-22 DIAGNOSIS — N183 Chronic kidney disease, stage 3 (moderate): Secondary | ICD-10-CM | POA: Diagnosis not present

## 2017-07-22 DIAGNOSIS — I130 Hypertensive heart and chronic kidney disease with heart failure and stage 1 through stage 4 chronic kidney disease, or unspecified chronic kidney disease: Secondary | ICD-10-CM | POA: Diagnosis not present

## 2017-07-22 DIAGNOSIS — M531 Cervicobrachial syndrome: Secondary | ICD-10-CM | POA: Diagnosis not present

## 2017-07-22 DIAGNOSIS — M25521 Pain in right elbow: Secondary | ICD-10-CM | POA: Diagnosis not present

## 2017-07-22 DIAGNOSIS — M6240 Contracture of muscle, unspecified site: Secondary | ICD-10-CM | POA: Diagnosis not present

## 2017-07-22 DIAGNOSIS — S81801D Unspecified open wound, right lower leg, subsequent encounter: Secondary | ICD-10-CM | POA: Diagnosis not present

## 2017-07-22 DIAGNOSIS — D631 Anemia in chronic kidney disease: Secondary | ICD-10-CM | POA: Diagnosis not present

## 2017-07-22 DIAGNOSIS — M9901 Segmental and somatic dysfunction of cervical region: Secondary | ICD-10-CM | POA: Diagnosis not present

## 2017-07-22 DIAGNOSIS — E1122 Type 2 diabetes mellitus with diabetic chronic kidney disease: Secondary | ICD-10-CM | POA: Diagnosis not present

## 2017-07-23 DIAGNOSIS — M25521 Pain in right elbow: Secondary | ICD-10-CM | POA: Diagnosis not present

## 2017-07-23 DIAGNOSIS — M9901 Segmental and somatic dysfunction of cervical region: Secondary | ICD-10-CM | POA: Diagnosis not present

## 2017-07-23 DIAGNOSIS — M6240 Contracture of muscle, unspecified site: Secondary | ICD-10-CM | POA: Diagnosis not present

## 2017-07-23 DIAGNOSIS — M531 Cervicobrachial syndrome: Secondary | ICD-10-CM | POA: Diagnosis not present

## 2017-07-27 DIAGNOSIS — D631 Anemia in chronic kidney disease: Secondary | ICD-10-CM | POA: Diagnosis not present

## 2017-07-27 DIAGNOSIS — N183 Chronic kidney disease, stage 3 (moderate): Secondary | ICD-10-CM | POA: Diagnosis not present

## 2017-07-27 DIAGNOSIS — S81801D Unspecified open wound, right lower leg, subsequent encounter: Secondary | ICD-10-CM | POA: Diagnosis not present

## 2017-07-27 DIAGNOSIS — E1122 Type 2 diabetes mellitus with diabetic chronic kidney disease: Secondary | ICD-10-CM | POA: Diagnosis not present

## 2017-07-27 DIAGNOSIS — I5020 Unspecified systolic (congestive) heart failure: Secondary | ICD-10-CM | POA: Diagnosis not present

## 2017-07-27 DIAGNOSIS — I130 Hypertensive heart and chronic kidney disease with heart failure and stage 1 through stage 4 chronic kidney disease, or unspecified chronic kidney disease: Secondary | ICD-10-CM | POA: Diagnosis not present

## 2017-07-29 DIAGNOSIS — M9901 Segmental and somatic dysfunction of cervical region: Secondary | ICD-10-CM | POA: Diagnosis not present

## 2017-07-29 DIAGNOSIS — M6240 Contracture of muscle, unspecified site: Secondary | ICD-10-CM | POA: Diagnosis not present

## 2017-07-29 DIAGNOSIS — M25521 Pain in right elbow: Secondary | ICD-10-CM | POA: Diagnosis not present

## 2017-07-29 DIAGNOSIS — M531 Cervicobrachial syndrome: Secondary | ICD-10-CM | POA: Diagnosis not present

## 2017-07-30 DIAGNOSIS — I5020 Unspecified systolic (congestive) heart failure: Secondary | ICD-10-CM | POA: Diagnosis not present

## 2017-07-30 DIAGNOSIS — M9901 Segmental and somatic dysfunction of cervical region: Secondary | ICD-10-CM | POA: Diagnosis not present

## 2017-07-30 DIAGNOSIS — M6240 Contracture of muscle, unspecified site: Secondary | ICD-10-CM | POA: Diagnosis not present

## 2017-07-30 DIAGNOSIS — E1122 Type 2 diabetes mellitus with diabetic chronic kidney disease: Secondary | ICD-10-CM | POA: Diagnosis not present

## 2017-07-30 DIAGNOSIS — M25521 Pain in right elbow: Secondary | ICD-10-CM | POA: Diagnosis not present

## 2017-07-30 DIAGNOSIS — S81801D Unspecified open wound, right lower leg, subsequent encounter: Secondary | ICD-10-CM | POA: Diagnosis not present

## 2017-07-30 DIAGNOSIS — M531 Cervicobrachial syndrome: Secondary | ICD-10-CM | POA: Diagnosis not present

## 2017-07-30 DIAGNOSIS — I130 Hypertensive heart and chronic kidney disease with heart failure and stage 1 through stage 4 chronic kidney disease, or unspecified chronic kidney disease: Secondary | ICD-10-CM | POA: Diagnosis not present

## 2017-07-30 DIAGNOSIS — N183 Chronic kidney disease, stage 3 (moderate): Secondary | ICD-10-CM | POA: Diagnosis not present

## 2017-07-30 DIAGNOSIS — D631 Anemia in chronic kidney disease: Secondary | ICD-10-CM | POA: Diagnosis not present

## 2017-08-03 DIAGNOSIS — E1122 Type 2 diabetes mellitus with diabetic chronic kidney disease: Secondary | ICD-10-CM | POA: Diagnosis not present

## 2017-08-03 DIAGNOSIS — I5020 Unspecified systolic (congestive) heart failure: Secondary | ICD-10-CM | POA: Diagnosis not present

## 2017-08-03 DIAGNOSIS — S81801D Unspecified open wound, right lower leg, subsequent encounter: Secondary | ICD-10-CM | POA: Diagnosis not present

## 2017-08-03 DIAGNOSIS — D631 Anemia in chronic kidney disease: Secondary | ICD-10-CM | POA: Diagnosis not present

## 2017-08-03 DIAGNOSIS — M6240 Contracture of muscle, unspecified site: Secondary | ICD-10-CM | POA: Diagnosis not present

## 2017-08-03 DIAGNOSIS — I130 Hypertensive heart and chronic kidney disease with heart failure and stage 1 through stage 4 chronic kidney disease, or unspecified chronic kidney disease: Secondary | ICD-10-CM | POA: Diagnosis not present

## 2017-08-03 DIAGNOSIS — N183 Chronic kidney disease, stage 3 (moderate): Secondary | ICD-10-CM | POA: Diagnosis not present

## 2017-08-03 DIAGNOSIS — M25521 Pain in right elbow: Secondary | ICD-10-CM | POA: Diagnosis not present

## 2017-08-03 DIAGNOSIS — M531 Cervicobrachial syndrome: Secondary | ICD-10-CM | POA: Diagnosis not present

## 2017-08-03 DIAGNOSIS — M9901 Segmental and somatic dysfunction of cervical region: Secondary | ICD-10-CM | POA: Diagnosis not present

## 2017-08-04 DIAGNOSIS — M531 Cervicobrachial syndrome: Secondary | ICD-10-CM | POA: Diagnosis not present

## 2017-08-04 DIAGNOSIS — M25521 Pain in right elbow: Secondary | ICD-10-CM | POA: Diagnosis not present

## 2017-08-04 DIAGNOSIS — M9901 Segmental and somatic dysfunction of cervical region: Secondary | ICD-10-CM | POA: Diagnosis not present

## 2017-08-04 DIAGNOSIS — M6240 Contracture of muscle, unspecified site: Secondary | ICD-10-CM | POA: Diagnosis not present

## 2017-08-05 DIAGNOSIS — S81801D Unspecified open wound, right lower leg, subsequent encounter: Secondary | ICD-10-CM | POA: Diagnosis not present

## 2017-08-05 DIAGNOSIS — D631 Anemia in chronic kidney disease: Secondary | ICD-10-CM | POA: Diagnosis not present

## 2017-08-05 DIAGNOSIS — M25521 Pain in right elbow: Secondary | ICD-10-CM | POA: Diagnosis not present

## 2017-08-05 DIAGNOSIS — N183 Chronic kidney disease, stage 3 (moderate): Secondary | ICD-10-CM | POA: Diagnosis not present

## 2017-08-05 DIAGNOSIS — I130 Hypertensive heart and chronic kidney disease with heart failure and stage 1 through stage 4 chronic kidney disease, or unspecified chronic kidney disease: Secondary | ICD-10-CM | POA: Diagnosis not present

## 2017-08-05 DIAGNOSIS — E1122 Type 2 diabetes mellitus with diabetic chronic kidney disease: Secondary | ICD-10-CM | POA: Diagnosis not present

## 2017-08-05 DIAGNOSIS — I5020 Unspecified systolic (congestive) heart failure: Secondary | ICD-10-CM | POA: Diagnosis not present

## 2017-08-05 DIAGNOSIS — M9901 Segmental and somatic dysfunction of cervical region: Secondary | ICD-10-CM | POA: Diagnosis not present

## 2017-08-05 DIAGNOSIS — M6240 Contracture of muscle, unspecified site: Secondary | ICD-10-CM | POA: Diagnosis not present

## 2017-08-05 DIAGNOSIS — M531 Cervicobrachial syndrome: Secondary | ICD-10-CM | POA: Diagnosis not present

## 2017-08-08 DIAGNOSIS — N183 Chronic kidney disease, stage 3 (moderate): Secondary | ICD-10-CM | POA: Diagnosis not present

## 2017-08-08 DIAGNOSIS — E1122 Type 2 diabetes mellitus with diabetic chronic kidney disease: Secondary | ICD-10-CM | POA: Diagnosis not present

## 2017-08-08 DIAGNOSIS — I130 Hypertensive heart and chronic kidney disease with heart failure and stage 1 through stage 4 chronic kidney disease, or unspecified chronic kidney disease: Secondary | ICD-10-CM | POA: Diagnosis not present

## 2017-08-08 DIAGNOSIS — S81801D Unspecified open wound, right lower leg, subsequent encounter: Secondary | ICD-10-CM | POA: Diagnosis not present

## 2017-08-08 DIAGNOSIS — D631 Anemia in chronic kidney disease: Secondary | ICD-10-CM | POA: Diagnosis not present

## 2017-08-08 DIAGNOSIS — I5020 Unspecified systolic (congestive) heart failure: Secondary | ICD-10-CM | POA: Diagnosis not present

## 2017-08-09 DIAGNOSIS — M25521 Pain in right elbow: Secondary | ICD-10-CM | POA: Diagnosis not present

## 2017-08-09 DIAGNOSIS — M6240 Contracture of muscle, unspecified site: Secondary | ICD-10-CM | POA: Diagnosis not present

## 2017-08-09 DIAGNOSIS — G4733 Obstructive sleep apnea (adult) (pediatric): Secondary | ICD-10-CM | POA: Diagnosis not present

## 2017-08-09 DIAGNOSIS — M531 Cervicobrachial syndrome: Secondary | ICD-10-CM | POA: Diagnosis not present

## 2017-08-09 DIAGNOSIS — M9901 Segmental and somatic dysfunction of cervical region: Secondary | ICD-10-CM | POA: Diagnosis not present

## 2017-08-10 DIAGNOSIS — M6240 Contracture of muscle, unspecified site: Secondary | ICD-10-CM | POA: Diagnosis not present

## 2017-08-10 DIAGNOSIS — M531 Cervicobrachial syndrome: Secondary | ICD-10-CM | POA: Diagnosis not present

## 2017-08-10 DIAGNOSIS — M25521 Pain in right elbow: Secondary | ICD-10-CM | POA: Diagnosis not present

## 2017-08-10 DIAGNOSIS — M9901 Segmental and somatic dysfunction of cervical region: Secondary | ICD-10-CM | POA: Diagnosis not present

## 2017-08-11 DIAGNOSIS — M9901 Segmental and somatic dysfunction of cervical region: Secondary | ICD-10-CM | POA: Diagnosis not present

## 2017-08-11 DIAGNOSIS — M531 Cervicobrachial syndrome: Secondary | ICD-10-CM | POA: Diagnosis not present

## 2017-08-11 DIAGNOSIS — M6240 Contracture of muscle, unspecified site: Secondary | ICD-10-CM | POA: Diagnosis not present

## 2017-08-11 DIAGNOSIS — M25521 Pain in right elbow: Secondary | ICD-10-CM | POA: Diagnosis not present

## 2017-08-16 DIAGNOSIS — M531 Cervicobrachial syndrome: Secondary | ICD-10-CM | POA: Diagnosis not present

## 2017-08-16 DIAGNOSIS — Z7901 Long term (current) use of anticoagulants: Secondary | ICD-10-CM | POA: Diagnosis not present

## 2017-08-16 DIAGNOSIS — M25521 Pain in right elbow: Secondary | ICD-10-CM | POA: Diagnosis not present

## 2017-08-16 DIAGNOSIS — M6240 Contracture of muscle, unspecified site: Secondary | ICD-10-CM | POA: Diagnosis not present

## 2017-08-16 DIAGNOSIS — M9901 Segmental and somatic dysfunction of cervical region: Secondary | ICD-10-CM | POA: Diagnosis not present

## 2017-08-16 DIAGNOSIS — I482 Chronic atrial fibrillation: Secondary | ICD-10-CM | POA: Diagnosis not present

## 2017-08-19 DIAGNOSIS — M9901 Segmental and somatic dysfunction of cervical region: Secondary | ICD-10-CM | POA: Diagnosis not present

## 2017-08-19 DIAGNOSIS — M6240 Contracture of muscle, unspecified site: Secondary | ICD-10-CM | POA: Diagnosis not present

## 2017-08-19 DIAGNOSIS — M25521 Pain in right elbow: Secondary | ICD-10-CM | POA: Diagnosis not present

## 2017-08-19 DIAGNOSIS — R69 Illness, unspecified: Secondary | ICD-10-CM | POA: Diagnosis not present

## 2017-08-19 DIAGNOSIS — M531 Cervicobrachial syndrome: Secondary | ICD-10-CM | POA: Diagnosis not present

## 2017-08-20 DIAGNOSIS — N182 Chronic kidney disease, stage 2 (mild): Secondary | ICD-10-CM | POA: Diagnosis not present

## 2017-08-20 DIAGNOSIS — I130 Hypertensive heart and chronic kidney disease with heart failure and stage 1 through stage 4 chronic kidney disease, or unspecified chronic kidney disease: Secondary | ICD-10-CM | POA: Diagnosis not present

## 2017-08-20 DIAGNOSIS — E1129 Type 2 diabetes mellitus with other diabetic kidney complication: Secondary | ICD-10-CM | POA: Diagnosis not present

## 2017-08-23 DIAGNOSIS — I482 Chronic atrial fibrillation: Secondary | ICD-10-CM | POA: Diagnosis not present

## 2017-08-23 DIAGNOSIS — M6240 Contracture of muscle, unspecified site: Secondary | ICD-10-CM | POA: Diagnosis not present

## 2017-08-23 DIAGNOSIS — M25521 Pain in right elbow: Secondary | ICD-10-CM | POA: Diagnosis not present

## 2017-08-23 DIAGNOSIS — M531 Cervicobrachial syndrome: Secondary | ICD-10-CM | POA: Diagnosis not present

## 2017-08-23 DIAGNOSIS — M9901 Segmental and somatic dysfunction of cervical region: Secondary | ICD-10-CM | POA: Diagnosis not present

## 2017-08-26 DIAGNOSIS — M531 Cervicobrachial syndrome: Secondary | ICD-10-CM | POA: Diagnosis not present

## 2017-08-26 DIAGNOSIS — M6240 Contracture of muscle, unspecified site: Secondary | ICD-10-CM | POA: Diagnosis not present

## 2017-08-26 DIAGNOSIS — M9901 Segmental and somatic dysfunction of cervical region: Secondary | ICD-10-CM | POA: Diagnosis not present

## 2017-08-26 DIAGNOSIS — M25521 Pain in right elbow: Secondary | ICD-10-CM | POA: Diagnosis not present

## 2017-08-31 DIAGNOSIS — M531 Cervicobrachial syndrome: Secondary | ICD-10-CM | POA: Diagnosis not present

## 2017-08-31 DIAGNOSIS — M9901 Segmental and somatic dysfunction of cervical region: Secondary | ICD-10-CM | POA: Diagnosis not present

## 2017-08-31 DIAGNOSIS — M25521 Pain in right elbow: Secondary | ICD-10-CM | POA: Diagnosis not present

## 2017-08-31 DIAGNOSIS — M6240 Contracture of muscle, unspecified site: Secondary | ICD-10-CM | POA: Diagnosis not present

## 2017-09-01 DIAGNOSIS — I255 Ischemic cardiomyopathy: Secondary | ICD-10-CM | POA: Diagnosis not present

## 2017-09-01 DIAGNOSIS — I872 Venous insufficiency (chronic) (peripheral): Secondary | ICD-10-CM | POA: Diagnosis not present

## 2017-09-01 DIAGNOSIS — I340 Nonrheumatic mitral (valve) insufficiency: Secondary | ICD-10-CM | POA: Diagnosis not present

## 2017-09-01 DIAGNOSIS — E785 Hyperlipidemia, unspecified: Secondary | ICD-10-CM | POA: Diagnosis not present

## 2017-09-01 DIAGNOSIS — I6521 Occlusion and stenosis of right carotid artery: Secondary | ICD-10-CM | POA: Diagnosis not present

## 2017-09-01 DIAGNOSIS — I10 Essential (primary) hypertension: Secondary | ICD-10-CM | POA: Diagnosis not present

## 2017-09-01 DIAGNOSIS — I4460 Unspecified fascicular block: Secondary | ICD-10-CM | POA: Diagnosis not present

## 2017-09-01 DIAGNOSIS — I2510 Atherosclerotic heart disease of native coronary artery without angina pectoris: Secondary | ICD-10-CM | POA: Diagnosis not present

## 2017-09-01 DIAGNOSIS — N189 Chronic kidney disease, unspecified: Secondary | ICD-10-CM | POA: Diagnosis not present

## 2017-09-01 DIAGNOSIS — I4892 Unspecified atrial flutter: Secondary | ICD-10-CM | POA: Diagnosis not present

## 2017-09-06 DIAGNOSIS — G4733 Obstructive sleep apnea (adult) (pediatric): Secondary | ICD-10-CM | POA: Diagnosis not present

## 2017-09-13 DIAGNOSIS — I482 Chronic atrial fibrillation: Secondary | ICD-10-CM | POA: Diagnosis not present

## 2017-09-13 DIAGNOSIS — Z7901 Long term (current) use of anticoagulants: Secondary | ICD-10-CM | POA: Diagnosis not present

## 2017-09-27 DIAGNOSIS — I482 Chronic atrial fibrillation: Secondary | ICD-10-CM | POA: Diagnosis not present

## 2017-09-29 DIAGNOSIS — E119 Type 2 diabetes mellitus without complications: Secondary | ICD-10-CM | POA: Diagnosis not present

## 2017-09-29 DIAGNOSIS — D696 Thrombocytopenia, unspecified: Secondary | ICD-10-CM | POA: Diagnosis not present

## 2017-09-29 DIAGNOSIS — I10 Essential (primary) hypertension: Secondary | ICD-10-CM | POA: Diagnosis not present

## 2017-09-29 DIAGNOSIS — J449 Chronic obstructive pulmonary disease, unspecified: Secondary | ICD-10-CM | POA: Diagnosis not present

## 2017-09-29 DIAGNOSIS — K5790 Diverticulosis of intestine, part unspecified, without perforation or abscess without bleeding: Secondary | ICD-10-CM | POA: Diagnosis not present

## 2017-09-29 DIAGNOSIS — I872 Venous insufficiency (chronic) (peripheral): Secondary | ICD-10-CM | POA: Diagnosis not present

## 2017-09-29 DIAGNOSIS — N183 Chronic kidney disease, stage 3 (moderate): Secondary | ICD-10-CM | POA: Diagnosis not present

## 2017-09-29 DIAGNOSIS — Z79899 Other long term (current) drug therapy: Secondary | ICD-10-CM | POA: Diagnosis not present

## 2017-09-29 DIAGNOSIS — Z125 Encounter for screening for malignant neoplasm of prostate: Secondary | ICD-10-CM | POA: Diagnosis not present

## 2017-09-29 DIAGNOSIS — M109 Gout, unspecified: Secondary | ICD-10-CM | POA: Diagnosis not present

## 2017-09-29 DIAGNOSIS — E785 Hyperlipidemia, unspecified: Secondary | ICD-10-CM | POA: Diagnosis not present

## 2017-09-29 DIAGNOSIS — I482 Chronic atrial fibrillation: Secondary | ICD-10-CM | POA: Diagnosis not present

## 2017-09-29 DIAGNOSIS — R69 Illness, unspecified: Secondary | ICD-10-CM | POA: Diagnosis not present

## 2017-09-30 DIAGNOSIS — E785 Hyperlipidemia, unspecified: Secondary | ICD-10-CM | POA: Diagnosis not present

## 2017-09-30 DIAGNOSIS — J449 Chronic obstructive pulmonary disease, unspecified: Secondary | ICD-10-CM | POA: Diagnosis not present

## 2017-09-30 DIAGNOSIS — E119 Type 2 diabetes mellitus without complications: Secondary | ICD-10-CM | POA: Diagnosis not present

## 2017-09-30 DIAGNOSIS — I4891 Unspecified atrial fibrillation: Secondary | ICD-10-CM | POA: Diagnosis not present

## 2017-09-30 DIAGNOSIS — R69 Illness, unspecified: Secondary | ICD-10-CM | POA: Diagnosis not present

## 2017-09-30 DIAGNOSIS — I110 Hypertensive heart disease with heart failure: Secondary | ICD-10-CM | POA: Diagnosis not present

## 2017-09-30 DIAGNOSIS — I509 Heart failure, unspecified: Secondary | ICD-10-CM | POA: Diagnosis not present

## 2017-09-30 DIAGNOSIS — E669 Obesity, unspecified: Secondary | ICD-10-CM | POA: Diagnosis not present

## 2017-09-30 DIAGNOSIS — G4700 Insomnia, unspecified: Secondary | ICD-10-CM | POA: Diagnosis not present

## 2017-09-30 DIAGNOSIS — Z794 Long term (current) use of insulin: Secondary | ICD-10-CM | POA: Diagnosis not present

## 2017-10-06 DIAGNOSIS — R69 Illness, unspecified: Secondary | ICD-10-CM | POA: Diagnosis not present

## 2017-10-06 DIAGNOSIS — J449 Chronic obstructive pulmonary disease, unspecified: Secondary | ICD-10-CM | POA: Diagnosis not present

## 2017-10-11 DIAGNOSIS — Z7901 Long term (current) use of anticoagulants: Secondary | ICD-10-CM | POA: Diagnosis not present

## 2017-10-11 DIAGNOSIS — I482 Chronic atrial fibrillation: Secondary | ICD-10-CM | POA: Diagnosis not present

## 2017-11-01 DIAGNOSIS — I482 Chronic atrial fibrillation: Secondary | ICD-10-CM | POA: Diagnosis not present

## 2017-11-08 ENCOUNTER — Telehealth: Payer: Self-pay | Admitting: Pulmonary Disease

## 2017-11-08 DIAGNOSIS — Z7901 Long term (current) use of anticoagulants: Secondary | ICD-10-CM | POA: Diagnosis not present

## 2017-11-08 DIAGNOSIS — I482 Chronic atrial fibrillation: Secondary | ICD-10-CM | POA: Diagnosis not present

## 2017-11-08 MED ORDER — FLUTICASONE-SALMETEROL 250-50 MCG/DOSE IN AEPB: 1.0000 | INHALATION_SPRAY | Freq: Two times a day (BID) | RESPIRATORY_TRACT | 3 refills | Status: DC

## 2017-11-08 MED ORDER — UMECLIDINIUM BROMIDE 62.5 MCG/INH IN AEPB: 1.0000 | INHALATION_SPRAY | Freq: Every day | RESPIRATORY_TRACT | 3 refills | Status: DC

## 2017-11-08 NOTE — Telephone Encounter (Signed)
Due to patient being in the donut hole he is asking that we send prescriptions in the mail so that he can send it to a special pharmacy. Printed and sent.

## 2017-11-30 DIAGNOSIS — R69 Illness, unspecified: Secondary | ICD-10-CM | POA: Diagnosis not present

## 2017-12-06 DIAGNOSIS — Z7901 Long term (current) use of anticoagulants: Secondary | ICD-10-CM | POA: Diagnosis not present

## 2017-12-06 DIAGNOSIS — I482 Chronic atrial fibrillation: Secondary | ICD-10-CM | POA: Diagnosis not present

## 2018-01-03 DIAGNOSIS — Z7901 Long term (current) use of anticoagulants: Secondary | ICD-10-CM | POA: Diagnosis not present

## 2018-01-03 DIAGNOSIS — I482 Chronic atrial fibrillation: Secondary | ICD-10-CM | POA: Diagnosis not present

## 2018-01-10 DIAGNOSIS — I482 Chronic atrial fibrillation: Secondary | ICD-10-CM | POA: Diagnosis not present

## 2018-01-18 DIAGNOSIS — L570 Actinic keratosis: Secondary | ICD-10-CM | POA: Diagnosis not present

## 2018-01-18 DIAGNOSIS — D692 Other nonthrombocytopenic purpura: Secondary | ICD-10-CM | POA: Diagnosis not present

## 2018-01-18 DIAGNOSIS — L820 Inflamed seborrheic keratosis: Secondary | ICD-10-CM | POA: Diagnosis not present

## 2018-01-18 DIAGNOSIS — L821 Other seborrheic keratosis: Secondary | ICD-10-CM | POA: Diagnosis not present

## 2018-01-18 DIAGNOSIS — Z85828 Personal history of other malignant neoplasm of skin: Secondary | ICD-10-CM | POA: Diagnosis not present

## 2018-01-18 DIAGNOSIS — D1801 Hemangioma of skin and subcutaneous tissue: Secondary | ICD-10-CM | POA: Diagnosis not present

## 2018-01-27 DIAGNOSIS — R69 Illness, unspecified: Secondary | ICD-10-CM | POA: Diagnosis not present

## 2018-01-31 DIAGNOSIS — I482 Chronic atrial fibrillation: Secondary | ICD-10-CM | POA: Diagnosis not present

## 2018-01-31 DIAGNOSIS — R69 Illness, unspecified: Secondary | ICD-10-CM | POA: Diagnosis not present

## 2018-01-31 DIAGNOSIS — Z7901 Long term (current) use of anticoagulants: Secondary | ICD-10-CM | POA: Diagnosis not present

## 2018-02-02 DIAGNOSIS — E785 Hyperlipidemia, unspecified: Secondary | ICD-10-CM | POA: Diagnosis not present

## 2018-02-02 DIAGNOSIS — I639 Cerebral infarction, unspecified: Secondary | ICD-10-CM | POA: Diagnosis not present

## 2018-02-02 DIAGNOSIS — Z0001 Encounter for general adult medical examination with abnormal findings: Secondary | ICD-10-CM | POA: Diagnosis not present

## 2018-02-02 DIAGNOSIS — D751 Secondary polycythemia: Secondary | ICD-10-CM | POA: Diagnosis not present

## 2018-02-02 DIAGNOSIS — Z79899 Other long term (current) drug therapy: Secondary | ICD-10-CM | POA: Diagnosis not present

## 2018-02-02 DIAGNOSIS — J42 Unspecified chronic bronchitis: Secondary | ICD-10-CM | POA: Diagnosis not present

## 2018-02-02 DIAGNOSIS — I482 Chronic atrial fibrillation: Secondary | ICD-10-CM | POA: Diagnosis not present

## 2018-02-02 DIAGNOSIS — I10 Essential (primary) hypertension: Secondary | ICD-10-CM | POA: Diagnosis not present

## 2018-02-02 DIAGNOSIS — G4730 Sleep apnea, unspecified: Secondary | ICD-10-CM | POA: Diagnosis not present

## 2018-02-02 DIAGNOSIS — I259 Chronic ischemic heart disease, unspecified: Secondary | ICD-10-CM | POA: Diagnosis not present

## 2018-02-02 DIAGNOSIS — E119 Type 2 diabetes mellitus without complications: Secondary | ICD-10-CM | POA: Diagnosis not present

## 2018-02-14 DIAGNOSIS — I482 Chronic atrial fibrillation: Secondary | ICD-10-CM | POA: Diagnosis not present

## 2018-02-17 DIAGNOSIS — R69 Illness, unspecified: Secondary | ICD-10-CM | POA: Diagnosis not present

## 2018-02-18 DIAGNOSIS — N182 Chronic kidney disease, stage 2 (mild): Secondary | ICD-10-CM | POA: Diagnosis not present

## 2018-02-18 DIAGNOSIS — I130 Hypertensive heart and chronic kidney disease with heart failure and stage 1 through stage 4 chronic kidney disease, or unspecified chronic kidney disease: Secondary | ICD-10-CM | POA: Diagnosis not present

## 2018-02-18 DIAGNOSIS — E1129 Type 2 diabetes mellitus with other diabetic kidney complication: Secondary | ICD-10-CM | POA: Diagnosis not present

## 2018-02-28 DIAGNOSIS — I482 Chronic atrial fibrillation: Secondary | ICD-10-CM | POA: Diagnosis not present

## 2018-02-28 DIAGNOSIS — Z7901 Long term (current) use of anticoagulants: Secondary | ICD-10-CM | POA: Diagnosis not present

## 2018-03-01 DIAGNOSIS — I4892 Unspecified atrial flutter: Secondary | ICD-10-CM | POA: Diagnosis not present

## 2018-03-01 DIAGNOSIS — I4460 Unspecified fascicular block: Secondary | ICD-10-CM | POA: Diagnosis not present

## 2018-03-01 DIAGNOSIS — N189 Chronic kidney disease, unspecified: Secondary | ICD-10-CM | POA: Diagnosis not present

## 2018-03-01 DIAGNOSIS — I340 Nonrheumatic mitral (valve) insufficiency: Secondary | ICD-10-CM | POA: Diagnosis not present

## 2018-03-01 DIAGNOSIS — I255 Ischemic cardiomyopathy: Secondary | ICD-10-CM | POA: Diagnosis not present

## 2018-03-01 DIAGNOSIS — I6521 Occlusion and stenosis of right carotid artery: Secondary | ICD-10-CM | POA: Diagnosis not present

## 2018-03-01 DIAGNOSIS — I872 Venous insufficiency (chronic) (peripheral): Secondary | ICD-10-CM | POA: Diagnosis not present

## 2018-03-01 DIAGNOSIS — I10 Essential (primary) hypertension: Secondary | ICD-10-CM | POA: Diagnosis not present

## 2018-03-01 DIAGNOSIS — E785 Hyperlipidemia, unspecified: Secondary | ICD-10-CM | POA: Diagnosis not present

## 2018-03-01 DIAGNOSIS — I2510 Atherosclerotic heart disease of native coronary artery without angina pectoris: Secondary | ICD-10-CM | POA: Diagnosis not present

## 2018-03-11 DIAGNOSIS — I480 Paroxysmal atrial fibrillation: Secondary | ICD-10-CM | POA: Diagnosis not present

## 2018-03-28 DIAGNOSIS — Z7901 Long term (current) use of anticoagulants: Secondary | ICD-10-CM | POA: Diagnosis not present

## 2018-03-28 DIAGNOSIS — I482 Chronic atrial fibrillation: Secondary | ICD-10-CM | POA: Diagnosis not present

## 2018-04-02 DIAGNOSIS — R69 Illness, unspecified: Secondary | ICD-10-CM | POA: Diagnosis not present

## 2018-04-11 ENCOUNTER — Encounter: Payer: Self-pay | Admitting: Neurology

## 2018-04-11 ENCOUNTER — Ambulatory Visit: Payer: Medicare HMO | Admitting: Neurology

## 2018-04-11 VITALS — BP 122/82 | HR 68 | Ht 71.5 in | Wt 267.0 lb

## 2018-04-11 DIAGNOSIS — R251 Tremor, unspecified: Secondary | ICD-10-CM

## 2018-04-11 NOTE — Progress Notes (Signed)
Subjective:    Patient ID: DEWITT JUDICE is a 72 y.o. male.  HPI     Star Age, MD, PhD [facility] [address] [place], [place] [address]  Dear Pricilla Holm,   I saw your patient, Earmon Sherrow, upon your kind request, in my neurologic clinic today for evaluation of his tremors. The patient is accompanied by his wife today. I have previously evaluated him for hand tremors over 3 and half years ago. As you know, Mr. Satter is a 72 year old right-handed gentleman with an underlying complex medical history of hypertension, hyperlipidemia, heart disease, COPD, stroke, sleep apnea, history of A. fib, and obesity, who reports a several year Hx of b/l hand tremors, worse on the L. He has noticed some progression over time. He has no telltale family history of tremors but his older brother recently reported a hand tremor to him. His wife has some concerns about his driving, she has noticed that he has had some erratic driving recently.  He has had some exacerbation of his depression recently. He had been off the sertraline and restarted it which improved his depression.  I reviewed your office note from 02/17/2018, which you kindly included. He had blood work through your office on 31 2000 1990 reviewed the results: Liver function was unremarkable, hemoglobin A1c was 6.8, BMP showed BUN of 31, creatinine 1.35. Lipid panel showed total cholesterol of 138, LDL 71, triglycerides 199.  Previously:  08/10/2014: 72 year old right-handed gentleman with an underlying complex medical history of obesity, kidney disease, arthritis, A. fib, ischemic heart disease, status post CABG, stroke in 4/03 (saw neurologist in [place] ), type 2 diabetes, hyperlipidemia, OSA on CPAP, and hypertension, who reports intermittent bilateral upper extremity tremors for the past few years, more so in the last year, L more than R. this is bothersome to him at times when he tries to do  fine motor tasks. He has had trouble with shaving and with feeding himself. He has an occasional rest tremor. He does not have a family history of tremors. He does not have a history of head injury. He does have low back pain. He does not have a family history of Parkinson's disease. Tremor is every day, but mostly mild and certainly intermittent. He quit smoking on 06/17/2011. He quit smoking after his open-heart surgery but restarted it after he started working again.  He sees a cardiologist, he sees a pulmonologist in [place], [place]. He also has a nephrologist. He had an eye exam a year ago. He is due for his yearly physical with you next month. He's never had any thyroid dysfunction as far as he knows. He has been on sertraline 150 mg for quite some time. He has been on sertraline for years. His wife wonders if it is effective at this time. She feels that he is quite irritable at times. He does not report much in the way of depressive symptoms and feels his mood is fair. He has been on Symbicort, and Spiriva and as needed pro-air.  His Past Medical History Is Significant For: Past Medical History:  Diagnosis Date  . Atrial fibrillation ([place])   . Atrial fibrillation ([place])   . COPD (chronic obstructive pulmonary disease) ([place])   . Depression   . Diabetes mellitus without complication ([place])   . Heart disease    Atherosclerotic  . High cholesterol   . Hypertension   . Kidney disease    stage 3  . OSA (  obstructive sleep apnea)   . Stroke [facility]) 2003    His Past Surgical History Is Significant For: Past Surgical History:  Procedure Laterality Date  . CORONARY ARTERY BYPASS GRAFT    . heart ablation  2011  . triple by pass  09/16/1999    His Family History Is Significant For: Family History  Problem Relation Age of Onset  . Stroke Father   . Heart Problems Mother     His Social History Is Significant For: Social History   Socioeconomic History  . Marital status: Married     Spouse name: Aram Beecham  . Number of children: 3  . Years of education: 70  . Highest education level: Not on file  Occupational History    Comment: retired  Scientific laboratory technician  . Financial resource strain: Not on file  . Food insecurity:    Worry: Not on file    Inability: Not on file  . Transportation needs:    Medical: Not on file    Non-medical: Not on file  Tobacco Use  . Smoking status: Former Smoker    Packs/day: 2.50    Years: 50.00    Pack years: 125.00    Types: Cigarettes    Last attempt to quit: 06/15/2011    Years since quitting: 6.8  . Smokeless tobacco: Never Used  Substance and Sexual Activity  . Alcohol use: No    Alcohol/week: 0.0 standard drinks  . Drug use: No  . Sexual activity: Not on file  Lifestyle  . Physical activity:    Days per week: Not on file    Minutes per session: Not on file  . Stress: Not on file  Relationships  . Social connections:    Talks on phone: Not on file    Gets together: Not on file    Attends religious service: Not on file    Active member of club or organization: Not on file    Attends meetings of clubs or organizations: Not on file    Relationship status: Not on file  Other Topics Concern  . Not on file  Social History Narrative   Consumes no caffeine    His Allergies Are:  Allergies  Allergen Reactions  . Codeine Anaphylaxis    Tightness in chest  :   His Current Medications Are:  Outpatient Encounter Medications as of 04/11/2018  Medication Sig  . ACCU-CHEK AVIVA PLUS test strip   . albuterol (PROVENTIL HFA;VENTOLIN HFA) 108 (90 Base) MCG/ACT inhaler Inhale 2 puffs into the lungs every 6 (six) hours as needed for wheezing or shortness of breath.  . allopurinol (ZYLOPRIM) 100 MG tablet Take 2 tablets in the morning and 1 tablet in the evening  . aspirin 81 MG tablet Take 81 mg by mouth daily.  Marland Kitchen b complex vitamins tablet Take 1 tablet by mouth daily.  . Blood Glucose Monitoring Suppl (ONE TOUCH ULTRA 2) W/DEVICE KIT  by Does not apply route.  . Cholecalciferol (VITAMIN D3) 2000 UNITS TABS Take by mouth daily.  . clopidogrel (PLAVIX) 75 MG tablet Take 75 mg by mouth daily.  Marland Kitchen diltiazem (CARDIZEM) 120 MG tablet Take 120 mg by mouth daily.  . Fluticasone-Salmeterol (ADVAIR) 250-50 MCG/DOSE AEPB Inhale 1 puff into the lungs every 12 (twelve) hours.  . folic acid (FOLVITE) 048 MCG tablet Take 400 mcg by mouth daily.  . insulin aspart (NOVOLOG) 100 UNIT/ML injection Inject into the skin daily with breakfast. Up to 20 units  . insulin detemir (LEVEMIR) 100  UNIT/ML injection Inject 80 Units into the skin daily.  Marland Kitchen MELATONIN PO Take 6 mg by mouth daily.  . metoprolol succinate (TOPROL-XL) 50 MG 24 hr tablet Take 50 mg by mouth 2 (two) times daily. Take 1 tablet 2 times daily  . Multiple Vitamins-Minerals (CENTRUM SILVER PO) Take 1 tablet by mouth daily.   . Multiple Vitamins-Minerals (ZINC PO) Take 50 mg by mouth daily.  Marland Kitchen omega-3 acid ethyl esters (LOVAZA) 1 G capsule Take by mouth 2 (two) times daily.  . Potassium Chloride (KLOR-CON 10 PO) Take by mouth daily. Extended release  . Probiotic Product (PROBIOTIC-10 ULTIMATE) CAPS Take 1 capsule by mouth daily.  . rosuvastatin (CRESTOR) 40 MG tablet Take 40 mg by mouth daily.  . SERTRALINE HCL PO Take 150 mg by mouth daily.  Marland Kitchen spironolactone (ALDACTONE) 100 MG tablet Take 100 mg by mouth daily.  . temazepam (RESTORIL) 15 MG capsule Take 15 mg by mouth at bedtime as needed for sleep.  Marland Kitchen torsemide (DEMADEX) 20 MG tablet Take 20 mg by mouth 2 (two) times daily.  Marland Kitchen umeclidinium bromide (INCRUSE ELLIPTA) 62.5 MCG/INH AEPB Inhale 1 puff into the lungs daily.  . vitamin C (ASCORBIC ACID) 500 MG tablet Take 500 mg by mouth daily.  Marland Kitchen warfarin (COUMADIN) 3 MG tablet Take 3 mg by mouth daily. And take 1 mg on Monday, Wednesday and Friday  . [DISCONTINUED] albuterol (PROVENTIL HFA;VENTOLIN HFA) 108 (90 Base) MCG/ACT inhaler Inhale 2 puffs into the lungs every 4 (four) hours as  needed for wheezing or shortness of breath. Inhale one to two puffs by mouth as needed  . [DISCONTINUED] guaiFENesin (MUCINEX) 600 MG 12 hr tablet Take 600 mg by mouth 4 (four) times daily.  . [DISCONTINUED] Melatonin 5 MG TABS Take by mouth daily.  . [DISCONTINUED] NIACIN PO Take 500 mg by mouth 2 (two) times daily. Take 500 mg two times daily  . [DISCONTINUED] rosuvastatin (CRESTOR) 20 MG tablet Take 20 mg by mouth daily.  . [DISCONTINUED] traMADol (ULTRAM) 50 MG tablet Take 1 tablet by mouth three times daily with tylenol as directed   No facility-administered encounter medications on file as of 04/11/2018.   :   Review of Systems:  Out of a complete 14 point review of systems, all are reviewed and negative with the exception of these symptoms as listed below:  Review of Systems  Neurological:       Pt presents today to discuss his tremors. Pt is right handed.    Objective:  Neurological Exam  Physical Exam Physical Examination:   Vitals:   04/11/18 1508  BP: 122/82  Pulse: 68    General Examination: The patient is a very pleasant 72 y.o. male in no acute distress. He appears well-developed and well-nourished and well groomed.   HEENT: Normocephalic, atraumatic, pupils are equal, round and reactive to light and accommodation. Corrective eyeglasses in place. Extraocular tracking is fairly well-preserved. Hearing is grossly intact. He has no lip, neck or jaw tremor.  Chest: Clear to auscultation without wheezing, but distant breathing sounds.   Heart: S1+S2+0, regular and normal without murmurs, rubs or gallops noted.   Abdomen: Soft, non-tender and non-distended with normal bowel sounds appreciated on auscultation.  Extremities: There is 1+ pitting edema in the lower extremities bilaterally, from the knees on down and he is wearing compression stockings to up to the knees bilaterally.   Skin: Warm and dry without trophic changes noted.   Musculoskeletal: exam  reveals no obvious joint  deformities, tenderness or joint swelling or erythema.   Neurologically:  Mental status: The patient is awake, alert and oriented in all 4 spheres. His immediate and remote memory, attention, language skills and fund of knowledge are appropriate. There is no evidence of aphasia, agnosia, apraxia or anomia. Speech is clear with normal prosody and enunciation. Thought process is linear. Mood is normal and affect is normal.  Cranial nerves II - XII are as described above under HEENT exam. In addition: shoulder shrug is normal with equal shoulder height noted.  Motor exam: Normal bulk, strength and tone is noted. There is no drift, or rebound. There is no resting tremor.   On 04/11/2018: Archimedes spiral drawing he has coarse difficulty with the right hand, significant trembling with the left hand, handwriting is legible, not very tremulous, not micrographic. He has a bilateral upper extremity postural and action tremor, mild, slightly more on the left than right. He has no intention tremor. He has no resting tremor.  (08/10/14: There is a bilateral upper extremity postural and action tremor, which is minimal in degree on the right and mild on the left. There tremor frequency is fairly fast and the amplitude is small. On Archimedes spiral drawing there is minimal tremulousness noted with the R hand and mild to moderate shakiness with the L. Handwriting is very clear and legible. There is no evidence of micrographia.)   Romberg is negative, except for mild sway. Reflexes are 1+ throughout. Fine motor skills and coordination: intact with normal finger taps, normal hand movements, normal rapid alternating patting, normal foot taps and normal foot agility.  Cerebellar testing: No dysmetria or intention tremor. There is no truncal or gait ataxia.  Sensory exam: intact to light touch in the upper and lower extremities.  Gait, station and balance: He stands easily. No veering to one side  is noted. No leaning to one side is noted. Posture is age-appropriate and stance is narrow based. Gait shows normal stride length and normal pace. No problems turning are noted. Tandem walk is difficult for him.                 Assessment and Plan:   In summary, URBANO MILHOUSE is a very pleasant 72 year old male with an underlying complex medical history of obesity, kidney disease, arthritis, A. fib, ischemic heart disease, status post CABG, stroke in 4/03 (saw neurologist in [place] ), type 2 diabetes, hyperlipidemia, OSA on CPAP, and hypertension, who presents for reevaluation of his hand tremors. He has a mild bilateral upper extremity tremor, no signs of parkinsonism. I would not recommend any symptomatic treatment for his tremor because of his complex medical history and taking multiple medications at this point. I suggested we proceed with a brain MRI. Given his history of renal impairment I suggested it would only be safe to do a brain MRI without contrast. We will schedule at his convenience and call him with his results. He has a prior history of stroke in 2003 and does have multiple vascular risk factors which we talked about. His wife is worried about his ability to drive. She has noticed that he has some difficulty navigating.  They are advised to talk to about his ability to drive safely. We again talked about potential tremor triggers such as dehydration, low blood sugar values, stress, anxiety, and sleep deprivation. I suggested as needed follow up and be in touch over the phone regarding his MRI results in the interim. I answered all their questions  today and the patient and his wife were in agreement.  Thank you very much for allowing me to participate in the care of this nice patient. If I can be of any further assistance to you please do not hesitate to call me at [phone].  Sincerely,   Star Age, MD, PhD

## 2018-04-11 NOTE — Patient Instructions (Addendum)
You have a tremor of both hands.    I do not see any signs or symptoms of parkinson's like disease or what we call parkinsonism.   For your tremor, I would not recommend any new medication for fear of side effects (especially sleepiness) or medication interactions, especially in light of you taking multiple medications.   We do not have to make a follow up appointment.   Please remember, that any kind of tremor may be exacerbated by anxiety, anger, nervousness, excitement, dehydration, sleep deprivation, by caffeine, and low blood sugar values or blood sugar fluctuations. Some medications can exacerbate tremors, this includes sertraline or other antidepressants.   We will do a brain scan, called MRI and call you with the test results. We will have to schedule you for this on a separate date. This test requires authorization from your insurance, and we will take care of the insurance process. We will call you with the results.   It sounds like your wife is worried about your driving, please have your family monitor it and also talk to your primary care about your ability to drive.

## 2018-04-12 ENCOUNTER — Telehealth: Payer: Self-pay | Admitting: Neurology

## 2018-04-12 NOTE — Telephone Encounter (Signed)
[employer] pending faxed clinical notes °

## 2018-04-13 NOTE — Telephone Encounter (Signed)
I called [employer] and it is still in medical review they did receive my fax of the clinical notes.

## 2018-04-14 NOTE — Telephone Encounter (Signed)
I called [employer] to check the status and they informed me that it was not approved because per their guidelines imaging does not support for movement/tremor disorder. The phone number for the peer to peer is [phone] and the case number is [id number].

## 2018-04-14 NOTE — Telephone Encounter (Signed)
I called pt, spoke to pt's wife, Aram Beecham, per DPR, and advised her of Dr. Guadelupe Sabin recommendations. Pt;s wife verbalized understanding of the recommendations and will continue with close follow up with pt's PCP.

## 2018-04-14 NOTE — Telephone Encounter (Signed)
Please adv pt that his insurance denied his MRI. Since he had a fairly stable exam from before regarding his tremor, no recent stroke like Sx (has a Hx of stroke remotely, in 2003), we can monitor his symptoms. FU as needed.

## 2018-04-19 DIAGNOSIS — L570 Actinic keratosis: Secondary | ICD-10-CM | POA: Diagnosis not present

## 2018-04-19 DIAGNOSIS — L905 Scar conditions and fibrosis of skin: Secondary | ICD-10-CM | POA: Diagnosis not present

## 2018-04-19 DIAGNOSIS — L821 Other seborrheic keratosis: Secondary | ICD-10-CM | POA: Diagnosis not present

## 2018-04-19 DIAGNOSIS — D692 Other nonthrombocytopenic purpura: Secondary | ICD-10-CM | POA: Diagnosis not present

## 2018-04-19 DIAGNOSIS — Z85828 Personal history of other malignant neoplasm of skin: Secondary | ICD-10-CM | POA: Diagnosis not present

## 2018-04-25 DIAGNOSIS — Z7901 Long term (current) use of anticoagulants: Secondary | ICD-10-CM | POA: Diagnosis not present

## 2018-04-25 DIAGNOSIS — I480 Paroxysmal atrial fibrillation: Secondary | ICD-10-CM | POA: Diagnosis not present

## 2018-04-28 DIAGNOSIS — R69 Illness, unspecified: Secondary | ICD-10-CM | POA: Diagnosis not present

## 2018-05-03 DIAGNOSIS — G4733 Obstructive sleep apnea (adult) (pediatric): Secondary | ICD-10-CM | POA: Diagnosis not present

## 2018-05-03 DIAGNOSIS — R69 Illness, unspecified: Secondary | ICD-10-CM | POA: Diagnosis not present

## 2018-05-12 DIAGNOSIS — G4733 Obstructive sleep apnea (adult) (pediatric): Secondary | ICD-10-CM | POA: Diagnosis not present

## 2018-05-23 DIAGNOSIS — I482 Chronic atrial fibrillation, unspecified: Secondary | ICD-10-CM | POA: Diagnosis not present

## 2018-05-23 DIAGNOSIS — Z7901 Long term (current) use of anticoagulants: Secondary | ICD-10-CM | POA: Diagnosis not present

## 2018-05-30 DIAGNOSIS — Z79899 Other long term (current) drug therapy: Secondary | ICD-10-CM | POA: Diagnosis not present

## 2018-05-30 DIAGNOSIS — E785 Hyperlipidemia, unspecified: Secondary | ICD-10-CM | POA: Diagnosis not present

## 2018-05-30 DIAGNOSIS — I480 Paroxysmal atrial fibrillation: Secondary | ICD-10-CM | POA: Diagnosis not present

## 2018-05-30 DIAGNOSIS — E669 Obesity, unspecified: Secondary | ICD-10-CM | POA: Diagnosis not present

## 2018-05-30 DIAGNOSIS — I10 Essential (primary) hypertension: Secondary | ICD-10-CM | POA: Diagnosis not present

## 2018-05-30 DIAGNOSIS — N183 Chronic kidney disease, stage 3 (moderate): Secondary | ICD-10-CM | POA: Diagnosis not present

## 2018-05-30 DIAGNOSIS — E119 Type 2 diabetes mellitus without complications: Secondary | ICD-10-CM | POA: Diagnosis not present

## 2018-06-20 DIAGNOSIS — Z7901 Long term (current) use of anticoagulants: Secondary | ICD-10-CM | POA: Diagnosis not present

## 2018-06-20 DIAGNOSIS — I4821 Permanent atrial fibrillation: Secondary | ICD-10-CM | POA: Diagnosis not present

## 2018-07-28 ENCOUNTER — Telehealth: Payer: Self-pay | Admitting: Emergency Medicine

## 2018-07-28 NOTE — Telephone Encounter (Signed)
Called to confirm our address - he has appt tomorrow.

## 2018-07-29 ENCOUNTER — Encounter: Payer: Self-pay | Admitting: Emergency Medicine

## 2018-07-29 ENCOUNTER — Ambulatory Visit: Payer: Medicare HMO | Admitting: Emergency Medicine

## 2018-07-29 DIAGNOSIS — R05 Cough: Secondary | ICD-10-CM | POA: Diagnosis not present

## 2018-07-29 DIAGNOSIS — J449 Chronic obstructive pulmonary disease, unspecified: Secondary | ICD-10-CM

## 2018-07-29 DIAGNOSIS — I2729 Other secondary pulmonary hypertension: Secondary | ICD-10-CM | POA: Diagnosis not present

## 2018-07-29 DIAGNOSIS — Z9989 Dependence on other enabling machines and devices: Secondary | ICD-10-CM

## 2018-07-29 DIAGNOSIS — R059 Cough, unspecified: Secondary | ICD-10-CM | POA: Insufficient documentation

## 2018-07-29 DIAGNOSIS — G4733 Obstructive sleep apnea (adult) (pediatric): Secondary | ICD-10-CM

## 2018-07-29 DIAGNOSIS — Z789 Other specified health status: Secondary | ICD-10-CM

## 2018-07-29 DIAGNOSIS — Z7289 Other problems related to lifestyle: Secondary | ICD-10-CM | POA: Insufficient documentation

## 2018-07-29 DIAGNOSIS — J4489 Other specified chronic obstructive pulmonary disease: Secondary | ICD-10-CM

## 2018-07-29 MED ORDER — ALBUTEROL SULFATE (2.5 MG/3ML) 0.083% IN NEBU: 2.5000 mg | INHALATION_SOLUTION | Freq: Four times a day (QID) | RESPIRATORY_TRACT | 4 refills | Status: DC | PRN

## 2018-07-29 MED ORDER — TIOTROPIUM BROMIDE-OLODATEROL 2.5-2.5 MCG/ACT IN AERS: 1.0000 | INHALATION_SPRAY | Freq: Every day | RESPIRATORY_TRACT | 0 refills | Status: AC

## 2018-07-29 MED ORDER — IPRATROPIUM-ALBUTEROL 0.5-2.5 (3) MG/3ML IN SOLN: 3.0000 mL | Freq: Four times a day (QID) | RESPIRATORY_TRACT | Status: DC

## 2018-07-29 NOTE — Addendum Note (Signed)
Addended by: Karmen Stabs on: 07/29/2018 12:52 PM   Modules accepted: Orders

## 2018-07-29 NOTE — Assessment & Plan Note (Signed)
Discussed with him today, have recommended cessation.

## 2018-07-29 NOTE — Progress Notes (Signed)
Subjective:    Patient ID: Tommy Riley, male    DOB: [birth year], 73 y.o.   MRN: [id number]  HPI 73 year old former smoker (60 pack years), with an extensive cardiac history that includes coronary artery disease, atrial fibrillation, hypertension diastolic dysfunction, hypercholesterolemia. Also with a history of stroke.  We have followed him here for severe COPD, severe obstruction documented by PFT as well as obstructive sleep apnea for which he is.  He has significant secondary multifactorial pulmonary hypertension in the setting of all the above.  He underwent right heart catheterization 07/14/2017 at [place], RVSP 48 mmHg, PAOP 24, mean pulmonary pressure 34, PVR 2.0 [place].   Currently managed on Incruse, Advair.  He didn't like symbicort due to tremor. Uses albuterol approximately 3-4x a day. This is an increase, experiencing SOB with just walking through the house. Denies any wheeze. He does have a lot of cough, minimally productive because mucous is too thick, no real benefit from mucinex.  He has exertional SOB after short ambulation. Worse over the last few months. He has some stable nasal congestion, clear mucous. He has never needed to be on prednisone before.   He has great CPAP compliance, feels that he benefits significantly. Can't sleep without it. Less daytime sleepiness. He uses nasal mask with chin strap.  He las lost about over 20 lbs over 1.5 yrs.   He still vapes, has done so for yrs. No nicotine in the device, has glycerine.   Review of Systems  Past Medical History:  Diagnosis Date  . Atrial fibrillation ([place])   . Atrial fibrillation ([place])   . COPD (chronic obstructive pulmonary disease) ([place])   . Depression   . Diabetes mellitus without complication ([place])   . Heart disease    Atherosclerotic  . High cholesterol   . Hypertension   . Kidney disease    stage 3  . OSA (obstructive sleep apnea)   . Stroke [facility]) 2003     Family History  Problem Relation Age of  Onset  . Stroke Father   . Heart Problems Mother      Social History   Socioeconomic History  . Marital status: Married    Spouse name: Aram Beecham  . Number of children: 3  . Years of education: 39  . Highest education level: Not on file  Occupational History    Comment: retired  Scientific laboratory technician  . Financial resource strain: Not on file  . Food insecurity:    Worry: Not on file    Inability: Not on file  . Transportation needs:    Medical: Not on file    Non-medical: Not on file  Tobacco Use  . Smoking status: Former Smoker    Packs/day: 2.50    Years: 50.00    Pack years: 125.00    Types: Cigarettes    Last attempt to quit: 06/15/2011    Years since quitting: 7.1  . Smokeless tobacco: Never Used  Substance and Sexual Activity  . Alcohol use: No    Alcohol/week: 0.0 standard drinks  . Drug use: No  . Sexual activity: Not on file  Lifestyle  . Physical activity:    Days per week: Not on file    Minutes per session: Not on file  . Stress: Not on file  Relationships  . Social connections:    Talks on phone: Not on file    Gets together: Not on file    Attends religious service: Not on file  Active member of club or organization: Not on file    Attends meetings of clubs or organizations: Not on file    Relationship status: Not on file  . Intimate partner violence:    Fear of current or ex partner: Not on file    Emotionally abused: Not on file    Physically abused: Not on file    Forced sexual activity: Not on file  Other Topics Concern  . Not on file  Social History Narrative   Consumes no caffeine     Allergies  Allergen Reactions  . Codeine Anaphylaxis    Tightness in chest     Outpatient Medications Prior to Visit  Medication Sig Dispense Refill  . ACCU-CHEK AVIVA PLUS test strip     . albuterol (PROVENTIL HFA;VENTOLIN HFA) 108 (90 Base) MCG/ACT inhaler Inhale 2 puffs into the lungs every 6 (six) hours as needed for wheezing or shortness of breath.      . allopurinol (ZYLOPRIM) 100 MG tablet Take 2 tablets in the morning and 1 tablet in the evening    . aspirin 81 MG tablet Take 81 mg by mouth daily.    Marland Kitchen b complex vitamins tablet Take 1 tablet by mouth daily.    . Blood Glucose Monitoring Suppl (ONE TOUCH ULTRA 2) W/DEVICE KIT by Does not apply route.    . Cholecalciferol (VITAMIN D3) 2000 UNITS TABS Take by mouth daily.    . clopidogrel (PLAVIX) 75 MG tablet Take 75 mg by mouth daily.    Marland Kitchen diltiazem (CARDIZEM) 120 MG tablet Take 120 mg by mouth daily.    . Fluticasone-Salmeterol (ADVAIR) 250-50 MCG/DOSE AEPB Inhale 1 puff into the lungs every 12 (twelve) hours. 438 each 3  . folic acid (FOLVITE) 887 MCG tablet Take 400 mcg by mouth daily.    . insulin aspart (NOVOLOG) 100 UNIT/ML injection Inject into the skin daily with breakfast. Up to 20 units    . insulin detemir (LEVEMIR) 100 UNIT/ML injection Inject 80 Units into the skin daily.    Marland Kitchen MELATONIN PO Take 6 mg by mouth daily.    . metoprolol succinate (TOPROL-XL) 50 MG 24 hr tablet Take 50 mg by mouth 2 (two) times daily. Take 1 tablet 2 times daily    . Multiple Vitamins-Minerals (CENTRUM SILVER PO) Take 1 tablet by mouth daily.     Marland Kitchen omega-3 acid ethyl esters (LOVAZA) 1 G capsule Take by mouth 2 (two) times daily.    . Potassium Chloride (KLOR-CON 10 PO) Take by mouth daily. Extended release    . Probiotic Product (PROBIOTIC-10 ULTIMATE) CAPS Take 1 capsule by mouth daily.    . rosuvastatin (CRESTOR) 40 MG tablet Take 40 mg by mouth daily.    . SERTRALINE HCL PO Take 150 mg by mouth daily.    Marland Kitchen spironolactone (ALDACTONE) 100 MG tablet Take 100 mg by mouth daily.    . temazepam (RESTORIL) 15 MG capsule Take 15 mg by mouth at bedtime as needed for sleep.    Marland Kitchen torsemide (DEMADEX) 20 MG tablet Take 20 mg by mouth 2 (two) times daily.    Marland Kitchen umeclidinium bromide (INCRUSE ELLIPTA) 62.5 MCG/INH AEPB Inhale 1 puff into the lungs daily. 90 each 3  . vitamin C (ASCORBIC ACID) 500 MG tablet Take  500 mg by mouth daily.    Marland Kitchen warfarin (COUMADIN) 3 MG tablet Take 3 mg by mouth daily. And take 1 mg on Monday, Wednesday and Friday    . Multiple Vitamins-Minerals (  ZINC PO) Take 50 mg by mouth daily.     No facility-administered medications prior to visit.         Objective:   Physical Exam Vitals:   07/29/18 1111  BP: 130/72  Pulse: 100  SpO2: 92%  Weight: 259 lb 12.8 oz (117.8 kg)  Height: 5' 11.5" (1.816 m)   Gen: Pleasant, obese man, in no distress,  normal affect  ENT: No lesions,  mouth clear,  oropharynx clear, no postnasal drip  Neck: No JVD, no stridor  Lungs: No use of accessory muscles, distant, small breaths decreased at bases, no crackles or wheezing on normal respiration, he does wheeze on a forced exp.   Cardiovascular: RRR, heart sounds normal, no murmur or gallops, 1+ peripheral edema  Musculoskeletal: No deformities, no cyanosis or clubbing  Neuro: alert, awake, non focal  Skin: Warm, no lesions or rash, healed       Assessment & Plan:  COPD (chronic obstructive pulmonary disease) with chronic bronchitis COPD with progressive symptoms over the last several months.  He has increased cough, mucus burden as well as exertional dyspnea.  Certainly the dyspnea could be multifactorial but I suspect obstructive lung disease is a large component.  I like to do a trial changing his Incruse/Advair to Stiolto to see if he gets more benefit.  He does not have frequent flares so I think we can hold off on the ICS.  Continue albuterol as needed.  He asked about a nebulizer and we can consider giving him nebulized versions if he prefers this.  Consider repeat PFTs going forward.  Walking oximetry today to rule out occult desaturation as a contributor to his dyspnea  25 min of 40 min visit dedicated to discussing various COPD meds, determining new regimen  OSA on CPAP Tolerates his CPAP very well.  No snoring on the device.  Is a nasal mask with a chinstrap, sometimes  has leak typically does well.  Good clinical response and great compliance.  Continue same  Other secondary pulmonary hypertension ([place]) Multifactorial secondary PAH in the setting of left-sided heart disease, COPD, OSA.  No clear indication for targeted [place] therapy.  Need to continue to treat his underlying contributors as aggressively as possible.  Also need to rule out occult exertional hypoxemia which we will do today.  Cough Question contribution of his chronic nasal congestion and drainage.  We will try to initiate treatment with an antihistamine.  He says he has difficulty clearing secretions, has tried Mucinex before.  We will try to add back and see if he benefits.  Finally I will try to eliminate powdered bronchodilators from his regimen see if this helps the cough as well.  Non-nicotine vapor product user Discussed with him today, have recommended cessation.     Baltazar Apo, MD, PhD 07/29/2018, 11:55 AM [facility] [phone] or if no answer [phone]

## 2018-07-29 NOTE — Addendum Note (Signed)
Addended by: Karmen Stabs on: 07/29/2018 01:37 PM   Modules accepted: Orders

## 2018-07-29 NOTE — Addendum Note (Signed)
Addended by: Nena Polio on: 07/29/2018 12:40 PM   Modules accepted: Orders

## 2018-07-29 NOTE — Assessment & Plan Note (Addendum)
COPD with progressive symptoms over the last several months.  He has increased cough, mucus burden as well as exertional dyspnea.  Certainly the dyspnea could be multifactorial but I suspect obstructive lung disease is a large component.  I like to do a trial changing his Incruse/Advair to Stiolto to see if he gets more benefit.  He does not have frequent flares so I think we can hold off on the ICS.  Continue albuterol as needed.  He asked about a nebulizer and we can consider giving him nebulized versions if he prefers this.  Consider repeat PFTs going forward.  Walking oximetry today to rule out occult desaturation as a contributor to his dyspnea  25 min of 40 min visit dedicated to discussing various COPD meds, determining new regimen

## 2018-07-29 NOTE — Patient Instructions (Signed)
Please stop Incruse and Advair for now. We will do a trial of Stiolto 2 puffs once daily.  Keep track of whether this medication helps your breathing.  If so then we will probably continue it going forward. Keep your albuterol available to use 2 puffs up to every 4 hours for shortness of breath, chest tightness, wheezing. We could try giving you an albuterol nebulizer to use if needed.  This would allow you to determine whether the nebulized delivery system works better for you. Walking oximetry on room air today to ensure Please start guaifenesin (Mucinex) 600 mg twice a day until next visit Please start loratadine (Claritin) 10 mg once daily until next visit Continue your CPAP every night as you have been using it. You need to strongly consider stopping vaping.  We will try to help you if possible Follow with Dr Lamonte Sakai in 1 month or next available.

## 2018-07-29 NOTE — Assessment & Plan Note (Signed)
Multifactorial secondary PAH in the setting of left-sided heart disease, COPD, OSA.  No clear indication for targeted [place] therapy.  Need to continue to treat his underlying contributors as aggressively as possible.  Also need to rule out occult exertional hypoxemia which we will do today.

## 2018-07-29 NOTE — Assessment & Plan Note (Signed)
Question contribution of his chronic nasal congestion and drainage.  We will try to initiate treatment with an antihistamine.  He says he has difficulty clearing secretions, has tried Mucinex before.  We will try to add back and see if he benefits.  Finally I will try to eliminate powdered bronchodilators from his regimen see if this helps the cough as well.

## 2018-07-29 NOTE — Assessment & Plan Note (Signed)
Tolerates his CPAP very well.  No snoring on the device.  Is a nasal mask with a chinstrap, sometimes has leak typically does well.  Good clinical response and great compliance.  Continue same

## 2018-08-01 ENCOUNTER — Telehealth: Payer: Self-pay | Admitting: Emergency Medicine

## 2018-08-01 DIAGNOSIS — J449 Chronic obstructive pulmonary disease, unspecified: Secondary | ICD-10-CM

## 2018-08-01 NOTE — Telephone Encounter (Signed)
Spoke with pt. He states that his OV with RB, we ordered oxygen for him. On the order it was to be a POC but the pt was told that they would be bringing him portable tanks. Pt does not want this, he wants a POC. Advised him that we would call [facility] in the morning and figure this out. [facility] can be reached at [phone].

## 2018-08-02 NOTE — Telephone Encounter (Signed)
Spoke with pt. He is aware that [facility] doesn't carry POCs. He would like for his order to be sent to [facility] in [place]. Order has been placed again. Nothing further was needed at this time.

## 2018-08-02 NOTE — Telephone Encounter (Signed)
[employer] and spoke with [employer]. She states the reason the pt was going to get portable tanks is because they do not carry POCs.  LMTCB x1 for pt.

## 2018-08-02 NOTE — Telephone Encounter (Signed)
Patient is returning phone call.  Patient phone number is [phone].

## 2018-08-03 ENCOUNTER — Telehealth: Payer: Self-pay | Admitting: Emergency Medicine

## 2018-08-03 NOTE — Telephone Encounter (Signed)
Patient returned call, CB is [phone].

## 2018-08-03 NOTE — Telephone Encounter (Signed)
I just called [facility] in [place] to confirm that they got the POC order that I had faxed them yesterday. I spoke with Butch Penny who had spoke with Tommy Riley. Butch Penny tried to explain to the patient that they do have POCs but he would also have to have the 02 concentrator at home. The patient didn't want to here that because all he wants is just the POC for all of his 02 needs. No tanks, no concentrator nothing but POC. No DME will just provide a POC they have to have concentrator and tanks at home. The only DME would be [facility] that I know of and he would probably have to buy out of pocket

## 2018-08-03 NOTE — Telephone Encounter (Signed)
Attempted to call Patient.  Left message to call back. 

## 2018-08-03 NOTE — Telephone Encounter (Signed)
Called and spoke with Patient.  He stated that he ordered [employer] and it is suppose to be delivered Friday, 08/05/18.  He stated that he is getting the small, portable tanks from [facility].  Nothing further needed at this time.

## 2018-08-29 ENCOUNTER — Encounter: Payer: Self-pay | Admitting: Emergency Medicine

## 2018-08-29 ENCOUNTER — Ambulatory Visit (INDEPENDENT_AMBULATORY_CARE_PROVIDER_SITE_OTHER): Payer: Medicare HMO | Admitting: Emergency Medicine

## 2018-08-29 DIAGNOSIS — J449 Chronic obstructive pulmonary disease, unspecified: Secondary | ICD-10-CM

## 2018-08-29 DIAGNOSIS — I5022 Chronic systolic (congestive) heart failure: Secondary | ICD-10-CM

## 2018-08-29 DIAGNOSIS — Z9989 Dependence on other enabling machines and devices: Secondary | ICD-10-CM

## 2018-08-29 DIAGNOSIS — J9611 Chronic respiratory failure with hypoxia: Secondary | ICD-10-CM | POA: Insufficient documentation

## 2018-08-29 DIAGNOSIS — G4733 Obstructive sleep apnea (adult) (pediatric): Secondary | ICD-10-CM | POA: Diagnosis not present

## 2018-08-29 MED ORDER — TIOTROPIUM BROMIDE-OLODATEROL 2.5-2.5 MCG/ACT IN AERS: 2.0000 | INHALATION_SPRAY | Freq: Every day | RESPIRATORY_TRACT | 0 refills | Status: DC

## 2018-08-29 NOTE — Assessment & Plan Note (Signed)
Continue your CPAP every night.  Congratulations on your great compliance.

## 2018-08-29 NOTE — Progress Notes (Signed)
Subjective:    Patient ID: Tommy Riley, male    DOB: 1945/12/20, 73 y.o.   MRN: [id number]  HPI 73 year old former smoker (60 pack years), with an extensive cardiac history that includes coronary artery disease, atrial fibrillation, hypertension diastolic dysfunction, hypercholesterolemia. Also with a history of stroke.  We have followed him here for severe COPD, severe obstruction documented by PFT as well as obstructive sleep apnea for which he is.  He has significant secondary multifactorial pulmonary hypertension in the setting of all the above.  He underwent right heart catheterization 07/14/2017 at [place], RVSP 48 mmHg, PAOP 24, mean pulmonary pressure 34, PVR 2.0 [place].   Currently managed on Incruse, Advair.  He didn't like symbicort due to tremor. Uses albuterol approximately 3-4x a day. This is an increase, experiencing SOB with just walking through the house. Denies any wheeze. He does have a lot of cough, minimally productive because mucous is too thick, no real benefit from mucinex.  He has exertional SOB after short ambulation. Worse over the last few months. He has some stable nasal congestion, clear mucous. He has never needed to be on prednisone before.   He has great CPAP compliance, feels that he benefits significantly. Can't sleep without it. Less daytime sleepiness. He uses nasal mask with chin strap.  He las lost about over 20 lbs over 1.5 yrs.   He still vapes, has done so for yrs. No nicotine in the device, has glycerine.   ROV 08/29/2018 --this is a follow-up visit for patient with severe COPD, OSA, multifactorial secondary pulmonary hypertension due to this and also left-sided heart disease as outlined above.  At his last visit I tried changing Incruse/Advair to Stiolto to see if he would get more benefit.  I hope that a non-powdered bronchodilator formulation would also help with some chronic cough and upper airway irritation.  Today he reports.  We also added back Mucinex.  His O2 has been titrated to 4-5L/min. Excellent compliance with his CPAP documented on download today. He has been seen by cards in [place], had a CXR that showed some volume overload, TTE showed a decrease in his LV fxn from 50 > 35%. He was diuresed and started on Entresto, does feel better, has lost 12 lbs fluid. He is unsure whether the Stiolto is any better than his prior regimen, but he would like to stay on it if possible.        Objective:   Physical Exam Vitals:   08/29/18 1128  BP: 118/78  Pulse: 82  SpO2: 96%  Weight: 257 lb 8 oz (116.8 kg)  Height: 5' 11.5" (1.816 m)   Gen: Pleasant, obese man, in no distress,  normal affect  ENT: No lesions,  mouth clear,  oropharynx clear, no postnasal drip  Neck: No JVD, no stridor  Lungs: No use of accessory muscles, distant, small breaths decreased at bases, no crackles or wheezing on normal respiration, he does wheeze on a forced exp.   Cardiovascular: RRR, heart sounds normal, no murmur or gallops, 1+ peripheral edema  Musculoskeletal: No deformities, no cyanosis or clubbing  Neuro: alert, awake, non focal  Skin: Warm, no lesions or rash, healed       Assessment & Plan:  CHF (congestive heart failure) He experienced an exacerbation since our last visit.  Question whether in retrospect this was at least in part responsible for his dyspnea when I saw him.  He has been aggressively diuresed, started on Entresto with benefit.  COPD (chronic obstructive pulmonary disease) with chronic bronchitis Please continue Stiolto 2 puffs once a day for now.  We will obtain a copy of your insurance company's inhaler formulary and look for alternatives.  If there are no good alternatives we may decide to attempt to appeal to keep the [place].  If you run out then restart your Incruse and Advair temporarily until we get this question answered. Please continue Mucinex as you have been taking it. Keep your albuterol available use 2 puffs if  needed for shortness of breath, chest tightness, wheezing.  OSA on CPAP Continue your CPAP every night.  Congratulations on your great compliance.   Chronic respiratory failure with hypoxia (HCC) Continue your oxygen at 4 to 5 L/min with exertion as you have been using it.     Baltazar Apo, MD, PhD 08/29/2018, 5:53 PM [facility] [phone] or if no answer [phone]

## 2018-08-29 NOTE — Patient Instructions (Addendum)
Please continue Stiolto 2 puffs once a day for now.  We will obtain a copy of your insurance company's inhaler formulary and look for alternatives.  If there are no good alternatives we may decide to attempt to appeal to keep the [place].  If you run out then restart your Incruse and Advair temporarily until we get this question answered. Please continue Mucinex as you have been taking it. Keep your albuterol available use 2 puffs if needed for shortness of breath, chest tightness, wheezing. Continue your oxygen at 4 to 5 L/min with exertion as you have been using it. Continue your CPAP every night.  Congratulations on your great compliance. Continue to follow closely with cardiology and continue your medications as advised. Follow with Dr Lamonte Sakai in 3 months or sooner if you have any problems.

## 2018-08-29 NOTE — Assessment & Plan Note (Signed)
Please continue Stiolto 2 puffs once a day for now.  We will obtain a copy of your insurance company's inhaler formulary and look for alternatives.  If there are no good alternatives we may decide to attempt to appeal to keep the [place].  If you run out then restart your Incruse and Advair temporarily until we get this question answered. Please continue Mucinex as you have been taking it. Keep your albuterol available use 2 puffs if needed for shortness of breath, chest tightness, wheezing.

## 2018-08-29 NOTE — Assessment & Plan Note (Signed)
Continue your oxygen at 4 to 5 L/min with exertion as you have been using it.

## 2018-08-29 NOTE — Assessment & Plan Note (Signed)
He experienced an exacerbation since our last visit.  Question whether in retrospect this was at least in part responsible for his dyspnea when I saw him.  He has been aggressively diuresed, started on Entresto with benefit.

## 2018-09-01 ENCOUNTER — Telehealth: Payer: Self-pay | Admitting: Emergency Medicine

## 2018-09-01 NOTE — Telephone Encounter (Addendum)
Pt dropped of his insurance drug formulary. Inhalers that are covered by his insurance are: - Advair - [place] - please advise. Thanks!

## 2018-09-06 ENCOUNTER — Other Ambulatory Visit: Payer: Self-pay | Admitting: General Surgery

## 2018-09-06 MED ORDER — FLUTICASONE-UMECLIDIN-VILANT 100-62.5-25 MCG/INH IN AEPB: 1.0000 | INHALATION_SPRAY | Freq: Every day | RESPIRATORY_TRACT | 3 refills | Status: DC

## 2018-09-06 NOTE — Telephone Encounter (Signed)
Based on review of his formulary, believe that best choice is Trelegy. Please ask him if he would like to make change from [facility] to Trelegy.

## 2018-09-06 NOTE — Progress Notes (Signed)
Called the patient and advised him of Dr. Agustina Caroli response. Patient agreed to try Trelegy. Pharmacy confirmed. Prescription sent. Advised the patient that if he finds the medication is not working to call and let us know.

## 2018-09-06 NOTE — Telephone Encounter (Signed)
Called the patient and made him aware of Dr. Agustina Caroli response. Patient agreed to try Trelegy. Prescription sent to pharmacy. Patient advised that if the medication was not helping him to call the office. Patient voiced understanding, nothing further needed at this time.

## 2018-11-30 ENCOUNTER — Ambulatory Visit: Payer: Medicare HMO | Admitting: Emergency Medicine

## 2019-01-25 ENCOUNTER — Ambulatory Visit: Payer: Medicare HMO | Admitting: Emergency Medicine

## 2019-02-27 ENCOUNTER — Other Ambulatory Visit: Payer: Self-pay | Admitting: Emergency Medicine

## 2019-03-03 ENCOUNTER — Telehealth: Payer: Self-pay | Admitting: Emergency Medicine

## 2019-03-03 MED ORDER — TRELEGY ELLIPTA 100-62.5-25 MCG/INH IN AEPB: 1.0000 | INHALATION_SPRAY | Freq: Every day | RESPIRATORY_TRACT | 3 refills | Status: AC

## 2019-03-03 NOTE — Telephone Encounter (Signed)
Call returned to patient, requesting a printed 90 day supply for his trelegy be mailed to him so he can send to [place] patient assistance program. Prescription printed and mailed. Nothing further needed at this time.

## 2019-07-20 ENCOUNTER — Telehealth: Payer: Self-pay | Admitting: Emergency Medicine

## 2019-07-20 NOTE — Telephone Encounter (Signed)
Called and spoke with pt letting him know that we should schedule a visit and pt verbalized understanding. Pt has been scheduled for a televisit with RB tomorrow. Nothing further needed.

## 2019-07-21 ENCOUNTER — Other Ambulatory Visit: Payer: Self-pay

## 2019-07-21 ENCOUNTER — Encounter: Payer: Self-pay | Admitting: Emergency Medicine

## 2019-07-21 ENCOUNTER — Ambulatory Visit (INDEPENDENT_AMBULATORY_CARE_PROVIDER_SITE_OTHER): Payer: Medicare HMO | Admitting: Emergency Medicine

## 2019-07-21 DIAGNOSIS — J9611 Chronic respiratory failure with hypoxia: Secondary | ICD-10-CM | POA: Diagnosis not present

## 2019-07-21 DIAGNOSIS — G4733 Obstructive sleep apnea (adult) (pediatric): Secondary | ICD-10-CM | POA: Diagnosis not present

## 2019-07-21 DIAGNOSIS — Z9989 Dependence on other enabling machines and devices: Secondary | ICD-10-CM

## 2019-07-21 DIAGNOSIS — J449 Chronic obstructive pulmonary disease, unspecified: Secondary | ICD-10-CM | POA: Diagnosis not present

## 2019-07-21 MED ORDER — ALBUTEROL SULFATE HFA 108 (90 BASE) MCG/ACT IN AERS: 2.0000 | INHALATION_SPRAY | Freq: Four times a day (QID) | RESPIRATORY_TRACT | 6 refills | Status: DC | PRN

## 2019-07-21 MED ORDER — TRELEGY ELLIPTA 100-62.5-25 MCG/INH IN AEPB: 1.0000 | INHALATION_SPRAY | Freq: Every day | RESPIRATORY_TRACT | 6 refills | Status: DC

## 2019-07-21 NOTE — Progress Notes (Signed)
Virtual Visit via Telephone Note  I connected with Tommy Riley on 07/21/19 at  2:15 PM EST by telephone and verified that I am speaking with the correct person using two identifiers.  Location: Patient: Home Provider: Office   I discussed the limitations, risks, security and privacy concerns of performing an evaluation and management service by telephone and the availability of in person appointments. I also discussed with the patient that there may be a patient responsible charge related to this service. The patient expressed understanding and agreed to proceed.   History of Present Illness: 74 year old man with history of tobacco use (60 pack years), still uses vapes.  He has CAD, hypertension, atrial fibrillation with associated diastolic dysfunction.  We follow him for severe COPD, chronic hypoxic respiratory failure, obstructive sleep apnea and multifactorial secondary pulmonary hypertension.    Observations/Objective: CPAP compliance report for the last 30 days shows 97% use for over 4 hours on a setting of 17 cm water.  He has good control of his apneic events with moderate leak of 25 L/min. He believes that he sleeps better without interruptions, less daytime sleepiness. No longer gets sleepy w driving. Feels better and better rested.    We had had been managing him on Stiolto, changed to Trelegy at the beginning of the 2020.  He reports that he is tolerating well. Believes that it helps his breathing a lot. No flares, no pred, no abx.   We had started him on exertional O2, but he has not required since getting his CHF under better control. Can desat to 88% with exertion.   Continues to benefit from [place], diuretics.  Reports that his weight is up some, but not a lot of edema.   Assessment and Plan: COPD (chronic obstructive pulmonary disease) with chronic bronchitis Well compensated at this time.  He is tolerating and benefiting from Trelegy and we will plan to continue.   Minimal albuterol use, keep it available to use if needed.  He would benefit from an should get the coronavirus vaccine when it becomes available.  Chronic respiratory failure with hypoxia (HCC) He was able to come off of exertional oxygen since I have seen him last.  He got significant benefit from initiation of Entresto and adjustment of his diuretics for his congestive heart failure.  His oxygen was discontinued.  He has been seeing some desaturations to 88-89% transiently with exertion.  I think we can continue to follow him off oxygen but I did explain to him that he is borderline.  We may need to restart the oxygen at some point going forward  OSA on CPAP Tolerates well.  Good compliance confirmed on his [employer] today.  He gets good clinical benefit as described above.  Plan to continue same.  Need to send a prescription to his DME so that he can continue to get his equipment as per normal schedule.   Follow Up Instructions: 6 months or prn   I discussed the assessment and treatment plan with the patient. The patient was provided an opportunity to ask questions and all were answered. The patient agreed with the plan and demonstrated an understanding of the instructions.   The patient was advised to call back or seek an in-person evaluation if the symptoms worsen or if the condition fails to improve as anticipated.  I provided 18 minutes of non-face-to-face time during this encounter.   Collene Gobble, MD

## 2019-07-21 NOTE — Assessment & Plan Note (Addendum)
Well compensated at this time.  He is tolerating and benefiting from Trelegy and we will plan to continue.  Minimal albuterol use, keep it available to use if needed.  He would benefit from an should get the coronavirus vaccine when it becomes available.

## 2019-07-21 NOTE — Assessment & Plan Note (Signed)
He was able to come off of exertional oxygen since I have seen him last.  He got significant benefit from initiation of Entresto and adjustment of his diuretics for his congestive heart failure.  His oxygen was discontinued.  He has been seeing some desaturations to 88-89% transiently with exertion.  I think we can continue to follow him off oxygen but I did explain to him that he is borderline.  We may need to restart the oxygen at some point going forward

## 2019-07-21 NOTE — Assessment & Plan Note (Signed)
Tolerates well.  Good compliance confirmed on his [employer] today.  He gets good clinical benefit as described above.  Plan to continue same.  Need to send a prescription to his DME so that he can continue to get his equipment as per normal schedule.

## 2019-09-05 ENCOUNTER — Institutional Professional Consult (permissible substitution): Payer: Medicare HMO | Admitting: Neurology

## 2019-09-19 ENCOUNTER — Ambulatory Visit: Payer: Medicare HMO | Admitting: Neurology

## 2019-09-19 ENCOUNTER — Telehealth: Payer: Self-pay | Admitting: Neurology

## 2019-09-19 ENCOUNTER — Other Ambulatory Visit: Payer: Self-pay

## 2019-09-19 ENCOUNTER — Encounter: Payer: Self-pay | Admitting: Neurology

## 2019-09-19 VITALS — BP 108/82 | HR 84 | Ht 71.5 in | Wt 283.0 lb

## 2019-09-19 DIAGNOSIS — G609 Hereditary and idiopathic neuropathy, unspecified: Secondary | ICD-10-CM | POA: Diagnosis not present

## 2019-09-19 DIAGNOSIS — R251 Tremor, unspecified: Secondary | ICD-10-CM

## 2019-09-19 DIAGNOSIS — R269 Unspecified abnormalities of gait and mobility: Secondary | ICD-10-CM | POA: Diagnosis not present

## 2019-09-19 DIAGNOSIS — G3281 Cerebellar ataxia in diseases classified elsewhere: Secondary | ICD-10-CM

## 2019-09-19 DIAGNOSIS — G5712 Meralgia paresthetica, left lower limb: Secondary | ICD-10-CM

## 2019-09-19 NOTE — Patient Instructions (Signed)
Your tremor is stable.  I believe you have a multifactorial gait disorder, meaning, that it is Tommy Riley is due to a combination of factors. These factors include: normal aging, neuropathy, obesity, suboptimal hydration with water and possible atherosclerosis of the blood vessels in your brain.   Please remember to stand up slowly and get your bearings first turn slowly, no bending down to pick anything, no heavy lifting, be extra careful at night and first thing in the morning. Also, be careful in the Bathroom and the kitchen.  Please start using a cane for safety.  Your left thigh numbness may be from a pinched nerve in the left groin.  Please continue to work on weight loss.  As discussed, we will proceed with an electrical nerve and muscle testing in our office called EMG and nerve conduction velocity test to look for signs of neuropathy and for pinched nerve in the left thigh area.  Remember to drink plenty of fluid, eat healthy meals and do not skip any meals. Try to eat protein with a every meal and eat a healthy snack such as fruit or nuts or yogurt in between meals. Try to keep a regular sleep-wake schedule and try to exercise daily, particularly in the form of walking, 20-30 minutes a day, if you can. You should use a cane.   As far as your medications are concerned, I would like to suggest no new medications.   As far as diagnostic testing: EMG/NCV test and I also suggest we proceed with a brain scan, called MRI. We will call you with the test results. We will have to schedule you for this on a separate date. This test requires authorization from your insurance, and we will take care of the insurance process.

## 2019-09-19 NOTE — Telephone Encounter (Signed)
[employer] medicare order sent to [facility]. They will obtain the auth and reach out to the patient to schedule.  °

## 2019-09-19 NOTE — Progress Notes (Signed)
Subjective:    Patient ID: Tommy Riley is a 74 y.o. male.  HPI     Interim history:   Dear Dr. Harmon Pier,    I saw your patient, Tommy Riley, upon your kind request, in my neurologic clinic today for evaluation of his gait disorder. The patient is accompanied by his wife today. I have previously evaluated him for hand tremors. As you know, Tommy Riley is a 74 year old right-handed gentleman with an underlying complex medical history of hypertension, hyperlipidemia, heart disease, COPD, stroke, A. fib, OSA on CPAP, DM on insulin, and obesity, who problems with his balance and feeling weaker in his legs when standing.  He feels that his tremor comes and goes, it has at times affected his left leg or both feet.  He has noticed aching in both legs and has had numbness in the lateral thigh area on the left.  He has gained some weight in the recent past.  He reports using his CPAP faithfully.  His latest A1c by his report it was either 6.2 or 6.4.  Is followed by a cardiologist in [place].  He had a carotid Doppler study about a year ago.  He has chronic swelling in both lower extremities.  He uses compression socks.  He is also followed by an nephrologist.  He tries to hydrate well with water.  He reports that his cardiologist feels he drinks too much water and his nephrologist feels he does not drink enough water.  He drinks alcohol rarely, he drinks caffeine in the form of green tea with honey.  Previously:  75 year old right-handed gentleman with an underlying complex medical history of hypertension, hyperlipidemia, heart disease, COPD, stroke, sleep apnea, history of A. fib, and obesity, who reports a several year Hx of b/l hand tremors, worse on the L. He has noticed some progression over time. He has no telltale family history of tremors but his older brother recently reported a hand tremor to him. His wife has some concerns about his driving, she has noticed that he has had some erratic  driving recently.  He has had some exacerbation of his depression recently. He had been off the sertraline and restarted it which improved his depression.   I reviewed your office note from 02/17/2018, which you kindly included. He had blood work through your office on 31 2000 1990 reviewed the results: Liver function was unremarkable, hemoglobin A1c was 6.8, BMP showed BUN of 31, creatinine 1.35. Lipid panel showed total cholesterol of 138, LDL 71, triglycerides 199.   08/10/2014: 74 year old right-handed gentleman with an underlying complex medical history of obesity, kidney disease, arthritis, A. fib, ischemic heart disease, status post CABG, stroke in 4/03 (saw neurologist in [place] ), type 2 diabetes, hyperlipidemia, OSA on CPAP, and hypertension, who reports intermittent bilateral upper extremity tremors for the past few years, more so in the last year, L more than R. this is bothersome to him at times when he tries to do fine motor tasks. He has had trouble with shaving and with feeding himself. He has an occasional rest tremor. He does not have a family history of tremors. He does not have a history of head injury. He does have low back pain. He does not have a family history of Parkinson's disease. Tremor is every day, but mostly mild and certainly intermittent. He quit smoking on 06/17/2011. He quit smoking after his open-heart surgery but restarted it after he started working again.  He sees a cardiologist, he sees a  pulmonologist in [place], [place]. He also has a nephrologist. He had an eye exam a year ago. He is due for his yearly physical with you next month. He's never had any thyroid dysfunction as far as he knows. He has been on sertraline 150 mg for quite some time. He has been on sertraline for years. His wife wonders if it is effective at this time. She feels that he is quite irritable at times. He does not report much in the way of depressive symptoms and feels his mood is fair. He  has been on Symbicort, and Spiriva and as needed pro-air.  His Past Medical History Is Significant For: Past Medical History:  Diagnosis Date  . Atrial fibrillation ([place])   . Atrial fibrillation ([place])   . COPD (chronic obstructive pulmonary disease) ([place])   . Depression   . Diabetes mellitus without complication ([place])   . Heart disease    Atherosclerotic  . High cholesterol   . Hypertension   . Kidney disease    stage 3  . OSA (obstructive sleep apnea)   . Stroke [facility]) 2003    His Past Surgical History Is Significant For: Past Surgical History:  Procedure Laterality Date  . CORONARY ARTERY BYPASS GRAFT    . heart ablation  2011  . triple by pass  09/16/1999    His Family History Is Significant For: Family History  Problem Relation Age of Onset  . Heart Problems Mother   . Stroke Father     His Social History Is Significant For: Social History   Socioeconomic History  . Marital status: Married    Spouse name: Aram Beecham  . Number of children: 3  . Years of education: 51  . Highest education level: Not on file  Occupational History    Comment: retired  Tobacco Use  . Smoking status: Former Smoker    Packs/day: 2.50    Years: 50.00    Pack years: 125.00    Types: Cigarettes    Quit date: 06/15/2011    Years since quitting: 8.2  . Smokeless tobacco: Never Used  Substance and Sexual Activity  . Alcohol use: No    Alcohol/week: 0.0 standard drinks  . Drug use: No  . Sexual activity: Not on file  Other Topics Concern  . Not on file  Social History Narrative   Consumes no caffeine   Social Determinants of Health   Financial Resource Strain:   . Difficulty of Paying Living Expenses:   Food Insecurity:   . Worried About Charity fundraiser in the Last Year:   . Arboriculturist in the Last Year:   Transportation Needs:   . Film/video editor (Medical):   Marland Kitchen Lack of Transportation (Non-Medical):   Physical Activity:   . Days of Exercise per Week:   .  Minutes of Exercise per Session:   Stress:   . Feeling of Stress :   Social Connections:   . Frequency of Communication with Friends and Family:   . Frequency of Social Gatherings with Friends and Family:   . Attends Religious Services:   . Active Member of Clubs or Organizations:   . Attends Archivist Meetings:   Marland Kitchen Marital Status:     His Allergies Are:  Allergies  Allergen Reactions  . Codeine Anaphylaxis    Tightness in chest  :   His Current Medications Are:  Outpatient Encounter Medications as of 09/19/2019  Medication Sig  . ACCU-CHEK AVIVA PLUS  test strip   . albuterol (PROVENTIL) (2.5 MG/3ML) 0.083% nebulizer solution Take 3 mLs (2.5 mg total) by nebulization every 6 (six) hours as needed for wheezing or shortness of breath.  Marland Kitchen albuterol (VENTOLIN HFA) 108 (90 Base) MCG/ACT inhaler Inhale 2 puffs into the lungs every 6 (six) hours as needed for wheezing or shortness of breath.  . allopurinol (ZYLOPRIM) 100 MG tablet Take 2 tablets in the morning and 1 tablet in the evening  . aspirin 81 MG tablet Take 81 mg by mouth daily.  Marland Kitchen b complex vitamins tablet Take 1 tablet by mouth daily.  . Blood Glucose Monitoring Suppl (ONE TOUCH ULTRA 2) W/DEVICE KIT by Does not apply route.  . Cholecalciferol (VITAMIN D3) 2000 UNITS TABS Take by mouth daily.  . Fluticasone-Umeclidin-Vilant (TRELEGY ELLIPTA) 100-62.5-25 MCG/INH AEPB Inhale 1 puff into the lungs daily.  . folic acid (FOLVITE) 403 MCG tablet Take 400 mcg by mouth daily.  . insulin aspart (NOVOLOG) 100 UNIT/ML injection Inject into the skin daily with breakfast. Up to 20 units  . insulin detemir (LEVEMIR) 100 UNIT/ML injection Inject 80 Units into the skin daily.  Marland Kitchen MELATONIN PO Take 6 mg by mouth daily.  . metoprolol succinate (TOPROL-XL) 50 MG 24 hr tablet Take 50 mg by mouth 2 (two) times daily. Take 1 tablet 2 times daily  . Multiple Vitamins-Minerals (CENTRUM SILVER PO) Take 1 tablet by mouth daily.   Marland Kitchen omega-3  acid ethyl esters (LOVAZA) 1 G capsule Take by mouth 2 (two) times daily.  . Potassium Chloride (KLOR-CON 10 PO) Take 20 mg by mouth daily. Extended release   . rosuvastatin (CRESTOR) 40 MG tablet Take 40 mg by mouth daily.  . sacubitril-valsartan (ENTRESTO) 24-26 MG Take 1 tablet by mouth 2 (two) times daily.  . SERTRALINE HCL PO Take 150 mg by mouth daily.  Marland Kitchen spironolactone (ALDACTONE) 100 MG tablet Take 100 mg by mouth daily.  Marland Kitchen torsemide (DEMADEX) 20 MG tablet Take 20 mg by mouth 2 (two) times daily.  . vitamin C (ASCORBIC ACID) 500 MG tablet Take 500 mg by mouth daily.  Marland Kitchen warfarin (COUMADIN) 3 MG tablet Take 3 mg by mouth daily. And take 1 mg on Monday, Wednesday and Friday   No facility-administered encounter medications on file as of 09/19/2019.  :  Review of Systems:  Out of a complete 14 point review of systems, all are reviewed and negative with the exception of these symptoms as listed below: Review of Systems  Neurological:       Pt presents today to discuss his worsening tremors. His tremors have extended to his left leg. He is also complaining of numbness in his left thigh. He is also complaining of weakness in his legs.    Objective:  Neurological Exam  Physical Exam Physical Examination:   Vitals:   09/19/19 1033  BP: 108/82  Pulse: 84    General Examination: The patient is a very pleasant 74 y.o. male in no acute distress. He appears well-developed and well-nourished and well groomed.   HEENT:Normocephalic, atraumatic, pupils are equal, round and reactive to light and accommodation. Corrective eyeglasses in place. Extraocular tracking is fairly well-preserved. Hearing is grossly intact. He has no lip, neck or jaw tremor.  Chest:Clear to auscultation without wheezing, but distant breathing sounds.   Heart:S1+S2+0, irreg. irregular and normal without murmurs, rubs or gallops noted.   Abdomen:Soft, non-tender and non-distended with normal bowel sounds  appreciated on auscultation.  Extremities:There is 1+ pitting edema in  the lower extremities bilaterally, from the knees on down and he is wearing compression stockings to up to the knees bilaterally.  Chronic appearing discoloration in both lower extremities, right more than left, surgical changes in the right distal extremity.  Skin: Warm and dry without trophic changes noted.   Musculoskeletal: exam reveals no obvious joint deformities, tenderness or joint swelling or erythema.   Neurologically:  Mental status: The patient is awake, alert and oriented in all 4 spheres. His immediate and remote memory, attention, language skills and fund of knowledge are appropriate. There is no evidence of aphasia, agnosia, apraxia or anomia. Speech is clear with normal prosody and enunciation. Thought process is linear. Mood is normaland affect is normal.  Cranial nerves II - XII are as described above under HEENT exam. In addition: shoulder shrug is normal with equal shoulder height noted.  Motor exam: Normal bulk, strength and tone is noted. There is no drift, or rebound. There is no resting tremor. He has a very slight postural tremor in the L hand, no resting or action tremor, no tremor elsewhere.   (On 04/11/2018: Archimedes spiral drawing he has coarse difficulty with the right hand, significant trembling with the left hand, handwriting is legible, not very tremulous, not micrographic. He has a bilateral upper extremity postural and action tremor, mild, slightly more on the left than right. He has no intention tremor. He has no resting tremor.)  (08/10/14: There is a bilateral upper extremity postural and action tremor, which is minimal in degree on the right and mild on the left. There tremor frequency is fairly fast and the amplitude is small. On Archimedes spiral drawing there is minimal tremulousness noted with the R hand and mild to moderate shakiness with the L. Handwriting is very clear and  legible. There is noevidence of micrographia.)   Romberg is negative, except for mild sway. Reflexes are 1+ throughout. Fine motor skills and coordination: intact with normal finger taps, normal hand movements, normal rapid alternating patting, normal foot taps and normal foot agility.  Cerebellar testing: No dysmetria or intention tremor. There is no truncal or gait ataxia.  Sensory exam: intact to light touch, temp and vibration in the upper and lower extremities distally, but decrease in PP in the distal LEs.  Gait, station and balance: He stands easily. No veering to one side is noted. No leaning to one side is noted. Posture is age-appropriate and stance is slightly wider based. Gait shows slightly cautious gait, preserved arm swing.   Assessment and Plan:   In summary, BRYLEE MCGREAL a very pleasant 74 year old male with an underlying complex medical history of obesity, kidney disease, arthritis, A. fib, ischemic heart disease, status post CABG (followed by a cardiologist in [place]), stroke in 4/03 (saw a neurologist in [place]), type 2 diabetes, hyperlipidemia, OSA on CPAP, and hypertension, who presents for evaluation of his gait disorder. On exam, he has no signs of parkinsonism and no exacerbation of his tremor. He has a non-specific gait d/o and gait insecurity. His symptoms are also supportive of L sided meralgia paresthetica. He may have mild peripheral neuropathy, with DM being the likely cause. I suggested we proceed with a EMG and NCV for possible neuropathy and L sided meralgia paresthetica. He is encouraged to work on weight loss and start using a cane for safety. He has a prior history of stroke in 2003 and does have multiple vascular risk factors which we talked about again today. I did not suggest  any new medication. I suggested we do a brain MRI wo contrast. We will call him with the test results and follow up if needed. I answered all his questions today and the patient  was in agreement. I spent 40 minutes in total face-to-face time and in reviewing records during pre-charting, more than 50% of which was spent in counseling and coordination of care, reviewing test results, reviewing medications and treatment regimen and/or in discussing or reviewing the diagnosis of gait d/o, neuropathy, the prognosis and treatment options. Pertinent laboratory and imaging test results that were available during this visit with the patient were reviewed by me and considered in my medical decision making (see chart for details).

## 2019-09-28 ENCOUNTER — Other Ambulatory Visit: Payer: Self-pay

## 2019-09-28 ENCOUNTER — Ambulatory Visit: Payer: Medicare HMO | Admitting: Diagnostic Neuroimaging

## 2019-09-28 ENCOUNTER — Encounter (INDEPENDENT_AMBULATORY_CARE_PROVIDER_SITE_OTHER): Payer: Medicare HMO | Admitting: Diagnostic Neuroimaging

## 2019-09-28 DIAGNOSIS — G609 Hereditary and idiopathic neuropathy, unspecified: Secondary | ICD-10-CM

## 2019-09-28 DIAGNOSIS — Z0289 Encounter for other administrative examinations: Secondary | ICD-10-CM

## 2019-09-28 DIAGNOSIS — R251 Tremor, unspecified: Secondary | ICD-10-CM

## 2019-09-28 DIAGNOSIS — G5712 Meralgia paresthetica, left lower limb: Secondary | ICD-10-CM

## 2019-09-28 DIAGNOSIS — R269 Unspecified abnormalities of gait and mobility: Secondary | ICD-10-CM

## 2019-10-02 ENCOUNTER — Telehealth: Payer: Self-pay

## 2019-10-02 NOTE — Telephone Encounter (Signed)
-----   Message from Star Age, MD sent at 10/02/2019  3:57 PM EDT ----- Please call and advise the patient that the recent EMG and nerve conduction velocity test, which is the electrical nerve and muscle test we we performed, showed evidence of neuropathy ie nerve damage. The most likely cause is his diabetes. We will await the brain MRI for now and we could consider additional blood work to look for treatable causes of neuropathy.  We will call him with his brain MRI results and take it from there.   Star Age, MD, PhD

## 2019-10-02 NOTE — Procedures (Signed)
[facility]  NCS (NERVE CONDUCTION STUDY) WITH EMG (ELECTROMYOGRAPHY) REPORT   STUDY DATE: 09/28/19 PATIENT NAME: Tommy Riley DOB: 04-Jul-1946 MRN: [id number]  ORDERING CLINICIAN: Star Age, MD PhD   TECHNOLOGIST: Sherre Scarlet ELECTROMYOGRAPHER: Earlean Polka. Marston Mccadden, MD  CLINICAL INFORMATION: 74 year old male with unsteady gait and left thigh numbness.  FINDINGS: NERVE CONDUCTION STUDY: Bilateral peroneal motor responses have normal amplitudes and slow conduction velocities.  Bilateral tibial motor responses have normal distal latencies, decreased amplitudes with proximal stimulation, slow conduction velocities.  Left sural sensory response has prolonged peak latency and normal amplitude.  Right sural sensory spots has normal distal latency and decreased amplitude.  Left superficial peroneal sensory response has prolonged peak latency and normal amplitude.  Right superficial peroneal sensory response could not be obtained.  Bilateral tibial F wave latencies are prolonged.   NEEDLE ELECTROMYOGRAPHY:  Needle examination of left lower extremity vastus medialis, tibialis anterior, gastrocnemius is normal except for 1+ positive sharp waves in left gastrocnemius at rest.  Normal motor recruitment on exertion.   IMPRESSION:   Abnormal study demonstrating: - Widespread, axonal sensorimotor polyneuropathy. - Partial conduction block of bilateral tibial motor responses with proximal stimulation at the popliteal fossa, may be due to technical artifact and body habitus.   INTERPRETING PHYSICIAN:  Penni Bombard, MD Certified in Neurology, Neurophysiology and Neuroimaging  [facility] [address] [place], [place] [address] [phone]   [facility]    Nerve / Sites Muscle Latency Ref. Amplitude Ref. Rel Amp Segments Distance Velocity Ref. Area    ms ms mV mV %  cm m/s m/s mVms  L Peroneal - EDB     Ankle EDB 5.1 ?6.5 4.5  ?2.0 100 Ankle - EDB 9   16.2     Fib head EDB 12.7  3.7  82.7 Fib head - Ankle 30 39 ?44 16.5     Pop fossa EDB 15.2  3.4  91.4 Pop fossa - Fib head 10 40 ?44 16.3         Pop fossa - Ankle      R Peroneal - EDB     Ankle EDB 5.1 ?6.5 6.0 ?2.0 100 Ankle - EDB 9   20.4     Fib head EDB 12.1  5.4  91.3 Fib head - Ankle 29 41 ?44 23.7     Pop fossa EDB 14.5  5.6  103 Pop fossa - Fib head 10 42 ?44 20.4         Pop fossa - Ankle      L Tibial - AH     Ankle AH 4.4 ?5.8 9.3 ?4.0 100 Ankle - AH 9   24.0     Pop fossa AH 16.6  2.1  22.7 Pop fossa - Ankle 39 32 ?41 10.3  R Tibial - AH     Ankle AH 4.5 ?5.8 9.3 ?4.0 100 Ankle - AH 9   23.3     Pop fossa AH 15.1  0.8  8.95 Pop fossa - Ankle 38 36 ?41 4.6             SNC    Nerve / Sites Rec. Site Peak Lat Ref.  Amp Ref. Segments Distance    ms ms V V  cm  L Sural - Ankle (Calf)     Calf Ankle 5.4 ?4.4 7 ?6 Calf - Ankle 14  R Sural - Ankle (Calf)     Calf Ankle 4.3 ?  4.4 3 ?6 Calf - Ankle 14  L Superficial peroneal - Ankle     Lat leg Ankle 5.8 ?4.4 7 ?6 Lat leg - Ankle 14  R Superficial peroneal - Ankle     Lat leg Ankle NR ?4.4 NR ?6 Lat leg - Ankle 14             F  Wave    Nerve F Lat Ref.   ms ms  L Tibial - AH 63.0 ?56.0  R Tibial - AH 61.9 ?56.0         EMG Summary Table    Spontaneous MUAP Recruitment  Muscle IA Fib PSW Fasc Other Amp Dur. Poly Pattern  L. Vastus medialis Normal None None None _______ Normal Normal Normal Normal  L. Tibialis anterior Normal None None None _______ Normal Normal Normal Normal  L. Gastrocnemius (Medial head) Increased None 1+ None _______ Normal Normal Normal Normal

## 2019-10-02 NOTE — Telephone Encounter (Signed)
I called pt, spoke to pt's wife, Aram Beecham, per DPR, and advised her of pt's results and recommendations. Pt's wife verbalized understanding of results. Pt's wife had no questions at this time but was encouraged to call back if questions arise.

## 2019-10-02 NOTE — Progress Notes (Signed)
Please call and advise the patient that the recent EMG and nerve conduction velocity test, which is the electrical nerve and muscle test we we performed, showed evidence of neuropathy ie nerve damage. The most likely cause is his diabetes. We will await the brain MRI for now and we could consider additional blood work to look for treatable causes of neuropathy.  We will call him with his brain MRI results and take it from there.   Star Age, MD, PhD

## 2019-10-09 ENCOUNTER — Telehealth: Payer: Self-pay | Admitting: Emergency Medicine

## 2019-10-09 DIAGNOSIS — G4733 Obstructive sleep apnea (adult) (pediatric): Secondary | ICD-10-CM

## 2019-10-09 NOTE — Telephone Encounter (Signed)
ATC pt, no answer. Left message for pt to call back.  

## 2019-10-10 NOTE — Telephone Encounter (Signed)
I have left a detailed message with the pt making him aware that we will send an order to [facility] for his CPAP supplies. Order has been placed. Nothing further needed.

## 2019-10-10 NOTE — Telephone Encounter (Signed)
lmtcb for pt.  

## 2019-10-10 NOTE — Telephone Encounter (Signed)
Pt calling back missed call

## 2019-10-16 NOTE — Telephone Encounter (Signed)
[employer] medicare Josem [id number] (exp. 10/13/19 to 04/10/20) patient is scheduled at GI for 10/18/19.

## 2019-10-18 ENCOUNTER — Ambulatory Visit
Admission: RE | Admit: 2019-10-18 | Discharge: 2019-10-18 | Disposition: A | Discharge status: Home / Self Care | Payer: Medicare HMO | Source: Ambulatory Visit | Attending: Neurology | Admitting: Neurology

## 2019-10-18 ENCOUNTER — Other Ambulatory Visit: Payer: Self-pay

## 2019-10-18 DIAGNOSIS — G609 Hereditary and idiopathic neuropathy, unspecified: Secondary | ICD-10-CM

## 2019-10-18 DIAGNOSIS — R269 Unspecified abnormalities of gait and mobility: Secondary | ICD-10-CM

## 2019-10-18 DIAGNOSIS — R251 Tremor, unspecified: Secondary | ICD-10-CM

## 2019-10-19 ENCOUNTER — Telehealth: Payer: Self-pay

## 2019-10-19 NOTE — Telephone Encounter (Signed)
I called pt. I discussed his MRI results. Pt verbalized understanding of results. Pt had no questions at this time but was encouraged to call back if questions arise.

## 2019-10-19 NOTE — Telephone Encounter (Signed)
-----   Message from Star Age, MD sent at 10/19/2019  5:02 PM EDT ----- MRI brain showed no acute findings and showed an old stroke, which is not a new finding. Please update patient.  Michel Bickers

## 2019-10-19 NOTE — Progress Notes (Signed)
MRI brain showed no acute findings and showed an old stroke, which is not a new finding. Please update patient.  Tommy Riley

## 2019-12-05 ENCOUNTER — Encounter: Payer: Self-pay | Admitting: Pulmonary Disease

## 2019-12-05 ENCOUNTER — Ambulatory Visit (INDEPENDENT_AMBULATORY_CARE_PROVIDER_SITE_OTHER): Payer: Medicare HMO | Admitting: Pulmonary Disease

## 2019-12-05 ENCOUNTER — Other Ambulatory Visit: Payer: Self-pay

## 2019-12-05 DIAGNOSIS — I5022 Chronic systolic (congestive) heart failure: Secondary | ICD-10-CM | POA: Diagnosis not present

## 2019-12-05 DIAGNOSIS — G4733 Obstructive sleep apnea (adult) (pediatric): Secondary | ICD-10-CM

## 2019-12-05 DIAGNOSIS — J449 Chronic obstructive pulmonary disease, unspecified: Secondary | ICD-10-CM | POA: Diagnosis not present

## 2019-12-05 DIAGNOSIS — Z9989 Dependence on other enabling machines and devices: Secondary | ICD-10-CM

## 2019-12-05 NOTE — Assessment & Plan Note (Signed)
No formal CPAP compliance to review today Patient reporting 100% compliance Patient reporting no issues with using his CPAP Patient reports that he even has a travel CPAP that he uses  Plan: Continue CPAP therapy We will request a download from [facility]

## 2019-12-05 NOTE — Progress Notes (Signed)
Virtual Visit via Telephone Note  I connected with Tommy Riley on 12/05/19 at  1:30 PM EDT by telephone and verified that I am speaking with the correct person using two identifiers.  Location: Patient: Home Provider: Office [facility] - [id number], [place], [place], [place] [address]   I discussed the limitations, risks, security and privacy concerns of performing an evaluation and management service by telephone and the availability of in person appointments. I also discussed with the patient that there may be a patient responsible charge related to this service. The patient expressed understanding and agreed to proceed.  Patient consented to consult via telephone: Yes People present and their role in pt care: Pt     History of Present Illness:  74 year old male former smoker followed in our office for obstructive sleep apnea and COPD:  Past medical history: A. fib, hypertension, CHF, obesity, type 2 diabetes Smoking history: Former smoker, quit 2012, 125-pack-year smoking history Maintenance: Patient of Dr. Lamonte Sakai  Chief complaint: 1 year follow-up  74 year old male former smoker followed in our office for obstructive sleep apnea and COPD. Patient was last seen in our office in January/2021 this was a virtual visit. Patient was requested to remain on Trelegy Ellipta for management of his COPD. He was able to discontinue his chronic oxygen use after starting Entresto. He was encouraged to follow-up in 6 months or as needed. He was encouraged to remain on his CPAP.  Patient reports he has been doing well since last being seen virtually by Dr. Lamonte Sakai in January/2021.  He reports adherence to Trelegy Ellipta.  He reports that he uses his CPAP every night.  He even has a travel CPAP that he sometimes uses.  His DME company is now [employer].  We will ensure that we are requesting a download from [facility].  He reports that his breathing is stable.  Oxygen saturations  are around 90% on room air with physical exertion around 95% at rest.  He has no acute complaints or issues today.  Observations/Objective:  Social History   Tobacco Use  Smoking Status Former Smoker   Packs/day: 2.50   Years: 50.00   Pack years: 125.00   Types: Cigarettes   Quit date: 06/15/2011   Years since quitting: 8.4  Smokeless Tobacco Never Used   Immunization History  Administered Date(s) Administered   Influenza, High Dose Seasonal PF 04/30/2016, 04/21/2017   Influenza,inj,Quad PF,6+ Mos 05/08/2015   Influenza,inj,Quad PF,6-35 Mos 05/07/2019   Influenza-Unspecified 04/14/2014, 05/29/2018   Pneumococcal Conjugate-13 04/14/2014   Tdap 03/15/2017   Zoster Recombinat (Shingrix) 03/15/2017, 03/29/2017      Assessment and Plan:  CHF (congestive heart failure) Plan: Keep follow-up with cardiology Continue Entresto   OSA on CPAP No formal CPAP compliance to review today Patient reporting 100% compliance Patient reporting no issues with using his CPAP Patient reports that he even has a travel CPAP that he uses  Plan: Continue CPAP therapy We will request a download from [facility]  COPD (chronic obstructive pulmonary disease) with chronic bronchitis Plan: Continue Trelegy Ellipta 56-month follow-up with our office   Follow Up Instructions:  Return in about 6 months (around 06/05/2020), or if symptoms worsen or fail to improve, for Follow up with Dr. Lamonte Sakai.   I discussed the assessment and treatment plan with the patient. The patient was provided an opportunity to ask questions and all were answered. The patient agreed with the plan and demonstrated an understanding of the instructions.  The patient was advised to call back or seek an in-person evaluation if the symptoms worsen or if the condition fails to improve as anticipated.  I provided 22 minutes of non-face-to-face time during this encounter.   [name] Rinne, NP

## 2019-12-05 NOTE — Assessment & Plan Note (Signed)
Plan: Continue Trelegy Ellipta 90-month follow-up with our office

## 2019-12-05 NOTE — Assessment & Plan Note (Signed)
Plan: Keep follow-up with cardiology Continue Entresto 

## 2019-12-05 NOTE — Patient Instructions (Signed)
You were seen today by [name] Rinne, NP  for:   1. Chronic systolic congestive heart failure ([place])  Continue follow-up with cardiology  Continue Entresto  2. OSA on CPAP  We recommend that you continue using your CPAP daily >>>Keep up the hard work using your device >>> Goal should be wearing this for the entire night that you are sleeping, at least 4 to 6 hours  Remember:  . Do not drive or operate heavy machinery if tired or drowsy.  . Please notify the supply company and office if you are unable to use your device regularly due to missing supplies or machine being broken.  . Work on maintaining a healthy weight and following your recommended nutrition plan  . Maintain proper daily exercise and movement  . Maintaining proper use of your device can also help improve management of other chronic illnesses such as: Blood pressure, blood sugars, and weight management.   BiPAP/ CPAP Cleaning:  >>>Clean weekly, with Dawn soap, and bottle brush.  Set up to air dry. >>> Wipe mask out daily with wet wipe or towelette   3. COPD (chronic obstructive pulmonary disease) with chronic bronchitis (HCC)  Trelegy Ellipta  >>> 1 puff daily in the morning >>>rinse mouth out after use  >>> This inhaler contains 3 medications that help manage her respiratory status, contact our office if you cannot afford this medication or unable to remain on this medication  Note your daily symptoms > remember "red flags" for COPD:   >>>Increase in cough >>>increase in sputum production >>>increase in shortness of breath or activity  intolerance.   If you notice these symptoms, please call the office to be seen.    Follow Up:    No follow-ups on file.   Please do your part to reduce the spread of COVID-19:      Reduce your risk of any infection  and COVID19 by using the similar precautions used for avoiding the common cold or flu:  Marland Kitchen Wash your hands often with soap and warm water for at least 20  seconds.  If soap and water are not readily available, use an alcohol-based hand sanitizer with at least 60% alcohol.  . If coughing or sneezing, cover your mouth and nose by coughing or sneezing into the elbow areas of your shirt or coat, into a tissue or into your sleeve (not your hands). Langley Gauss A MASK when in public  . Avoid shaking hands with others and consider head nods or verbal greetings only. . Avoid touching your eyes, nose, or mouth with unwashed hands.  . Avoid close contact with people who are sick. . Avoid places or events with large numbers of people in one location, like concerts or sporting events. . If you have some symptoms but not all symptoms, continue to monitor at home and seek medical attention if your symptoms worsen. . If you are having a medical emergency, call 911.   [place] / e-Visit: [email or web address]         [facility]: [facility]: [id number]                   [facility]: [id number]     It is flu season:   >>> Best ways to protect herself from the flu: Receive the yearly flu vaccine, practice good hand hygiene washing with soap and also using hand sanitizer when available, eat  a nutritious meals, get adequate rest, hydrate appropriately   Please contact the office if your symptoms worsen or you have concerns that you are not improving.   Thank you for choosing [facility] for your healthcare, and for allowing [place] to partner with you on your healthcare journey. I am thankful to be able to provide care to you today.   Wyn Quaker FNP-C

## 2020-01-30 ENCOUNTER — Telehealth: Payer: Self-pay | Admitting: Pulmonary Disease

## 2020-01-30 MED ORDER — TRELEGY ELLIPTA 100-62.5-25 MCG/INH IN AEPB: 1.0000 | INHALATION_SPRAY | Freq: Every day | RESPIRATORY_TRACT | 12 refills | Status: DC

## 2020-01-30 NOTE — Telephone Encounter (Signed)
Called and left message for patient that 12 month prescription for Trelegy was printed and placed in Dr. Agustina Caroli box to sign. Note on scripts says to call the patient when the script is signed. Nothing further needed.

## 2020-01-31 ENCOUNTER — Telehealth: Payer: Self-pay

## 2020-01-31 ENCOUNTER — Telehealth: Payer: Self-pay | Admitting: Pulmonary Disease

## 2020-01-31 NOTE — Telephone Encounter (Signed)
Left voicemail letting pt know that Trelegy script was signed and would be at check in desk waiting to be picked up. Nothing further needed at this time.

## 2020-01-31 NOTE — Telephone Encounter (Signed)
Called and spoke with patient. Script has been printed and signed by Dr. Lamonte Sakai. Placed upfront to be mailed. Patient confirmed mailing address. Nothing further needed at this time.

## 2020-02-28 ENCOUNTER — Encounter (INDEPENDENT_AMBULATORY_CARE_PROVIDER_SITE_OTHER): Payer: Self-pay | Admitting: Gastroenterology

## 2020-03-21 ENCOUNTER — Ambulatory Visit (INDEPENDENT_AMBULATORY_CARE_PROVIDER_SITE_OTHER): Payer: Medicare HMO | Admitting: Gastroenterology

## 2020-04-04 ENCOUNTER — Ambulatory Visit (INDEPENDENT_AMBULATORY_CARE_PROVIDER_SITE_OTHER): Payer: Medicare HMO | Admitting: Emergency Medicine

## 2020-04-04 ENCOUNTER — Ambulatory Visit (INDEPENDENT_AMBULATORY_CARE_PROVIDER_SITE_OTHER): Payer: Medicare HMO

## 2020-04-04 ENCOUNTER — Other Ambulatory Visit: Payer: Self-pay

## 2020-04-04 ENCOUNTER — Encounter: Payer: Self-pay | Admitting: Emergency Medicine

## 2020-04-04 VITALS — BP 124/74 | HR 82 | Temp 97.3°F | Ht 71.0 in | Wt 279.2 lb

## 2020-04-04 DIAGNOSIS — U071 COVID-19: Secondary | ICD-10-CM | POA: Diagnosis not present

## 2020-04-04 DIAGNOSIS — J9611 Chronic respiratory failure with hypoxia: Secondary | ICD-10-CM | POA: Diagnosis not present

## 2020-04-04 DIAGNOSIS — G4733 Obstructive sleep apnea (adult) (pediatric): Secondary | ICD-10-CM | POA: Diagnosis not present

## 2020-04-04 DIAGNOSIS — J449 Chronic obstructive pulmonary disease, unspecified: Secondary | ICD-10-CM

## 2020-04-04 DIAGNOSIS — J1282 Pneumonia due to coronavirus disease 2019: Secondary | ICD-10-CM | POA: Diagnosis not present

## 2020-04-04 DIAGNOSIS — J4489 Other specified chronic obstructive pulmonary disease: Secondary | ICD-10-CM

## 2020-04-04 DIAGNOSIS — Z9989 Dependence on other enabling machines and devices: Secondary | ICD-10-CM

## 2020-04-04 NOTE — Assessment & Plan Note (Signed)
Continue to wear your CPAP reliably every night

## 2020-04-04 NOTE — Assessment & Plan Note (Signed)
Wear your oxygen at 2 to 3 L/min with exertion. Our goal is to keep your saturations greater than 90%

## 2020-04-04 NOTE — Assessment & Plan Note (Addendum)
Hospitalized for 2 days in August. Need a baseline chest x-ray today. If infiltrates do not clear or if he has residual interstitial disease then he may need a CT chest in the future.

## 2020-04-04 NOTE — Patient Instructions (Addendum)
Chest x-ray today Continue Trelegy 1 elation once daily. Rinse and gargle after using. Keep your albuterol available to use 2 puffs or 1 nebulizer treatment up to every 4 hours if needed for shortness of breath Wear your oxygen at 2 to 3 L/min with exertion. Our goal is to keep your saturations greater than 90% Continue to wear your CPAP reliably every night Get your flu shot when available Follow with Dr Lamonte Sakai in 6 months or sooner if you have any problems

## 2020-04-04 NOTE — Progress Notes (Signed)
Subjective:    Patient ID: Tommy Riley, male    DOB: April 11, 1946, 74 y.o.   MRN: [id number]  HPI ROV 08/29/2018 --this is a follow-up visit for patient with severe COPD, OSA, multifactorial secondary pulmonary hypertension due to this and also left-sided heart disease as outlined above.  At his last visit I tried changing Incruse/Advair to Stiolto to see if he would get more benefit.  I hope that a non-powdered bronchodilator formulation would also help with some chronic cough and upper airway irritation.  Today he reports.  We also added back Mucinex. His O2 has been titrated to 4-5L/min. Excellent compliance with his CPAP documented on download today. He has been seen by cards in [place], had a CXR that showed some volume overload, TTE showed a decrease in his LV fxn from 50 > 35%. He was diuresed and started on Entresto, does feel better, has lost 12 lbs fluid. He is unsure whether the Stiolto is any better than his prior regimen, but he would like to stay on it if possible.   ROV 04/04/20 --74 year old gentleman with severe COPD, OSA on CPAP, multifactorial secondary pulmonary hypertension in the setting of this as well as left-sided heart disease. He benefited from [place] and was able to wean off of supplemental oxygen. He was unfortunately diagnosed with COVID-19 in August. He is back on O2, is using 3 L/min pulsed currently. He is starting to recover, received monoclonal Ab.   He is on CPAP 17 cmH2O. COPD currently managed with Trelegy. He uses albuterol 1-2x a day Compliance data reviewed between 2/22 and 09/26/2019, shows 93% usage for greater than 4 hours at night, good control of his apneas. Good clinical benefit reported     Objective:   Physical Exam Vitals:   04/04/20 1411  BP: 124/74  Pulse: 82  Temp: (!) 97.3 F (36.3 C)  TempSrc: Temporal  SpO2: 97%  Weight: 279 lb 3.2 oz (126.6 kg)  Height: 5\' 11"  (1.803 m)   Gen: Pleasant, obese man, in no distress,  normal  affect  ENT: No lesions,  mouth clear,  oropharynx clear, no postnasal drip  Neck: No JVD, no stridor  Lungs: No use of accessory muscles, distant, small breaths decreased at bases, no crackles or wheezing on normal respiration, he does wheeze on a forced exp.    Cardiovascular: RRR, heart sounds normal, no murmur or gallops, 1+ peripheral edema  Musculoskeletal: No deformities, no cyanosis or clubbing  Neuro: alert, awake, non focal  Skin: Warm, no lesions or rash, healed       Assessment & Plan:  COPD (chronic obstructive pulmonary disease) with chronic bronchitis Chest x-ray today Continue Trelegy 1 elation once daily. Rinse and gargle after using. Keep your albuterol available to use 2 puffs or 1 nebulizer treatment up to every 4 hours if needed for shortness of breath Get your flu shot when available Follow with Dr Lamonte Sakai in 6 months or sooner if you have any problems  OSA on CPAP Continue to wear your CPAP reliably every night  Chronic respiratory failure with hypoxia ([place]) Wear your oxygen at 2 to 3 L/min with exertion. Our goal is to keep your saturations greater than 90%  Pneumonia due to COVID-19 virus Hospitalized for 2 days in August. Need a baseline chest x-ray today. If infiltrates do not clear or if he has residual interstitial disease then he may need a CT chest in the future.    Baltazar Apo, MD, PhD 04/04/2020, 2:27  PM [facility] [phone] or if no answer [phone]

## 2020-04-04 NOTE — Assessment & Plan Note (Signed)
Chest x-ray today Continue Trelegy 1 elation once daily. Rinse and gargle after using. Keep your albuterol available to use 2 puffs or 1 nebulizer treatment up to every 4 hours if needed for shortness of breath Get your flu shot when available Follow with Dr Lamonte Sakai in 6 months or sooner if you have any problems

## 2020-04-10 ENCOUNTER — Telehealth: Payer: Self-pay | Admitting: Emergency Medicine

## 2020-04-10 DIAGNOSIS — U071 COVID-19: Secondary | ICD-10-CM

## 2020-04-10 NOTE — Telephone Encounter (Signed)
Spoke with pt to let him know that CXR on 04/05/20  had not been read yet. Pt stated understanding.   Dr. Lamonte Sakai please advise. Thank you

## 2020-04-11 NOTE — Telephone Encounter (Signed)
Please let him know that there is still some mild interstitial markings on his chest x-ray likely left over from his COVID-19 pneumonia. I like for him to get a high-resolution CT scan of the chest without contrast so that we can better characterize. If he is okay with this then please order for him thank you

## 2020-04-11 NOTE — Telephone Encounter (Signed)
Spoke with pt and reviewed CXR result. Pt stated understanding and agreed to have HR CT completed. HR CT ordered was placed. Nothing further needed at this time.

## 2020-04-23 ENCOUNTER — Other Ambulatory Visit: Payer: Self-pay

## 2020-04-23 ENCOUNTER — Ambulatory Visit (HOSPITAL_COMMUNITY)
Admission: RE | Admit: 2020-04-23 | Discharge: 2020-04-23 | Disposition: A | Discharge status: Home / Self Care | Payer: Medicare HMO | Source: Ambulatory Visit | Attending: Emergency Medicine | Admitting: Emergency Medicine

## 2020-04-23 DIAGNOSIS — J1282 Pneumonia due to coronavirus disease 2019: Secondary | ICD-10-CM | POA: Diagnosis present

## 2020-04-23 DIAGNOSIS — U071 COVID-19: Secondary | ICD-10-CM

## 2020-09-12 ENCOUNTER — Telehealth: Payer: Self-pay | Admitting: Emergency Medicine

## 2020-09-12 MED ORDER — ALBUTEROL SULFATE HFA 108 (90 BASE) MCG/ACT IN AERS: 2.0000 | INHALATION_SPRAY | Freq: Four times a day (QID) | RESPIRATORY_TRACT | 1 refills | Status: DC | PRN

## 2020-09-12 NOTE — Telephone Encounter (Signed)
I have called and spoke with pt and he stated that he has a pending appt with RB on 04/26 and he would like to know if RB is ok with that being a televisit.  Pt stated with the price of gas going up he will not be able to come from [place] to this appt.  RB please advise. Thanks

## 2020-09-16 NOTE — Telephone Encounter (Signed)
Yes, Ok to change it

## 2020-09-16 NOTE — Telephone Encounter (Signed)
Called and spoke with pt and he is aware that the 4/26 appt is going to be a televisit per Patrick.  Nothing further is needed.

## 2020-09-22 ENCOUNTER — Other Ambulatory Visit: Payer: Self-pay | Admitting: Emergency Medicine

## 2020-09-23 ENCOUNTER — Other Ambulatory Visit: Payer: Self-pay | Admitting: Emergency Medicine

## 2020-09-24 ENCOUNTER — Institutional Professional Consult (permissible substitution): Payer: Medicare HMO | Admitting: Internal Medicine

## 2020-09-27 ENCOUNTER — Other Ambulatory Visit: Payer: Self-pay

## 2020-09-27 MED ORDER — ALBUTEROL SULFATE HFA 108 (90 BASE) MCG/ACT IN AERS: INHALATION_SPRAY | RESPIRATORY_TRACT | 1 refills | Status: DC

## 2020-10-07 ENCOUNTER — Telehealth: Payer: Self-pay | Admitting: Emergency Medicine

## 2020-10-07 MED ORDER — ALBUTEROL SULFATE HFA 108 (90 BASE) MCG/ACT IN AERS: INHALATION_SPRAY | RESPIRATORY_TRACT | 1 refills | Status: DC

## 2020-10-07 NOTE — Telephone Encounter (Signed)
Called and spoke with patient who states he needs his albuterol inhaler sent to [facility]. Told him that it was sent on 09/27/20 but would try to send it again. He also stated that his CPAP machine is flashing needs download. Looked in [place] and it hasn't been updated for a year. Confirmed DME. Called DME to see if they could update it to see if that would get rid of notification. Was informed that they are not getting anything to come up for patient and that they are going to call him to get serial number and get everything updated. Nothing further needed at this time.

## 2020-10-29 ENCOUNTER — Encounter: Payer: Self-pay | Admitting: Emergency Medicine

## 2020-10-29 ENCOUNTER — Ambulatory Visit (INDEPENDENT_AMBULATORY_CARE_PROVIDER_SITE_OTHER): Payer: Medicare Other | Admitting: Emergency Medicine

## 2020-10-29 ENCOUNTER — Other Ambulatory Visit: Payer: Self-pay

## 2020-10-29 DIAGNOSIS — J449 Chronic obstructive pulmonary disease, unspecified: Secondary | ICD-10-CM

## 2020-10-29 DIAGNOSIS — G4733 Obstructive sleep apnea (adult) (pediatric): Secondary | ICD-10-CM

## 2020-10-29 DIAGNOSIS — Z9989 Dependence on other enabling machines and devices: Secondary | ICD-10-CM | POA: Diagnosis not present

## 2020-10-29 DIAGNOSIS — J9611 Chronic respiratory failure with hypoxia: Secondary | ICD-10-CM | POA: Diagnosis not present

## 2020-10-29 NOTE — Assessment & Plan Note (Signed)
Great compliance and good clinical benefit confirmed today at our visit.  Plan to continue same

## 2020-10-29 NOTE — Progress Notes (Signed)
Virtual Visit via Telephone Note  I connected with Tommy Riley on 10/29/20 at  2:45 PM EDT by telephone and verified that I am speaking with the correct person using two identifiers.  Location: Patient: home Provider: office   I discussed the limitations, risks, security and privacy concerns of performing an evaluation and management service by telephone and the availability of in person appointments. I also discussed with the patient that there may be a patient responsible charge related to this service. The patient expressed understanding and agreed to proceed.   History of Present Illness: 75 year old man with severe COPD, OSA on CPAP, multifactorial secondary pulmonary hypertension due to these things as well as left-sided heart disease.  He had COVID-19 in August 2021 and had to go back on oxygen at 3 L/min pulsed.   Observations/Objective: Currently managed on CPAP 17 cmH2O; in good repair. Great compliance. He has better energy, better sleep, less daytime sleepiness.  COPD treated with Trelegy. No flares, no pred no abx.  Uses albuterol HFA every evening, before he showers. Rarely uses albuterol nebs.  Today he reports that he still gets SOB w exertion, longer distances. Difficulty with stairs. He feels much better compared with when he had COVID.   O2 >> he has been able to wean to off, no desats seen w exertion. Still has an [employer] if he were to need it.    Assessment and Plan:  COPD (chronic obstructive pulmonary disease) with chronic bronchitis Severe disease but overall clinically stable.  He has benefited from Trelegy.  No flares.  Stable albuterol use.  Chronic respiratory failure with hypoxia (HCC) He follows a saturations and has been able to wean off supplemental oxygen.  If his dyspnea changes then we will repeat an amatory oximetry to ensure that he does not need to be back on.  He has an [employer] POC if he needs it  OSA on CPAP Great compliance and good clinical  benefit confirmed today at our visit.  Plan to continue same   Follow Up Instructions: 6 months   I discussed the assessment and treatment plan with the patient. The patient was provided an opportunity to ask questions and all were answered. The patient agreed with the plan and demonstrated an understanding of the instructions.   The patient was advised to call back or seek an in-person evaluation if the symptoms worsen or if the condition fails to improve as anticipated.  I provided 18 minutes of non-face-to-face time during this encounter.   Collene Gobble, MD

## 2020-10-29 NOTE — Assessment & Plan Note (Signed)
He follows a saturations and has been able to wean off supplemental oxygen.  If his dyspnea changes then we will repeat an amatory oximetry to ensure that he does not need to be back on.  He has an Inogen POC if he needs it

## 2020-10-29 NOTE — Assessment & Plan Note (Signed)
Severe disease but overall clinically stable.  He has benefited from Trelegy.  No flares.  Stable albuterol use.

## 2020-10-31 ENCOUNTER — Telehealth: Payer: Self-pay | Admitting: Emergency Medicine

## 2020-10-31 DIAGNOSIS — G4733 Obstructive sleep apnea (adult) (pediatric): Secondary | ICD-10-CM

## 2020-10-31 NOTE — Telephone Encounter (Signed)
Called and spoke with patient. He stated that he would like to have an order sent to [facility] in [place] for his cpap supplies. I advised him that I would go ahead and place the order for him. He verbalized understanding.   Nothing further needed at time of call.

## 2020-11-04 ENCOUNTER — Telehealth: Payer: Self-pay | Admitting: Emergency Medicine

## 2020-11-04 NOTE — Telephone Encounter (Signed)
Called and spoke with Patient. Patient stated [facility] needed LOV from Dr. Lamonte Sakai and prescription for new cpap supplies. Cpap supplies order was placed 10/31/20 by Wallene Dales, CMA. LOV note faxed to [facility] at [phone]. Nothing further at this time.

## 2020-12-06 ENCOUNTER — Other Ambulatory Visit: Payer: Self-pay

## 2020-12-06 ENCOUNTER — Ambulatory Visit: Payer: Medicare Other | Admitting: Cardiology

## 2020-12-06 ENCOUNTER — Encounter: Payer: Self-pay | Admitting: Cardiology

## 2020-12-06 VITALS — BP 122/78 | HR 79 | Ht 71.5 in | Wt 277.0 lb

## 2020-12-06 DIAGNOSIS — I25119 Atherosclerotic heart disease of native coronary artery with unspecified angina pectoris: Secondary | ICD-10-CM

## 2020-12-06 DIAGNOSIS — I255 Ischemic cardiomyopathy: Secondary | ICD-10-CM

## 2020-12-06 DIAGNOSIS — Z9989 Dependence on other enabling machines and devices: Secondary | ICD-10-CM

## 2020-12-06 DIAGNOSIS — I10 Essential (primary) hypertension: Secondary | ICD-10-CM

## 2020-12-06 DIAGNOSIS — I4821 Permanent atrial fibrillation: Secondary | ICD-10-CM

## 2020-12-06 DIAGNOSIS — G4733 Obstructive sleep apnea (adult) (pediatric): Secondary | ICD-10-CM | POA: Diagnosis not present

## 2020-12-06 NOTE — Progress Notes (Signed)
Cardiology Office Note  Date: 12/06/2020   ID: Tommy Riley, DOB 12/25/1945, MRN [id number]  PCP:  Clinton Quant, MD  Cardiologist:  Rozann Lesches, MD Electrophysiologist:  None   Chief Complaint  Patient presents with  . Establish cardiac follow-up    History of Present Illness: Tommy Riley is a 75 y.o. male referred for cardiology consultation by Ms. Tommy Riley with history of atrial fibrillation.  I reviewed the available records.  He was previously followed by Dr. Alroy Dust in [place], switching practices in light of change in insurance.  He has a history of multivessel CAD status post CABG in 2001 at a facility in [place].  Also previous atrial fibrillation ablation at [facility] back in 2011 with subsequent persistent atrial fibrillation and treatment with strategy of heart rate control and anticoagulation.  CHA2DS2-VASc score is 7.  He checks PT/INR at home and would like to establish in our anticoagulation clinic.  No reported spontaneous bleeding problems.  He states that he was diagnosed with a cardiomyopathy earlier this year, LVEF approximately 30%.  This reportedly improved to 50% with medical therapy adjustments as noted below.  He reports NYHA class II dyspnea, no orthopnea or PND.  Also has no sense of palpitations with atrial fibrillation.  Current cardiac regimen includes Toprol-XL, Entresto, Aldactone, Farxiga, and Demadex with potassium supplement.  I personally reviewed his ECG today which shows rate controlled atrial fibrillation with rightward axis and IVCD.  Past Medical History:  Diagnosis Date  . Anxiety   . Atrial fibrillation and flutter ([place])    Atrial fibrillation ablation 2011 at [facility]  . Chronic systolic heart failure ([place])   . CKD (chronic kidney disease), stage III ([place])   . COPD (chronic obstructive pulmonary disease) ([place])   . COVID-22 February 2020  . Depression   . Essential hypertension   . Gout   . Hyperlipidemia   .  Ischemic heart disease   . OSA (obstructive sleep apnea)   . Stroke [facility]) 2003  . Type 2 diabetes mellitus ([place])     Past Surgical History:  Procedure Laterality Date  . ATRIAL FIBRILLATION ABLATION  2011   [facility]  . CATARACT EXTRACTION Bilateral   . CORONARY ARTERY BYPASS GRAFT  2001  . HERNIA REPAIR Right   . TONSILLECTOMY AND ADENOIDECTOMY      Current Outpatient Medications  Medication Sig Dispense Refill  . ACCU-CHEK AVIVA PLUS test strip     . albuterol (PROVENTIL) (2.5 MG/3ML) 0.083% nebulizer solution Take 3 mLs (2.5 mg total) by nebulization every 6 (six) hours as needed for wheezing or shortness of breath. 75 mL 4  . albuterol (VENTOLIN HFA) 108 (90 Base) MCG/ACT inhaler USE 2 INHALATIONS BY MOUTH  EVERY 6 HOURS AS NEEDED FOR WHEEZING OR SHORTNESS OF  BREATH 54 g 1  . allopurinol (ZYLOPRIM) 100 MG tablet Take 2 tablets in the morning and 1 tablet in the evening    . aspirin 81 MG tablet Take 81 mg by mouth daily.    Marland Kitchen b complex vitamins tablet Take 1 tablet by mouth daily.    . Blood Glucose Monitoring Suppl (ONE TOUCH ULTRA 2) W/DEVICE KIT by Does not apply route.    . Cholecalciferol (VITAMIN D3) 2000 UNITS TABS Take by mouth daily.    Marland Kitchen ezetimibe (ZETIA) 10 MG tablet Take 10 mg by mouth daily.    . Fluticasone-Umeclidin-Vilant (TRELEGY ELLIPTA) 100-62.5-25 MCG/INH AEPB Inhale 1 puff into the lungs  daily. 1 each 12  . folic acid (FOLVITE) 160 MCG tablet Take 400 mcg by mouth daily.    . insulin aspart (NOVOLOG) 100 UNIT/ML injection Inject into the skin daily with breakfast. Up to 20 units    . insulin detemir (LEVEMIR) 100 UNIT/ML injection Inject 80 Units into the skin daily.    Marland Kitchen MELATONIN PO Take 6 mg by mouth daily.    . metoprolol succinate (TOPROL-XL) 50 MG 24 hr tablet Take 50 mg by mouth 2 (two) times daily. Take 1 tablet 2 times daily    . Multiple Vitamins-Minerals (CENTRUM SILVER PO) Take 1 tablet by mouth daily.     Marland Kitchen omega-3 acid ethyl esters (LOVAZA) 1 G  capsule Take by mouth in the morning, at noon, and at bedtime.    . Potassium Chloride (KLOR-CON 10 PO) Take 20 mg by mouth in the morning, at noon, and at bedtime. Extended release    . rosuvastatin (CRESTOR) 40 MG tablet Take 40 mg by mouth daily.    . sacubitril-valsartan (ENTRESTO) 24-26 MG Take 1 tablet by mouth 2 (two) times daily.    . SERTRALINE HCL PO Take 150 mg by mouth daily.    Marland Kitchen spironolactone (ALDACTONE) 100 MG tablet Take 100 mg by mouth daily.    Marland Kitchen torsemide (DEMADEX) 20 MG tablet Take 40 mg by mouth 2 (two) times daily.    . vitamin C (ASCORBIC ACID) 500 MG tablet Take 500 mg by mouth daily.    Marland Kitchen warfarin (COUMADIN) 3 MG tablet Take 3 mg by mouth daily. And take 1 mg on Monday, Wednesday and Friday    . FARXIGA 10 MG TABS tablet Take 10 mg by mouth daily.     No current facility-administered medications for this visit.   Allergies:  Codeine   Social History: The patient  reports that he quit smoking about 9 years ago. His smoking use included cigarettes. He has a 125.00 pack-year smoking history. He has never used smokeless tobacco. He reports that he does not drink alcohol and does not use drugs.   Family History: The patient's family history includes Diabetes Mellitus II in his father and sister; Hypertension in his father and sister; Raynaud syndrome in his mother; Stroke in his brother and father.   ROS: No dizziness or syncope.  Physical Exam: VS:  BP 122/78 (BP Location: Right Arm)   Pulse 79   Ht 5' 11.5" (1.816 m)   Wt 277 lb (125.6 kg)   SpO2 94%   BMI 38.10 kg/m , BMI Body mass index is 38.1 kg/m.  Wt Readings from Last 3 Encounters:  12/06/20 277 lb (125.6 kg)  04/04/20 279 lb 3.2 oz (126.6 kg)  09/19/19 283 lb (128.4 kg)    General: Patient appears comfortable at rest. HEENT: Conjunctiva and lids normal, wearing a mask. Neck: Supple, no elevated JVP or carotid bruits, no thyromegaly. Lungs: Clear to auscultation, nonlabored breathing at  rest. Cardiac: Irregularly irregular, no S3, 1/6 systolic murmur, no pericardial rub. Abdomen: Protuberant, bowel sounds present, no guarding or rebound. Extremities: Trace ankle edema, distal pulses 2+. Skin: Warm and dry. Musculoskeletal: No kyphosis. Neuropsychiatric: Alert and oriented x3, affect grossly appropriate.  ECG:  An ECG dated 04/28/2017 was personally reviewed today and demonstrated:  Atrial fibrillation with left bundle branch block.  Recent Labwork:  October 2021: Cholesterol 130, triglycerides 213, HDL 39, LDL 48, AST 25, ALT 23, BUN 29, creatinine 1.2, potassium 4.8, hemoglobin 14.3, platelets 210, hemoglobin A1c 6.3%  Other Studies Reviewed Today:  Echocardiogram 04/28/2017 ([facility]): INTERPRETATION ---------------------------------------------------------------  NORMAL LEFT VENTRICULAR SYSTOLIC FUNCTION WITH MILD LVH  NORMAL RIGHT VENTRICULAR SYSTOLIC FUNCTION  VALVULAR REGURGITATION: TRIVIAL MR, TRIVIAL PR, TRIVIAL TR  NO VALVULAR STENOSIS  UNABLE TO PERFORM RVLS DUE TO POOR SOUND TRANSMISSION  PATIENT IN A-FIB THROUGHOUT EXAM  -DILATED ASCENDING AORTA (4.0cm)  -INSUFFICIENT TR TO ACCURATELY ESTIMATE BUT PA ACCELERATION TIME (0.08sec)  AND PR END DIASTOLIC VELOCITY >5R/KVT SUGGEST PRESENCE OF PULMONARY  HYPERTENSION.  GROSSLY NORMAL BUBBLE STUDY BUT VERY POOR QUALITY   Chest CT 04/23/2020: IMPRESSION: 1. No evidence of fibrotic interstitial lung disease. Mild, bland appearing post infectious or inflammatory bandlike scarring of the left greater than right lung bases without specific features to suggest fibrotic interstitial lung disease. 2. Cardiomegaly and coronary artery disease. 3. Enlargement of the main pulmonary artery, as can be seen in pulmonary hypertension. 4. Aortic Atherosclerosis (ICD10-I70.0). 5. Cholelithiasis.  Assessment and Plan:  1.  History of multivessel CAD status post CABG in 2001.  I do not have details of  graft anatomy at this time.  We are requesting records from Dr. Alroy Dust to include his most recent echocardiogram and stress test.  He does not describe any active angina at this point.  Currently on aspirin, Crestor, Zetia, and Toprol-XL.  2.  Presumed ischemic cardiomyopathy.  Reportedly LVEF improved to 50% with medical therapy adjustments.  Requesting most recent echocardiogram results.  For now continue Toprol-XL, Entresto, Aldactone, Demadex with potassium supplement, and [place].  3.  Permanent atrial fibrillation with CHA2DS2-VASc score of 7.  He checks PT/INR at home, we will get him enrolled in our anticoagulation clinic.  4.  OSA on CPAP.  He follows with Dr. Lamonte Sakai.  5.  Essential hypertension, systolic in the 552Z today.  Medication Adjustments/Labs and Tests Ordered: Current medicines are reviewed at length with the patient today.  Concerns regarding medicines are outlined above.   Tests Ordered: Orders Placed This Encounter  Procedures  . EKG 12-Lead    Medication Changes: No orders of the defined types were placed in this encounter.   Disposition:  Follow up 6 months.  Signed, Satira Sark, MD, [facility] 12/06/2020 2:10 PM    [facility] [address], [place], [place] [address] Phone: [phone]; Fax: [phone]

## 2020-12-06 NOTE — Patient Instructions (Signed)
Medication Instructions:  Your physician recommends that you continue on your current medications as directed. Please refer to the Current Medication list given to you today.  *If you need a refill on your cardiac medications before your next appointment, please call your pharmacy*   Lab Work: None today  If you have labs (blood work) drawn today and your tests are completely normal, you will receive your results only by: Marland Kitchen MyChart Message (if you have MyChart) OR . A paper copy in the mail If you have any lab test that is abnormal or we need to change your treatment, we will call you to review the results.   Testing/Procedures: None today    Follow-Up: At [facility], you and your health needs are our priority.  As part of our continuing mission to provide you with exceptional heart care, we have created designated Provider Care Teams.  These Care Teams include your primary Cardiologist (physician) and Advanced Practice Providers (APPs -  Physician Assistants and Nurse Practitioners) who all work together to provide you with the care you need, when you need it.  We recommend signing up for the patient portal called "MyChart".  Sign up information is provided on this After Visit Summary.  MyChart is used to connect with patients for Virtual Visits (Telemedicine).  Patients are able to view lab/test results, encounter notes, upcoming appointments, etc.  Non-urgent messages can be sent to your provider as well.   To learn more about what you can do with MyChart, go to [email or web address].    Your next appointment:   6 month(s)  The format for your next appointment:   In Person  Provider:   Rozann Lesches, MD   Other Instructions Establish care with Edrick Oh, RN  Coumadin clinic at our [facility] office.

## 2020-12-09 ENCOUNTER — Ambulatory Visit (INDEPENDENT_AMBULATORY_CARE_PROVIDER_SITE_OTHER): Payer: Medicare Other | Admitting: *Deleted

## 2020-12-09 DIAGNOSIS — Z5181 Encounter for therapeutic drug level monitoring: Secondary | ICD-10-CM | POA: Diagnosis not present

## 2020-12-09 DIAGNOSIS — I4821 Permanent atrial fibrillation: Secondary | ICD-10-CM

## 2020-12-09 DIAGNOSIS — I4891 Unspecified atrial fibrillation: Secondary | ICD-10-CM

## 2020-12-09 LAB — POCT INR: INR: 2.6 (ref 2.0–3.0)

## 2020-12-09 NOTE — Patient Instructions (Signed)
Continue warfarin 4mg  daily except 3mg  on Tuesdays, Thursdays and Saturdays Self tester thru [place].  Pt tests every Monday.

## 2020-12-16 LAB — POCT INR: INR: 2.6 (ref 2.0–3.0)

## 2020-12-19 ENCOUNTER — Ambulatory Visit (INDEPENDENT_AMBULATORY_CARE_PROVIDER_SITE_OTHER): Payer: Medicare Other | Admitting: *Deleted

## 2020-12-19 DIAGNOSIS — Z5181 Encounter for therapeutic drug level monitoring: Secondary | ICD-10-CM | POA: Diagnosis not present

## 2020-12-19 DIAGNOSIS — I4891 Unspecified atrial fibrillation: Secondary | ICD-10-CM | POA: Diagnosis not present

## 2020-12-19 NOTE — Patient Instructions (Signed)
Continue warfarin 4mg  daily except 3mg  on Tuesdays, Thursdays and Saturdays Self tester thru [place].  Pt tests every Monday. Recheck 12/23/20

## 2020-12-23 ENCOUNTER — Ambulatory Visit (INDEPENDENT_AMBULATORY_CARE_PROVIDER_SITE_OTHER): Payer: Medicare Other | Admitting: *Deleted

## 2020-12-23 ENCOUNTER — Telehealth: Payer: Self-pay | Admitting: *Deleted

## 2020-12-23 DIAGNOSIS — Z5181 Encounter for therapeutic drug level monitoring: Secondary | ICD-10-CM | POA: Diagnosis not present

## 2020-12-23 DIAGNOSIS — I4891 Unspecified atrial fibrillation: Secondary | ICD-10-CM | POA: Diagnosis not present

## 2020-12-23 LAB — POCT INR: INR: 2.5 (ref 2.0–3.0)

## 2020-12-23 NOTE — Telephone Encounter (Signed)
Spoke with pt.  Coumadin instructions given.  See coumadin note. 

## 2020-12-23 NOTE — Patient Instructions (Signed)
Continue warfarin 4mg  daily except 3mg  on Tuesdays, Thursdays and Saturdays Self tester thru [place].  Pt tests every Monday. Recheck 12/30/20 Pt told and verbalized understanding

## 2020-12-23 NOTE — Telephone Encounter (Signed)
INR: 2.5 taken today.

## 2020-12-24 ENCOUNTER — Telehealth: Payer: Self-pay | Admitting: Cardiology

## 2020-12-24 NOTE — Telephone Encounter (Signed)
Patient called to let you know that [place] approved for his [place] they will be faxing you a over the paperwork today

## 2020-12-25 NOTE — Telephone Encounter (Signed)
Received letter from [employer] stating that pt has been approved for the Prescription Savings Program. Pt is approved effective 12/24/20-07/05/21.

## 2020-12-27 ENCOUNTER — Other Ambulatory Visit: Payer: Self-pay | Admitting: *Deleted

## 2020-12-27 MED ORDER — FARXIGA 10 MG PO TABS: 10.0000 mg | ORAL_TABLET | Freq: Every day | ORAL | 90 refills | Status: DC

## 2020-12-30 ENCOUNTER — Telehealth: Payer: Self-pay | Admitting: *Deleted

## 2020-12-30 ENCOUNTER — Ambulatory Visit (INDEPENDENT_AMBULATORY_CARE_PROVIDER_SITE_OTHER): Payer: Medicare Other | Admitting: *Deleted

## 2020-12-30 DIAGNOSIS — I4891 Unspecified atrial fibrillation: Secondary | ICD-10-CM

## 2020-12-30 DIAGNOSIS — Z5181 Encounter for therapeutic drug level monitoring: Secondary | ICD-10-CM | POA: Diagnosis not present

## 2020-12-30 LAB — POCT INR: INR: 2.8 (ref 2.0–3.0)

## 2020-12-30 NOTE — Telephone Encounter (Signed)
LMOM for pt to continue current dose and recheck INR next week.

## 2020-12-30 NOTE — Patient Instructions (Signed)
Continue warfarin 4mg  daily except 3mg  on Tuesdays, Thursdays and Saturdays Self tester thru [place].  Pt tests every Monday. Recheck 01/08/21 Pt told and verbalized understanding

## 2020-12-30 NOTE — Telephone Encounter (Signed)
INR 2.8

## 2021-01-07 ENCOUNTER — Other Ambulatory Visit: Payer: Self-pay | Admitting: Emergency Medicine

## 2021-01-07 ENCOUNTER — Telehealth: Payer: Self-pay | Admitting: Emergency Medicine

## 2021-01-07 MED ORDER — TRELEGY ELLIPTA 100-62.5-25 MCG/INH IN AEPB: 1.0000 | INHALATION_SPRAY | Freq: Every day | RESPIRATORY_TRACT | 3 refills | Status: DC

## 2021-01-07 NOTE — Telephone Encounter (Signed)
I called and spoke with patient wife Aram Beecham who is on DPR regarding paper script. Patient is needing a paper script for the Trelegy inhaler for the patient assistance form he is trying to get. It needs to be written for 90 days supplies. Dr. Lamonte Sakai is not in the office this week so will ask one of the NP if they are ok signing the script so the patient does not have to wait. Wife verbalized understanding. Will route to Derl Barrow to see if ok signing.  Beth, please advise if you are ok signing script for patient assistance since [place] is out all week. Thanks!

## 2021-01-07 NOTE — Telephone Encounter (Signed)
Yes, I am in [place] Wednesday but can sign it Thursday. If needed sooner have either Tammy or [name] sign tomorrow

## 2021-01-08 ENCOUNTER — Telehealth: Payer: Self-pay | Admitting: *Deleted

## 2021-01-08 ENCOUNTER — Ambulatory Visit (INDEPENDENT_AMBULATORY_CARE_PROVIDER_SITE_OTHER): Payer: Medicare Other | Admitting: *Deleted

## 2021-01-08 DIAGNOSIS — Z5181 Encounter for therapeutic drug level monitoring: Secondary | ICD-10-CM

## 2021-01-08 DIAGNOSIS — I4891 Unspecified atrial fibrillation: Secondary | ICD-10-CM

## 2021-01-08 LAB — POCT INR: INR: 2.4 (ref 2.0–3.0)

## 2021-01-08 MED ORDER — TRELEGY ELLIPTA 100-62.5-25 MCG/INH IN AEPB: 1.0000 | INHALATION_SPRAY | Freq: Every day | RESPIRATORY_TRACT | 3 refills | Status: DC

## 2021-01-08 MED ORDER — TRELEGY ELLIPTA 100-62.5-25 MCG/INH IN AEPB: 1.0000 | INHALATION_SPRAY | Freq: Every day | RESPIRATORY_TRACT | 3 refills | Status: AC

## 2021-01-08 NOTE — Telephone Encounter (Signed)
ATC left voicemail for pt to return call to office.

## 2021-01-08 NOTE — Patient Instructions (Signed)
Continue warfarin 4mg  daily except 3mg  on Tuesdays, Thursdays and Saturdays Self tester thru [place].  Pt tests every Monday. Recheck 01/13/21 Pt told and verbalized understanding

## 2021-01-08 NOTE — Telephone Encounter (Signed)
Tommy Riley has signed script and I mailed it. I called and informed patient that it has been mailed and to let us know if anything else was needed. Patient verbalized understanding, nothing further needed.

## 2021-01-08 NOTE — Telephone Encounter (Signed)
Script printed and given to [place].  Will hold in triage.

## 2021-01-08 NOTE — Telephone Encounter (Signed)
It is fine to sign Trelegy under my name. Thanks

## 2021-01-08 NOTE — Telephone Encounter (Signed)
Patient called to speak to Edrick Oh to report his INR for today. It is 2.4. He can be reached at [phone]

## 2021-01-08 NOTE — Telephone Encounter (Signed)
LMOM for pt with warfarin instructions.  See coumadin note.

## 2021-01-08 NOTE — Telephone Encounter (Signed)
Pt returning a phone call. Pt can be reached at [phone]

## 2021-01-08 NOTE — Telephone Encounter (Signed)
[name], please advise if you are okay with [place] signing Rx for Trelegy under you so we can print it to send to pt as he is trying to apply for pt assistance.

## 2021-01-13 ENCOUNTER — Telehealth: Payer: Self-pay | Admitting: Cardiology

## 2021-01-13 ENCOUNTER — Ambulatory Visit (INDEPENDENT_AMBULATORY_CARE_PROVIDER_SITE_OTHER): Payer: Medicare Other | Admitting: *Deleted

## 2021-01-13 DIAGNOSIS — Z5181 Encounter for therapeutic drug level monitoring: Secondary | ICD-10-CM | POA: Diagnosis not present

## 2021-01-13 DIAGNOSIS — I4891 Unspecified atrial fibrillation: Secondary | ICD-10-CM | POA: Diagnosis not present

## 2021-01-13 LAB — POCT INR: INR: 2.2 (ref 2.0–3.0)

## 2021-01-13 NOTE — Patient Instructions (Signed)
Continue warfarin 4mg  daily except 3mg  on Tuesdays, Thursdays and Saturdays Self tester thru [place].  Pt tests every Monday. Recheck 01/20/21 Pt told and verbalized understanding

## 2021-01-13 NOTE — Telephone Encounter (Signed)
New message    Patient would like a call back regarding his INR results

## 2021-01-13 NOTE — Telephone Encounter (Signed)
Spoke with pt.  Coumadin instructions given and pt verbalized understanding.  See anticoag note.

## 2021-01-17 ENCOUNTER — Ambulatory Visit: Payer: Medicare Other | Admitting: Cardiology

## 2021-01-20 ENCOUNTER — Ambulatory Visit (INDEPENDENT_AMBULATORY_CARE_PROVIDER_SITE_OTHER): Payer: Medicare Other | Admitting: *Deleted

## 2021-01-20 ENCOUNTER — Telehealth: Payer: Self-pay | Admitting: *Deleted

## 2021-01-20 DIAGNOSIS — I4891 Unspecified atrial fibrillation: Secondary | ICD-10-CM | POA: Diagnosis not present

## 2021-01-20 DIAGNOSIS — Z5181 Encounter for therapeutic drug level monitoring: Secondary | ICD-10-CM

## 2021-01-20 LAB — POCT INR: INR: 2.8 (ref 2.0–3.0)

## 2021-01-20 NOTE — Patient Instructions (Signed)
Continue warfarin 4mg  daily except 3mg  on Tuesdays, Thursdays and Saturdays Self tester thru [place].  Pt tests every Monday. Recheck 01/27/21 Pt told and verbalized understanding

## 2021-01-20 NOTE — Telephone Encounter (Signed)
Patient called to provide his INR result is 2.8 today for [facility].

## 2021-01-20 NOTE — Telephone Encounter (Signed)
Pt called with coumadin instructions.  See coumadin note.

## 2021-01-27 ENCOUNTER — Ambulatory Visit (INDEPENDENT_AMBULATORY_CARE_PROVIDER_SITE_OTHER): Payer: Medicare Other | Admitting: *Deleted

## 2021-01-27 DIAGNOSIS — I4891 Unspecified atrial fibrillation: Secondary | ICD-10-CM | POA: Diagnosis not present

## 2021-01-27 DIAGNOSIS — Z5181 Encounter for therapeutic drug level monitoring: Secondary | ICD-10-CM | POA: Diagnosis not present

## 2021-01-27 LAB — POCT INR: INR: 2 (ref 2.0–3.0)

## 2021-01-27 NOTE — Patient Instructions (Signed)
Take extra '1mg'$  tablet today the resume '4mg'$  daily except '3mg'$  on Tuesdays, Thursdays and Saturdays Self tester thru [facility].  Pt tests every Monday. Recheck 02/03/21 Pt told and verbalized understanding

## 2021-01-28 ENCOUNTER — Telehealth: Payer: Self-pay | Admitting: Emergency Medicine

## 2021-01-28 MED ORDER — TRELEGY ELLIPTA 100-62.5-25 MCG/INH IN AEPB
1.0000 | INHALATION_SPRAY | Freq: Every day | RESPIRATORY_TRACT | 3 refills | Status: DC
Start: 2021-01-28 — End: 2021-04-21

## 2021-01-28 NOTE — Telephone Encounter (Signed)
I have printed out the RX for the trelegy.  Pt stated that the last rx that was sent in was only for 90 day supply.  Pt requested that this be faxed to [place].  Placed RX in [place] folder to be signed and faxed back.

## 2021-02-03 ENCOUNTER — Ambulatory Visit (INDEPENDENT_AMBULATORY_CARE_PROVIDER_SITE_OTHER): Payer: Medicare Other | Admitting: Cardiology

## 2021-02-03 DIAGNOSIS — Z5181 Encounter for therapeutic drug level monitoring: Secondary | ICD-10-CM

## 2021-02-03 LAB — POCT INR: INR: 2.4 (ref 2.0–3.0)

## 2021-02-03 NOTE — Patient Instructions (Signed)
Description   Called and spoke to pt instructed him to continue to take '4mg'$  daily excpet for '3mg'$  on Tuesday, Thursday and Saturdays. Recheck INR in 1 week- self tester.

## 2021-02-10 ENCOUNTER — Ambulatory Visit (INDEPENDENT_AMBULATORY_CARE_PROVIDER_SITE_OTHER): Payer: Medicare Other | Admitting: *Deleted

## 2021-02-10 DIAGNOSIS — Z5181 Encounter for therapeutic drug level monitoring: Secondary | ICD-10-CM

## 2021-02-10 DIAGNOSIS — I4891 Unspecified atrial fibrillation: Secondary | ICD-10-CM | POA: Diagnosis not present

## 2021-02-10 LAB — POCT INR: INR: 2.6 (ref 2.0–3.0)

## 2021-02-10 NOTE — Patient Instructions (Signed)
Called and spoke to pt instructed him to continue to take '4mg'$  daily excpet for '3mg'$  on Tuesday, Thursday and Saturdays. Recheck INR in 1 week- self tester.

## 2021-02-17 ENCOUNTER — Ambulatory Visit (INDEPENDENT_AMBULATORY_CARE_PROVIDER_SITE_OTHER): Payer: Medicare Other | Admitting: *Deleted

## 2021-02-17 DIAGNOSIS — Z5181 Encounter for therapeutic drug level monitoring: Secondary | ICD-10-CM | POA: Diagnosis not present

## 2021-02-17 DIAGNOSIS — I4891 Unspecified atrial fibrillation: Secondary | ICD-10-CM

## 2021-02-17 LAB — POCT INR: INR: 2.2 (ref 2.0–3.0)

## 2021-02-17 NOTE — Patient Instructions (Signed)
Called and spoke to pt instructed him to continue to take '4mg'$  daily excpet for '3mg'$  on Tuesday, Thursday and Saturdays. Recheck INR in 1 week- self tester.

## 2021-02-24 ENCOUNTER — Ambulatory Visit (INDEPENDENT_AMBULATORY_CARE_PROVIDER_SITE_OTHER): Payer: Medicare Other | Admitting: *Deleted

## 2021-02-24 LAB — POCT INR: INR: 2 (ref 2.0–3.0)

## 2021-02-24 NOTE — Patient Instructions (Signed)
Called and spoke to pt instructed him to continue to take '4mg'$  daily excpet for '3mg'$  on Tuesday, Thursday and Saturdays. Ease up on Vit K foods a little. Recheck INR in 1 week- self tester.

## 2021-03-10 LAB — POCT INR: INR: 2.4 (ref 2.0–3.0)

## 2021-03-11 ENCOUNTER — Ambulatory Visit (INDEPENDENT_AMBULATORY_CARE_PROVIDER_SITE_OTHER): Payer: Medicare Other | Admitting: *Deleted

## 2021-03-11 DIAGNOSIS — Z5181 Encounter for therapeutic drug level monitoring: Secondary | ICD-10-CM

## 2021-03-11 DIAGNOSIS — I4891 Unspecified atrial fibrillation: Secondary | ICD-10-CM | POA: Diagnosis not present

## 2021-03-11 NOTE — Patient Instructions (Signed)
LMOM for pt. Instructed him to continue to take '4mg'$  daily excpet for '3mg'$  on Tuesday, Thursday and Saturdays. Keep K foods consistent. Recheck INR in 1 week- self tester.

## 2021-03-17 ENCOUNTER — Ambulatory Visit (INDEPENDENT_AMBULATORY_CARE_PROVIDER_SITE_OTHER): Payer: Medicare Other | Admitting: *Deleted

## 2021-03-17 DIAGNOSIS — Z5181 Encounter for therapeutic drug level monitoring: Secondary | ICD-10-CM

## 2021-03-17 DIAGNOSIS — I4891 Unspecified atrial fibrillation: Secondary | ICD-10-CM | POA: Diagnosis not present

## 2021-03-17 LAB — POCT INR: INR: 2.1 (ref 2.0–3.0)

## 2021-03-17 NOTE — Patient Instructions (Signed)
LMOM for pt. Instructed him to continue to take '4mg'$  daily excpet for '3mg'$  on Tuesday, Thursday and Saturdays. Keep K foods consistent. Recheck INR in 1 week- self tester.

## 2021-03-24 ENCOUNTER — Ambulatory Visit (INDEPENDENT_AMBULATORY_CARE_PROVIDER_SITE_OTHER): Payer: Medicare Other | Admitting: *Deleted

## 2021-03-24 DIAGNOSIS — Z5181 Encounter for therapeutic drug level monitoring: Secondary | ICD-10-CM | POA: Diagnosis not present

## 2021-03-24 DIAGNOSIS — I4891 Unspecified atrial fibrillation: Secondary | ICD-10-CM

## 2021-03-24 LAB — POCT INR: INR: 2.3 (ref 2.0–3.0)

## 2021-03-24 NOTE — Patient Instructions (Signed)
LMOM for pt. Instructed him to continue to take '4mg'$  daily excpet for '3mg'$  on Tuesday, Thursday and Saturdays. Keep K foods consistent. Recheck INR in 1 week- self tester.

## 2021-03-28 ENCOUNTER — Telehealth: Payer: Self-pay | Admitting: Cardiology

## 2021-03-28 NOTE — Telephone Encounter (Signed)
     1. Which medications need to be refilled? (please list name of each medication and dose if known) eNTRESTO 24-26 MG   2. Which pharmacy/location (including street and city if local pharmacy) is medication to be sent to?[facility]   3. Do they need a 30 day or 90 day supply? [place]

## 2021-03-31 ENCOUNTER — Ambulatory Visit (INDEPENDENT_AMBULATORY_CARE_PROVIDER_SITE_OTHER): Payer: Medicare Other | Admitting: *Deleted

## 2021-03-31 DIAGNOSIS — I4891 Unspecified atrial fibrillation: Secondary | ICD-10-CM

## 2021-03-31 DIAGNOSIS — Z5181 Encounter for therapeutic drug level monitoring: Secondary | ICD-10-CM | POA: Diagnosis not present

## 2021-03-31 LAB — POCT INR: INR: 2.5 (ref 2.0–3.0)

## 2021-03-31 MED ORDER — ENTRESTO 24-26 MG PO TABS: 1.0000 | ORAL_TABLET | Freq: Two times a day (BID) | ORAL | 3 refills | Status: DC

## 2021-03-31 NOTE — Patient Instructions (Signed)
Spoke with pt. Instructed him to continue warfarin 4mg  daily excpet for 3mg  on Tuesday, Thursday and Saturdays. Keep K foods consistent. Recheck INR in 1 week- self tester.  He verbalized understanding.

## 2021-03-31 NOTE — Telephone Encounter (Signed)
[facility] [employer]

## 2021-04-07 ENCOUNTER — Ambulatory Visit (INDEPENDENT_AMBULATORY_CARE_PROVIDER_SITE_OTHER): Payer: Medicare Other | Admitting: *Deleted

## 2021-04-07 DIAGNOSIS — I4891 Unspecified atrial fibrillation: Secondary | ICD-10-CM

## 2021-04-07 DIAGNOSIS — Z5181 Encounter for therapeutic drug level monitoring: Secondary | ICD-10-CM

## 2021-04-07 LAB — POCT INR: INR: 3.3 — AB (ref 2.0–3.0)

## 2021-04-07 NOTE — Patient Instructions (Signed)
Spoke with pt. Instructed him to take warfarin 1mg  tonight then resume 4mg  daily excpet for 3mg  on Tuesday, Thursday and Saturdays. Keep K foods consistent. Recheck INR in 1 week- self tester.  He verbalized understanding.

## 2021-04-13 ENCOUNTER — Encounter (HOSPITAL_COMMUNITY): Payer: Self-pay | Admitting: Emergency Medicine

## 2021-04-13 ENCOUNTER — Other Ambulatory Visit: Payer: Self-pay

## 2021-04-13 ENCOUNTER — Inpatient Hospital Stay (HOSPITAL_COMMUNITY)
Admission: EM | Admit: 2021-04-13 | Discharge: 2021-04-19 | DRG: 291 | Disposition: A | Discharge status: Home Health | Payer: Medicare Other | Attending: Internal Medicine | Admitting: Internal Medicine

## 2021-04-13 ENCOUNTER — Emergency Department (HOSPITAL_COMMUNITY): Payer: Medicare Other

## 2021-04-13 DIAGNOSIS — R7989 Other specified abnormal findings of blood chemistry: Secondary | ICD-10-CM | POA: Diagnosis present

## 2021-04-13 DIAGNOSIS — M109 Gout, unspecified: Secondary | ICD-10-CM | POA: Diagnosis present

## 2021-04-13 DIAGNOSIS — G4733 Obstructive sleep apnea (adult) (pediatric): Secondary | ICD-10-CM | POA: Diagnosis present

## 2021-04-13 DIAGNOSIS — I482 Chronic atrial fibrillation, unspecified: Secondary | ICD-10-CM

## 2021-04-13 DIAGNOSIS — E669 Obesity, unspecified: Secondary | ICD-10-CM | POA: Diagnosis present

## 2021-04-13 DIAGNOSIS — Z8249 Family history of ischemic heart disease and other diseases of the circulatory system: Secondary | ICD-10-CM

## 2021-04-13 DIAGNOSIS — I5033 Acute on chronic diastolic (congestive) heart failure: Secondary | ICD-10-CM | POA: Diagnosis present

## 2021-04-13 DIAGNOSIS — Z87891 Personal history of nicotine dependence: Secondary | ICD-10-CM

## 2021-04-13 DIAGNOSIS — R0602 Shortness of breath: Secondary | ICD-10-CM | POA: Diagnosis present

## 2021-04-13 DIAGNOSIS — Z823 Family history of stroke: Secondary | ICD-10-CM

## 2021-04-13 DIAGNOSIS — I959 Hypotension, unspecified: Secondary | ICD-10-CM | POA: Diagnosis not present

## 2021-04-13 DIAGNOSIS — Z8616 Personal history of COVID-19: Secondary | ICD-10-CM

## 2021-04-13 DIAGNOSIS — W19XXXA Unspecified fall, initial encounter: Secondary | ICD-10-CM

## 2021-04-13 DIAGNOSIS — Z79890 Hormone replacement therapy (postmenopausal): Secondary | ICD-10-CM

## 2021-04-13 DIAGNOSIS — J9601 Acute respiratory failure with hypoxia: Secondary | ICD-10-CM | POA: Diagnosis present

## 2021-04-13 DIAGNOSIS — Z6839 Body mass index (BMI) 39.0-39.9, adult: Secondary | ICD-10-CM

## 2021-04-13 DIAGNOSIS — Z7951 Long term (current) use of inhaled steroids: Secondary | ICD-10-CM

## 2021-04-13 DIAGNOSIS — I130 Hypertensive heart and chronic kidney disease with heart failure and stage 1 through stage 4 chronic kidney disease, or unspecified chronic kidney disease: Principal | ICD-10-CM | POA: Diagnosis present

## 2021-04-13 DIAGNOSIS — S300XXD Contusion of lower back and pelvis, subsequent encounter: Secondary | ICD-10-CM

## 2021-04-13 DIAGNOSIS — R778 Other specified abnormalities of plasma proteins: Secondary | ICD-10-CM | POA: Diagnosis present

## 2021-04-13 DIAGNOSIS — I248 Other forms of acute ischemic heart disease: Secondary | ICD-10-CM | POA: Diagnosis present

## 2021-04-13 DIAGNOSIS — D649 Anemia, unspecified: Secondary | ICD-10-CM | POA: Diagnosis present

## 2021-04-13 DIAGNOSIS — Z8673 Personal history of transient ischemic attack (TIA), and cerebral infarction without residual deficits: Secondary | ICD-10-CM

## 2021-04-13 DIAGNOSIS — I272 Pulmonary hypertension, unspecified: Secondary | ICD-10-CM | POA: Diagnosis present

## 2021-04-13 DIAGNOSIS — S301XXD Contusion of abdominal wall, subsequent encounter: Secondary | ICD-10-CM

## 2021-04-13 DIAGNOSIS — Z951 Presence of aortocoronary bypass graft: Secondary | ICD-10-CM

## 2021-04-13 DIAGNOSIS — I2510 Atherosclerotic heart disease of native coronary artery without angina pectoris: Secondary | ICD-10-CM | POA: Diagnosis present

## 2021-04-13 DIAGNOSIS — J441 Chronic obstructive pulmonary disease with (acute) exacerbation: Secondary | ICD-10-CM | POA: Diagnosis present

## 2021-04-13 DIAGNOSIS — R58 Hemorrhage, not elsewhere classified: Secondary | ICD-10-CM | POA: Diagnosis present

## 2021-04-13 DIAGNOSIS — E118 Type 2 diabetes mellitus with unspecified complications: Secondary | ICD-10-CM | POA: Diagnosis present

## 2021-04-13 DIAGNOSIS — E1165 Type 2 diabetes mellitus with hyperglycemia: Secondary | ICD-10-CM | POA: Diagnosis present

## 2021-04-13 DIAGNOSIS — W182XXD Fall in (into) shower or empty bathtub, subsequent encounter: Secondary | ICD-10-CM

## 2021-04-13 DIAGNOSIS — Y92009 Unspecified place in unspecified non-institutional (private) residence as the place of occurrence of the external cause: Secondary | ICD-10-CM

## 2021-04-13 DIAGNOSIS — N1831 Chronic kidney disease, stage 3a: Secondary | ICD-10-CM | POA: Diagnosis present

## 2021-04-13 DIAGNOSIS — Z794 Long term (current) use of insulin: Secondary | ICD-10-CM

## 2021-04-13 DIAGNOSIS — I472 Ventricular tachycardia, unspecified: Secondary | ICD-10-CM | POA: Diagnosis not present

## 2021-04-13 DIAGNOSIS — E1169 Type 2 diabetes mellitus with other specified complication: Secondary | ICD-10-CM | POA: Diagnosis present

## 2021-04-13 DIAGNOSIS — I447 Left bundle-branch block, unspecified: Secondary | ICD-10-CM | POA: Diagnosis present

## 2021-04-13 DIAGNOSIS — I5023 Acute on chronic systolic (congestive) heart failure: Secondary | ICD-10-CM

## 2021-04-13 DIAGNOSIS — N179 Acute kidney failure, unspecified: Secondary | ICD-10-CM | POA: Diagnosis present

## 2021-04-13 DIAGNOSIS — Z885 Allergy status to narcotic agent status: Secondary | ICD-10-CM

## 2021-04-13 DIAGNOSIS — E1122 Type 2 diabetes mellitus with diabetic chronic kidney disease: Secondary | ICD-10-CM | POA: Diagnosis present

## 2021-04-13 DIAGNOSIS — J9611 Chronic respiratory failure with hypoxia: Secondary | ICD-10-CM | POA: Diagnosis present

## 2021-04-13 DIAGNOSIS — Z79899 Other long term (current) drug therapy: Secondary | ICD-10-CM

## 2021-04-13 DIAGNOSIS — Z833 Family history of diabetes mellitus: Secondary | ICD-10-CM

## 2021-04-13 DIAGNOSIS — Z7901 Long term (current) use of anticoagulants: Secondary | ICD-10-CM

## 2021-04-13 DIAGNOSIS — I4821 Permanent atrial fibrillation: Secondary | ICD-10-CM | POA: Diagnosis present

## 2021-04-13 DIAGNOSIS — I4891 Unspecified atrial fibrillation: Secondary | ICD-10-CM | POA: Diagnosis present

## 2021-04-13 DIAGNOSIS — Z9989 Dependence on other enabling machines and devices: Secondary | ICD-10-CM

## 2021-04-13 DIAGNOSIS — I509 Heart failure, unspecified: Secondary | ICD-10-CM | POA: Diagnosis not present

## 2021-04-13 DIAGNOSIS — Y92002 Bathroom of unspecified non-institutional (private) residence single-family (private) house as the place of occurrence of the external cause: Secondary | ICD-10-CM

## 2021-04-13 DIAGNOSIS — J449 Chronic obstructive pulmonary disease, unspecified: Secondary | ICD-10-CM

## 2021-04-13 DIAGNOSIS — E785 Hyperlipidemia, unspecified: Secondary | ICD-10-CM | POA: Diagnosis present

## 2021-04-13 DIAGNOSIS — I255 Ischemic cardiomyopathy: Secondary | ICD-10-CM | POA: Diagnosis present

## 2021-04-13 DIAGNOSIS — Z7984 Long term (current) use of oral hypoglycemic drugs: Secondary | ICD-10-CM

## 2021-04-13 DIAGNOSIS — Z7982 Long term (current) use of aspirin: Secondary | ICD-10-CM

## 2021-04-13 HISTORY — DX: Atherosclerotic heart disease of native coronary artery without angina pectoris: I25.10

## 2021-04-13 LAB — URINALYSIS, ROUTINE W REFLEX MICROSCOPIC
Bilirubin Urine: NEGATIVE
Glucose, UA: >=500 mg/dL — AB
Hgb urine dipstick: NEGATIVE
Ketones, ur: NEGATIVE mg/dL
Leukocytes,Ua: NEGATIVE
Nitrite: NEGATIVE
Protein, ur: NEGATIVE mg/dL
Specific Gravity, Urine: 1.01 (ref 1.005–1.030)
pH: 5 (ref 5.0–8.0)

## 2021-04-13 LAB — RESP PANEL BY RT-PCR (FLU A&B, COVID) ARPGX2
Influenza A by PCR: NEGATIVE
Influenza B by PCR: NEGATIVE
SARS Coronavirus 2 by RT PCR: NEGATIVE

## 2021-04-13 LAB — CBC WITH DIFFERENTIAL/PLATELET
Abs Immature Granulocytes: 0.06 10*3/uL (ref 0.00–0.07)
Basophils Absolute: 0 10*3/uL (ref 0.0–0.1)
Basophils Relative: 0 %
Eosinophils Absolute: 0.1 10*3/uL (ref 0.0–0.5)
Eosinophils Relative: 2 %
HCT: 40.2 % (ref 39.0–52.0)
Hemoglobin: 12.4 g/dL — ABNORMAL LOW (ref 13.0–17.0)
Immature Granulocytes: 1 %
Lymphocytes Relative: 8 %
Lymphs Abs: 0.6 10*3/uL — ABNORMAL LOW (ref 0.7–4.0)
MCH: 29.7 pg (ref 26.0–34.0)
MCHC: 30.8 g/dL (ref 30.0–36.0)
MCV: 96.4 fL (ref 80.0–100.0)
Monocytes Absolute: 0.6 10*3/uL (ref 0.1–1.0)
Monocytes Relative: 9 %
Neutro Abs: 5.4 10*3/uL (ref 1.7–7.7)
Neutrophils Relative %: 80 %
Platelets: 153 10*3/uL (ref 150–400)
RBC: 4.17 MIL/uL — ABNORMAL LOW (ref 4.22–5.81)
RDW: 17.2 % — ABNORMAL HIGH (ref 11.5–15.5)
WBC: 6.8 10*3/uL (ref 4.0–10.5)
nRBC: 0.3 % — ABNORMAL HIGH (ref 0.0–0.2)

## 2021-04-13 LAB — TROPONIN I (HIGH SENSITIVITY)
Troponin I (High Sensitivity): 127 ng/L (ref <18)
Troponin I (High Sensitivity): 118 ng/L (ref <18)

## 2021-04-13 LAB — URINALYSIS, MICROSCOPIC (REFLEX): Bacteria, UA: NONE SEEN

## 2021-04-13 LAB — BASIC METABOLIC PANEL
Anion gap: 9 (ref 5–15)
BUN: 42 mg/dL — ABNORMAL HIGH (ref 8–23)
CO2: 27 mmol/L (ref 22–32)
Calcium: 8.9 mg/dL (ref 8.9–10.3)
Chloride: 102 mmol/L (ref 98–111)
Creatinine, Ser: 1.64 mg/dL — ABNORMAL HIGH (ref 0.61–1.24)
GFR, Estimated: 44 mL/min — ABNORMAL LOW (ref >60)
Glucose, Bld: 128 mg/dL — ABNORMAL HIGH (ref 70–99)
Potassium: 4.8 mmol/L (ref 3.5–5.1)
Sodium: 138 mmol/L (ref 135–145)

## 2021-04-13 LAB — LACTIC ACID, PLASMA
Lactic Acid, Venous: 1.2 mmol/L (ref 0.5–1.9)
Lactic Acid, Venous: 1 mmol/L (ref 0.5–1.9)

## 2021-04-13 LAB — PROTIME-INR
INR: 1.8 — ABNORMAL HIGH (ref 0.8–1.2)
Prothrombin Time: 21 seconds — ABNORMAL HIGH (ref 11.4–15.2)

## 2021-04-13 LAB — BRAIN NATRIURETIC PEPTIDE: B Natriuretic Peptide: 305 pg/mL — ABNORMAL HIGH (ref 0.0–100.0)

## 2021-04-13 LAB — CBG MONITORING, ED: Glucose-Capillary: 220 mg/dL — ABNORMAL HIGH (ref 70–99)

## 2021-04-13 MED ORDER — IPRATROPIUM-ALBUTEROL 0.5-2.5 (3) MG/3ML IN SOLN
3.0000 mL | Freq: Once | RESPIRATORY_TRACT | Status: AC
  Administered 2021-04-13: 3 mL via RESPIRATORY_TRACT
  Filled 2021-04-13: qty 3

## 2021-04-13 MED ORDER — INSULIN ASPART 100 UNIT/ML IJ SOLN
0.0000 [IU] | Freq: Three times a day (TID) | INTRAMUSCULAR | Status: DC
  Administered 2021-04-14 (×3): 3 [IU] via SUBCUTANEOUS
  Administered 2021-04-15: 5 [IU] via SUBCUTANEOUS
  Administered 2021-04-15: 3 [IU] via SUBCUTANEOUS

## 2021-04-13 MED ORDER — INSULIN ASPART 100 UNIT/ML IJ SOLN
0.0000 [IU] | Freq: Every day | INTRAMUSCULAR | Status: DC
  Administered 2021-04-13 – 2021-04-14 (×2): 2 [IU] via SUBCUTANEOUS
  Filled 2021-04-13: qty 1

## 2021-04-13 MED ORDER — ASPIRIN 325 MG PO TABS
325.0000 mg | ORAL_TABLET | Freq: Once | ORAL | Status: AC
  Administered 2021-04-13: 325 mg via ORAL
  Filled 2021-04-13: qty 1

## 2021-04-13 MED ORDER — INSULIN DETEMIR 100 UNIT/ML ~~LOC~~ SOLN: 5.0000 [IU] | Freq: Every day | SUBCUTANEOUS | Status: DC

## 2021-04-13 MED ORDER — ACETAMINOPHEN 325 MG PO TABS
650.0000 mg | ORAL_TABLET | Freq: Once | ORAL | Status: AC
  Administered 2021-04-13: 650 mg via ORAL
  Filled 2021-04-13: qty 2

## 2021-04-13 MED ORDER — METHYLPREDNISOLONE SODIUM SUCC 125 MG IJ SOLR
60.0000 mg | Freq: Once | INTRAMUSCULAR | Status: AC
  Administered 2021-04-13: 60 mg via INTRAVENOUS
  Filled 2021-04-13: qty 2

## 2021-04-13 MED ORDER — ALBUTEROL SULFATE (2.5 MG/3ML) 0.083% IN NEBU
2.5000 mg | INHALATION_SOLUTION | Freq: Once | RESPIRATORY_TRACT | Status: AC
  Administered 2021-04-13: 2.5 mg via RESPIRATORY_TRACT
  Filled 2021-04-13: qty 3

## 2021-04-13 NOTE — ED Triage Notes (Addendum)
Pt presents today c/o SHOB x 1 week that is worsening, cough, and fever that started 12 hours ago. Pt has hx of CHF, taking BID tursomide. Wears oxygen 3L at home. Pt has a large amount of bruising to lower abdomen and lower back. Pt states he is on blood thinner, uses daily insulin injections which caused the abdominal bruising and fell at home several days ago which caused the bruising on his lower back,

## 2021-04-13 NOTE — H&P (Addendum)
History and Physical  Tommy Riley IDP:[id number] DOB: 07/15/1945 DOA: 04/13/2021  Referring physician:  Luna Fuse, MD PCP: Clinton Quant, MD   Patient is coming from: Home  Chief Complaint: Shortness of breath  HPI: Tommy Riley is a 75 y.o. male with medical history significant for type 2 diabetes mellitus, hyperlipidemia, CHF, CAD s/p CABG, COPD, OSA on CPAP, gout who presents to the emergency department due to 4 to 5-day onset of increasing shortness of breath which worsens with exertion, he complained of 3 to 4-day onset of nonproductive cough and endorsed fever at home yesterday (100.56F), he also states that he had some chills few days ago that self resolved within few hours.  Patient states that his symptoms were possibly related to increased fluid retention, since he has had similar symptoms in the past, so he decided to go to the ED for further evaluation and management.  Patient denies use of supplemental oxygen at baseline, but endorsed CPAP at night.  ED Course:  In the emergency department, he was tachypneic, BP was soft at 103/73, O2 sat ranged within 89-91% on room air per ED physician.  Work-up in the ED showed normocytic anemia, normal BMP except for BUN to creatinine at 42/1.64 (only creatinine on record for comparison was 6 years ago at 1.60) and CBG at 128.  Lactic acid x2 was-1.2 > 1.0, troponin x2-127 > 118.  Influenza a, B, SARS coronavirus 2 was negative. Chest x-ray showed enlargement of cardiac silhouette and no definite acute infiltrate. Patient was treated with DuoNeb and albuterol via nebulization and IV Solu-Medrol 125 mg x 1, aspirin 325 mg  x 1 was given.  Hospitalist was asked to admit.  For further evaluation and management.  Review of Systems: Constitutional: Negative for chills and fever.  HENT: Negative for ear pain and sore throat.   Eyes: Negative for pain and visual disturbance.  Respiratory: Positive for cough and shortness of breath.    Cardiovascular: Negative for chest pain and palpitations.  Gastrointestinal: Negative for abdominal pain and vomiting.  Endocrine: Negative for polyphagia and polyuria.  Genitourinary: Negative for decreased urine volume, dysuria, enuresis Musculoskeletal: Positive for increased leg swelling chronic. Negative for back pain.  Skin: Negative for color change and rash.  Allergic/Immunologic: Negative for immunocompromised state.  Neurological: Negative for tremors, syncope, speech difficulty Hematological: Does not bruise/bleed easily.  All other systems reviewed and are negative   Past Medical History:  Diagnosis Date   Anxiety    Atrial fibrillation and flutter ([place])    Atrial fibrillation ablation [facility]   Chronic systolic heart failure (HCC)    CKD (chronic kidney disease), stage III ([place])    COPD (chronic obstructive pulmonary disease) ([place])    COVID-22 February 2020   Depression    Essential hypertension    Gout    Hyperlipidemia    Ischemic heart disease    OSA (obstructive sleep apnea)    Stroke ([place]) 2003   Type 2 diabetes mellitus ([place])    Past Surgical History:  Procedure Laterality Date   ATRIAL FIBRILLATION ABLATION  2011   [facility]   CATARACT EXTRACTION Bilateral    CORONARY ARTERY BYPASS GRAFT  2001   HERNIA REPAIR Right    TONSILLECTOMY AND ADENOIDECTOMY      Social History:  reports that he quit smoking about 9 years ago. His smoking use included cigarettes. He has a 125.00 pack-year smoking history. He has never used smokeless  tobacco. He reports that he does not drink alcohol and does not use drugs.   Allergies  Allergen Reactions   Codeine Anaphylaxis    Tightness in chest    Family History  Problem Relation Age of Onset   Raynaud syndrome Mother    Stroke Father    Diabetes Mellitus II Father    Hypertension Father    Hypertension Sister    Diabetes Mellitus II Sister    Stroke Brother      Prior to Admission medications    Medication Sig Start Date End Date Taking? Authorizing Provider  albuterol (PROVENTIL) (2.5 MG/3ML) 0.083% nebulizer solution Take 3 mLs (2.5 mg total) by nebulization every 6 (six) hours as needed for wheezing or shortness of breath. 07/29/18  Yes Byrum, Rose Fillers, MD  albuterol (VENTOLIN HFA) 108 (90 Base) MCG/ACT inhaler USE 2 INHALATIONS BY MOUTH  EVERY 6 HOURS AS NEEDED FOR WHEEZING OR SHORTNESS OF  BREATH 10/07/20  Yes Byrum, Rose Fillers, MD  allopurinol (ZYLOPRIM) 100 MG tablet Take 2 tablets in the morning and 1 tablet in the evening   Yes [provider]  aspirin 81 MG tablet Take 81 mg by mouth daily.   Yes [provider]  b complex vitamins tablet Take 1 tablet by mouth daily.   Yes [provider]  Cholecalciferol (VITAMIN D3) 2000 UNITS TABS Take by mouth daily.   Yes [provider]  diphenhydramine-acetaminophen (TYLENOL PM) 25-500 MG TABS tablet Take 2 tablets every day by oral route.   Yes [provider]  ezetimibe (ZETIA) 10 MG tablet Take 10 mg by mouth daily.   Yes [provider]  FARXIGA 10 MG TABS tablet Take 1 tablet (10 mg total) by mouth daily. 12/27/20  Yes Satira Sark, MD  Fluticasone-Umeclidin-Vilant (TRELEGY ELLIPTA) 100-62.5-25 MCG/INH AEPB Inhale 1 puff into the lungs daily. Patient needs 90 day script with 3 refills. 01/28/21  Yes Collene Gobble, MD  folic acid (FOLVITE) 182 MCG tablet Take 400 mcg by mouth daily.   Yes [provider]  guaiFENesin (MUCINEX) 600 MG 12 hr tablet Take 1,200 mg by mouth 2 (two) times daily.   Yes [provider]  HUMALOG KWIKPEN 200 UNIT/ML KwikPen Inject 20 Units into the skin every evening. 11/11/20  Yes [provider]  insulin detemir (LEVEMIR) 100 UNIT/ML injection Inject 30 Units into the skin daily.   Yes [provider]  loratadine (CLARITIN) 10 MG tablet Take 10 mg by mouth daily.   Yes [provider]  MELATONIN PO Take 6 mg by  mouth daily.   Yes [provider]  metoprolol succinate (TOPROL-XL) 50 MG 24 hr tablet Take 50 mg by mouth 2 (two) times daily. Take 1 tablet 2 times daily   Yes [provider]  Multiple Vitamins-Minerals (CENTRUM SILVER PO) Take 1 tablet by mouth daily.    Yes [provider]  Nutritional Supplements (COLD AND FLU PO) Take 1 tablet by mouth daily as needed (cough aches).   Yes [provider]  omega-3 acid ethyl esters (LOVAZA) 1 G capsule Take by mouth in the morning, at noon, and at bedtime.   Yes [provider]  oxymetazoline (AFRIN) 0.05 % nasal spray Place 1 spray into both nostrils 2 (two) times daily.   Yes [provider]  Potassium Chloride (KLOR-CON 10 PO) Take 20 mg by mouth in the morning, at noon, and at bedtime. Extended release   Yes [provider]  rosuvastatin (CRESTOR) 40 MG tablet Take 40 mg by mouth daily.   Yes [provider]  sacubitril-valsartan (ENTRESTO) 24-26 MG Take 1 tablet by mouth 2 (two) times daily. 03/31/21  Yes Satira Sark, MD  SERTRALINE HCL PO Take 150 mg by mouth daily.   Yes [provider]  spironolactone (ALDACTONE) 100 MG tablet Take 100 mg by mouth daily.   Yes [provider]  torsemide (DEMADEX) 20 MG tablet Take 40 mg by mouth 2 (two) times daily.   Yes [provider]  vitamin C (ASCORBIC ACID) 500 MG tablet Take 500 mg by mouth daily.   Yes [provider]  warfarin (COUMADIN) 3 MG tablet Take 3 mg by mouth daily. And take 30m on Sun, Monday, Wednesday and Friday 3 mg all other days   Yes [provider]  Zinc Methionate 50 MG CAPS Take 1 capsule by mouth daily.   Yes [provider]  ACCU-CHEK AVIVA PLUS test strip  07/09/14   [provider]  Blood Glucose Monitoring Suppl (ONE TOUCH ULTRA 2) W/DEVICE KIT by Does not apply route.    [provider]  insulin aspart (NOVOLOG) 100 UNIT/ML injection  Inject into the skin daily with breakfast. Up to 20 units Patient not taking: No sig reported    [provider]  insulin lispro (HUMALOG) 100 UNIT/ML injection Inject into the skin. Patient not taking: No sig reported    [provider]    Physical Exam: BP (!) 102/58   Pulse 91   Temp 99 F (37.2 C) (Oral)   Resp (!) 25   SpO2 98%   General: 75y.o. year-old male well developed well nourished in no acute distress.  Alert and oriented x3. HEENT: NCAT, EOMI Neck: Supple, trachea medial Cardiovascular: Bilateral Rales in lower lobes.  Irregular rate and rhythm with no rubs or gallops.  No thyromegaly.  2/4 pulses in all 4 extremities. Respiratory: Tachypnea.  Clear to auscultation with no wheezes or rales. Good inspiratory effort. Abdomen: Soft, nontender nondistended with normal bowel sounds x4 quadrants. Muskuloskeletal: Bilateral lower extremity edema.  No cyanosis or clubbing  noted bilaterally Neuro: CN II-XII intact, strength 5/5 x 4, sensation, reflexes intact Skin: No ulcerative lesions noted or rashes Psychiatry: Mood is appropriate for condition and setting          Labs on Admission:  Basic Metabolic Panel: Recent Labs  Lab 04/13/21 1724  NA 138  K 4.8  CL 102  CO2 27  GLUCOSE 128*  BUN 42*  CREATININE 1.64*  CALCIUM 8.9   Liver Function Tests: No results for input(s): AST, ALT, ALKPHOS, BILITOT, PROT, ALBUMIN in the last 168 hours. No results for input(s): LIPASE, AMYLASE in the last 168 hours. No results for input(s): AMMONIA in the last 168 hours. CBC: Recent Labs  Lab 04/13/21 1724  WBC 6.8  NEUTROABS 5.4  HGB 12.4*  HCT 40.2  MCV 96.4  PLT 153   Cardiac Enzymes: No results for input(s): CKTOTAL, CKMB, CKMBINDEX, TROPONINI in the last 168 hours.  BNP (last 3 results) Recent Labs    04/13/21 1724  BNP 305.0*    ProBNP (last 3 results) No results for input(s): PROBNP in the last 8760 hours.  CBG: No results for  input(s): GLUCAP in the last 168 hours.  Radiological Exams on Admission: DG Chest Port 1 View  Result Date: 04/13/2021 CLINICAL DATA:  Shortness of breath for 1 week that is worsening, cough, and fever  starting 12 hours ago, history CHF, COPD, former smoker EXAM: PORTABLE CHEST 1 VIEW COMPARISON:  Portable exam 1733 hours compared to 04/09/2021 FINDINGS: Enlargement of cardiac silhouette post median sternotomy. Mediastinal contours and pulmonary vascularity normal. Atherosclerotic calcification aorta. Slight chronic accentuation of interstitial markings, stable. No acute infiltrate, pleural effusion, or pneumothorax. Osseous structures unremarkable. IMPRESSION: Enlargement of cardiac silhouette. No definite acute infiltrate. Electronically Signed   By: Lavonia Dana M.D.   On: 04/13/2021 18:00    EKG: I independently viewed the EKG done and my findings are as followed: A. fib with rate control  Assessment/Plan Present on Admission:  Atrial fibrillation ([place])  Principal Problem:   Acute exacerbation of CHF (congestive heart failure) (HCC) Active Problems:   COPD (chronic obstructive pulmonary disease) (HCC)   S/P CABG (coronary artery bypass graft)   Atrial fibrillation (HCC)   OSA on CPAP   Elevated brain natriuretic peptide (BNP) level   Elevated troponin I level   Hyperglycemia due to diabetes mellitus (HCC)   Acute respiratory failure with hypoxia (HCC)   Acute respiratory failure with hypoxia possibly secondary to CHF exacerbation Patient complained of shortness of breath on exertion and presents with bilateral lower extremity edema as well as increased abdominal girth. Aspirin 325 mg p.o. x1 was given Continue total input/output, daily weights and fluid restriction IV Lasix temporarily held due to soft BP Entresto and Spironolactone will be held at this time due to soft BP Continue Cardiac diet  Echocardiogram in the morning  Continue supplemental oxygen to maintain O2 sat >  92%, with plan to wean patient off supplemental oxygen as tolerated (patient does not use supplemental oxygen at baseline).  Elevated BNP (305) This may not be reliable considering patient's elevated creatinine Continue management as described above  Elevated troponin possibly secondary to type II demand ischemia Troponin x 2 - 127 > 118, patient denies any chest pain Continue telemetry and continue to monitor patient and treat accordingly  Elevated BUN/creatinine with no known history of CKD BUN to creatinine at 42/1.64 (only creatinine on record for comparison was 6 years ago at 1.60) Renally adjust medications, avoid nephrotoxic agents/dehydration/hypotension  Hyperglycemia secondary to type 2 diabetes mellitus Continue ISS and hypoglycemic protocol Continue Levemir 5 units daily at this time and titrate as needed while inpatient  Abdominal and lower back/gluteal ecchymosis secondary to a recent fall Patient presents with lower back/gluteal ecchymosis sustained status post fall at home, he states that he has been to the hospital where imaging studies showed no fracture. Continue to monitor and treat accordingly Continue fall precautions and neurochecks  Atrial fibrillation with rate controlled CHA2DS2-VASc score = 4, this is equal to about 4.8% stroke risk per year Toprol XL will be temporarily held due to soft BP Continue warfarin per home regimen; INR currently at 1.8, continue to monitor INR  CAD s/p CABG Continue aspirin, Crestor  OSA on CPAP Continue CPAP  COPD (not in acute exacerbation) Continue albuterol inhaler and Trelegy Ellipta  Essential hypertension BP meds will be held at this time due to soft BP  Hyperlipidemia Continue Zetia, Crestor and Lovaza  DVT prophylaxis: Warfarin  Code Status: Full code  Family Communication: None at bedside  Disposition Plan:  Patient is from:                        home Anticipated DC to:  SNF or family  members home Anticipated DC date:               2-3 days Anticipated DC barriers:          Patient requires inpatient management due to hypoxia secondary to acute CHF exacerbation    Consults called: None  Admission status: Observation   Bernadette Hoit MD [facility]  04/13/2021, 11:25 PM

## 2021-04-13 NOTE — ED Notes (Signed)
While MD was in the room for exam, he removed pt from O2 to see what his o2 levels would be on RA, pt droppped to 92% on RA. MD notified. Pt placed on 2L via [place]

## 2021-04-13 NOTE — ED Provider Notes (Signed)
[facility] EMERGENCY DEPARTMENT Provider Note   CSN: [id number] Arrival date & time: 04/13/21  1645     History Chief Complaint  Patient presents with   Shortness of Breath    Tommy Riley is a 75 y.o. male.  Patient presents chief complaint of shortness of breath ongoing for a week.  He states he had fevers yesterday T-max of 100.8.  Has had a cough for 3 to 4 days as well.  He denies any headache or chest pain denies abdominal pain.  Despite triage notation, he states he does not use supplemental oxygen during the day, however he does use CPAP at nighttime.      Past Medical History:  Diagnosis Date   Anxiety    Atrial fibrillation and flutter ([place])    Atrial fibrillation ablation 2011 at [facility]   Chronic systolic heart failure (HCC)    CKD (chronic kidney disease), stage III (HCC)    COPD (chronic obstructive pulmonary disease) ([place])    COVID-22 February 2020   Depression    Essential hypertension    Gout    Hyperlipidemia    Ischemic heart disease    OSA (obstructive sleep apnea)    Stroke ([place]) 2003   Type 2 diabetes mellitus ([place])     Patient Active Problem List   Diagnosis Date Noted   Encounter for therapeutic drug monitoring 12/09/2020   Pneumonia due to COVID-19 virus 04/04/2020   Chronic respiratory failure with hypoxia ([place]) 08/29/2018   Other secondary pulmonary hypertension ([place]) 07/29/2018   Cough 07/29/2018   Non-nicotine vapor product user 07/29/2018   COPD (chronic obstructive pulmonary disease) with chronic bronchitis ([place]) 12/14/2014   ASHD (arteriosclerotic heart disease) 12/14/2014   S/P CABG (coronary artery bypass graft) 12/14/2014   Atrial fibrillation ([place]) 12/14/2014   HBP (high blood pressure) 12/14/2014   CHF (congestive heart failure) ([place]) 12/14/2014   Obesity 12/14/2014   OSA on CPAP 12/14/2014   Diabetes mellitus type 2 with complications ([place]) 01/00/7121    Past Surgical History:  Procedure Laterality Date   ATRIAL  FIBRILLATION ABLATION  2011   [facility]   CATARACT EXTRACTION Bilateral    CORONARY ARTERY BYPASS GRAFT  2001   HERNIA REPAIR Right    TONSILLECTOMY AND ADENOIDECTOMY         Family History  Problem Relation Age of Onset   Raynaud syndrome Mother    Stroke Father    Diabetes Mellitus II Father    Hypertension Father    Hypertension Sister    Diabetes Mellitus II Sister    Stroke Brother     Social History   Tobacco Use   Smoking status: Former    Packs/day: 2.50    Years: 50.00    Pack years: 125.00    Types: Cigarettes    Quit date: 06/17/2011    Years since quitting: 9.8   Smokeless tobacco: Never  Vaping Use   Vaping Use: Some days  Substance Use Topics   Alcohol use: No    Alcohol/week: 0.0 standard drinks   Drug use: No    Home Medications Prior to Admission medications   Medication Sig Start Date End Date Taking? Authorizing Provider  albuterol (PROVENTIL) (2.5 MG/3ML) 0.083% nebulizer solution Take 3 mLs (2.5 mg total) by nebulization every 6 (six) hours as needed for wheezing or shortness of breath. 07/29/18  Yes Byrum, Rose Fillers, MD  albuterol (VENTOLIN HFA) 108 (90 Base) MCG/ACT inhaler USE 2 INHALATIONS BY MOUTH  EVERY 6 HOURS AS NEEDED FOR WHEEZING OR SHORTNESS OF  BREATH 10/07/20  Yes Byrum, Rose Fillers, MD  allopurinol (ZYLOPRIM) 100 MG tablet Take 2 tablets in the morning and 1 tablet in the evening   Yes [provider]  aspirin 81 MG tablet Take 81 mg by mouth daily.   Yes [provider]  b complex vitamins tablet Take 1 tablet by mouth daily.   Yes [provider]  Cholecalciferol (VITAMIN D3) 2000 UNITS TABS Take by mouth daily.   Yes [provider]  diphenhydramine-acetaminophen (TYLENOL PM) 25-500 MG TABS tablet Take 2 tablets every day by oral route.   Yes [provider]  ezetimibe (ZETIA) 10 MG tablet Take 10 mg by mouth daily.   Yes [provider]  FARXIGA 10 MG TABS tablet Take 1 tablet (10 mg  total) by mouth daily. 12/27/20  Yes Satira Sark, MD  Fluticasone-Umeclidin-Vilant (TRELEGY ELLIPTA) 100-62.5-25 MCG/INH AEPB Inhale 1 puff into the lungs daily. Patient needs 90 day script with 3 refills. 01/28/21  Yes Collene Gobble, MD  folic acid (FOLVITE) 622 MCG tablet Take 400 mcg by mouth daily.   Yes [provider]  guaiFENesin (MUCINEX) 600 MG 12 hr tablet Take 1,200 mg by mouth 2 (two) times daily.   Yes [provider]  HUMALOG KWIKPEN 200 UNIT/ML KwikPen Inject 20 Units into the skin every evening. 11/11/20  Yes [provider]  insulin detemir (LEVEMIR) 100 UNIT/ML injection Inject 30 Units into the skin daily.   Yes [provider]  loratadine (CLARITIN) 10 MG tablet Take 10 mg by mouth daily.   Yes [provider]  MELATONIN PO Take 6 mg by mouth daily.   Yes [provider]  metoprolol succinate (TOPROL-XL) 50 MG 24 hr tablet Take 50 mg by mouth 2 (two) times daily. Take 1 tablet 2 times daily   Yes [provider]  Multiple Vitamins-Minerals (CENTRUM SILVER PO) Take 1 tablet by mouth daily.    Yes [provider]  Nutritional Supplements (COLD AND FLU PO) Take 1 tablet by mouth daily as needed (cough aches).   Yes [provider]  omega-3 acid ethyl esters (LOVAZA) 1 G capsule Take by mouth in the morning, at noon, and at bedtime.   Yes [provider]  oxymetazoline (AFRIN) 0.05 % nasal spray Place 1 spray into both nostrils 2 (two) times daily.   Yes [provider]  Potassium Chloride (KLOR-CON 10 PO) Take 20 mg by mouth in the morning, at noon, and at bedtime. Extended release   Yes [provider]  rosuvastatin (CRESTOR) 40 MG tablet Take 40 mg by mouth daily.   Yes [provider]  sacubitril-valsartan (ENTRESTO) 24-26 MG Take 1 tablet by mouth 2 (two) times daily. 03/31/21  Yes Satira Sark, MD  SERTRALINE HCL PO Take 150 mg by mouth daily.   Yes  [provider]  spironolactone (ALDACTONE) 100 MG tablet Take 100 mg by mouth daily.   Yes [provider]  torsemide (DEMADEX) 20 MG tablet Take 40 mg by mouth 2 (two) times daily.   Yes [provider]  vitamin C (ASCORBIC ACID) 500 MG tablet Take 500 mg by mouth daily.   Yes [provider]  warfarin (COUMADIN) 3 MG tablet Take 3 mg by mouth daily. And take 73m on Sun, Monday, Wednesday and Friday 3 mg all other days   Yes [provider]  Zinc Methionate 50  MG CAPS Take 1 capsule by mouth daily.   Yes [provider]  ACCU-CHEK AVIVA PLUS test strip  07/09/14   [provider]  Blood Glucose Monitoring Suppl (ONE TOUCH ULTRA 2) W/DEVICE KIT by Does not apply route.    [provider]  insulin aspart (NOVOLOG) 100 UNIT/ML injection Inject into the skin daily with breakfast. Up to 20 units Patient not taking: No sig reported    [provider]  insulin lispro (HUMALOG) 100 UNIT/ML injection Inject into the skin. Patient not taking: No sig reported    [provider]    Allergies    Codeine  Review of Systems   Review of Systems  Constitutional:  Negative for fever.  HENT:  Negative for ear pain and sore throat.   Eyes:  Negative for pain.  Respiratory:  Positive for cough and shortness of breath.   Cardiovascular:  Negative for chest pain.  Gastrointestinal:  Negative for abdominal pain.  Genitourinary:  Negative for flank pain.  Musculoskeletal:  Negative for back pain.  Skin:  Negative for color change and rash.  Neurological:  Negative for syncope.  All other systems reviewed and are negative.  Physical Exam Updated Vital Signs BP 102/81   Pulse (!) 116   Temp 99 F (37.2 C) (Oral)   Resp (!) 23   SpO2 100%   Physical Exam Constitutional:      Appearance: He is well-developed.  HENT:     Head: Normocephalic.     Nose: Nose normal.  Eyes:     Extraocular Movements: Extraocular  movements intact.  Cardiovascular:     Rate and Rhythm: Normal rate.  Pulmonary:     Effort: Tachypnea present.     Breath sounds: Decreased breath sounds present. No wheezing.  Musculoskeletal:     Right lower leg: Edema present.     Left lower leg: Edema present.  Skin:    Coloration: Skin is not jaundiced.  Neurological:     Mental Status: He is alert. Mental status is at baseline.    ED Results / Procedures / Treatments   Labs (all labs ordered are listed, but only abnormal results are displayed) Labs Reviewed  CBC WITH DIFFERENTIAL/PLATELET - Abnormal; Notable for the following components:      Result Value   RBC 4.17 (*)    Hemoglobin 12.4 (*)    RDW 17.2 (*)    nRBC 0.3 (*)    Lymphs Abs 0.6 (*)    All other components within normal limits  BASIC METABOLIC PANEL - Abnormal; Notable for the following components:   Glucose, Bld 128 (*)    BUN 42 (*)    Creatinine, Ser 1.64 (*)    GFR, Estimated 44 (*)    All other components within normal limits  BRAIN NATRIURETIC PEPTIDE - Abnormal; Notable for the following components:   B Natriuretic Peptide 305.0 (*)    All other components within normal limits  TROPONIN I (HIGH SENSITIVITY) - Abnormal; Notable for the following components:   Troponin I (High Sensitivity) 127 (*)    All other components within normal limits  TROPONIN I (HIGH SENSITIVITY) - Abnormal; Notable for the following components:   Troponin I (High Sensitivity) 118 (*)    All other components within normal limits  CULTURE, BLOOD (ROUTINE X 2)  CULTURE, BLOOD (ROUTINE X 2)  RESP PANEL BY RT-PCR (FLU A&B, COVID) ARPGX2  LACTIC ACID, PLASMA  LACTIC ACID, PLASMA  PROTIME-INR  URINALYSIS, ROUTINE  W REFLEX MICROSCOPIC    EKG EKG Interpretation  Date/Time:  Sunday April 13 2021 17:01:22 EDT Ventricular Rate:  88 PR Interval:    QRS Duration: 151 QT Interval:  386 QTC Calculation: 467 R Axis:   125 Text Interpretation: Atrial fibrillation  Nonspecific intraventricular conduction delay Repol abnrm suggests ischemia, diffuse leads Baseline wander in lead(s) V2 Confirmed by Thamas Jaegers ([id number]) on 04/13/2021 8:32:36 PM  Radiology DG Chest Port 1 View  Result Date: 04/13/2021 CLINICAL DATA:  Shortness of breath for 1 week that is worsening, cough, and fever starting 12 hours ago, history CHF, COPD, former smoker EXAM: PORTABLE CHEST 1 VIEW COMPARISON:  Portable exam 1733 hours compared to 04/09/2021 FINDINGS: Enlargement of cardiac silhouette post median sternotomy. Mediastinal contours and pulmonary vascularity normal. Atherosclerotic calcification aorta. Slight chronic accentuation of interstitial markings, stable. No acute infiltrate, pleural effusion, or pneumothorax. Osseous structures unremarkable. IMPRESSION: Enlargement of cardiac silhouette. No definite acute infiltrate. Electronically Signed   By: Lavonia Dana M.D.   On: 04/13/2021 18:00    Procedures .Critical Care Performed by: Luna Fuse, MD Authorized by: Luna Fuse, MD   Critical care provider statement:    Critical care time (minutes):  40   Critical care time was exclusive of:  Teaching time   Critical care was necessary to treat or prevent imminent or life-threatening deterioration of the following conditions:  Cardiac failure and respiratory failure Comments:     Hypoxemia, elevated troponin, requiring multiple breathing treatments.   Medications Ordered in ED Medications  albuterol (PROVENTIL) (2.5 MG/3ML) 0.083% nebulizer solution 2.5 mg (has no administration in time range)  acetaminophen (TYLENOL) tablet 650 mg (650 mg Oral Given 04/13/21 1730)  methylPREDNISolone sodium succinate (SOLU-MEDROL) 125 mg/2 mL injection 60 mg (60 mg Intravenous Given 04/13/21 1815)  ipratropium-albuterol (DUONEB) 0.5-2.5 (3) MG/3ML nebulizer solution 3 mL (3 mLs Nebulization Given 04/13/21 1821)  aspirin tablet 325 mg (325 mg Oral Given 04/13/21 1823)    ED Course  I have  reviewed the triage vital signs and the nursing notes.  Pertinent labs & imaging results that were available during my care of the patient were reviewed by me and considered in my medical decision making (see chart for details).    MDM Rules/Calculators/A&P                           Labs show white count of 6 hemoglobin unremarkable chemistry unremarkable compared to baseline levels of creatinine as well.  Lactic acid is normal.  Initial troponin is elevated at 125.  EKG shows atrial fibrillation and normal rate.  No ST elevations depressions noted.  Patient denies any chest pain or chest discomfort.  Repeat troponin is trending downwards.  Patient on room air becomes hypoxemic to 89%, doing well on 2 L nasal cannula.  Patient given albuterol and Solu-Medrol.  Etiology of symptoms may be multifactorial with a baseline congestive heart failure and COPD.  proBNP is relatively normal and chest x-ray is unremarkable for pulmonary edema.  Given breathing treatments with only mild improvement and still hypoxemic.  Patient states his last cardiac echo was about 3 years ago in [place].  Will be brought into the hospitalist team.   Final Clinical Impression(s) / ED Diagnoses Final diagnoses:  Shortness of breath  COPD exacerbation (HCC)  Congestive heart failure, unspecified HF chronicity, unspecified heart failure type ([place])    Rx / DC Orders ED Discharge Orders  None        Luna Fuse, MD 04/13/21 2050

## 2021-04-13 NOTE — ED Notes (Signed)
Critical: Troponin 127.  Provider made aware.

## 2021-04-13 NOTE — ED Notes (Addendum)
Patient has extensive bruising around abdomen, he states this is from his insulin. Patient also has bruising around left side and covering lower back. Patient states this is from a recent fall. Also has bruising to forearms.

## 2021-04-14 ENCOUNTER — Observation Stay (HOSPITAL_COMMUNITY): Payer: Medicare Other

## 2021-04-14 DIAGNOSIS — Z8616 Personal history of COVID-19: Secondary | ICD-10-CM | POA: Diagnosis not present

## 2021-04-14 DIAGNOSIS — E1165 Type 2 diabetes mellitus with hyperglycemia: Secondary | ICD-10-CM | POA: Diagnosis present

## 2021-04-14 DIAGNOSIS — E1122 Type 2 diabetes mellitus with diabetic chronic kidney disease: Secondary | ICD-10-CM | POA: Diagnosis present

## 2021-04-14 DIAGNOSIS — N179 Acute kidney failure, unspecified: Secondary | ICD-10-CM | POA: Diagnosis present

## 2021-04-14 DIAGNOSIS — Z6839 Body mass index (BMI) 39.0-39.9, adult: Secondary | ICD-10-CM | POA: Diagnosis not present

## 2021-04-14 DIAGNOSIS — E118 Type 2 diabetes mellitus with unspecified complications: Secondary | ICD-10-CM | POA: Diagnosis not present

## 2021-04-14 DIAGNOSIS — I130 Hypertensive heart and chronic kidney disease with heart failure and stage 1 through stage 4 chronic kidney disease, or unspecified chronic kidney disease: Secondary | ICD-10-CM | POA: Diagnosis present

## 2021-04-14 DIAGNOSIS — D649 Anemia, unspecified: Secondary | ICD-10-CM | POA: Diagnosis present

## 2021-04-14 DIAGNOSIS — W19XXXA Unspecified fall, initial encounter: Secondary | ICD-10-CM | POA: Diagnosis not present

## 2021-04-14 DIAGNOSIS — Z87891 Personal history of nicotine dependence: Secondary | ICD-10-CM | POA: Diagnosis not present

## 2021-04-14 DIAGNOSIS — I2510 Atherosclerotic heart disease of native coronary artery without angina pectoris: Secondary | ICD-10-CM | POA: Diagnosis present

## 2021-04-14 DIAGNOSIS — I482 Chronic atrial fibrillation, unspecified: Secondary | ICD-10-CM | POA: Diagnosis not present

## 2021-04-14 DIAGNOSIS — I5033 Acute on chronic diastolic (congestive) heart failure: Secondary | ICD-10-CM | POA: Diagnosis present

## 2021-04-14 DIAGNOSIS — E669 Obesity, unspecified: Secondary | ICD-10-CM | POA: Diagnosis present

## 2021-04-14 DIAGNOSIS — I255 Ischemic cardiomyopathy: Secondary | ICD-10-CM | POA: Diagnosis present

## 2021-04-14 DIAGNOSIS — E785 Hyperlipidemia, unspecified: Secondary | ICD-10-CM | POA: Diagnosis present

## 2021-04-14 DIAGNOSIS — I472 Ventricular tachycardia, unspecified: Secondary | ICD-10-CM | POA: Diagnosis not present

## 2021-04-14 DIAGNOSIS — J9611 Chronic respiratory failure with hypoxia: Secondary | ICD-10-CM

## 2021-04-14 DIAGNOSIS — G4733 Obstructive sleep apnea (adult) (pediatric): Secondary | ICD-10-CM | POA: Diagnosis present

## 2021-04-14 DIAGNOSIS — I509 Heart failure, unspecified: Secondary | ICD-10-CM | POA: Diagnosis not present

## 2021-04-14 DIAGNOSIS — R0602 Shortness of breath: Secondary | ICD-10-CM | POA: Diagnosis present

## 2021-04-14 DIAGNOSIS — W182XXD Fall in (into) shower or empty bathtub, subsequent encounter: Secondary | ICD-10-CM | POA: Diagnosis not present

## 2021-04-14 DIAGNOSIS — Z8673 Personal history of transient ischemic attack (TIA), and cerebral infarction without residual deficits: Secondary | ICD-10-CM | POA: Diagnosis not present

## 2021-04-14 DIAGNOSIS — N1831 Chronic kidney disease, stage 3a: Secondary | ICD-10-CM | POA: Diagnosis present

## 2021-04-14 DIAGNOSIS — I272 Pulmonary hypertension, unspecified: Secondary | ICD-10-CM | POA: Diagnosis present

## 2021-04-14 DIAGNOSIS — R778 Other specified abnormalities of plasma proteins: Secondary | ICD-10-CM | POA: Diagnosis not present

## 2021-04-14 DIAGNOSIS — I959 Hypotension, unspecified: Secondary | ICD-10-CM | POA: Diagnosis not present

## 2021-04-14 DIAGNOSIS — I4821 Permanent atrial fibrillation: Secondary | ICD-10-CM | POA: Diagnosis present

## 2021-04-14 DIAGNOSIS — J441 Chronic obstructive pulmonary disease with (acute) exacerbation: Secondary | ICD-10-CM | POA: Diagnosis present

## 2021-04-14 DIAGNOSIS — Z951 Presence of aortocoronary bypass graft: Secondary | ICD-10-CM | POA: Diagnosis not present

## 2021-04-14 DIAGNOSIS — I248 Other forms of acute ischemic heart disease: Secondary | ICD-10-CM | POA: Diagnosis present

## 2021-04-14 DIAGNOSIS — Y92002 Bathroom of unspecified non-institutional (private) residence single-family (private) house as the place of occurrence of the external cause: Secondary | ICD-10-CM | POA: Diagnosis not present

## 2021-04-14 DIAGNOSIS — J9601 Acute respiratory failure with hypoxia: Secondary | ICD-10-CM | POA: Diagnosis present

## 2021-04-14 DIAGNOSIS — R7989 Other specified abnormal findings of blood chemistry: Secondary | ICD-10-CM | POA: Diagnosis not present

## 2021-04-14 DIAGNOSIS — J449 Chronic obstructive pulmonary disease, unspecified: Secondary | ICD-10-CM | POA: Diagnosis not present

## 2021-04-14 DIAGNOSIS — I5023 Acute on chronic systolic (congestive) heart failure: Secondary | ICD-10-CM | POA: Diagnosis not present

## 2021-04-14 LAB — CBC
HCT: 40.7 % (ref 39.0–52.0)
Hemoglobin: 12.6 g/dL — ABNORMAL LOW (ref 13.0–17.0)
MCH: 29.7 pg (ref 26.0–34.0)
MCHC: 31 g/dL (ref 30.0–36.0)
MCV: 96 fL (ref 80.0–100.0)
Platelets: 161 10*3/uL (ref 150–400)
RBC: 4.24 MIL/uL (ref 4.22–5.81)
RDW: 17.1 % — ABNORMAL HIGH (ref 11.5–15.5)
WBC: 7 10*3/uL (ref 4.0–10.5)
nRBC: 0.3 % — ABNORMAL HIGH (ref 0.0–0.2)

## 2021-04-14 LAB — ECHOCARDIOGRAM COMPLETE
AR max vel: 2.3 cm2
AV Area VTI: 2.55 cm2
AV Area mean vel: 2.39 cm2
AV Mean grad: 3.7 mmHg
AV Peak grad: 7.9 mmHg
Ao pk vel: 1.41 m/s
Area-P 1/2: 4.15 cm2
MV VTI: 1.91 cm2
S' Lateral: 4.55 cm

## 2021-04-14 LAB — COMPREHENSIVE METABOLIC PANEL
ALT: 44 U/L (ref 0–44)
AST: 34 U/L (ref 15–41)
Albumin: 3.6 g/dL (ref 3.5–5.0)
Alkaline Phosphatase: 87 U/L (ref 38–126)
Anion gap: 11 (ref 5–15)
BUN: 43 mg/dL — ABNORMAL HIGH (ref 8–23)
CO2: 25 mmol/L (ref 22–32)
Calcium: 9 mg/dL (ref 8.9–10.3)
Chloride: 101 mmol/L (ref 98–111)
Creatinine, Ser: 1.62 mg/dL — ABNORMAL HIGH (ref 0.61–1.24)
GFR, Estimated: 45 mL/min — ABNORMAL LOW (ref >60)
Glucose, Bld: 234 mg/dL — ABNORMAL HIGH (ref 70–99)
Potassium: 4.8 mmol/L (ref 3.5–5.1)
Sodium: 137 mmol/L (ref 135–145)
Total Bilirubin: 1.3 mg/dL — ABNORMAL HIGH (ref 0.3–1.2)
Total Protein: 6.9 g/dL (ref 6.5–8.1)

## 2021-04-14 LAB — MAGNESIUM: Magnesium: 2.3 mg/dL (ref 1.7–2.4)

## 2021-04-14 LAB — HEMOGLOBIN A1C
Hgb A1c MFr Bld: 7 % — ABNORMAL HIGH (ref 4.8–5.6)
Mean Plasma Glucose: 154.2 mg/dL

## 2021-04-14 LAB — GLUCOSE, CAPILLARY
Glucose-Capillary: 229 mg/dL — ABNORMAL HIGH (ref 70–99)
Glucose-Capillary: 214 mg/dL — ABNORMAL HIGH (ref 70–99)
Glucose-Capillary: 210 mg/dL — ABNORMAL HIGH (ref 70–99)
Glucose-Capillary: 203 mg/dL — ABNORMAL HIGH (ref 70–99)
Glucose-Capillary: 232 mg/dL — ABNORMAL HIGH (ref 70–99)

## 2021-04-14 LAB — PROTIME-INR
INR: 1.7 — ABNORMAL HIGH (ref 0.8–1.2)
Prothrombin Time: 19.9 seconds — ABNORMAL HIGH (ref 11.4–15.2)

## 2021-04-14 LAB — PHOSPHORUS: Phosphorus: 3.7 mg/dL (ref 2.5–4.6)

## 2021-04-14 LAB — APTT: aPTT: 32 seconds (ref 24–36)

## 2021-04-14 MED ORDER — OMEGA-3-ACID ETHYL ESTERS 1 G PO CAPS
1.0000 g | ORAL_CAPSULE | Freq: Every day | ORAL | Status: DC
  Administered 2021-04-14 – 2021-04-18 (×6): 1 g via ORAL
  Filled 2021-04-14 (×6): qty 1

## 2021-04-14 MED ORDER — FLUTICASONE FUROATE-VILANTEROL 100-25 MCG/INH IN AEPB
1.0000 | INHALATION_SPRAY | Freq: Every day | RESPIRATORY_TRACT | Status: DC
  Administered 2021-04-14 – 2021-04-19 (×6): 1 via RESPIRATORY_TRACT
  Filled 2021-04-14: qty 28

## 2021-04-14 MED ORDER — EZETIMIBE 10 MG PO TABS
10.0000 mg | ORAL_TABLET | Freq: Every day | ORAL | Status: DC
  Administered 2021-04-14 – 2021-04-19 (×6): 10 mg via ORAL
  Filled 2021-04-14 (×6): qty 1

## 2021-04-14 MED ORDER — METOPROLOL TARTRATE 25 MG PO TABS
25.0000 mg | ORAL_TABLET | Freq: Two times a day (BID) | ORAL | Status: DC
  Administered 2021-04-14 – 2021-04-16 (×6): 25 mg via ORAL
  Filled 2021-04-14 (×6): qty 1

## 2021-04-14 MED ORDER — WARFARIN SODIUM 2 MG PO TABS: 3.0000 mg | ORAL_TABLET | Freq: Every day | ORAL | Status: DC

## 2021-04-14 MED ORDER — ROSUVASTATIN CALCIUM 20 MG PO TABS
40.0000 mg | ORAL_TABLET | Freq: Every day | ORAL | Status: DC
  Administered 2021-04-14: 40 mg via ORAL
  Administered 2021-04-14: 20 mg via ORAL
  Administered 2021-04-15 – 2021-04-18 (×4): 40 mg via ORAL
  Filled 2021-04-14 (×6): qty 2

## 2021-04-14 MED ORDER — ASPIRIN 81 MG PO CHEW
81.0000 mg | CHEWABLE_TABLET | Freq: Every day | ORAL | Status: DC
  Administered 2021-04-14 – 2021-04-19 (×6): 81 mg via ORAL
  Filled 2021-04-14 (×6): qty 1

## 2021-04-14 MED ORDER — UMECLIDINIUM BROMIDE 62.5 MCG/INH IN AEPB
1.0000 | INHALATION_SPRAY | Freq: Every day | RESPIRATORY_TRACT | Status: DC
  Administered 2021-04-14 – 2021-04-19 (×6): 1 via RESPIRATORY_TRACT
  Filled 2021-04-14: qty 7

## 2021-04-14 MED ORDER — ALBUTEROL SULFATE (2.5 MG/3ML) 0.083% IN NEBU
3.0000 mL | INHALATION_SOLUTION | Freq: Four times a day (QID) | RESPIRATORY_TRACT | Status: DC | PRN
  Administered 2021-04-14 – 2021-04-15 (×4): 3 mL via RESPIRATORY_TRACT
  Filled 2021-04-14 (×4): qty 3

## 2021-04-14 MED ORDER — MELATONIN 3 MG PO TABS
6.0000 mg | ORAL_TABLET | Freq: Once | ORAL | Status: DC
  Filled 2021-04-14: qty 2

## 2021-04-14 MED ORDER — PREDNISONE 20 MG PO TABS
40.0000 mg | ORAL_TABLET | Freq: Every day | ORAL | Status: DC
  Administered 2021-04-14 – 2021-04-18 (×5): 40 mg via ORAL
  Filled 2021-04-14 (×6): qty 2

## 2021-04-14 MED ORDER — SACUBITRIL-VALSARTAN 24-26 MG PO TABS
1.0000 | ORAL_TABLET | Freq: Two times a day (BID) | ORAL | Status: DC
  Administered 2021-04-14 – 2021-04-15 (×3): 1 via ORAL
  Filled 2021-04-14 (×3): qty 1

## 2021-04-14 MED ORDER — ACETAMINOPHEN 325 MG PO TABS: 650.0000 mg | ORAL_TABLET | Freq: Four times a day (QID) | ORAL | Status: DC | PRN

## 2021-04-14 MED ORDER — FUROSEMIDE 10 MG/ML IJ SOLN
20.0000 mg | Freq: Two times a day (BID) | INTRAMUSCULAR | Status: DC
  Administered 2021-04-14 – 2021-04-16 (×5): 20 mg via INTRAVENOUS
  Filled 2021-04-14 (×5): qty 2

## 2021-04-14 MED ORDER — ZOLPIDEM TARTRATE 5 MG PO TABS
5.0000 mg | ORAL_TABLET | Freq: Every evening | ORAL | Status: DC | PRN
  Administered 2021-04-14 – 2021-04-18 (×5): 5 mg via ORAL
  Filled 2021-04-14 (×5): qty 1

## 2021-04-14 MED ORDER — FLUTICASONE-UMECLIDIN-VILANT 100-62.5-25 MCG/INH IN AEPB: 1.0000 | INHALATION_SPRAY | Freq: Every day | RESPIRATORY_TRACT | Status: DC

## 2021-04-14 MED ORDER — ACETAMINOPHEN 650 MG RE SUPP: 650.0000 mg | Freq: Four times a day (QID) | RECTAL | Status: DC | PRN

## 2021-04-14 MED ORDER — INSULIN DETEMIR 100 UNIT/ML ~~LOC~~ SOLN
10.0000 [IU] | Freq: Every day | SUBCUTANEOUS | Status: DC
  Administered 2021-04-14: 10 [IU] via SUBCUTANEOUS
  Filled 2021-04-14 (×2): qty 0.1

## 2021-04-14 NOTE — Progress Notes (Signed)
Patients Pulse rate 127 . [place] yellow no change from baseline  04/14/21 0922  Vitals  Temp 98.2 F (36.8 C)  Temp Source Oral  BP (!) 149/85  MAP (mmHg) 93  BP Location Right Arm  BP Method Automatic  Patient Position (if appropriate) Lying  Pulse Rate (!) 127  Pulse Rate Source Monitor  Resp 18  MEWS COLOR  MEWS Score Color Yellow  Oxygen Therapy  SpO2 95 %  O2 Device Nasal Cannula  O2 Flow Rate (L/min) 2 L/min  MEWS Score  MEWS Temp 0  MEWS Systolic 0  MEWS Pulse 2  MEWS RR 0  MEWS LOC 0  MEWS Score 2

## 2021-04-14 NOTE — Progress Notes (Signed)
Patient concerned about SSI . States " I don't want to bring my humalog but I will and inject myself". Educated patient not to do that as he could harm himself. Patient agreed. Notified Dr. Joesph Fillers. Patient also asking to see if Dr. Domenic Polite could see him while inpatient.

## 2021-04-14 NOTE — Progress Notes (Signed)
Patient Demographics:    Tommy Riley, is a 75 y.o. male, DOB - 1946-06-12, NID:[id number]  Admit date - 04/13/2021   Admitting Physician Stepfanie Yott Denton Brick, MD  Outpatient Primary MD for the patient is Pomposini, [name], MD  LOS - 0   Chief Complaint  Patient presents with   Shortness of Breath        Subjective:    Tommy Riley today has no fevers, no emesis,  No chest pain,   -Shortness of breath improving - Patient is unhappy about his medication schedule and doses--- patient wants to take his medications exactly the way he did at home prior to admission -However--explained rationale for adjusting medication after patient given a soft BP -Patient understands but would still like to continue his preadmission home regimen--- which at this time may not be safe given his vitals/parameters  Assessment  & Plan :    Principal Problem:   Acute exacerbation of CHF (congestive heart failure) ([place]) Active Problems:   COPD (chronic obstructive pulmonary disease) (HCC)   S/P CABG (coronary artery bypass graft)   Atrial fibrillation (HCC)   OSA on CPAP   Diabetes mellitus type 2 with complications (HCC)   Chronic respiratory failure with hypoxia (HCC)   Elevated brain natriuretic peptide (BNP) level   Elevated troponin I level   Hyperglycemia due to diabetes mellitus ([place])   Acute respiratory failure with hypoxia (HCC)   Shortness of breath   Fall at home, initial encounter   Ecchymosis  Brief Summary:- 75 y.o. male with medical history significant for type 2 diabetes mellitus, hyperlipidemia, CHF, CAD s/p CABG, COPD, OSA on CPAP, gout admitted on 04/13/2021 with acute hypoxic respiratory failure due to combination of COPD and CHF exacerbation  A/p 1) acute hypoxic respiratory failure--- due to combination of CHF and COPD exacerbation -Received steroids -BP is too soft to aggressively diurese  okay to use Lasix 20 mg IV every 12 hours -Continue bronchodilators and mucolytics and prednisone  2)DM2-continue Levemir 10 units every afternoon along with sliding scale coverage -Use Novolog/Humalog Sliding scale insulin with Accu-Cheks/Fingersticks as ordered   3)H/o CAD--status post prior CABG, no chest pains--- continue low-dose metoprolol, blood pressure too soft to allow for titration -Troponin peaked at 127 troponin pattern not consistent with ACS -Crestor and Zetia as ordered  4) chronic atrial fibrillation--- metoprolol for rate control as above #3,  -EF as below in #5, severe biatrial enlargement noted - Coumadin for anticoagulation  5)HFrEF-chronic diastolic dysfunction CHF Echo from 04/14/2021 with EF of 35 to 40% -Global hypokinesis of the left ventricle noted -c/n Entresto as BP allows -Low-dose Lasix as above #1 -Soft BP limits therapeutic options at this time  6) CKD stage - 3A-  -Renal function appears clinically better -renally adjust medications, avoid nephrotoxic agents / dehydration  / hypotension  7) skin and soft tissue bruising/ecchymosis from fall while on Coumadin--- patient said he fell couple weeks prior to admission -Please see pictures/photos in epic  8) obesity/OSA----CPAP nightly advised   Disposition/Need for in-Hospital Stay- patient unable to be discharged at this time due to --acute hypoxic respiratory failure secondary to combination of CHF and COPD requiring further interventions and gentle IV diuresis especially given soft BP*  Status is: Inpatient  Remains inpatient appropriate because: Please see disposition above  Disposition: The patient is from: Home              Anticipated d/c is to: Home              Anticipated d/c date is: 2 days              Patient currently is not medically stable to d/c. Barriers: Not Clinically Stable-   Code Status :  -  Code Status: Full Code   Family Communication:    (patient is alert, awake and  coherent)   Consults  :  na  DVT Prophylaxis  :   - SCDs   SCDs Start: 04/14/21 0059    Lab Results  Component Value Date   PLT 161 04/14/2021    Inpatient Medications  Scheduled Meds:  aspirin  81 mg Oral Daily   ezetimibe  10 mg Oral Daily   fluticasone furoate-vilanterol  1 puff Inhalation Daily   furosemide  20 mg Intravenous Q12H   insulin aspart  0-5 Units Subcutaneous QHS   insulin aspart  0-9 Units Subcutaneous TID WC   insulin detemir  10 Units Subcutaneous Q2200   melatonin  6 mg Oral Once   metoprolol tartrate  25 mg Oral BID   omega-3 acid ethyl esters  1 g Oral Daily   rosuvastatin  40 mg Oral Daily   sacubitril-valsartan  1 tablet Oral BID   umeclidinium bromide  1 puff Inhalation Daily   Continuous Infusions: PRN Meds:.acetaminophen **OR** acetaminophen, albuterol, zolpidem    Anti-infectives (From admission, onward)    None         Objective:   Vitals:   04/14/21 1346 04/14/21 1426 04/14/21 1500 04/14/21 1800  BP:  99/68  111/73  Pulse:  (!) 49 (!) 119 (!) 103  Resp:  18  20  Temp:  97.9 F (36.6 C)    TempSrc:  Oral    SpO2: 96% 97%      Wt Readings from Last 3 Encounters:  12/06/20 125.6 kg  04/04/20 126.6 kg  09/19/19 128.4 kg     Intake/Output Summary (Last 24 hours) at 04/14/2021 1920 Last data filed at 04/14/2021 1853 Gross per 24 hour  Intake 1920 ml  Output 1600 ml  Net 320 ml     Physical Exam  Gen:- Awake Alert,  in no apparent distress  HEENT:- [place].AT, No sclera icterus Nose-  2L/min Neck-Supple Neck, Lungs-and movement is fair, no significant wheezing  CV- S1, S2 normal, irregularly irregular Abd-  +ve B.Sounds, Abd Soft, No tenderness,    Extremity/Skin:- +ve  edema, pedal pulses present  Psych-affect is appropriate, oriented x3 Neuro-no new focal deficits, no tremors   Data Review:   Micro Results Recent Results (from the past 240 hour(s))  Resp Panel by RT-PCR (Flu A&B, Covid) Nasopharyngeal Swab      Status: None   Collection Time: 04/13/21  5:10 PM   Specimen: Nasopharyngeal Swab; Nasopharyngeal(NP) swabs in vial transport medium  Result Value Ref Range Status   SARS Coronavirus 2 by RT PCR NEGATIVE NEGATIVE Final    Comment: (NOTE) SARS-CoV-2 target nucleic acids are NOT DETECTED.  The SARS-CoV-2 RNA is generally detectable in upper respiratory specimens during the acute phase of infection. The lowest concentration of SARS-CoV-2 viral copies this assay can detect is 138 copies/mL. A negative result does not preclude SARS-Cov-2 infection and should not be used as the sole basis for treatment  or other patient management decisions. A negative result may occur with  improper specimen collection/handling, submission of specimen other than nasopharyngeal swab, presence of viral mutation(s) within the areas targeted by this assay, and inadequate number of viral copies(<138 copies/mL). A negative result must be combined with clinical observations, patient history, and epidemiological information. The expected result is Negative.  Fact Sheet for Patients:  EntrepreneurPulse.com.au  Fact Sheet for Healthcare Providers:  IncredibleEmployment.be  This test is no t yet approved or cleared by the [place] FDA and  has been authorized for detection and/or diagnosis of SARS-CoV-2 by FDA under an Emergency Use Authorization (EUA). This EUA will remain  in effect (meaning this test can be used) for the duration of the COVID-19 declaration under Section 564(b)(1) of the Act, 21 U.S.C.section 360bbb-3(b)(1), unless the authorization is terminated  or revoked sooner.       Influenza A by PCR NEGATIVE NEGATIVE Final   Influenza B by PCR NEGATIVE NEGATIVE Final    Comment: (NOTE) The Xpert Xpress SARS-CoV-2/FLU/RSV plus assay is intended as an aid in the diagnosis of influenza from Nasopharyngeal swab specimens and should not be used as a sole basis  for treatment. Nasal washings and aspirates are unacceptable for Xpert Xpress SARS-CoV-2/FLU/RSV testing.  Fact Sheet for Patients: EntrepreneurPulse.com.au  Fact Sheet for Healthcare Providers: IncredibleEmployment.be  This test is not yet approved or cleared by the [place] FDA and has been authorized for detection and/or diagnosis of SARS-CoV-2 by FDA under an Emergency Use Authorization (EUA). This EUA will remain in effect (meaning this test can be used) for the duration of the COVID-19 declaration under Section 564(b)(1) of the Act, 21 U.S.C. section 360bbb-3(b)(1), unless the authorization is terminated or revoked.  Performed at [facility], [address], [place], [place] [address]   Culture, blood (routine x 2)     Status: None (Preliminary result)   Collection Time: 04/13/21  5:40 PM   Specimen: BLOOD RIGHT HAND  Result Value Ref Range Status   Specimen Description BLOOD RIGHT HAND  Final   Special Requests   Final    BOTTLES DRAWN AEROBIC AND ANAEROBIC Blood Culture results may not be optimal due to an excessive volume of blood received in culture bottles   Culture   Final    NO GROWTH < 24 HOURS Performed at [facility], [address], [place], [place] [address]    Report Status PENDING  Incomplete  Culture, blood (routine x 2)     Status: None (Preliminary result)   Collection Time: 04/13/21  6:07 PM   Specimen: Left Antecubital; Blood  Result Value Ref Range Status   Specimen Description LEFT ANTECUBITAL  Final   Special Requests   Final    BOTTLES DRAWN AEROBIC AND ANAEROBIC Blood Culture adequate volume   Culture   Final    NO GROWTH < 24 HOURS Performed at [facility], [address] [place], [place] [address]    Report Status PENDING  Incomplete    Radiology Reports DG Chest Port 1 View  Result Date: 04/13/2021 CLINICAL DATA:  Shortness of breath for 1 week that is worsening, cough, and fever starting 12  hours ago, history CHF, COPD, former smoker EXAM: PORTABLE CHEST 1 VIEW COMPARISON:  Portable exam 1733 hours compared to 04/09/2021 FINDINGS: Enlargement of cardiac silhouette post median sternotomy. Mediastinal contours and pulmonary vascularity normal. Atherosclerotic calcification aorta. Slight chronic accentuation of interstitial markings, stable. No acute infiltrate, pleural effusion, or pneumothorax. Osseous structures unremarkable.  IMPRESSION: Enlargement of cardiac silhouette. No definite acute infiltrate. Electronically Signed   By: Lavonia Dana M.D.   On: 04/13/2021 18:00   ECHOCARDIOGRAM COMPLETE  Result Date: 04/14/2021    ECHOCARDIOGRAM REPORT   Patient Name:   Tommy Riley Date of Exam: 04/14/2021 Medical Rec #:  [id number]        Height:       71.5 in Accession #:    [id number]       Weight:       277.0 lb Date of Birth:  06-08-1946       BSA:          2.434 m Patient Age:    77 years         BP:           108//60 mmHg Patient Gender: M                HR:           105 bpm. Exam Location:  [facility] Procedure: 2D Echo, Cardiac Doppler and Color Doppler Indications:     CHF-Acute Diastolic  History:         Patient has no prior history of Echocardiogram examinations.                  CAD, Prior CABG, COPD, Arrythmias:Atrial Fibrillation,                  Signs/Symptoms:Shortness of Breath; Risk Factors:Hypertension                  and Diabetes.  Sonographer:     Wenda Low Referring Phys:  [id number] OLADAPO ADEFESO Diagnosing Phys: Oswaldo Milian MD IMPRESSIONS  1. Left ventricular ejection fraction, by estimation, is 35 to 40%. The left ventricle has moderately decreased function. The left ventricle demonstrates global hypokinesis. There is mild left ventricular hypertrophy. Left ventricular diastolic parameters are indeterminate. In Afib with RVR during study, consider repeat limited echo to better evaluate systolic function once rates better controlled.  2. Right ventricular  systolic function is mildly reduced. The right ventricular size is normal. There is severely elevated pulmonary artery systolic pressure. The estimated right ventricular systolic pressure is 09.3 mmHg.  3. Left atrial size was severely dilated.  4. Right atrial size was severely dilated.  5. The mitral valve is normal in structure. Trivial mitral valve regurgitation.  6. The aortic valve is tricuspid. Aortic valve regurgitation is not visualized. No aortic stenosis is present.  7. The inferior vena cava is dilated in size with <50% respiratory variability, suggesting right atrial pressure of 15 mmHg. FINDINGS  Left Ventricle: Left ventricular ejection fraction, by estimation, is 35 to 40%. The left ventricle has moderately decreased function. The left ventricle demonstrates global hypokinesis. Definity contrast agent was given IV to delineate the left ventricular endocardial borders. The left ventricular internal cavity size was normal in size. There is mild left ventricular hypertrophy. Left ventricular diastolic parameters are indeterminate. Right Ventricle: The right ventricular size is normal. Right vetricular wall thickness was not well visualized. Right ventricular systolic function is mildly reduced. There is severely elevated pulmonary artery systolic pressure. The tricuspid regurgitant velocity is 3.57 m/s, and with an assumed right atrial pressure of 15 mmHg, the estimated right ventricular systolic pressure is 26.7 mmHg. Left Atrium: Left atrial size was severely dilated. Right Atrium: Right atrial size was severely dilated. Pericardium: There is no evidence of pericardial effusion. Mitral Valve: The mitral valve  is normal in structure. Trivial mitral valve regurgitation. MV peak gradient, 8.2 mmHg. The mean mitral valve gradient is 4.0 mmHg. Tricuspid Valve: The tricuspid valve is normal in structure. Tricuspid valve regurgitation is trivial. Aortic Valve: The aortic valve is tricuspid. Aortic valve  regurgitation is not visualized. No aortic stenosis is present. Aortic valve mean gradient measures 3.7 mmHg. Aortic valve peak gradient measures 7.9 mmHg. Aortic valve area, by VTI measures 2.55 cm. Pulmonic Valve: The pulmonic valve was not well visualized. Pulmonic valve regurgitation is not visualized. Aorta: The aortic root is normal in size and structure. Venous: The inferior vena cava is dilated in size with less than 50% respiratory variability, suggesting right atrial pressure of 15 mmHg. IAS/Shunts: The interatrial septum was not well visualized.  LEFT VENTRICLE PLAX 2D LVIDd:         5.40 cm   Diastology LVIDs:         4.55 cm   LV e' medial:    9.00 cm/s LV PW:         1.30 cm   LV E/e' medial:  14.7 LV IVS:        1.30 cm   LV e' lateral:   13.00 cm/s LVOT diam:     2.00 cm   LV E/e' lateral: 10.2 LV SV:         52 LV SV Index:   21 LVOT Area:     3.14 cm  RIGHT VENTRICLE RV Basal diam:  3.50 cm RV Mid diam:    2.50 cm LEFT ATRIUM              Index        RIGHT ATRIUM           Index LA diam:        6.30 cm  2.59 cm/m   RA Area:     33.50 cm LA Vol (A2C):   162.0 ml 66.55 ml/m  RA Volume:   120.00 ml 49.30 ml/m LA Vol (A4C):   170.0 ml 69.84 ml/m LA Biplane Vol: 167.0 ml 68.60 ml/m  AORTIC VALVE                    PULMONIC VALVE AV Area (Vmax):    2.30 cm     PV Vmax:       1.03 m/s AV Area (Vmean):   2.39 cm     PV Peak grad:  4.2 mmHg AV Area (VTI):     2.55 cm AV Vmax:           140.67 cm/s AV Vmean:          85.267 cm/s AV VTI:            0.202 m AV Peak Grad:      7.9 mmHg AV Mean Grad:      3.7 mmHg LVOT Vmax:         103.00 cm/s LVOT Vmean:        64.950 cm/s LVOT VTI:          0.164 m LVOT/AV VTI ratio: 0.81  AORTA Ao Root diam: 3.30 cm MITRAL VALVE                TRICUSPID VALVE MV Area (PHT): 4.15 cm     TR Peak grad:   51.0 mmHg MV Area VTI:   1.91 cm     TR Vmax:        357.00 cm/s MV Peak  grad:  8.2 mmHg MV Mean grad:  4.0 mmHg     SHUNTS MV Vmax:       1.43 m/s     Systemic  VTI:  0.16 m MV Vmean:      87.7 cm/s    Systemic Diam: 2.00 cm MV Decel Time: 183 msec MV E velocity: 132.00 cm/s Oswaldo Milian MD Electronically signed by Oswaldo Milian MD Signature Date/Time: 04/14/2021/12:02:52 PM    Final (Updated)      CBC Recent Labs  Lab 04/13/21 1724 04/14/21 0520  WBC 6.8 7.0  HGB 12.4* 12.6*  HCT 40.2 40.7  PLT 153 161  MCV 96.4 96.0  MCH 29.7 29.7  MCHC 30.8 31.0  RDW 17.2* 17.1*  LYMPHSABS 0.6*  --   MONOABS 0.6  --   EOSABS 0.1  --   BASOSABS 0.0  --     Chemistries  Recent Labs  Lab 04/13/21 1724 04/14/21 0520  NA 138 137  K 4.8 4.8  CL 102 101  CO2 27 25  GLUCOSE 128* 234*  BUN 42* 43*  CREATININE 1.64* 1.62*  CALCIUM 8.9 9.0  MG  --  2.3  AST  --  34  ALT  --  44  ALKPHOS  --  87  BILITOT  --  1.3*   ------------------------------------------------------------------------------------------------------------------ No results for input(s): CHOL, HDL, LDLCALC, TRIG, CHOLHDL, LDLDIRECT in the last 72 hours.  Lab Results  Component Value Date   HGBA1C 7.0 (H) 04/13/2021   ------------------------------------------------------------------------------------------------------------------ No results for input(s): TSH, T4TOTAL, T3FREE, THYROIDAB in the last 72 hours.  Invalid input(s): FREET3 ------------------------------------------------------------------------------------------------------------------ No results for input(s): VITAMINB12, FOLATE, FERRITIN, TIBC, IRON, RETICCTPCT in the last 72 hours.  Coagulation profile Recent Labs  Lab 04/13/21 1724 04/14/21 0520  INR 1.8* 1.7*    No results for input(s): DDIMER in the last 72 hours.  Cardiac Enzymes No results for input(s): CKMB, TROPONINI, MYOGLOBIN in the last 168 hours.  Invalid input(s): CK ------------------------------------------------------------------------------------------------------------------    Component Value Date/Time   BNP 305.0 (H)  04/13/2021 1724     Roxan Hockey M.D on 04/14/2021 at 7:20 PM  Go to [email or web address] - for contact info  [facility] - Office  [phone]

## 2021-04-14 NOTE — Progress Notes (Signed)
Patient alert and oriented X 4. Ambulated from stretcher to bed on 2Lnc. Tolerated fair with rest period upon sitting. Patient inquired about getting his home medications at the time that he takes them at home. RN called pharmacy and had some medication times adjusted and gave them to the patient. The patient demanded to speak to the admitting MD. RN paged on-call MD. MD came to bedside and educated extensively concerning how the patient's medications are scheduled and given in the hospital setting. Extensive education given on sliding scale insulin and blood glucose monitoring in the hospital setting vs home monitoring. The [facility], charge nurse, and this RN were in the patient's room during the MD's conversation with the patient.The meeting ended with the patient content with the plan going forward.

## 2021-04-14 NOTE — Progress Notes (Signed)
Advised patient to have family bring in home CPAP.

## 2021-04-14 NOTE — Plan of Care (Signed)

## 2021-04-14 NOTE — Progress Notes (Signed)
RN called this RT to inform her that patient does not want to wear his CPAP tonight due to lack of humidification.  Explained to RN that we do not do humidification anymore due to infection risk.  Patient expressed that he wanted a neb treatment.

## 2021-04-15 ENCOUNTER — Encounter (HOSPITAL_COMMUNITY): Payer: Self-pay | Admitting: Family Medicine

## 2021-04-15 DIAGNOSIS — E118 Type 2 diabetes mellitus with unspecified complications: Secondary | ICD-10-CM

## 2021-04-15 DIAGNOSIS — I272 Pulmonary hypertension, unspecified: Secondary | ICD-10-CM

## 2021-04-15 LAB — GLUCOSE, CAPILLARY
Glucose-Capillary: 216 mg/dL — ABNORMAL HIGH (ref 70–99)
Glucose-Capillary: 281 mg/dL — ABNORMAL HIGH (ref 70–99)
Glucose-Capillary: 272 mg/dL — ABNORMAL HIGH (ref 70–99)
Glucose-Capillary: 278 mg/dL — ABNORMAL HIGH (ref 70–99)

## 2021-04-15 LAB — TSH: TSH: 0.541 u[IU]/mL (ref 0.350–4.500)

## 2021-04-15 MED ORDER — WARFARIN SODIUM 5 MG PO TABS
5.0000 mg | ORAL_TABLET | Freq: Once | ORAL | Status: AC
  Administered 2021-04-15: 5 mg via ORAL
  Filled 2021-04-15: qty 1

## 2021-04-15 MED ORDER — INSULIN ASPART 100 UNIT/ML IJ SOLN
0.0000 [IU] | Freq: Every day | INTRAMUSCULAR | Status: DC
Start: 2021-04-15 — End: 2021-04-19
  Administered 2021-04-15 – 2021-04-16 (×2): 3 [IU] via SUBCUTANEOUS
  Administered 2021-04-17: 2 [IU] via SUBCUTANEOUS
  Administered 2021-04-18: 3 [IU] via SUBCUTANEOUS

## 2021-04-15 MED ORDER — DM-GUAIFENESIN ER 30-600 MG PO TB12
1.0000 | ORAL_TABLET | Freq: Two times a day (BID) | ORAL | Status: DC
  Administered 2021-04-15 – 2021-04-19 (×9): 1 via ORAL
  Filled 2021-04-15 (×9): qty 1

## 2021-04-15 MED ORDER — LEVALBUTEROL HCL 0.63 MG/3ML IN NEBU
0.6300 mg | INHALATION_SOLUTION | Freq: Four times a day (QID) | RESPIRATORY_TRACT | Status: DC | PRN
  Administered 2021-04-16: 0.63 mg via RESPIRATORY_TRACT
  Filled 2021-04-15: qty 3

## 2021-04-15 MED ORDER — WARFARIN - PHARMACIST DOSING INPATIENT: Freq: Every day | Status: DC

## 2021-04-15 MED ORDER — POTASSIUM CHLORIDE CRYS ER 10 MEQ PO TBCR
10.0000 meq | EXTENDED_RELEASE_TABLET | Freq: Two times a day (BID) | ORAL | Status: DC
  Administered 2021-04-15 – 2021-04-19 (×8): 10 meq via ORAL
  Filled 2021-04-15 (×14): qty 1

## 2021-04-15 MED ORDER — INSULIN DETEMIR 100 UNIT/ML ~~LOC~~ SOLN
8.0000 [IU] | Freq: Two times a day (BID) | SUBCUTANEOUS | Status: DC
  Administered 2021-04-15 – 2021-04-17 (×5): 8 [IU] via SUBCUTANEOUS
  Filled 2021-04-15 (×7): qty 0.08

## 2021-04-15 MED ORDER — INSULIN ASPART 100 UNIT/ML IJ SOLN
2.0000 [IU] | Freq: Three times a day (TID) | INTRAMUSCULAR | Status: DC
  Administered 2021-04-15 – 2021-04-18 (×10): 2 [IU] via SUBCUTANEOUS

## 2021-04-15 MED ORDER — INSULIN ASPART 100 UNIT/ML IJ SOLN
0.0000 [IU] | Freq: Three times a day (TID) | INTRAMUSCULAR | Status: DC
  Administered 2021-04-15: 8 [IU] via SUBCUTANEOUS
  Administered 2021-04-16 (×2): 3 [IU] via SUBCUTANEOUS
  Administered 2021-04-16: 5 [IU] via SUBCUTANEOUS
  Administered 2021-04-17: 3 [IU] via SUBCUTANEOUS
  Administered 2021-04-17: 5 [IU] via SUBCUTANEOUS
  Administered 2021-04-17: 3 [IU] via SUBCUTANEOUS
  Administered 2021-04-18: 2 [IU] via SUBCUTANEOUS
  Administered 2021-04-18 (×2): 3 [IU] via SUBCUTANEOUS
  Administered 2021-04-19: 7 [IU] via SUBCUTANEOUS

## 2021-04-15 NOTE — Progress Notes (Signed)
[place] for warfarin Indication: atrial fibrillation   Assessment: 49 yom with hx of afib on warfarin PTA presenting with worsening dyspnea on exertion on 10/9. Noted recent fall with significant bruising and drop from baseline Hg 16, Cardiology and [facility] discussed continuation of warfarin per note (held on admission 10/9-10/10). Pharmacy consulted to resume warfarin inpatient 10/11. No obvious bleeding issues reported. INR 1.8 on admit, down to 1.7 yesterday. Hg low but stable at 12.6, plt wnl. No labs available for today. Currently on SCDs only.  PTA warfarin dose: 4mg  daily except 3mg  on TThSa (last dose 10/8 PTA)  Goal of Therapy:  INR 2-3 Monitor platelets by anticoagulation protocol: Yes   Plan:  Warfarin 5mg  PO x 1 dose tonight Daily INR Monitor CBC, s/sx bleeding   Elicia Lamp, PharmD, BCPS Clinical Pharmacist 04/15/2021 5:42 PM

## 2021-04-15 NOTE — Progress Notes (Signed)
   04/15/21 2120  Vitals  Temp 97.6 F (36.4 C)  Temp Source Oral  BP 101/72  MAP (mmHg) 80  BP Location Left Arm  BP Method Automatic  Patient Position (if appropriate) Sitting  Pulse Rate (!) 110  ECG Heart Rate (!) 153  Resp 19  Level of Consciousness  Level of Consciousness Alert  MEWS COLOR  MEWS Score Color Yellow  Oxygen Therapy  SpO2 98 %  O2 Device Nasal Cannula  O2 Flow Rate (L/min) 4 L/min  Pain Assessment  Pain Scale 0-10  Pain Score 0  MEWS Score  MEWS Temp 0  MEWS Systolic 0  MEWS Pulse 3  MEWS RR 0  MEWS LOC 0  MEWS Score 3  Provider Notification  Provider Name/Title Dr. Josephine Cables  Date Provider Notified 04/15/21  Time Provider Notified 2115  Notification Type Page  Notification Reason Other (Comment) (HR elevated)  Provider response No new orders (given metoprolol 25 mg po)  Date of Provider Response 04/15/21  Time of Provider Response 2120

## 2021-04-15 NOTE — Progress Notes (Signed)
  MEWS continued due to patients HR. Will continue to monitor . No need to escalate at this time.    04/15/21 0542  Assess: MEWS Score  Temp 98.4 F (36.9 C)  BP 126/75  Pulse Rate (!) 120  Resp 18  Level of Consciousness Alert  SpO2 99 %  O2 Device Nasal Cannula  O2 Flow Rate (L/min) 4 L/min  Assess: MEWS Score  MEWS Temp 0  MEWS Systolic 0  MEWS Pulse 2  MEWS RR 0  MEWS LOC 0  MEWS Score 2  MEWS Score Color Yellow

## 2021-04-15 NOTE — Progress Notes (Signed)
Rechecked patients VS , YELLOW MEWS continued for HR . No need to escalate at this time,...     04/15/21 0924  Assess: MEWS Score  BP 96/62  Pulse Rate (!) 120  SpO2 97 %  Assess: MEWS Score  MEWS Temp 0  MEWS Systolic 1  MEWS Pulse 2  MEWS RR 0  MEWS LOC 0  MEWS Score 3  MEWS Score Color Yellow

## 2021-04-15 NOTE — Progress Notes (Addendum)
Patient Demographics:    Tommy Riley, is a 75 y.o. male, DOB - 06-Oct-1945, GGY:[id number]  Admit date - 04/13/2021   Admitting Physician Kalab Camps Denton Brick, MD  Outpatient Primary MD for the patient is Pomposini, [name], MD  LOS - 1   Chief Complaint  Patient presents with   Shortness of Breath        Subjective:    Tommy Riley today has no fevers, no emesis,  No chest pain,   --wife at bedside, questions answered -Voiding okay -No productive cough -Dyspnea and  tachycardia improving  Assessment  & Plan :    Principal Problem:   Acute exacerbation of CHF (congestive heart failure) ([employer]) Active Problems:   COPD (chronic obstructive pulmonary disease) (HCC)   S/P CABG (coronary artery bypass graft)   Atrial fibrillation (HCC)   OSA on CPAP   Diabetes mellitus type 2 with complications (HCC)   Chronic respiratory failure with hypoxia (HCC)   Elevated brain natriuretic peptide (BNP) level   Elevated troponin I level   Hyperglycemia due to diabetes mellitus (HCC)   Acute respiratory failure with hypoxia (HCC)   Shortness of breath   Fall at home, initial encounter   Ecchymosis  Brief Summary:- 75 y.o. male with medical history significant for type 2 diabetes mellitus, hyperlipidemia, CHF, CAD s/p CABG, COPD, OSA on CPAP, gout admitted on 04/13/2021 with acute hypoxic respiratory failure due to combination of COPD and CHF exacerbation  A/p 1)Acute hypoxic respiratory failure--- due to combination of CHF and COPD exacerbation -Received steroids -BP is too soft to aggressively diurese  -c/n Lasix 20 mg IV every 12 hours -Continue bronchodilators and mucolytics and prednisone  2)DM2-A1c 7.0 reflecting fair diabetic control PTA -Hyperglycemia most likely due to steroids, change Levemir to 8 units twice daily and adjust sliding scale coverage -Use Novolog/Humalog Sliding scale insulin  with Accu-Cheks/Fingersticks as ordered   3)H/o CAD--status post prior CABG, no chest pains--- continue low-dose metoprolol, blood pressure too soft to allow for titration -Troponin peaked at 127 troponin pattern not consistent with ACS -Crestor and Zetia as ordered -Cardiology consult appreciated  4) chronic atrial fibrillation--- metoprolol for rate control as above #3,  -EF as below in #5, severe biatrial enlargement noted - Coumadin for anticoagulation  5)HFrEF/Pulm HTN-chronic systolic dysfunction CHF Echo from 04/14/2021 with EF of 35 to 40% -Global hypokinesis of the left ventricle noted -Consider restarting Entresto as BP allows -Low-dose Lasix as above #1 -Soft BP limits therapeutic options at this time  6) CKD stage - 3A-  -Renal function appears clinically better -renally adjust medications, avoid nephrotoxic agents / dehydration  / hypotension  7) skin and soft tissue bruising/ecchymosis from fall while on Coumadin--- patient said he fell couple weeks prior to admission -X-[facility] of the hip and chest done [facility] on 04/09/2021 without fractures -Please see pictures/photos in epic  8) obesity/OSA----CPAP nightly advised  9) acute anemia--hgb currently around 12 baseline usually around 16 =-Suspect partly due to recent ecchymosis/significant bruising please see photos in epic -No obvious bleeding at this time  Disposition/Need for in-Hospital Stay- patient unable to be discharged at this time due to --acute hypoxic respiratory failure secondary to combination of CHF and COPD requiring further interventions and gentle  IV diuresis especially given soft BP*  Status is: Inpatient  Remains inpatient appropriate because: Please see disposition above  Disposition: The patient is from: Home              Anticipated d/c is to: Home              Anticipated d/c date is: 2 days              Patient currently is not medically stable to d/c. Barriers: Not Clinically  Stable-   Code Status :  -  Code Status: Full Code   Family Communication:    (patient is alert, awake and coherent)  Discussed with wife at bedside Consults  : Cardiology  DVT Prophylaxis  :   - SCDs   SCDs Start: 04/14/21 0059  Lab Results  Component Value Date   PLT 161 04/14/2021    Inpatient Medications  Scheduled Meds:  aspirin  81 mg Oral Daily   dextromethorphan-guaiFENesin  1 tablet Oral BID   ezetimibe  10 mg Oral Daily   fluticasone furoate-vilanterol  1 puff Inhalation Daily   furosemide  20 mg Intravenous Q12H   insulin aspart  0-15 Units Subcutaneous TID WC   insulin aspart  0-5 Units Subcutaneous QHS   insulin aspart  2 Units Subcutaneous TID WC   insulin detemir  8 Units Subcutaneous BID   metoprolol tartrate  25 mg Oral BID   omega-3 acid ethyl esters  1 g Oral Daily   predniSONE  40 mg Oral Q breakfast   rosuvastatin  40 mg Oral Daily   umeclidinium bromide  1 puff Inhalation Daily   Continuous Infusions: PRN Meds:.acetaminophen **OR** acetaminophen, albuterol, zolpidem    Anti-infectives (From admission, onward)    None         Objective:   Vitals:   04/15/21 0849 04/15/21 0924 04/15/21 1159 04/15/21 1328  BP:  96/62  123/84  Pulse:  (!) 120  (!) 111  Resp:    18  Temp:    97.8 F (36.6 C)  TempSrc:    Oral  SpO2: 96% 97%  96%  Weight:   130.2 kg     Wt Readings from Last 3 Encounters:  04/15/21 130.2 kg  12/06/20 125.6 kg  04/04/20 126.6 kg     Intake/Output Summary (Last 24 hours) at 04/15/2021 1738 Last data filed at 04/15/2021 1547 Gross per 24 hour  Intake 1680 ml  Output 2000 ml  Net -320 ml     Physical Exam  Gen:- Awake Alert,  in no apparent distress  HEENT:- [place].AT, No sclera icterus Nose- [place] 2L/min Neck-Supple Neck, Lungs-and movement is fair, no significant wheezing  CV- S1, S2 normal, irregularly irregular Abd-  +ve B.Sounds, Abd Soft, No tenderness,    Extremity/Skin:- +ve  edema, pedal pulses present   Psych-affect is appropriate, oriented x3 Neuro-no new focal deficits, no tremors   Data Review:   Micro Results Recent Results (from the past 240 hour(s))  Resp Panel by RT-PCR (Flu A&B, Covid) Nasopharyngeal Swab     Status: None   Collection Time: 04/13/21  5:10 PM   Specimen: Nasopharyngeal Swab; Nasopharyngeal(NP) swabs in vial transport medium  Result Value Ref Range Status   SARS Coronavirus 2 by RT PCR NEGATIVE NEGATIVE Final    Comment: (NOTE) SARS-CoV-2 target nucleic acids are NOT DETECTED.  The SARS-CoV-2 RNA is generally detectable in upper respiratory specimens during the acute phase of infection. The lowest concentration of  SARS-CoV-2 viral copies this assay can detect is 138 copies/mL. A negative result does not preclude SARS-Cov-2 infection and should not be used as the sole basis for treatment or other patient management decisions. A negative result may occur with  improper specimen collection/handling, submission of specimen other than nasopharyngeal swab, presence of viral mutation(s) within the areas targeted by this assay, and inadequate number of viral copies(<138 copies/mL). A negative result must be combined with clinical observations, patient history, and epidemiological information. The expected result is Negative.  Fact Sheet for Patients:  EntrepreneurPulse.com.au  Fact Sheet for Healthcare Providers:  IncredibleEmployment.be  This test is no t yet approved or cleared by the [place] FDA and  has been authorized for detection and/or diagnosis of SARS-CoV-2 by FDA under an Emergency Use Authorization (EUA). This EUA will remain  in effect (meaning this test can be used) for the duration of the COVID-19 declaration under Section 564(b)(1) of the Act, 21 U.S.C.section 360bbb-3(b)(1), unless the authorization is terminated  or revoked sooner.       Influenza A by PCR NEGATIVE NEGATIVE Final   Influenza B by  PCR NEGATIVE NEGATIVE Final    Comment: (NOTE) The Xpert Xpress SARS-CoV-2/FLU/RSV plus assay is intended as an aid in the diagnosis of influenza from Nasopharyngeal swab specimens and should not be used as a sole basis for treatment. Nasal washings and aspirates are unacceptable for Xpert Xpress SARS-CoV-2/FLU/RSV testing.  Fact Sheet for Patients: EntrepreneurPulse.com.au  Fact Sheet for Healthcare Providers: IncredibleEmployment.be  This test is not yet approved or cleared by the [place] FDA and has been authorized for detection and/or diagnosis of SARS-CoV-2 by FDA under an Emergency Use Authorization (EUA). This EUA will remain in effect (meaning this test can be used) for the duration of the COVID-19 declaration under Section 564(b)(1) of the Act, 21 U.S.C. section 360bbb-3(b)(1), unless the authorization is terminated or revoked.  Performed at [facility], [address], [place], [place] [address]   Culture, blood (routine x 2)     Status: None (Preliminary result)   Collection Time: 04/13/21  5:40 PM   Specimen: BLOOD RIGHT HAND  Result Value Ref Range Status   Specimen Description BLOOD RIGHT HAND  Final   Special Requests   Final    BOTTLES DRAWN AEROBIC AND ANAEROBIC Blood Culture results may not be optimal due to an excessive volume of blood received in culture bottles   Culture   Final    NO GROWTH 2 DAYS Performed at [facility], [address] [place], [place] [address]    Report Status PENDING  Incomplete  Culture, blood (routine x 2)     Status: None (Preliminary result)   Collection Time: 04/13/21  6:07 PM   Specimen: Left Antecubital; Blood  Result Value Ref Range Status   Specimen Description LEFT ANTECUBITAL  Final   Special Requests   Final    BOTTLES DRAWN AEROBIC AND ANAEROBIC Blood Culture adequate volume   Culture   Final    NO GROWTH 2 DAYS Performed at [facility], [address], [place], [place]  [address]    Report Status PENDING  Incomplete    Radiology Reports DG Chest Port 1 View  Result Date: 04/13/2021 CLINICAL DATA:  Shortness of breath for 1 week that is worsening, cough, and fever starting 12 hours ago, history CHF, COPD, former smoker EXAM: PORTABLE CHEST 1 VIEW COMPARISON:  Portable exam 1733 hours compared to 04/09/2021 FINDINGS: Enlargement of cardiac silhouette post median  sternotomy. Mediastinal contours and pulmonary vascularity normal. Atherosclerotic calcification aorta. Slight chronic accentuation of interstitial markings, stable. No acute infiltrate, pleural effusion, or pneumothorax. Osseous structures unremarkable. IMPRESSION: Enlargement of cardiac silhouette. No definite acute infiltrate. Electronically Signed   By: Lavonia Dana M.D.   On: 04/13/2021 18:00   ECHOCARDIOGRAM COMPLETE  Result Date: 04/14/2021    ECHOCARDIOGRAM REPORT   Patient Name:   ABDULRAHMAN BRACEY Date of Exam: 04/14/2021 Medical Rec #:  [id number]        Height:       71.5 in Accession #:    [id number]       Weight:       277.0 lb Date of Birth:  1945/09/27       BSA:          2.434 m Patient Age:    40 years         BP:           108//60 mmHg Patient Gender: M                HR:           105 bpm. Exam Location:  [facility] Procedure: 2D Echo, Cardiac Doppler and Color Doppler Indications:     CHF-Acute Diastolic  History:         Patient has no prior history of Echocardiogram examinations.                  CAD, Prior CABG, COPD, Arrythmias:Atrial Fibrillation,                  Signs/Symptoms:Shortness of Breath; Risk Factors:Hypertension                  and Diabetes.  Sonographer:     Wenda Low Referring Phys:  [id number] OLADAPO ADEFESO Diagnosing Phys: Oswaldo Milian MD IMPRESSIONS  1. Left ventricular ejection fraction, by estimation, is 35 to 40%. The left ventricle has moderately decreased function. The left ventricle demonstrates global hypokinesis. There is mild left ventricular  hypertrophy. Left ventricular diastolic parameters are indeterminate. In Afib with RVR during study, consider repeat limited echo to better evaluate systolic function once rates better controlled.  2. Right ventricular systolic function is mildly reduced. The right ventricular size is normal. There is severely elevated pulmonary artery systolic pressure. The estimated right ventricular systolic pressure is 25.9 mmHg.  3. Left atrial size was severely dilated.  4. Right atrial size was severely dilated.  5. The mitral valve is normal in structure. Trivial mitral valve regurgitation.  6. The aortic valve is tricuspid. Aortic valve regurgitation is not visualized. No aortic stenosis is present.  7. The inferior vena cava is dilated in size with <50% respiratory variability, suggesting right atrial pressure of 15 mmHg. FINDINGS  Left Ventricle: Left ventricular ejection fraction, by estimation, is 35 to 40%. The left ventricle has moderately decreased function. The left ventricle demonstrates global hypokinesis. Definity contrast agent was given IV to delineate the left ventricular endocardial borders. The left ventricular internal cavity size was normal in size. There is mild left ventricular hypertrophy. Left ventricular diastolic parameters are indeterminate. Right Ventricle: The right ventricular size is normal. Right vetricular wall thickness was not well visualized. Right ventricular systolic function is mildly reduced. There is severely elevated pulmonary artery systolic pressure. The tricuspid regurgitant velocity is 3.57 m/s, and with an assumed right atrial pressure of 15 mmHg, the estimated right ventricular systolic pressure is 56.3 mmHg. Left Atrium:  Left atrial size was severely dilated. Right Atrium: Right atrial size was severely dilated. Pericardium: There is no evidence of pericardial effusion. Mitral Valve: The mitral valve is normal in structure. Trivial mitral valve regurgitation. MV peak gradient,  8.2 mmHg. The mean mitral valve gradient is 4.0 mmHg. Tricuspid Valve: The tricuspid valve is normal in structure. Tricuspid valve regurgitation is trivial. Aortic Valve: The aortic valve is tricuspid. Aortic valve regurgitation is not visualized. No aortic stenosis is present. Aortic valve mean gradient measures 3.7 mmHg. Aortic valve peak gradient measures 7.9 mmHg. Aortic valve area, by VTI measures 2.55 cm. Pulmonic Valve: The pulmonic valve was not well visualized. Pulmonic valve regurgitation is not visualized. Aorta: The aortic root is normal in size and structure. Venous: The inferior vena cava is dilated in size with less than 50% respiratory variability, suggesting right atrial pressure of 15 mmHg. IAS/Shunts: The interatrial septum was not well visualized.  LEFT VENTRICLE PLAX 2D LVIDd:         5.40 cm   Diastology LVIDs:         4.55 cm   LV e' medial:    9.00 cm/s LV PW:         1.30 cm   LV E/e' medial:  14.7 LV IVS:        1.30 cm   LV e' lateral:   13.00 cm/s LVOT diam:     2.00 cm   LV E/e' lateral: 10.2 LV SV:         52 LV SV Index:   21 LVOT Area:     3.14 cm  RIGHT VENTRICLE RV Basal diam:  3.50 cm RV Mid diam:    2.50 cm LEFT ATRIUM              Index        RIGHT ATRIUM           Index LA diam:        6.30 cm  2.59 cm/m   RA Area:     33.50 cm LA Vol (A2C):   162.0 ml 66.55 ml/m  RA Volume:   120.00 ml 49.30 ml/m LA Vol (A4C):   170.0 ml 69.84 ml/m LA Biplane Vol: 167.0 ml 68.60 ml/m  AORTIC VALVE                    PULMONIC VALVE AV Area (Vmax):    2.30 cm     PV Vmax:       1.03 m/s AV Area (Vmean):   2.39 cm     PV Peak grad:  4.2 mmHg AV Area (VTI):     2.55 cm AV Vmax:           140.67 cm/s AV Vmean:          85.267 cm/s AV VTI:            0.202 m AV Peak Grad:      7.9 mmHg AV Mean Grad:      3.7 mmHg LVOT Vmax:         103.00 cm/s LVOT Vmean:        64.950 cm/s LVOT VTI:          0.164 m LVOT/AV VTI ratio: 0.81  AORTA Ao Root diam: 3.30 cm MITRAL VALVE                 TRICUSPID VALVE MV Area (PHT): 4.15 cm     TR Peak grad:  51.0 mmHg MV Area VTI:   1.91 cm     TR Vmax:        357.00 cm/s MV Peak grad:  8.2 mmHg MV Mean grad:  4.0 mmHg     SHUNTS MV Vmax:       1.43 m/s     Systemic VTI:  0.16 m MV Vmean:      87.7 cm/s    Systemic Diam: 2.00 cm MV Decel Time: 183 msec MV E velocity: 132.00 cm/s Oswaldo Milian MD Electronically signed by Oswaldo Milian MD Signature Date/Time: 04/14/2021/12:02:52 PM    Final (Updated)      CBC Recent Labs  Lab 04/13/21 1724 04/14/21 0520  WBC 6.8 7.0  HGB 12.4* 12.6*  HCT 40.2 40.7  PLT 153 161  MCV 96.4 96.0  MCH 29.7 29.7  MCHC 30.8 31.0  RDW 17.2* 17.1*  LYMPHSABS 0.6*  --   MONOABS 0.6  --   EOSABS 0.1  --   BASOSABS 0.0  --     Chemistries  Recent Labs  Lab 04/13/21 1724 04/14/21 0520  NA 138 137  K 4.8 4.8  CL 102 101  CO2 27 25  GLUCOSE 128* 234*  BUN 42* 43*  CREATININE 1.64* 1.62*  CALCIUM 8.9 9.0  MG  --  2.3  AST  --  34  ALT  --  44  ALKPHOS  --  87  BILITOT  --  1.3*   ------------------------------------------------------------------------------------------------------------------ No results for input(s): CHOL, HDL, LDLCALC, TRIG, CHOLHDL, LDLDIRECT in the last 72 hours.  Lab Results  Component Value Date   HGBA1C 7.0 (H) 04/13/2021   ------------------------------------------------------------------------------------------------------------------ Recent Labs    04/14/21 0520  TSH 0.541   ------------------------------------------------------------------------------------------------------------------ No results for input(s): VITAMINB12, FOLATE, FERRITIN, TIBC, IRON, RETICCTPCT in the last 72 hours.  Coagulation profile Recent Labs  Lab 04/13/21 1724 04/14/21 0520  INR 1.8* 1.7*    No results for input(s): DDIMER in the last 72 hours.  Cardiac Enzymes No results for input(s): CKMB, TROPONINI, MYOGLOBIN in the last 168 hours.  Invalid input(s):  CK ------------------------------------------------------------------------------------------------------------------    Component Value Date/Time   BNP 305.0 (H) 04/13/2021 1724     Roxan Hockey M.D on 04/15/2021 at 5:38 PM  Go to [email or web address] - for contact info  [facility] - Office  [phone]

## 2021-04-15 NOTE — Progress Notes (Signed)
   04/15/21 0924  Assess: MEWS Score  BP 96/62  Pulse Rate (!) 120  SpO2 97 %  Assess: MEWS Score  MEWS Temp 0  MEWS Systolic 1  MEWS Pulse 2  MEWS RR 0  MEWS LOC 0  MEWS Score 3  MEWS Score Color Yellow

## 2021-04-15 NOTE — Progress Notes (Signed)
Put in order for Xopenex for patient.  He has a history of chronic Afib.  HR can get into the 140s which is contrainidicated for Albuterol.  Patient did have family member bring in home CPAP.  Made sure it was put into red outlet and that O2 was hooked up to patient's machine.  Patient self manages the rest on his machine.

## 2021-04-15 NOTE — Consult Note (Addendum)
Cardiology Consultation:   Patient ID: RIHAN SCHUELER MRN: [id number]; DOB: 1946-01-29  Admit date: 04/13/2021 Date of Consult: 04/15/2021  PCP:  Clinton Quant, MD   [facility] Providers Cardiologist:  Rozann Lesches, MD        Patient Profile:   NENG ALBEE is a 75 y.o. male with a hx of CAD s/p CABG 2001 ([place]),  permanent atrial fibrillation (prior ablation @ [facility] 2011 with recurrence), DM, HLD, chronic systolic CHF/ischemic cardiomyopathy, COPD, depression, LBBB, CKD 3 (borderline a-b), HTN, HLD, OSA on CPAP (follows with Dr. Lamonte Sakai), stroke, pulmonary HTN, carotid artery disease, anxiety who is being seen 04/15/2021 for the evaluation of CHF at the request of Dr. Denton Brick.  History of Present Illness:   Mr. Kings recently established care for the first time with Dr. Domenic Polite 12/2020. Per his notes, he has hx of multivessel CAD s/p CABG 2001 in [place]. He had a prior afib ablation at [facility] back in 2011 with subsequent persistent atrial fibrillation and treatment with rate control strategy and anticoagulation. He has been on Coumadin which he checks at home and relays to our Coumadin clinic. At that [place], he reported being diagnosed with a cardiomyopathy earlier this year with LVEF approximately 30% which had reportedly improved to 50% with medical therapy adjustments. Dr. Domenic Polite had recommended to obtain records and f/u in 6 months.  Subsequent available records from [facility] reviewed under [facility]: - [place] (?this is the date) - 30-35% glob HK, inf wall AK/LM:OK/LAD: TO prox, DX1 90%/LCx: OM1 TO, LCx 50% distal/RCA: TO ostium, LIMA LAD: distal apical LAD has 80-90%, suspect culprit vessel/SVG PDA OK, med tx - 10213 Lexiscan positive middistal Cx, RV dilated, EF 40% - Adenosine MPI 08/2018 EF 37%, poor quality study, apical infarct as well as inferior septal wall infarct, no obvious ischemia, however, LV does dilate with stress.  - 01/10/20 TTE suboptimal in  spite of definity, EF 40%, mild-moderate MR, mild-moderate TR - Hx of pulmonary HTN, seen by [facility] by Dr. Gilles Chiquito, apparently mild pulmonary HTN, 07/14/17 RHC at [facility] - per Dr. Gilles Chiquito normal CO and PVR, no pulmonary HTN at that time - Carotid disease: listed as bilateral plaque without hemodynamically significant stenosis, no date on study available - I do not see any echo results in our available [facility] records that correlate the reported improvement in EF mentioned above - last OV note 08/2020 stated EF was 35%  Mr. Krygier has been feeling more short of breath with exertion for about 2 weeks. (This reminded him of the period of time he had in 2018 where he required O2 for a few weeks and reports his EF had gone from 52% to 37%. I cannot find any notes in the available scanned records where he'd had any normalization of his EF in recent years. He states that at the time, after medicine adjustment, he was able to wean off O2 soon after. He follows with Dr. Lamonte Sakai with last OV 10/2020 reporting severe COPD but not requiring O2 at that time.) He had a fall on 04/05/21 where he got dizzy and fell in the shower. He struck the commode and developed severe bruising to his abdomen and lower back. He saw [facility]. Plain film was negative for hip fracture and Hgb was 12.1. Prior Hgb in 12/2020 was 16.8.  In addition to his dyspnea, he has been experiencing a chronic congestion-type cough.  The day prior to [facility], he also noted a low grade fever of 100.8. Workup  here shows show Cr 1.64 (similar to 2016), Hgb 12.4, INR 1.8 on arrival, Covid/flu panel negative, troponin 127-118, lactic acid wnl, BNP 305. 2D echo yesterday was somewhat limited by AF RVR but showed EF 35-40%, global HK, mildly reduced RV systolic function, severely elevated PASP, severe biatrial enlargement and dilated IVC. He is noted to be somewhat hypotensive and tachycardic and has been placed back on nasal cannula O2. His home SGLT2i and spironolactone  were held.   He was started on low dose IV Lasix 7m IV BID and states he is feeling much better at rest, but still gets SOB with any exertion. He denies any recent significant change to chronic mild LEE. He denies any orthopnea, chest pain, or weight changes at home. He has no weights on file this admission.   Past Medical History:  Diagnosis Date   Anxiety    Atrial fibrillation and flutter ([facility]    [facility]  CAD in native artery    Chronic systolic heart failure (HCC)    CKD (chronic kidney disease), stage III (HCC)    COPD (chronic obstructive pulmonary disease) ([place]    COVID-22 February 2020   Depression    Essential hypertension    Gout    Hyperlipidemia    Ischemic heart disease    OSA (obstructive sleep apnea)    [place]roke ([place] 2003   Type 2 diabetes mellitus ([place]     Past Surgical History:  Procedure Laterality Date   ATRIAL FIBRILLATION ABLATION  2011   [facility]   CATARACT EXTRACTION Bilateral    CORONARY ARTERY BYPASS GRAFT  2001   HERNIA REPAIR Right    TONSILLECTOMY AND ADENOIDECTOMY       Home Medications:  Prior to Admission medications   Medication Sig Start Date End Date Taking? Authorizing Provider  albuterol (PROVENTIL) (2.5 MG/3ML) 0.083% nebulizer solution Take 3 mLs (2.5 mg total) by nebulization every 6 (six) hours as needed for wheezing or shortness of breath. 07/29/18  Yes Byrum, RRose Fillers MD  albuterol (VENTOLIN HFA) 108 (90 Base) MCG/ACT inhaler USE 2 INHALATIONS BY MOUTH  EVERY 6 HOURS AS NEEDED FOR WHEEZING OR SHORTNESS OF  BREATH 10/07/20  Yes Byrum, RRose Fillers MD  allopurinol (ZYLOPRIM) 100 MG tablet Take 2 tablets in the morning and 1 tablet in the evening   Yes [provider]  aspirin 81 MG tablet Take 81 mg by mouth daily.   Yes [provider]  b complex vitamins tablet Take 1 tablet by mouth daily.   Yes [provider]  Cholecalciferol (VITAMIN D3) 2000 UNITS TABS Take by mouth  daily.   Yes [provider]  diphenhydramine-acetaminophen (TYLENOL PM) 25-500 MG TABS tablet Take 2 tablets every day by oral route.   Yes [provider]  ezetimibe (ZETIA) 10 MG tablet Take 10 mg by mouth daily.   Yes [provider]  FARXIGA 10 MG TABS tablet Take 1 tablet (10 mg total) by mouth daily. 12/27/20  Yes MSatira Sark MD  Fluticasone-Umeclidin-Vilant (TRELEGY ELLIPTA) 100-62.5-25 MCG/INH AEPB Inhale 1 puff into the lungs daily. Patient needs 90 day script with 3 refills. 01/28/21  Yes BCollene Gobble MD  folic acid (FOLVITE) 4161MCG tablet Take 400 mcg by mouth daily.   Yes [provider]  guaiFENesin (MUCINEX) 600 MG 12 hr tablet Take 1,200 mg by mouth 2 (two) times daily.   Yes [provider]  HCleda Clarks  200 UNIT/ML KwikPen Inject 20 Units into the skin every evening. 11/11/20  Yes [provider]  insulin detemir (LEVEMIR) 100 UNIT/ML injection Inject 30 Units into the skin daily.   Yes [provider]  loratadine (CLARITIN) 10 MG tablet Take 10 mg by mouth daily.   Yes [provider]  MELATONIN PO Take 6 mg by mouth daily.   Yes [provider]  metoprolol succinate (TOPROL-XL) 50 MG 24 hr tablet Take 50 mg by mouth 2 (two) times daily. Take 1 tablet 2 times daily   Yes [provider]  Multiple Vitamins-Minerals (CENTRUM SILVER PO) Take 1 tablet by mouth daily.    Yes [provider]  Nutritional Supplements (COLD AND FLU PO) Take 1 tablet by mouth daily as needed (cough aches).   Yes [provider]  omega-3 acid ethyl esters (LOVAZA) 1 G capsule Take by mouth in the morning, at noon, and at bedtime.   Yes [provider]  oxymetazoline (AFRIN) 0.05 % nasal spray Place 1 spray into both nostrils 2 (two) times daily.   Yes [provider]  Potassium Chloride (KLOR-CON 10 PO) Take 20 mg by mouth in the morning, at noon, and at bedtime. Extended  release   Yes [provider]  rosuvastatin (CRESTOR) 40 MG tablet Take 40 mg by mouth daily.   Yes [provider]  sacubitril-valsartan (ENTRESTO) 24-26 MG Take 1 tablet by mouth 2 (two) times daily. 03/31/21  Yes Satira Sark, MD  SERTRALINE HCL PO Take 150 mg by mouth daily.   Yes [provider]  spironolactone (ALDACTONE) 100 MG tablet Take 100 mg by mouth daily.   Yes [provider]  torsemide (DEMADEX) 20 MG tablet Take 40 mg by mouth 2 (two) times daily.   Yes [provider]  vitamin C (ASCORBIC ACID) 500 MG tablet Take 500 mg by mouth daily.   Yes [provider]  warfarin (COUMADIN) 3 MG tablet Take 3 mg by mouth daily. And take 39m on Sun, Monday, Wednesday and Friday 3 mg all other days   Yes [provider]  Zinc Methionate 50 MG CAPS Take 1 capsule by mouth daily.   Yes [provider]  ACCU-CHEK AVIVA PLUS test strip  07/09/14   [provider]  Blood Glucose Monitoring Suppl (ONE TOUCH ULTRA 2) W/DEVICE KIT by Does not apply route.    [provider]  insulin aspart (NOVOLOG) 100 UNIT/ML injection Inject into the skin daily with breakfast. Up to 20 units Patient not taking: No sig reported    [provider]  insulin lispro (HUMALOG) 100 UNIT/ML injection Inject into the skin. Patient not taking: No sig reported    [provider]    Inpatient Medications: Scheduled Meds:  aspirin  81 mg Oral Daily   dextromethorphan-guaiFENesin  1 tablet Oral BID   ezetimibe  10 mg Oral Daily   fluticasone furoate-vilanterol  1 puff Inhalation Daily   furosemide  20 mg Intravenous Q12H   insulin aspart  0-5 Units Subcutaneous QHS   insulin aspart  0-9 Units Subcutaneous TID WC   insulin detemir  8 Units Subcutaneous BID   metoprolol tartrate  25 mg Oral BID   omega-3 acid ethyl esters  1 g Oral Daily   predniSONE  40 mg Oral Q breakfast   rosuvastatin  40 mg Oral Daily    umeclidinium bromide  1 puff Inhalation Daily   Continuous Infusions:  PRN Meds:  acetaminophen **OR** acetaminophen, albuterol, zolpidem  Allergies:    Allergies  Allergen Reactions   Codeine Anaphylaxis    Tightness in chest    Social History:   Social History   Socioeconomic History   Marital status: Married    Spouse name: [name]   Number of children: 3   Years of education: 14   Highest education level: Not on file  Occupational History    Comment: retired  Tobacco Use   Smoking status: Former    Packs/day: 2.50    Years: 50.00    Pack years: 125.00    Types: Cigarettes    Quit date: 06/17/2011    Years since quitting: 9.8   Smokeless tobacco: Never  Vaping Use   Vaping Use: Some days  Substance and Sexual Activity   Alcohol use: No    Alcohol/week: 0.0 standard drinks   Drug use: No   Sexual activity: Not on file  Other Topics Concern   Not on file  Social History Narrative   Consumes no caffeine   Social Determinants of Health   Financial Resource Strain: Not on file  Food Insecurity: Not on file  Transportation Needs: Not on file  Physical Activity: Not on file  Stress: Not on file  Social Connections: Not on file  Intimate Partner Violence: Not on file    Family History:    Family History  Problem Relation Age of Onset   Raynaud syndrome Mother    Stroke Father    Diabetes Mellitus II Father    Hypertension Father    Hypertension Sister    Diabetes Mellitus II Sister    Stroke Brother      ROS:  Please see the history of present illness.  All other ROS reviewed and negative.     Physical Exam/Data:   Vitals:   04/14/21 2115 04/15/21 0542 04/15/21 0849 04/15/21 0924  BP:  126/75  96/62  Pulse:  (!) 120  (!) 120  Resp:  18    Temp:  98.4 F (36.9 C)    TempSrc:  Oral    SpO2: 98% 99% 96% 97%    Intake/Output Summary (Last 24 hours) at 04/15/2021 1150 Last data filed at 04/15/2021 0020 Gross per 24 hour  Intake 1440 ml   Output 900 ml  Net 540 ml   Last 3 Weights 12/06/2020 04/04/2020 09/19/2019  Weight (lbs) 277 lb 279 lb 3.2 oz 283 lb  Weight (kg) 125.646 kg 126.644 kg 128.368 kg     There is no height or weight on file to calculate BMI.  General: Well developed, well nourished morbidly obese WM, in no acute distress. Head: Normocephalic, atraumatic, sclera non-icteric, no xanthomas, nares are without discharge. Neck: Negative for carotid bruits. JVP not elevated. Lungs: Decreased BS throughout otherwise no wheezing, rales or rhonchi. Breathing is unlabored. Heart: Irregularly irregular, rate elevated, without murmurs, rubs, or gallops.  Abdomen: Soft, non-tender, non-distended with normoactive bowel sounds. No rebound/guarding. Extremities: No clubbing or cyanosis. Mild BLE edema. Distal pedal pulses are 2+ and equal bilaterally. Scattered ecchymosis Skin: profound bruising over his lower back, left hip and left abdomen Neuro: Alert and oriented X 3. Moves all extremities spontaneously. Psych:  Responds to questions appropriately with a normal affect.  EKG:  The EKG was personally reviewed and demonstrates:  atrial fib 125bpm LBBB Telemetry:  Telemetry was personally reviewed and demonstrates:  atrial fib RVR, had 28 beat run VT yesterday  Relevant CV Studies: 2D Echo 04/14/21  1. Left ventricular ejection fraction, by estimation, is 35 to 40%. The  left ventricle has moderately decreased function. The left ventricle  demonstrates global hypokinesis. There is mild left ventricular  hypertrophy. Left ventricular diastolic  parameters are indeterminate. In Afib with RVR during study, consider  repeat limited echo to better evaluate systolic function once rates better  controlled.   2. Right ventricular systolic function is mildly reduced. The right  ventricular size is normal. There is severely elevated pulmonary artery  systolic pressure. The estimated right ventricular systolic pressure is   46.5 mmHg.   3. Left atrial size was severely dilated.   4. Right atrial size was severely dilated.   5. The mitral valve is normal in structure. Trivial mitral valve  regurgitation.   6. The aortic valve is tricuspid. Aortic valve regurgitation is not  visualized. No aortic stenosis is present.   7. The inferior vena cava is dilated in size with <50% respiratory  variability, suggesting right atrial pressure of 15 mmHg.   Laboratory Data:  High Sensitivity Troponin:   Recent Labs  Lab 04/13/21 1724 04/13/21 1933  TROPONINIHS 127* 118*     Chemistry Recent Labs  Lab 04/13/21 1724 04/14/21 0520  NA 138 137  K 4.8 4.8  CL 102 101  CO2 27 25  GLUCOSE 128* 234*  BUN 42* 43*  CREATININE 1.64* 1.62*  CALCIUM 8.9 9.0  MG  --  2.3  GFRNONAA 44* 45*  ANIONGAP 9 11    Recent Labs  Lab 04/14/21 0520  PROT 6.9  ALBUMIN 3.6  AST 34  ALT 44  ALKPHOS 87  BILITOT 1.3*   Lipids No results for input(s): CHOL, TRIG, HDL, LABVLDL, LDLCALC, CHOLHDL in the last 168 hours.  Hematology Recent Labs  Lab 04/13/21 1724 04/14/21 0520  WBC 6.8 7.0  RBC 4.17* 4.24  HGB 12.4* 12.6*  HCT 40.2 40.7  MCV 96.4 96.0  MCH 29.7 29.7  MCHC 30.8 31.0  RDW 17.2* 17.1*  PLT 153 161   BNP Recent Labs  Lab 04/13/21 1724  BNP 305.0*     Radiology/Studies:  DG Chest Port 1 View  Result Date: 04/13/2021 CLINICAL DATA:  Shortness of breath for 1 week that is worsening, cough, and fever starting 12 hours ago, history CHF, COPD, former smoker EXAM: PORTABLE CHEST 1 VIEW COMPARISON:  Portable exam 1733 hours compared to 04/09/2021 FINDINGS: Enlargement of cardiac silhouette post median sternotomy. Mediastinal contours and pulmonary vascularity normal. Atherosclerotic calcification aorta. Slight chronic accentuation of interstitial markings, stable. No acute infiltrate, pleural effusion, or pneumothorax. Osseous structures unremarkable. IMPRESSION: Enlargement of cardiac silhouette. No definite  acute infiltrate. Electronically Signed   By: Lavonia Dana M.D.   On: 04/13/2021 18:00   ECHOCARDIOGRAM COMPLETE  Result Date: 04/14/2021    ECHOCARDIOGRAM REPORT   Patient Name:   NERY FRAPPIER Date of Exam: 04/14/2021 Medical Rec #:  [id number]        Height:       71.5 in Accession #:    [id number]       Weight:       277.0 lb Date of Birth:  1945-08-20       BSA:          2.434 m Patient Age:    46 years         BP:           108//60 mmHg Patient Gender: M  HR:           105 bpm. Exam Location:  [facility] Procedure: 2D Echo, Cardiac Doppler and Color Doppler Indications:     CHF-Acute Diastolic  History:         Patient has no prior history of Echocardiogram examinations.                  CAD, Prior CABG, COPD, Arrythmias:Atrial Fibrillation,                  Signs/Symptoms:Shortness of Breath; Risk Factors:Hypertension                  and Diabetes.  Sonographer:     Wenda Low Referring Phys:  [id number] OLADAPO ADEFESO Diagnosing Phys: Oswaldo Milian MD IMPRESSIONS  1. Left ventricular ejection fraction, by estimation, is 35 to 40%. The left ventricle has moderately decreased function. The left ventricle demonstrates global hypokinesis. There is mild left ventricular hypertrophy. Left ventricular diastolic parameters are indeterminate. In Afib with RVR during study, consider repeat limited echo to better evaluate systolic function once rates better controlled.  2. Right ventricular systolic function is mildly reduced. The right ventricular size is normal. There is severely elevated pulmonary artery systolic pressure. The estimated right ventricular systolic pressure is 15.4 mmHg.  3. Left atrial size was severely dilated.  4. Right atrial size was severely dilated.  5. The mitral valve is normal in structure. Trivial mitral valve regurgitation.  6. The aortic valve is tricuspid. Aortic valve regurgitation is not visualized. No aortic stenosis is present.  7. The inferior vena  cava is dilated in size with <50% respiratory variability, suggesting right atrial pressure of 15 mmHg. FINDINGS  Left Ventricle: Left ventricular ejection fraction, by estimation, is 35 to 40%. The left ventricle has moderately decreased function. The left ventricle demonstrates global hypokinesis. Definity contrast agent was given IV to delineate the left ventricular endocardial borders. The left ventricular internal cavity size was normal in size. There is mild left ventricular hypertrophy. Left ventricular diastolic parameters are indeterminate. Right Ventricle: The right ventricular size is normal. Right vetricular wall thickness was not well visualized. Right ventricular systolic function is mildly reduced. There is severely elevated pulmonary artery systolic pressure. The tricuspid regurgitant velocity is 3.57 m/s, and with an assumed right atrial pressure of 15 mmHg, the estimated right ventricular systolic pressure is 00.8 mmHg. Left Atrium: Left atrial size was severely dilated. Right Atrium: Right atrial size was severely dilated. Pericardium: There is no evidence of pericardial effusion. Mitral Valve: The mitral valve is normal in structure. Trivial mitral valve regurgitation. MV peak gradient, 8.2 mmHg. The mean mitral valve gradient is 4.0 mmHg. Tricuspid Valve: The tricuspid valve is normal in structure. Tricuspid valve regurgitation is trivial. Aortic Valve: The aortic valve is tricuspid. Aortic valve regurgitation is not visualized. No aortic stenosis is present. Aortic valve mean gradient measures 3.7 mmHg. Aortic valve peak gradient measures 7.9 mmHg. Aortic valve area, by VTI measures 2.55 cm. Pulmonic Valve: The pulmonic valve was not well visualized. Pulmonic valve regurgitation is not visualized. Aorta: The aortic root is normal in size and structure. Venous: The inferior vena cava is dilated in size with less than 50% respiratory variability, suggesting right atrial pressure of 15 mmHg.  IAS/Shunts: The interatrial septum was not well visualized.  LEFT VENTRICLE PLAX 2D LVIDd:         5.40 cm   Diastology LVIDs:         4.55  cm   LV e' medial:    9.00 cm/s LV PW:         1.30 cm   LV E/e' medial:  14.7 LV IVS:        1.30 cm   LV e' lateral:   13.00 cm/s LVOT diam:     2.00 cm   LV E/e' lateral: 10.2 LV SV:         52 LV SV Index:   21 LVOT Area:     3.14 cm  RIGHT VENTRICLE RV Basal diam:  3.50 cm RV Mid diam:    2.50 cm LEFT ATRIUM              Index        RIGHT ATRIUM           Index LA diam:        6.30 cm  2.59 cm/m   RA Area:     33.50 cm LA Vol (A2C):   162.0 ml 66.55 ml/m  RA Volume:   120.00 ml 49.30 ml/m LA Vol (A4C):   170.0 ml 69.84 ml/m LA Biplane Vol: 167.0 ml 68.60 ml/m  AORTIC VALVE                    PULMONIC VALVE AV Area (Vmax):    2.30 cm     PV Vmax:       1.03 m/s AV Area (Vmean):   2.39 cm     PV Peak grad:  4.2 mmHg AV Area (VTI):     2.55 cm AV Vmax:           140.67 cm/s AV Vmean:          85.267 cm/s AV VTI:            0.202 m AV Peak Grad:      7.9 mmHg AV Mean Grad:      3.7 mmHg LVOT Vmax:         103.00 cm/s LVOT Vmean:        64.950 cm/s LVOT VTI:          0.164 m LVOT/AV VTI ratio: 0.81  AORTA Ao Root diam: 3.30 cm MITRAL VALVE                TRICUSPID VALVE MV Area (PHT): 4.15 cm     TR Peak grad:   51.0 mmHg MV Area VTI:   1.91 cm     TR Vmax:        357.00 cm/s MV Peak grad:  8.2 mmHg MV Mean grad:  4.0 mmHg     SHUNTS MV Vmax:       1.43 m/s     Systemic VTI:  0.16 m MV Vmean:      87.7 cm/s    Systemic Diam: 2.00 cm MV Decel Time: 183 msec MV E velocity: 132.00 cm/s Oswaldo Milian MD Electronically signed by Oswaldo Milian MD Signature Date/Time: 04/14/2021/12:02:52 PM    Final (Updated)      Assessment and Plan:   1. Worsening dyspnea on exertion - suspect multifactorial in context of CHF as well as severe baseline COPD, deconditioning, AF RVR, +/- contribution of decline of 4g Hgb drop from recent fall - also reported low grade  fever prior to admission - further management of COPD per primary team - cardiology to manage HF + AFib as below  2. Acute on chronic systolic CHF - volume status challenging given body  habitus but CXR without significant fluid and legs not markedly edematous - continue to hold Farxiga, spironolactone due to soft blood pressure at present time - we will also hold Entresto until BP improves as well - etiology for his declining BP is not entirely clear, although I do question contribution from anemia related to recent fall (baseline Hgb 16.8 in 12/2020 -> declined to 12 range) which certainly can be a stressor in the context of his poor reserve with severe COPD - will re-order daily weights - will review further with Dr. Marlou Porch  3. Permanent atrial fibrillation with RVR - was on Toprol 1m BID PTA, now on Lopressor 233mBID which may explain some of his tachycardia in conjunction with his Hgb drop and respiratory demand - with soft BP and decompensated CHF, we will hold off titration of metoprolol but continue to follow - HR has been gradually improving with diuresis, currently around 105bpm - hold off digoxin given CKD  - will check baseline TSH - ? resume Coumadin per pharmacy when [place]ith primary team (see #4)  4. Recent fall with significant bruising and acute blood loss anemia - with extensive bruising, I discussed with Dr. EmDenton Brickhether there needs to be consideration for additional imaging  - he is not markedly anemic but baseline was 16.8 in June 2022 so did experience a significant drop to the 12 range - consider PT eval this admission  5. Pulmonary HTN, severe - has prior history of this with workup in 2019 felt to have no significant pulmonary HTN - now with severely elevated PASP on echocardiogram - would raise question of contribution from CHF and COPD possibly at the point of chronic respiratory failure requiring home O2 again - will discuss consideration of clinical  surveillance vs RHC with MD  6. NSVT  (28 beats yesterday) - continue beta blocker - K, Mg at goal  7. CKD stage III (borderline a-b) - Cr similar to 2016  Risk Assessment/Risk Scores:        New York Heart Association (NYHA) Functional Class NYHA Class III-IV on arrival  CHA2DS2-VASc Score = 7   This indicates a 11.2% annual risk of stroke. The patient's score is based upon: CHF History: 1 HTN History: 1 Diabetes History: 1 Stroke History: 2 Vascular Disease History: 1 Age Score: 1 Gender Score: 0        For questions or updates, please contact [facility] Please consult [email or web address] for contact info under    Signed, DaCharlie PitterPA-C  04/15/2021 11:50 AM  Personally seen and examined. Agree with above.  [age]ear old with known cardiomyopathy status [place] on a business trip with permanent atrial [facility] with recurrence diabetes hypertension morbid obesity chronic systolic heart failure EF 30 to 35%.  Comes here with worsening shortness of breath.  On October 1 he had a terrible fall in his shower.  He was sitting on his bench in the shower got up felt dizzy quickly then fainted fell backwards onto his commode.  He has ecchymosis of his backside.  His hemoglobin decreased as well down to 12 from prior hemoglobin of approximately 16.  He is on Coumadin at home.  He tells me that Dr. ZaAlroy Dustis prior cardiologist placed him on Entresto.  This helped him get off of his oxygen.  He was very impressed.  Overall, on physical exam he is chronic mild lower extremity  edema but still feels quite short of breath with any significant exertional activity.  No orthopnea presently.  Echocardiogram here yesterday showed EF of 35 to 40% with pulmonary pressures of 66 mmHg.  Left atrium was severely dilated.  His troponin was mildly elevated and flat at 118 compatible with demand ischemia in  the setting of his heart failure exacerbation.  Creatinine also 1.62 indicative of chronic kidney disease stage IIIa.  Acute on chronic systolic heart failure - Continue to hold Farxiga and spironolactone secondary to soft blood pressures.  We will also hold his Entresto low-dose 24/26 until his blood pressure improves as well.  I wonder if his syncopal episode at home was a result of hypotension.  We will try her best to get him back on goal-directed medical therapy as soon as possible. -Utilizing IV Lasix  Atrial fibrillation permanent - Fairly reasonable rate control.  Tachycardia may be present because of heart failure exacerbation.  Difficult to know what came first.  He has failed prior ablative efforts.  Left atrium is severely dilated.  Would not likely hold cardioversion. -I think it is reasonable since it is since October 1 to go ahead and restart warfarin per pharmacy team.  No bridge.  Hemoglobin has been stable.  I do not think he is having any active bleeding.  Severe secondary pulmonary pretension - Multifactorial from left heart failure as well as COPD. -For now continue with goal-directed medical therapy.  Candee Furbish, MD

## 2021-04-16 DIAGNOSIS — I5023 Acute on chronic systolic (congestive) heart failure: Secondary | ICD-10-CM

## 2021-04-16 DIAGNOSIS — I4821 Permanent atrial fibrillation: Secondary | ICD-10-CM

## 2021-04-16 LAB — PROTIME-INR
INR: 1.4 — ABNORMAL HIGH (ref 0.8–1.2)
Prothrombin Time: 17 seconds — ABNORMAL HIGH (ref 11.4–15.2)

## 2021-04-16 LAB — RENAL FUNCTION PANEL
Albumin: 3.7 g/dL (ref 3.5–5.0)
Anion gap: 9 (ref 5–15)
BUN: 47 mg/dL — ABNORMAL HIGH (ref 8–23)
CO2: 25 mmol/L (ref 22–32)
Calcium: 9.1 mg/dL (ref 8.9–10.3)
Chloride: 104 mmol/L (ref 98–111)
Creatinine, Ser: 1.25 mg/dL — ABNORMAL HIGH (ref 0.61–1.24)
GFR, Estimated: >60 mL/min (ref >60)
Glucose, Bld: 210 mg/dL — ABNORMAL HIGH (ref 70–99)
Phosphorus: 3.6 mg/dL (ref 2.5–4.6)
Potassium: 4.1 mmol/L (ref 3.5–5.1)
Sodium: 138 mmol/L (ref 135–145)

## 2021-04-16 LAB — CBC
HCT: 46.2 % (ref 39.0–52.0)
Hemoglobin: 13.7 g/dL (ref 13.0–17.0)
MCH: 29.3 pg (ref 26.0–34.0)
MCHC: 29.7 g/dL — ABNORMAL LOW (ref 30.0–36.0)
MCV: 98.7 fL (ref 80.0–100.0)
Platelets: 185 10*3/uL (ref 150–400)
RBC: 4.68 MIL/uL (ref 4.22–5.81)
RDW: 17.2 % — ABNORMAL HIGH (ref 11.5–15.5)
WBC: 9.8 10*3/uL (ref 4.0–10.5)
nRBC: 0.3 % — ABNORMAL HIGH (ref 0.0–0.2)

## 2021-04-16 LAB — GLUCOSE, CAPILLARY
Glucose-Capillary: 183 mg/dL — ABNORMAL HIGH (ref 70–99)
Glucose-Capillary: 217 mg/dL — ABNORMAL HIGH (ref 70–99)
Glucose-Capillary: 185 mg/dL — ABNORMAL HIGH (ref 70–99)
Glucose-Capillary: 266 mg/dL — ABNORMAL HIGH (ref 70–99)

## 2021-04-16 LAB — MAGNESIUM: Magnesium: 2.4 mg/dL (ref 1.7–2.4)

## 2021-04-16 MED ORDER — DAPAGLIFLOZIN PROPANEDIOL 10 MG PO TABS
10.0000 mg | ORAL_TABLET | Freq: Every day | ORAL | Status: DC
  Administered 2021-04-17 – 2021-04-19 (×3): 10 mg via ORAL
  Filled 2021-04-16 (×5): qty 1

## 2021-04-16 MED ORDER — LEVALBUTEROL HCL 0.63 MG/3ML IN NEBU
0.6300 mg | INHALATION_SOLUTION | Freq: Two times a day (BID) | RESPIRATORY_TRACT | Status: DC
  Administered 2021-04-16 – 2021-04-19 (×6): 0.63 mg via RESPIRATORY_TRACT
  Filled 2021-04-16 (×6): qty 3

## 2021-04-16 MED ORDER — DIGOXIN 125 MCG PO TABS
0.1250 mg | ORAL_TABLET | Freq: Every day | ORAL | Status: DC
  Administered 2021-04-16 – 2021-04-19 (×4): 0.125 mg via ORAL
  Filled 2021-04-16 (×4): qty 1

## 2021-04-16 MED ORDER — APIXABAN 5 MG PO TABS
5.0000 mg | ORAL_TABLET | Freq: Two times a day (BID) | ORAL | Status: DC
  Administered 2021-04-16 – 2021-04-19 (×7): 5 mg via ORAL
  Filled 2021-04-16 (×7): qty 1

## 2021-04-16 MED ORDER — SACUBITRIL-VALSARTAN 24-26 MG PO TABS
1.0000 | ORAL_TABLET | Freq: Two times a day (BID) | ORAL | Status: DC
  Administered 2021-04-16 – 2021-04-19 (×7): 1 via ORAL
  Filled 2021-04-16 (×7): qty 1

## 2021-04-16 MED ORDER — FUROSEMIDE 10 MG/ML IJ SOLN
40.0000 mg | Freq: Two times a day (BID) | INTRAMUSCULAR | Status: DC
  Administered 2021-04-16 – 2021-04-19 (×6): 40 mg via INTRAVENOUS
  Filled 2021-04-16 (×6): qty 4

## 2021-04-16 MED ORDER — METOPROLOL TARTRATE 50 MG PO TABS
50.0000 mg | ORAL_TABLET | Freq: Four times a day (QID) | ORAL | Status: DC
Start: 2021-04-16 — End: 2021-04-18
  Administered 2021-04-16 – 2021-04-18 (×7): 50 mg via ORAL
  Filled 2021-04-16 (×8): qty 1

## 2021-04-16 MED ORDER — FUROSEMIDE 10 MG/ML IJ SOLN
20.0000 mg | Freq: Once | INTRAMUSCULAR | Status: AC
  Administered 2021-04-16: 20 mg via INTRAVENOUS
  Filled 2021-04-16: qty 2

## 2021-04-16 NOTE — TOC Initial Note (Signed)
Transition of Care [facility]) - Initial/Assessment Note    Patient Details  Name: Tommy Riley MRN: [id number] Date of Birth: 13-May-1946  Transition of Care [facility]) CM/SW Contact:    Shade Flood, LCSW Phone Number: 04/16/2021, 11:13 AM  Clinical Narrative:                  Pt admitted from his home in [place], [place]. MD anticipating dc Friday. Pt would benefit from [facility] RN follow up at dc. Spoke with pt today to review dc planning and possible referral for [facility] RN. Pt agreeable to [facility]. CMS provider options reviewed with pt and referred as requested.  Assigned TOC will follow and continue to assist with dc planning.  Expected Discharge Plan: [place] Barriers to Discharge: Continued Medical Work up   Patient Goals and CMS Choice Patient states their goals for this hospitalization and ongoing recovery are:: go home CMS Medicare.gov Compare Post Acute Care list provided to:: Patient Choice offered to / list presented to : Patient  Expected Discharge Plan and Services Expected Discharge Plan: [place] In-house Referral: Clinical Social Work   Post Acute Care Choice: [place] arrangements for the past 2 months: [place]: RN [place] Agency: [place] ([place]) Date [place]: 04/16/21   Representative spoke with at [place]: Vaughan Basta  Prior Living Arrangements/Services Living arrangements for the past 2 months: [place] with:: Spouse Patient language and need for interpreter reviewed:: Yes Do you feel safe going back to the place where you live?: Yes      Need for Family Participation in Patient Care: No (Comment) Care giver support system in place?: Yes (comment)   Criminal Activity/Legal Involvement Pertinent to Current Situation/Hospitalization: No - Comment as needed  Activities of Daily Living      Permission Sought/Granted Permission  sought to share information with : Facility Art therapist granted to share information with : Yes, Verbal Permission Granted     Permission granted to share info w AGENCY: Advanced        Emotional Assessment   Attitude/Demeanor/Rapport: Engaged Affect (typically observed): Pleasant Orientation: : Oriented to Self, Oriented to Place, Oriented to  Time, Oriented to Situation Alcohol / Substance Use: Not Applicable Psych Involvement: No (comment)  Admission diagnosis:  Shortness of breath [R06.02] COPD exacerbation (HCC) [J44.1] Congestive heart failure, unspecified HF chronicity, unspecified heart failure type ([place]) [I50.9] Acute respiratory failure with hypoxia ([place]) [J96.01] Patient Active Problem List   Diagnosis Date Noted   Elevated brain natriuretic peptide (BNP) level 04/13/2021   Elevated troponin I level 04/13/2021   Hyperglycemia due to diabetes mellitus ([place]) 04/13/2021   Acute respiratory failure with hypoxia ([place]) 04/13/2021   Shortness of breath 04/13/2021   Fall at home, initial encounter 04/13/2021   Ecchymosis 04/13/2021   Encounter for therapeutic drug monitoring 12/09/2020   Pneumonia due to COVID-19 virus 04/04/2020   Chronic respiratory failure with hypoxia ([place]) 08/29/2018   Other secondary pulmonary hypertension ([place]) 07/29/2018   Cough 07/29/2018   Non-nicotine vapor product user 07/29/2018   COPD (chronic obstructive pulmonary disease) ([place]) 12/14/2014   ASHD (arteriosclerotic heart disease) 12/14/2014   S/P CABG (coronary artery bypass graft) 12/14/2014   Atrial fibrillation ([place]) 12/14/2014   HBP (high blood pressure) 12/14/2014  Acute exacerbation of CHF (congestive heart failure) ([place]) 12/14/2014   Obesity 12/14/2014   OSA on CPAP 12/14/2014   Diabetes mellitus type 2 with complications ([place]) 35/68/6168   PCP:  Clinton Quant, MD Pharmacy:   [place], [place] [address] [place] [address] Phone: [phone] Fax: [phone]     Social Determinants of Health (SDOH) Interventions    Readmission Risk Interventions Readmission Risk Prevention Plan 04/16/2021  Transportation Screening Complete  Home Care Screening Complete  Medication Review (RN CM) Complete  Some recent data might be hidden

## 2021-04-16 NOTE — Progress Notes (Signed)
Patient asleep. HR ranging from 103-115 at this time

## 2021-04-16 NOTE — Progress Notes (Signed)
Cardiology Progress Note  Patient ID: KEMO SPRUCE MRN: [id number] DOB: [birth year] Date of Encounter: 04/16/2021  Primary Cardiologist: Rozann Lesches, MD  Subjective   Chief Complaint: SOB  HPI: A. fib with RVR this morning.  Still short of breath.  Does not appear that volume overloaded to me.  ROS:  All other ROS reviewed and negative. Pertinent positives noted in the HPI.     Inpatient Medications  Scheduled Meds:  aspirin  81 mg Oral Daily   dextromethorphan-guaiFENesin  1 tablet Oral BID   digoxin  0.125 mg Oral Daily   ezetimibe  10 mg Oral Daily   fluticasone furoate-vilanterol  1 puff Inhalation Daily   furosemide  20 mg Intravenous Once   furosemide  40 mg Intravenous Q12H   insulin aspart  0-15 Units Subcutaneous TID WC   insulin aspart  0-5 Units Subcutaneous QHS   insulin aspart  2 Units Subcutaneous TID WC   insulin detemir  8 Units Subcutaneous BID   metoprolol tartrate  50 mg Oral Q6H   omega-3 acid ethyl esters  1 g Oral Daily   potassium chloride  10 mEq Oral BID   predniSONE  40 mg Oral Q breakfast   rosuvastatin  40 mg Oral Daily   sacubitril-valsartan  1 tablet Oral BID   umeclidinium bromide  1 puff Inhalation Daily   Warfarin - Pharmacist Dosing Inpatient   Does not apply q1600   Continuous Infusions:  PRN Meds: acetaminophen **OR** acetaminophen, levalbuterol, zolpidem   Vital Signs   Vitals:   04/15/21 2120 04/16/21 0539 04/16/21 0548 04/16/21 0834  BP: 101/72  (!) 133/107 112/60  Pulse: (!) 110  68   Resp: 19  17   Temp: 97.6 F (36.4 C)  98.4 F (36.9 C)   TempSrc: Oral     SpO2: 98%  100%   Weight:  133.3 kg      Intake/Output Summary (Last 24 hours) at 04/16/2021 0911 Last data filed at 04/15/2021 2000 Gross per 24 hour  Intake 2160 ml  Output 1650 ml  Net 510 ml   Last 3 Weights 04/16/2021 04/15/2021 12/06/2020  Weight (lbs) 293 lb 14 oz 287 lb 0.6 oz 277 lb  Weight (kg) 133.3 kg 130.2 kg 125.646 kg       Telemetry  Overnight telemetry shows A. fib heart rates up into the 170s, which I personally reviewed.   ECG  The most recent ECG shows A. fib heart rate 125, left bundle branch block, which I personally reviewed.   Physical Exam   Vitals:   04/15/21 2120 04/16/21 0539 04/16/21 0548 04/16/21 0834  BP: 101/72  (!) 133/107 112/60  Pulse: (!) 110  68   Resp: 19  17   Temp: 97.6 F (36.4 C)  98.4 F (36.9 C)   TempSrc: Oral     SpO2: 98%  100%   Weight:  133.3 kg      Intake/Output Summary (Last 24 hours) at 04/16/2021 0911 Last data filed at 04/15/2021 2000 Gross per 24 hour  Intake 2160 ml  Output 1650 ml  Net 510 ml    Last 3 Weights 04/16/2021 04/15/2021 12/06/2020  Weight (lbs) 293 lb 14 oz 287 lb 0.6 oz 277 lb  Weight (kg) 133.3 kg 130.2 kg 125.646 kg    Body mass index is 40.42 kg/m.   General: Well nourished, well developed, in no acute distress Head: Atraumatic, normal size  Eyes: PEERLA, EOMI  Neck: Supple, neck adiposity precludes  JVD assessment Endocrine: No thryomegaly Cardiac: Normal S1, S2; irregular rhythm, tachycardia noted Lungs: Diminished breath sounds bilaterally Abd: Soft, nontender, no hepatomegaly  Ext: Trace edema Musculoskeletal: No deformities, BUE and BLE strength normal and equal Skin: Warm and dry, no rashes   Neuro: Alert and oriented to person, place, time, and situation, CNII-XII grossly intact, no focal deficits  Psych: Normal mood and affect   Labs  High Sensitivity Troponin:   Recent Labs  Lab 04/13/21 1724 04/13/21 1933  TROPONINIHS 127* 118*     Cardiac EnzymesNo results for input(s): TROPONINI in the last 168 hours. No results for input(s): TROPIPOC in the last 168 hours.  Chemistry Recent Labs  Lab 04/13/21 1724 04/14/21 0520 04/16/21 0637  NA 138 137 138  K 4.8 4.8 4.1  CL 102 101 104  CO2 27 25 25   GLUCOSE 128* 234* 210*  BUN 42* 43* 47*  CREATININE 1.64* 1.62* 1.25*  CALCIUM 8.9 9.0 9.1  PROT  --  6.9  --    ALBUMIN  --  3.6 3.7  AST  --  34  --   ALT  --  44  --   ALKPHOS  --  87  --   BILITOT  --  1.3*  --   GFRNONAA 44* 45* >60  ANIONGAP 9 11 9     Hematology Recent Labs  Lab 04/13/21 1724 04/14/21 0520 04/16/21 0637  WBC 6.8 7.0 9.8  RBC 4.17* 4.24 4.68  HGB 12.4* 12.6* 13.7  HCT 40.2 40.7 46.2  MCV 96.4 96.0 98.7  MCH 29.7 29.7 29.3  MCHC 30.8 31.0 29.7*  RDW 17.2* 17.1* 17.2*  PLT 153 161 185   BNP Recent Labs  Lab 04/13/21 1724  BNP 305.0*    DDimer No results for input(s): DDIMER in the last 168 hours.   Radiology  ECHOCARDIOGRAM COMPLETE  Result Date: 04/14/2021    ECHOCARDIOGRAM REPORT   Patient Name:   AJMAL KATHAN Date of Exam: 04/14/2021 Medical Rec #:  [id number]        Height:       71.5 in Accession #:    [id number]       Weight:       277.0 lb Date of Birth:  Apr 07, 1946       BSA:          2.434 m Patient Age:    75 years         BP:           108//60 mmHg Patient Gender: M                HR:           105 bpm. Exam Location:  [facility] Procedure: 2D Echo, Cardiac Doppler and Color Doppler Indications:     CHF-Acute Diastolic  History:         Patient has no prior history of Echocardiogram examinations.                  CAD, Prior CABG, COPD, Arrythmias:Atrial Fibrillation,                  Signs/Symptoms:Shortness of Breath; Risk Factors:Hypertension                  and Diabetes.  Sonographer:     Wenda Low Referring Phys:  [id number] OLADAPO ADEFESO Diagnosing Phys: Oswaldo Milian MD IMPRESSIONS  1. Left ventricular ejection fraction, by estimation, is 35 to 40%. The  left ventricle has moderately decreased function. The left ventricle demonstrates global hypokinesis. There is mild left ventricular hypertrophy. Left ventricular diastolic parameters are indeterminate. In Afib with RVR during study, consider repeat limited echo to better evaluate systolic function once rates better controlled.  2. Right ventricular systolic function is mildly reduced.  The right ventricular size is normal. There is severely elevated pulmonary artery systolic pressure. The estimated right ventricular systolic pressure is 62.2 mmHg.  3. Left atrial size was severely dilated.  4. Right atrial size was severely dilated.  5. The mitral valve is normal in structure. Trivial mitral valve regurgitation.  6. The aortic valve is tricuspid. Aortic valve regurgitation is not visualized. No aortic stenosis is present.  7. The inferior vena cava is dilated in size with <50% respiratory variability, suggesting right atrial pressure of 15 mmHg. FINDINGS  Left Ventricle: Left ventricular ejection fraction, by estimation, is 35 to 40%. The left ventricle has moderately decreased function. The left ventricle demonstrates global hypokinesis. Definity contrast agent was given IV to delineate the left ventricular endocardial borders. The left ventricular internal cavity size was normal in size. There is mild left ventricular hypertrophy. Left ventricular diastolic parameters are indeterminate. Right Ventricle: The right ventricular size is normal. Right vetricular wall thickness was not well visualized. Right ventricular systolic function is mildly reduced. There is severely elevated pulmonary artery systolic pressure. The tricuspid regurgitant velocity is 3.57 m/s, and with an assumed right atrial pressure of 15 mmHg, the estimated right ventricular systolic pressure is 29.7 mmHg. Left Atrium: Left atrial size was severely dilated. Right Atrium: Right atrial size was severely dilated. Pericardium: There is no evidence of pericardial effusion. Mitral Valve: The mitral valve is normal in structure. Trivial mitral valve regurgitation. MV peak gradient, 8.2 mmHg. The mean mitral valve gradient is 4.0 mmHg. Tricuspid Valve: The tricuspid valve is normal in structure. Tricuspid valve regurgitation is trivial. Aortic Valve: The aortic valve is tricuspid. Aortic valve regurgitation is not visualized. No  aortic stenosis is present. Aortic valve mean gradient measures 3.7 mmHg. Aortic valve peak gradient measures 7.9 mmHg. Aortic valve area, by VTI measures 2.55 cm. Pulmonic Valve: The pulmonic valve was not well visualized. Pulmonic valve regurgitation is not visualized. Aorta: The aortic root is normal in size and structure. Venous: The inferior vena cava is dilated in size with less than 50% respiratory variability, suggesting right atrial pressure of 15 mmHg. IAS/Shunts: The interatrial septum was not well visualized.  LEFT VENTRICLE PLAX 2D LVIDd:         5.40 cm   Diastology LVIDs:         4.55 cm   LV e' medial:    9.00 cm/s LV PW:         1.30 cm   LV E/e' medial:  14.7 LV IVS:        1.30 cm   LV e' lateral:   13.00 cm/s LVOT diam:     2.00 cm   LV E/e' lateral: 10.2 LV SV:         52 LV SV Index:   21 LVOT Area:     3.14 cm  RIGHT VENTRICLE RV Basal diam:  3.50 cm RV Mid diam:    2.50 cm LEFT ATRIUM              Index        RIGHT ATRIUM           Index LA diam:  6.30 cm  2.59 cm/m   RA Area:     33.50 cm LA Vol (A2C):   162.0 ml 66.55 ml/m  RA Volume:   120.00 ml 49.30 ml/m LA Vol (A4C):   170.0 ml 69.84 ml/m LA Biplane Vol: 167.0 ml 68.60 ml/m  AORTIC VALVE                    PULMONIC VALVE AV Area (Vmax):    2.30 cm     PV Vmax:       1.03 m/s AV Area (Vmean):   2.39 cm     PV Peak grad:  4.2 mmHg AV Area (VTI):     2.55 cm AV Vmax:           140.67 cm/s AV Vmean:          85.267 cm/s AV VTI:            0.202 m AV Peak Grad:      7.9 mmHg AV Mean Grad:      3.7 mmHg LVOT Vmax:         103.00 cm/s LVOT Vmean:        64.950 cm/s LVOT VTI:          0.164 m LVOT/AV VTI ratio: 0.81  AORTA Ao Root diam: 3.30 cm MITRAL VALVE                TRICUSPID VALVE MV Area (PHT): 4.15 cm     TR Peak grad:   51.0 mmHg MV Area VTI:   1.91 cm     TR Vmax:        357.00 cm/s MV Peak grad:  8.2 mmHg MV Mean grad:  4.0 mmHg     SHUNTS MV Vmax:       1.43 m/s     Systemic VTI:  0.16 m MV Vmean:      87.7 cm/s     Systemic Diam: 2.00 cm MV Decel Time: 183 msec MV E velocity: 132.00 cm/s Oswaldo Milian MD Electronically signed by Oswaldo Milian MD Signature Date/Time: 04/14/2021/12:02:52 PM    Final (Updated)     Cardiac Studies  TTE 04/14/2021  1. Left ventricular ejection fraction, by estimation, is 35 to 40%. The  left ventricle has moderately decreased function. The left ventricle  demonstrates global hypokinesis. There is mild left ventricular  hypertrophy. Left ventricular diastolic  parameters are indeterminate. In Afib with RVR during study, consider  repeat limited echo to better evaluate systolic function once rates better  controlled.   2. Right ventricular systolic function is mildly reduced. The right  ventricular size is normal. There is severely elevated pulmonary artery  systolic pressure. The estimated right ventricular systolic pressure is  98.9 mmHg.   3. Left atrial size was severely dilated.   4. Right atrial size was severely dilated.   5. The mitral valve is normal in structure. Trivial mitral valve  regurgitation.   6. The aortic valve is tricuspid. Aortic valve regurgitation is not  visualized. No aortic stenosis is present.   7. The inferior vena cava is dilated in size with <50% respiratory  variability, suggesting right atrial pressure of 15 mmHg.   [place] Records Subsequent available records from [place] reviewed under Media: - [place] (?this is the date) - 30-35% glob HK, inf wall AK/LM:OK/LAD: TO prox, DX1 90%/LCx: OM1 TO, LCx 50% distal/RCA: TO ostium, LIMA LAD: distal apical LAD has 80-90%, suspect culprit vessel/SVG PDA OK,  med tx - 10213 Lexiscan positive middistal Cx, RV dilated, EF 40% - Adenosine MPI 08/2018 EF 37%, poor quality study, apical infarct as well as inferior septal wall infarct, no obvious ischemia, however, LV does dilate with stress.  - 01/10/20 TTE suboptimal in spite of definity, EF 40%, mild-moderate MR, mild-moderate TR - Hx  of pulmonary HTN, seen by [facility] by Dr. Gilles Chiquito, apparently mild pulmonary HTN, 07/14/17 [facility] at [facility] - per Dr. Gilles Chiquito normal CO and PVR, no pulmonary HTN at that time  Patient Profile  ALYAAN BUDZYNSKI is a 75 y.o. male with CAD status post CABG, persistent atrial fibrillation status post ablation (failed, now with rate control strategy), diabetes, COPD, left bundle branch block, CKD stage III, hypertension, hyperlipidemia, stroke, pulmonary hypertension who was admitted on 04/14/2021 for acute on chronic systolic heart failure.  Assessment & Plan   #Acute on chronic systolic heart failure #Presumed ischemic cardiomyopathy #EF 35 to 40% #Left bundle branch block, QRS 150 ms -He has been admitted to the hospital for IV diuresis.  Found to be volume overloaded also with COPD exacerbation.  He appears to be approaching euvolemia.  Creatinine is coming down.  I have increased his IV diuresis to 40 mg IV twice daily. -I really believe his A. fib is his issue here.  Also with a left bundle branch block and wide QRS.  He does have significant dyssynchrony.  I think he should be considered for an outpatient cardioversion with likely amiodarone therapy.  If not should be considered for potential CRT P.  A. fib likely could be problematic for this.  Again this will be work in progress.  For now we will continue with getting him euvolemic as well as with getting him back on guideline directed medical therapy. -No evidence of shock.  I have increased his metoprolol to tartrate to 50 mg every 6 hours.  We will add digoxin 0.125 mg daily.  This will also help with blood pressure.  I have added back Entresto 24-26 mg twice daily. -We will also add Farxiga 10 mg daily. -Given his rather difficult body habitus to assess volume status we will recheck a BNP tomorrow. -Hopefully we can add back Aldactone tomorrow.  #Afib with RVR #LBBB QRS 150 ms -Now with A. fib with RVR.  Likely driven by heart failure as well as COPD.   We have increased his metoprolol to tartrate to 50 mg every 6 hours.  We will also add digoxin 0.125 mg daily to help with rate control. -He has been on Coumadin for years.  He has suffered a recent fall with bruising and bleeding.  I believe Eliquis would be a safer drug for him.  I discussed this with him.  We will switch him to Eliquis 5 mg twice daily. -I also discussed with hospital medicine and they will work to see if we can get patient assistance forms for him.  #Pulmonary hypertension -Multifactorial in the setting of COPD and left-sided heart failure.  Continue with diuresis.  #CKD stage II/III -improving with diuresis  #Elevated troponin, demand ischemia in setting of acute decompensated systolic heart failure -No concerns for ACS.  No chest pain symptoms.  Suspect this is secondary to volume overload.  For questions or updates, please contact [place] Please consult [email or web address] for contact info under   Time Spent with Patient: I have spent a total of 35 minutes with patient reviewing hospital notes, telemetry, EKGs, labs and examining the patient as well as establishing  an assessment and plan that was discussed with the patient.  > 50% of time was spent in direct patient care.    Signed, Addison Naegeli. Audie Box, MD, [place]  04/16/2021 9:11 AM

## 2021-04-16 NOTE — Progress Notes (Signed)
Patient using home CPAP machine independently. Insured everything was hooked up appropriately and O2 is ready and bled in through machine. Told pt to call if he needed anything else.

## 2021-04-16 NOTE — Progress Notes (Signed)
Patient Demographics:    Tommy Riley, is a 75 y.o. male, DOB - 1945/09/01, XYB:[id number]  Admit date - 04/13/2021   Admitting Physician Mauri Tolen Denton Brick, MD  Outpatient Primary MD for the patient is Pomposini, [name], MD  LOS - 2   Chief Complaint  Patient presents with   Shortness of Breath        Subjective:    Tommy Riley today has no fevers, no emesis,  No chest pain,   -Dyspnea on exertion persist hypoxia persist  Assessment  & Plan :    Principal Problem:   Acute exacerbation of CHF (congestive heart failure) ([place]) Active Problems:   COPD (chronic obstructive pulmonary disease) (HCC)   S/P CABG (coronary artery bypass graft)   Atrial fibrillation (HCC)   OSA on CPAP   Diabetes mellitus type 2 with complications (HCC)   Chronic respiratory failure with hypoxia (HCC)   Elevated brain natriuretic peptide (BNP) level   Elevated troponin I level   Hyperglycemia due to diabetes mellitus (HCC)   Acute respiratory failure with hypoxia (HCC)   Shortness of breath   Fall at home, initial encounter   Ecchymosis  Brief Summary:- 75 y.o. male with medical history significant for type 2 diabetes mellitus, hyperlipidemia, CHF, CAD s/p CABG, COPD, OSA on CPAP, gout admitted on 04/13/2021 with acute hypoxic respiratory failure due to combination of COPD and CHF exacerbation  A/p 1)Acute hypoxic respiratory failure--- due to combination of CHF and COPD exacerbation -Received steroids -BP is too soft to aggressively diurese  -Continue bronchodilators and mucolytics and prednisone  2)DM2-A1c 7.0 reflecting fair diabetic control PTA -Hyperglycemia most likely due to steroids, change Levemir to 8 units twice daily and adjust sliding scale coverage -Use Novolog/Humalog Sliding scale insulin with Accu-Cheks/Fingersticks as ordered   3)H/o CAD--status post prior CABG, no chest pains--- continue  metoprolol, -Troponin peaked at 127 troponin pattern not consistent with ACS -Crestor and Zetia as ordered -Cardiology consult appreciated  4) chronic atrial fibrillation---  -EF as below in #5, severe biatrial enlargement noted -Metoprolol increased for better rate control -Dr. Marisue Ivan from cardiology service recommends switching off Coumadin and starting Eliquis -Patient has significant dyssynchrony.  As per cardiologist patient should be considered for an outpatient cardioversion with likely amiodarone therapy.  If not should be considered for potential CRT P.     5)HFrEF/Pulm HTN-chronic systolic dysfunction CHF Echo from 04/14/2021 with EF of 35 to 40% -Global hypokinesis of the left ventricle noted -Cardiologist increase Lasix to 40 mg twice daily, increase metoprolol to 50 daily,, and added Farxiga and Entresto -If BP tolerates cardiologist would like to restart Aldactone -Soft BP limits therapeutic options at this time  6) CKD stage - 3A-  -Renal function appears clinically better -renally adjust medications, avoid nephrotoxic agents / dehydration  / hypotension  7) skin and soft tissue bruising/ecchymosis from fall while on Coumadin--- patient said he fell couple weeks prior to admission -X-[facility] of the hip and chest done at [facility] on 04/09/2021 without fractures -Please see pictures/photos in epic  8) obesity/OSA----CPAP nightly advised  9) acute anemia--hgb currently around 12 baseline usually around 16 =-Suspect partly due to recent ecchymosis/significant bruising please see photos in epic -No obvious bleeding at this time  10) generalized weakness and deconditioning--- patient will benefit from home health PT versus outpatient PT  Disposition/Need for in-Hospital Stay- patient unable to be discharged at this time due to --acute hypoxic respiratory failure secondary to combination of CHF and COPD requiring further interventions and gentle IV diuresis especially given  soft BP*  Status is: Inpatient  Remains inpatient appropriate because: Please see disposition above  Disposition: The patient is from: Home              Anticipated d/c is to: Home              Anticipated d/c date is: 2 days              Patient currently is not medically stable to d/c. Barriers: Not Clinically Stable-   Code Status :  -  Code Status: Full Code   Family Communication:    (patient is alert, awake and coherent)  Discussed with wife at bedside Consults  : Cardiology  DVT Prophylaxis  :   - SCDs   SCDs Start: 04/14/21 0059 apixaban (ELIQUIS) tablet 5 mg  Lab Results  Component Value Date   PLT 185 04/16/2021    Inpatient Medications  Scheduled Meds:  apixaban  5 mg Oral BID   aspirin  81 mg Oral Daily   dapagliflozin propanediol  10 mg Oral Daily   dextromethorphan-guaiFENesin  1 tablet Oral BID   digoxin  0.125 mg Oral Daily   ezetimibe  10 mg Oral Daily   fluticasone furoate-vilanterol  1 puff Inhalation Daily   furosemide  40 mg Intravenous Q12H   insulin aspart  0-15 Units Subcutaneous TID WC   insulin aspart  0-5 Units Subcutaneous QHS   insulin aspart  2 Units Subcutaneous TID WC   insulin detemir  8 Units Subcutaneous BID   levalbuterol  0.63 mg Nebulization BID   metoprolol tartrate  50 mg Oral Q6H   omega-3 acid ethyl esters  1 g Oral Daily   potassium chloride  10 mEq Oral BID   predniSONE  40 mg Oral Q breakfast   rosuvastatin  40 mg Oral Daily   sacubitril-valsartan  1 tablet Oral BID   umeclidinium bromide  1 puff Inhalation Daily   Continuous Infusions: PRN Meds:.acetaminophen **OR** acetaminophen, levalbuterol, zolpidem    Anti-infectives (From admission, onward)    None         Objective:   Vitals:   04/16/21 0834 04/16/21 0923 04/16/21 0951 04/16/21 1521  BP: 112/60  (!) 156/128 105/80  Pulse:   (!) 104 67  Resp:    20  Temp:    98.2 F (36.8 C)  TempSrc:    Oral  SpO2:  99%  99%  Weight:        Wt Readings from  Last 3 Encounters:  04/16/21 133.3 kg  12/06/20 125.6 kg  04/04/20 126.6 kg     Intake/Output Summary (Last 24 hours) at 04/16/2021 1859 Last data filed at 04/16/2021 1759 Gross per 24 hour  Intake 1200 ml  Output 1250 ml  Net -50 ml     Physical Exam  Gen:- Awake Alert, morbidly obese, in no apparent distress  HEENT:- [place].AT, No sclera icterus Nose- [place] 2L/min Neck-Supple Neck, Lungs-air movement is fair, no significant wheezing  CV- S1, S2 normal, irregularly irregular Abd-  +ve B.Sounds, Abd Soft, No tenderness, increased truncal adiposity Extremity/Skin:- +ve  edema, pedal pulses present  Psych-affect is appropriate, oriented x3 Neuro-no new focal deficits, no  tremors   Data Review:   Micro Results Recent Results (from the past 240 hour(s))  Resp Panel by RT-PCR (Flu A&B, Covid) Nasopharyngeal Swab     Status: None   Collection Time: 04/13/21  5:10 PM   Specimen: Nasopharyngeal Swab; Nasopharyngeal(NP) swabs in vial transport medium  Result Value Ref Range Status   SARS Coronavirus 2 by RT PCR NEGATIVE NEGATIVE Final    Comment: (NOTE) SARS-CoV-2 target nucleic acids are NOT DETECTED.  The SARS-CoV-2 RNA is generally detectable in upper respiratory specimens during the acute phase of infection. The lowest concentration of SARS-CoV-2 viral copies this assay can detect is 138 copies/mL. A negative result does not preclude SARS-Cov-2 infection and should not be used as the sole basis for treatment or other patient management decisions. A negative result may occur with  improper specimen collection/handling, submission of specimen other than nasopharyngeal swab, presence of viral mutation(s) within the areas targeted by this assay, and inadequate number of viral copies(<138 copies/mL). A negative result must be combined with clinical observations, patient history, and epidemiological information. The expected result is Negative.  Fact Sheet for Patients:   EntrepreneurPulse.com.au  Fact Sheet for Healthcare Providers:  IncredibleEmployment.be  This test is no t yet approved or cleared by the [place] FDA and  has been authorized for detection and/or diagnosis of SARS-CoV-2 by FDA under an Emergency Use Authorization (EUA). This EUA will remain  in effect (meaning this test can be used) for the duration of the COVID-19 declaration under Section 564(b)(1) of the Act, 21 U.S.C.section 360bbb-3(b)(1), unless the authorization is terminated  or revoked sooner.       Influenza A by PCR NEGATIVE NEGATIVE Final   Influenza B by PCR NEGATIVE NEGATIVE Final    Comment: (NOTE) The Xpert Xpress SARS-CoV-2/FLU/RSV plus assay is intended as an aid in the diagnosis of influenza from Nasopharyngeal swab specimens and should not be used as a sole basis for treatment. Nasal washings and aspirates are unacceptable for Xpert Xpress SARS-CoV-2/FLU/RSV testing.  Fact Sheet for Patients: EntrepreneurPulse.com.au  Fact Sheet for Healthcare Providers: IncredibleEmployment.be  This test is not yet approved or cleared by the [place] FDA and has been authorized for detection and/or diagnosis of SARS-CoV-2 by FDA under an Emergency Use Authorization (EUA). This EUA will remain in effect (meaning this test can be used) for the duration of the COVID-19 declaration under Section 564(b)(1) of the Act, 21 U.S.C. section 360bbb-3(b)(1), unless the authorization is terminated or revoked.  Performed at [facility] [address] [place], [place] [address]   Culture, blood (routine x 2)     Status: None (Preliminary result)   Collection Time: 04/13/21  5:40 PM   Specimen: BLOOD RIGHT HAND  Result Value Ref Range Status   Specimen Description BLOOD RIGHT HAND  Final   Special Requests   Final    BOTTLES DRAWN AEROBIC AND ANAEROBIC Blood Culture results may not be optimal due to  an excessive volume of blood received in culture bottles   Culture   Final    NO GROWTH 3 DAYS Performed at [facility], [address], [place], [place] [address]    Report Status PENDING  Incomplete  Culture, blood (routine x 2)     Status: None (Preliminary result)   Collection Time: 04/13/21  6:07 PM   Specimen: Left Antecubital; Blood  Result Value Ref Range Status   Specimen Description LEFT ANTECUBITAL  Final   Special Requests   Final  BOTTLES DRAWN AEROBIC AND ANAEROBIC Blood Culture adequate volume   Culture   Final    NO GROWTH 3 DAYS Performed at [facility], [address] [place], [place] [address]    Report Status PENDING  Incomplete    Radiology Reports DG Chest Port 1 View  Result Date: 04/13/2021 CLINICAL DATA:  Shortness of breath for 1 week that is worsening, cough, and fever starting 12 hours ago, history CHF, COPD, former smoker EXAM: PORTABLE CHEST 1 VIEW COMPARISON:  Portable exam 1733 hours compared to 04/09/2021 FINDINGS: Enlargement of cardiac silhouette post median sternotomy. Mediastinal contours and pulmonary vascularity normal. Atherosclerotic calcification aorta. Slight chronic accentuation of interstitial markings, stable. No acute infiltrate, pleural effusion, or pneumothorax. Osseous structures unremarkable. IMPRESSION: Enlargement of cardiac silhouette. No definite acute infiltrate. Electronically Signed   By: Lavonia Dana M.D.   On: 04/13/2021 18:00   ECHOCARDIOGRAM COMPLETE  Result Date: 04/14/2021    ECHOCARDIOGRAM REPORT   Patient Name:   MERCEDES VALERIANO Date of Exam: 04/14/2021 Medical Rec #:  [id number]        Height:       71.5 in Accession #:    [id number]       Weight:       277.0 lb Date of Birth:  March 29, 1946       BSA:          2.434 m Patient Age:    33 years         BP:           108//60 mmHg Patient Gender: M                HR:           105 bpm. Exam Location:  [facility] Procedure: 2D Echo, Cardiac Doppler and Color Doppler  Indications:     CHF-Acute Diastolic  History:         Patient has no prior history of Echocardiogram examinations.                  CAD, Prior CABG, COPD, Arrythmias:Atrial Fibrillation,                  Signs/Symptoms:Shortness of Breath; Risk Factors:Hypertension                  and Diabetes.  Sonographer:     Wenda Low Referring Phys:  [id number] OLADAPO ADEFESO Diagnosing Phys: Oswaldo Milian MD IMPRESSIONS  1. Left ventricular ejection fraction, by estimation, is 35 to 40%. The left ventricle has moderately decreased function. The left ventricle demonstrates global hypokinesis. There is mild left ventricular hypertrophy. Left ventricular diastolic parameters are indeterminate. In Afib with RVR during study, consider repeat limited echo to better evaluate systolic function once rates better controlled.  2. Right ventricular systolic function is mildly reduced. The right ventricular size is normal. There is severely elevated pulmonary artery systolic pressure. The estimated right ventricular systolic pressure is 41.9 mmHg.  3. Left atrial size was severely dilated.  4. Right atrial size was severely dilated.  5. The mitral valve is normal in structure. Trivial mitral valve regurgitation.  6. The aortic valve is tricuspid. Aortic valve regurgitation is not visualized. No aortic stenosis is present.  7. The inferior vena cava is dilated in size with <50% respiratory variability, suggesting right atrial pressure of 15 mmHg. FINDINGS  Left Ventricle: Left ventricular ejection fraction, by estimation, is 35 to 40%. The left ventricle has moderately decreased function.  The left ventricle demonstrates global hypokinesis. Definity contrast agent was given IV to delineate the left ventricular endocardial borders. The left ventricular internal cavity size was normal in size. There is mild left ventricular hypertrophy. Left ventricular diastolic parameters are indeterminate. Right Ventricle: The right  ventricular size is normal. Right vetricular wall thickness was not well visualized. Right ventricular systolic function is mildly reduced. There is severely elevated pulmonary artery systolic pressure. The tricuspid regurgitant velocity is 3.57 m/s, and with an assumed right atrial pressure of 15 mmHg, the estimated right ventricular systolic pressure is 94.7 mmHg. Left Atrium: Left atrial size was severely dilated. Right Atrium: Right atrial size was severely dilated. Pericardium: There is no evidence of pericardial effusion. Mitral Valve: The mitral valve is normal in structure. Trivial mitral valve regurgitation. MV peak gradient, 8.2 mmHg. The mean mitral valve gradient is 4.0 mmHg. Tricuspid Valve: The tricuspid valve is normal in structure. Tricuspid valve regurgitation is trivial. Aortic Valve: The aortic valve is tricuspid. Aortic valve regurgitation is not visualized. No aortic stenosis is present. Aortic valve mean gradient measures 3.7 mmHg. Aortic valve peak gradient measures 7.9 mmHg. Aortic valve area, by VTI measures 2.55 cm. Pulmonic Valve: The pulmonic valve was not well visualized. Pulmonic valve regurgitation is not visualized. Aorta: The aortic root is normal in size and structure. Venous: The inferior vena cava is dilated in size with less than 50% respiratory variability, suggesting right atrial pressure of 15 mmHg. IAS/Shunts: The interatrial septum was not well visualized.  LEFT VENTRICLE PLAX 2D LVIDd:         5.40 cm   Diastology LVIDs:         4.55 cm   LV e' medial:    9.00 cm/s LV PW:         1.30 cm   LV E/e' medial:  14.7 LV IVS:        1.30 cm   LV e' lateral:   13.00 cm/s LVOT diam:     2.00 cm   LV E/e' lateral: 10.2 LV SV:         52 LV SV Index:   21 LVOT Area:     3.14 cm  RIGHT VENTRICLE RV Basal diam:  3.50 cm RV Mid diam:    2.50 cm LEFT ATRIUM              Index        RIGHT ATRIUM           Index LA diam:        6.30 cm  2.59 cm/m   RA Area:     33.50 cm LA Vol (A2C):    162.0 ml 66.55 ml/m  RA Volume:   120.00 ml 49.30 ml/m LA Vol (A4C):   170.0 ml 69.84 ml/m LA Biplane Vol: 167.0 ml 68.60 ml/m  AORTIC VALVE                    PULMONIC VALVE AV Area (Vmax):    2.30 cm     PV Vmax:       1.03 m/s AV Area (Vmean):   2.39 cm     PV Peak grad:  4.2 mmHg AV Area (VTI):     2.55 cm AV Vmax:           140.67 cm/s AV Vmean:          85.267 cm/s AV VTI:  0.202 m AV Peak Grad:      7.9 mmHg AV Mean Grad:      3.7 mmHg LVOT Vmax:         103.00 cm/s LVOT Vmean:        64.950 cm/s LVOT VTI:          0.164 m LVOT/AV VTI ratio: 0.81  AORTA Ao Root diam: 3.30 cm MITRAL VALVE                TRICUSPID VALVE MV Area (PHT): 4.15 cm     TR Peak grad:   51.0 mmHg MV Area VTI:   1.91 cm     TR Vmax:        357.00 cm/s MV Peak grad:  8.2 mmHg MV Mean grad:  4.0 mmHg     SHUNTS MV Vmax:       1.43 m/s     Systemic VTI:  0.16 m MV Vmean:      87.7 cm/s    Systemic Diam: 2.00 cm MV Decel Time: 183 msec MV E velocity: 132.00 cm/s Oswaldo Milian MD Electronically signed by Oswaldo Milian MD Signature Date/Time: 04/14/2021/12:02:52 PM    Final (Updated)      CBC Recent Labs  Lab 04/13/21 1724 04/14/21 0520 04/16/21 0637  WBC 6.8 7.0 9.8  HGB 12.4* 12.6* 13.7  HCT 40.2 40.7 46.2  PLT 153 161 185  MCV 96.4 96.0 98.7  MCH 29.7 29.7 29.3  MCHC 30.8 31.0 29.7*  RDW 17.2* 17.1* 17.2*  LYMPHSABS 0.6*  --   --   MONOABS 0.6  --   --   EOSABS 0.1  --   --   BASOSABS 0.0  --   --     Chemistries  Recent Labs  Lab 04/13/21 1724 04/14/21 0520 04/16/21 0637  NA 138 137 138  K 4.8 4.8 4.1  CL 102 101 104  CO2 27 25 25   GLUCOSE 128* 234* 210*  BUN 42* 43* 47*  CREATININE 1.64* 1.62* 1.25*  CALCIUM 8.9 9.0 9.1  MG  --  2.3 2.4  AST  --  34  --   ALT  --  44  --   ALKPHOS  --  87  --   BILITOT  --  1.3*  --    ------------------------------------------------------------------------------------------------------------------ No results for input(s): CHOL,  HDL, LDLCALC, TRIG, CHOLHDL, LDLDIRECT in the last 72 hours.  Lab Results  Component Value Date   HGBA1C 7.0 (H) 04/13/2021   ------------------------------------------------------------------------------------------------------------------ Recent Labs    04/14/21 0520  TSH 0.541   ------------------------------------------------------------------------------------------------------------------ No results for input(s): VITAMINB12, FOLATE, FERRITIN, TIBC, IRON, RETICCTPCT in the last 72 hours.  Coagulation profile Recent Labs  Lab 04/13/21 1724 04/14/21 0520 04/16/21 0637  INR 1.8* 1.7* 1.4*    No results for input(s): DDIMER in the last 72 hours.  Cardiac Enzymes No results for input(s): CKMB, TROPONINI, MYOGLOBIN in the last 168 hours.  Invalid input(s): CK ------------------------------------------------------------------------------------------------------------------    Component Value Date/Time   BNP 305.0 (H) 04/13/2021 1724     Roxan Hockey M.D on 04/16/2021 at 6:59 PM  Go to [email or web address] - for contact info  [facility] - Office  [phone]

## 2021-04-16 NOTE — Progress Notes (Addendum)
[place] for warfarin_> eliquis Indication: atrial fibrillation   Assessment: 26 yom with hx of afib on warfarin PTA presenting with worsening dyspnea on exertion on 10/9. Noted recent fall with significant bruising and drop from baseline Hg 16, Cardiology and [facility] discussed continuation of warfarin per note (held on admission 10/9-10/10). Pharmacy consulted to resume warfarin inpatient 10/11. No obvious bleeding issues reported. INR 1.8 on admit, down to 1.7 yesterday. Hg low but stable at 12.6, plt wnl. . Currently on SCDs only.  INR 1.4 today, subtherapeutic  PTA warfarin dose: 4mg  daily except 3mg  on TThSa (last dose 10/8 PTA)  Goal of Therapy:  INR 2-3 Monitor platelets by anticoagulation protocol: Yes   Plan:  D/c warfarin, cardiology started eliquis Monitor CBC, s/sx bleeding  Tommy Riley, BS Tommy Riley, BCPS Clinical Pharmacist Pager [phone] 04/16/2021 9:32 AM

## 2021-04-17 LAB — BASIC METABOLIC PANEL
Anion gap: 8 (ref 5–15)
BUN: 53 mg/dL — ABNORMAL HIGH (ref 8–23)
CO2: 29 mmol/L (ref 22–32)
Calcium: 8.7 mg/dL — ABNORMAL LOW (ref 8.9–10.3)
Chloride: 101 mmol/L (ref 98–111)
Creatinine, Ser: 1.27 mg/dL — ABNORMAL HIGH (ref 0.61–1.24)
GFR, Estimated: >60 mL/min (ref >60)
Glucose, Bld: 184 mg/dL — ABNORMAL HIGH (ref 70–99)
Potassium: 4 mmol/L (ref 3.5–5.1)
Sodium: 138 mmol/L (ref 135–145)

## 2021-04-17 LAB — GLUCOSE, CAPILLARY
Glucose-Capillary: 188 mg/dL — ABNORMAL HIGH (ref 70–99)
Glucose-Capillary: 173 mg/dL — ABNORMAL HIGH (ref 70–99)
Glucose-Capillary: 222 mg/dL — ABNORMAL HIGH (ref 70–99)
Glucose-Capillary: 234 mg/dL — ABNORMAL HIGH (ref 70–99)

## 2021-04-17 LAB — BRAIN NATRIURETIC PEPTIDE: B Natriuretic Peptide: 382 pg/mL — ABNORMAL HIGH (ref 0.0–100.0)

## 2021-04-17 LAB — MAGNESIUM: Magnesium: 2.3 mg/dL (ref 1.7–2.4)

## 2021-04-17 MED ORDER — INSULIN DETEMIR 100 UNIT/ML ~~LOC~~ SOLN
12.0000 [IU] | Freq: Two times a day (BID) | SUBCUTANEOUS | Status: DC
  Administered 2021-04-17 – 2021-04-19 (×4): 12 [IU] via SUBCUTANEOUS
  Filled 2021-04-17 (×8): qty 0.12

## 2021-04-17 MED ORDER — INSULIN DETEMIR 100 UNIT/ML ~~LOC~~ SOLN
SUBCUTANEOUS | Status: AC
  Filled 2021-04-17: qty 1

## 2021-04-17 NOTE — Progress Notes (Signed)
Cardiology Progress Note  Patient ID: AD GUTTMAN MRN: [id number] DOB: February 14, 1946 Date of Encounter: 04/17/2021  Primary Cardiologist: Rozann Lesches, MD  Subjective   Shortness of breath is improving. He was net negative another 1.2L overnight.  Creatinine stable. BNP 382. Potassium 4.0. Remains volume overloaded. BP low normal today.  Inpatient Medications  Scheduled Meds:  apixaban  5 mg Oral BID   aspirin  81 mg Oral Daily   dapagliflozin propanediol  10 mg Oral Daily   dextromethorphan-guaiFENesin  1 tablet Oral BID   digoxin  0.125 mg Oral Daily   ezetimibe  10 mg Oral Daily   fluticasone furoate-vilanterol  1 puff Inhalation Daily   furosemide  40 mg Intravenous Q12H   insulin aspart  0-15 Units Subcutaneous TID WC   insulin aspart  0-5 Units Subcutaneous QHS   insulin aspart  2 Units Subcutaneous TID WC   insulin detemir  8 Units Subcutaneous BID   levalbuterol  0.63 mg Nebulization BID   metoprolol tartrate  50 mg Oral Q6H   omega-3 acid ethyl esters  1 g Oral Daily   potassium chloride  10 mEq Oral BID   predniSONE  40 mg Oral Q breakfast   rosuvastatin  40 mg Oral Daily   sacubitril-valsartan  1 tablet Oral BID   umeclidinium bromide  1 puff Inhalation Daily   Continuous Infusions:  PRN Meds: acetaminophen **OR** acetaminophen, levalbuterol, zolpidem   Vital Signs   Vitals:   04/17/21 0314 04/17/21 0500 04/17/21 0555 04/17/21 0728  BP: 112/63  109/83   Pulse:   92   Resp:   17   Temp:   98.5 F (36.9 C)   TempSrc:      SpO2:   100% 98%  Weight:  132 kg      Intake/Output Summary (Last 24 hours) at 04/17/2021 0920 Last data filed at 04/17/2021 0554 Gross per 24 hour  Intake 240 ml  Output 2000 ml  Net -1760 ml   Last 3 Weights 04/17/2021 04/16/2021 04/15/2021  Weight (lbs) 291 lb 0.1 oz 293 lb 14 oz 287 lb 0.6 oz  Weight (kg) 132 kg 133.3 kg 130.2 kg      Telemetry   Afib with RVR - personally reviewed  ECG   N/A  Physical  Exam   Vitals:   04/17/21 0314 04/17/21 0500 04/17/21 0555 04/17/21 0728  BP: 112/63  109/83   Pulse:   92   Resp:   17   Temp:   98.5 F (36.9 C)   TempSrc:      SpO2:   100% 98%  Weight:  132 kg      Intake/Output Summary (Last 24 hours) at 04/17/2021 0920 Last data filed at 04/17/2021 0554 Gross per 24 hour  Intake 240 ml  Output 2000 ml  Net -1760 ml    Last 3 Weights 04/17/2021 04/16/2021 04/15/2021  Weight (lbs) 291 lb 0.1 oz 293 lb 14 oz 287 lb 0.6 oz  Weight (kg) 132 kg 133.3 kg 130.2 kg    Body mass index is 40.02 kg/m.   General appearance: alert, no distress, and morbidly obese Neck: JVD - 3 cm above sternal notch, no carotid bruit, and thyroid not enlarged, symmetric, no tenderness/mass/nodules Lungs: clear to auscultation bilaterally Heart: irregularly irregular rhythm and tachycardic Abdomen: soft, non-tender; bowel sounds normal; no masses,  no organomegaly Extremities: edema 2+ and venous stasis dermatitis noted Pulses: 2+ and symmetric Skin: Skin color, texture, turgor normal. No rashes  or lesions Neurologic: Grossly normal Psych: Pleasant   Labs  High Sensitivity Troponin:   Recent Labs  Lab 04/13/21 1724 04/13/21 1933  TROPONINIHS 127* 118*     Cardiac EnzymesNo results for input(s): TROPONINI in the last 168 hours. No results for input(s): TROPIPOC in the last 168 hours.  Chemistry Recent Labs  Lab 04/14/21 0520 04/16/21 0637 04/17/21 0646  NA 137 138 138  K 4.8 4.1 4.0  CL 101 104 101  CO2 25 25 29   GLUCOSE 234* 210* 184*  BUN 43* 47* 53*  CREATININE 1.62* 1.25* 1.27*  CALCIUM 9.0 9.1 8.7*  PROT 6.9  --   --   ALBUMIN 3.6 3.7  --   AST 34  --   --   ALT 44  --   --   ALKPHOS 87  --   --   BILITOT 1.3*  --   --   GFRNONAA 45* >60 >60  ANIONGAP 11 9 8     Hematology Recent Labs  Lab 04/13/21 1724 04/14/21 0520 04/16/21 0637  WBC 6.8 7.0 9.8  RBC 4.17* 4.24 4.68  HGB 12.4* 12.6* 13.7  HCT 40.2 40.7 46.2  MCV 96.4 96.0  98.7  MCH 29.7 29.7 29.3  MCHC 30.8 31.0 29.7*  RDW 17.2* 17.1* 17.2*  PLT 153 161 185   BNP Recent Labs  Lab 04/13/21 1724 04/17/21 0646  BNP 305.0* 382.0*    DDimer No results for input(s): DDIMER in the last 168 hours.   Radiology  No results found.  Cardiac Studies  TTE 04/14/2021  1. Left ventricular ejection fraction, by estimation, is 35 to 40%. The  left ventricle has moderately decreased function. The left ventricle  demonstrates global hypokinesis. There is mild left ventricular  hypertrophy. Left ventricular diastolic  parameters are indeterminate. In Afib with RVR during study, consider  repeat limited echo to better evaluate systolic function once rates better  controlled.   2. Right ventricular systolic function is mildly reduced. The right  ventricular size is normal. There is severely elevated pulmonary artery  systolic pressure. The estimated right ventricular systolic pressure is  03.4 mmHg.   3. Left atrial size was severely dilated.   4. Right atrial size was severely dilated.   5. The mitral valve is normal in structure. Trivial mitral valve  regurgitation.   6. The aortic valve is tricuspid. Aortic valve regurgitation is not  visualized. No aortic stenosis is present.   7. The inferior vena cava is dilated in size with <50% respiratory  variability, suggesting right atrial pressure of 15 mmHg.   [place] Records Subsequent available records from [place] reviewed under Media: - [place] (?this is the date) - 30-35% glob HK, inf wall AK/LM:OK/LAD: TO prox, DX1 90%/LCx: OM1 TO, LCx 50% distal/RCA: TO ostium, LIMA LAD: distal apical LAD has 80-90%, suspect culprit vessel/SVG PDA OK, med tx - 10213 Lexiscan positive middistal Cx, RV dilated, EF 40% - Aden[facility] MPI 08/2018 EF 37%, poor quality study, apical infarct as well as inferior septal wall infarct, no obvious ischemia, however, LV does dilate with stress.  - 01/10/20 TTE suboptimal in spite of  definity, EF 40%, mild-moderate MR, mild-moderate TR - Hx of pulmonary HTN, seen by [facility] by Dr. Gilles Chiquito, apparently mild pulmonary HTN, 07/14/17 [facility]HC at [facility] - per Dr. Gilles Chiquito normal CO and PVR, no pulmonary HTN at that time  Patient Profile   Tommy Riley is a 75 y.o. male with CAD status post CABG, persistent atrial  fibrillation status post ablation (failed, now with rate control strategy), diabetes, COPD, left bundle branch block, CKD stage III, hypertension, hyperlipidemia, stroke, pulmonary hypertension who was admitted on 04/14/2021 for acute on chronic systolic heart failure.  Assessment & Plan   #Acute on chronic systolic heart failure #Presumed ischemic cardiomyopathy #EF 35 to 40% #Left bundle branch block, QRS 150 ms -He has been admitted to the hospital for IV diuresis.  Found to be volume overloaded also with COPD exacerbation.  He appears to be approaching euvolemia.  Creatinine is coming down.  I have increased his IV diuresis to 40 mg IV twice daily. -Continue IV BID lasix, creatinine stable -BP soft, cannot add additional HF meds now -Given his wide QRS/LBBB and low LVEF, I would agree that CRT-P/D therapy would be most beneficial long-term. His afib is poorly rate controlled, likely permanent given recurrence after ablation, severe LAE and failed DCCV's - best strategy may be AVN ablation and CRT-P/D with cardiac EP.  #Afib with RVR #LBBB QRS 150 ms -Now with A. fib with RVR.  Likely driven by heart failure as well as COPD.   - Continue metoprolol to tartrate 50 mg every 6 hours.  Digoxin 0.125 mg daily was added to help with rate control, but has not been effective. -Switched from warfarin to Eliquis 5 mg twice daily. -Ultimately, I believe he will need AVN ablation and CRT-P/D strategy.  #Pulmonary hypertension -Multifactorial in the setting of COPD and left-sided heart failure.  Continue with diuresis.  #CKD stage II/III -improving with diuresis  #Elevated troponin,  demand ischemia in setting of acute decompensated systolic heart failure -No concerns for ACS.  No chest pain symptoms.  Suspect this is secondary to volume overload.  For questions or updates, please contact [place] Please consult [email or web address] for contact info under   Time Spent with Patient: I have spent a total of 35 minutes with patient reviewing hospital notes, telemetry, EKGs, labs and examining the patient as well as establishing an assessment and plan that was discussed with the patient.  > 50% of time was spent in direct patient care.     Pixie Casino, MD, [facility], [place] Director of the Advanced Lipid Disorders &  Cardiovascular Risk Reduction Clinic Diplomate of the American Board of Clinical Lipidology Attending Cardiologist  Direct Dial: [phone]  Fax: [phone]  Website:  [email or web address]  04/17/2021 9:20 AM

## 2021-04-17 NOTE — Progress Notes (Signed)
Patient Demographics:    Tommy Riley, is a 75 y.o. male, DOB - [birth year], ZHY:[id number]  Admit date - 04/13/2021   Admitting Physician Jaydn Fincher Denton Brick, MD  Outpatient Primary MD for the patient is Pomposini, [name], MD  LOS - 3   Chief Complaint  Patient presents with   Shortness of Breath        Subjective:    Tommy Riley today has no fevers, no emesis,  No chest pain,   -Dyspnea on exertion persist hypoxia persist Had BM  Assessment  & Plan :    Principal Problem:   Acute exacerbation of CHF (congestive heart failure) ([place]) Active Problems:   COPD (chronic obstructive pulmonary disease) (HCC)   S/P CABG (coronary artery bypass graft)   Atrial fibrillation (HCC)   OSA on CPAP   Diabetes mellitus type 2 with complications (HCC)   Chronic respiratory failure with hypoxia (HCC)   Elevated brain natriuretic peptide (BNP) level   Elevated troponin I level   Hyperglycemia due to diabetes mellitus (HCC)   Acute respiratory failure with hypoxia (HCC)   Shortness of breath   Fall at home, initial encounter   Ecchymosis  Brief Summary:- 76 y.o. male with medical history significant for type 2 diabetes mellitus, hyperlipidemia, CHF, CAD s/p CABG, COPD, OSA on CPAP, gout admitted on 04/13/2021 with acute hypoxic respiratory failure due to combination of COPD and CHF exacerbation  A/p 1)Acute hypoxic respiratory failure--- due to combination of CHF and COPD exacerbation -Received steroids -BP is too soft to aggressively diurese  -Continue bronchodilators and mucolytics and prednisone  2)DM2-A1c 7.0 reflecting fair diabetic control PTA -Hyperglycemia most likely due to steroids, change Levemir to 12 units twice daily and adjust sliding scale coverage -Use Novolog/Humalog Sliding scale insulin with Accu-Cheks/Fingersticks as ordered   3)H/o CAD--status post prior CABG, no chest pains---  continue metoprolol, -Troponin peaked at 127 troponin pattern not consistent with ACS -Crestor and Zetia as ordered -Cardiology consult appreciated  4) chronic atrial fibrillation---  -EF as below in #5, severe biatrial enlargement noted -Metoprolol increased for better rate control -Dr. Marisue Ivan from cardiology service recommends switching off Coumadin and starting Eliquis -As per cardiology team given his wide QRS/LBBB and low LVEF, and significant dyssynchrony CRT-P/D therapy would be most beneficial long-term. His afib is poorly rate controlled, likely permanent given recurrence after ablation, severe LAE and failed DCCV's - best strategy may be AVN ablation and CRT-P/D with cardiac EP. -Alternatively outpatient cardioversion with likely amiodarone therapy can be considered.    5)HFrEF/Pulm HTN-chronic systolic dysfunction CHF Echo from 04/14/2021 with EF of 35 to 40% -Global hypokinesis of the left ventricle noted -Continue Lasix to 40 mg twice daily, continue metoprolol to 50mg  q 6 hrs , and added Farxiga and Entresto -If BP tolerates cardiologist would like to restart Aldactone -Soft BP limits therapeutic options at this time  6) CKD stage - 3A-  -Renal function -improving with diuresis -renally adjust medications, avoid nephrotoxic agents / dehydration  / hypotension  7) skin and soft tissue bruising/ecchymosis from fall while on Coumadin--- patient said he fell couple weeks prior to admission -X-[facility] of the hip and chest done [facility] on 04/09/2021 without fractures -Please see pictures/photos in epic  8)  obesity/OSA----CPAP nightly advised  9) acute anemia--hgb currently around 12 baseline usually around 16 =-Suspect partly due to recent ecchymosis/significant bruising please see photos in epic -No obvious bleeding at this time  10) generalized weakness and deconditioning--- patient will benefit from home health PT versus outpatient PT  Disposition/Need for in-Hospital  Stay- patient unable to be discharged at this time due to --acute hypoxic respiratory failure secondary to combination of CHF and COPD requiring further interventions and gentle IV diuresis especially given soft BP*  Status is: Inpatient  Remains inpatient appropriate because: Please see disposition above  Disposition: The patient is from: Home              Anticipated d/c is to: Home              Anticipated d/c date is: 2 days              Patient currently is not medically stable to d/c. Barriers: Not Clinically Stable-   Code Status :  -  Code Status: Full Code   Family Communication:    (patient is alert, awake and coherent)  Discussed with wife at bedside Consults  : Cardiology  DVT Prophylaxis  :   - SCDs   SCDs Start: 04/14/21 0059 apixaban (ELIQUIS) tablet 5 mg  Lab Results  Component Value Date   PLT 185 04/16/2021    Inpatient Medications  Scheduled Meds:  apixaban  5 mg Oral BID   aspirin  81 mg Oral Daily   dapagliflozin propanediol  10 mg Oral Daily   dextromethorphan-guaiFENesin  1 tablet Oral BID   digoxin  0.125 mg Oral Daily   ezetimibe  10 mg Oral Daily   fluticasone furoate-vilanterol  1 puff Inhalation Daily   furosemide  40 mg Intravenous Q12H   insulin aspart  0-15 Units Subcutaneous TID WC   insulin aspart  0-5 Units Subcutaneous QHS   insulin aspart  2 Units Subcutaneous TID WC   insulin detemir  8 Units Subcutaneous BID   levalbuterol  0.63 mg Nebulization BID   metoprolol tartrate  50 mg Oral Q6H   omega-3 acid ethyl esters  1 g Oral Daily   potassium chloride  10 mEq Oral BID   predniSONE  40 mg Oral Q breakfast   rosuvastatin  40 mg Oral Daily   sacubitril-valsartan  1 tablet Oral BID   umeclidinium bromide  1 puff Inhalation Daily   Continuous Infusions: PRN Meds:.acetaminophen **OR** acetaminophen, levalbuterol, zolpidem  Anti-infectives (From admission, onward)    None         Objective:   Vitals:   04/17/21 0728 04/17/21  0952 04/17/21 1310 04/17/21 1800  BP:  107/73 (!) 93/59   Pulse:  (!) 115 (!) 58   Resp:  17 18   Temp:  97.9 F (36.6 C) 97.6 F (36.4 C)   TempSrc:  Oral Oral   SpO2: 98% 100% 97%   Weight:    130.9 kg    Wt Readings from Last 3 Encounters:  04/17/21 130.9 kg  12/06/20 125.6 kg  04/04/20 126.6 kg     Intake/Output Summary (Last 24 hours) at 04/17/2021 1840 Last data filed at 04/17/2021 1838 Gross per 24 hour  Intake 1560 ml  Output 3600 ml  Net -2040 ml   Physical Exam  Gen:- Awake Alert, morbidly obese, in no apparent distress  HEENT:- [place].AT, No sclera icterus Nose- [place] 2L/min Neck-Supple Neck, +ve JVD Lungs-air movement is fair, no significant wheezing  CV- S1, S2 normal, irregularly irregular Abd-  +ve B.Sounds, Abd Soft, No tenderness, increased truncal adiposity Extremity/Skin:- +ve  edema with chronic venous stasis, pedal pulses present  Psych-affect is appropriate, oriented x3 Neuro-generalized weakness, no new focal deficits, no tremors   Data Review:   Micro Results Recent Results (from the past 240 hour(s))  Resp Panel by RT-PCR (Flu A&B, Covid) Nasopharyngeal Swab     Status: None   Collection Time: 04/13/21  5:10 PM   Specimen: Nasopharyngeal Swab; Nasopharyngeal(NP) swabs in vial transport medium  Result Value Ref Range Status   SARS Coronavirus 2 by RT PCR NEGATIVE NEGATIVE Final    Comment: (NOTE) SARS-CoV-2 target nucleic acids are NOT DETECTED.  The SARS-CoV-2 RNA is generally detectable in upper respiratory specimens during the acute phase of infection. The lowest concentration of SARS-CoV-2 viral copies this assay can detect is 138 copies/mL. A negative result does not preclude SARS-Cov-2 infection and should not be used as the sole basis for treatment or other patient management decisions. A negative result may occur with  improper specimen collection/handling, submission of specimen other than nasopharyngeal swab, presence of viral  mutation(s) within the areas targeted by this assay, and inadequate number of viral copies(<138 copies/mL). A negative result must be combined with clinical observations, patient history, and epidemiological information. The expected result is Negative.  Fact Sheet for Patients:  EntrepreneurPulse.com.au  Fact Sheet for Healthcare Providers:  IncredibleEmployment.be  This test is no t yet approved or cleared by the [place] FDA and  has been authorized for detection and/or diagnosis of SARS-CoV-2 by FDA under an Emergency Use Authorization (EUA). This EUA will remain  in effect (meaning this test can be used) for the duration of the COVID-19 declaration under Section 564(b)(1) of the Act, 21 U.S.C.section 360bbb-3(b)(1), unless the authorization is terminated  or revoked sooner.       Influenza A by PCR NEGATIVE NEGATIVE Final   Influenza B by PCR NEGATIVE NEGATIVE Final    Comment: (NOTE) The Xpert Xpress SARS-CoV-2/FLU/RSV plus assay is intended as an aid in the diagnosis of influenza from Nasopharyngeal swab specimens and should not be used as a sole basis for treatment. Nasal washings and aspirates are unacceptable for Xpert Xpress SARS-CoV-2/FLU/RSV testing.  Fact Sheet for Patients: EntrepreneurPulse.com.au  Fact Sheet for Healthcare Providers: IncredibleEmployment.be  This test is not yet approved or cleared by the [place] FDA and has been authorized for detection and/or diagnosis of SARS-CoV-2 by FDA under an Emergency Use Authorization (EUA). This EUA will remain in effect (meaning this test can be used) for the duration of the COVID-19 declaration under Section 564(b)(1) of the Act, 21 U.S.C. section 360bbb-3(b)(1), unless the authorization is terminated or revoked.  Performed at [facility], [address] [place], [place] [address]   Culture, blood (routine x 2)     Status:  None (Preliminary result)   Collection Time: 04/13/21  5:40 PM   Specimen: BLOOD RIGHT HAND  Result Value Ref Range Status   Specimen Description BLOOD RIGHT HAND  Final   Special Requests   Final    BOTTLES DRAWN AEROBIC AND ANAEROBIC Blood Culture results may not be optimal due to an excessive volume of blood received in culture bottles   Culture   Final    NO GROWTH 4 DAYS Performed at [facility], [address] [place], [place] [address]    Report Status PENDING  Incomplete  Culture, blood (routine x 2)  Status: None (Preliminary result)   Collection Time: 04/13/21  6:07 PM   Specimen: Left Antecubital; Blood  Result Value Ref Range Status   Specimen Description LEFT ANTECUBITAL  Final   Special Requests   Final    BOTTLES DRAWN AEROBIC AND ANAEROBIC Blood Culture adequate volume   Culture   Final    NO GROWTH 4 DAYS Performed at [facility], [address], [place], [place] [address]    Report Status PENDING  Incomplete    Radiology Reports DG Chest Port 1 View  Result Date: 04/13/2021 CLINICAL DATA:  Shortness of breath for 1 week that is worsening, cough, and fever starting 12 hours ago, history CHF, COPD, former smoker EXAM: PORTABLE CHEST 1 VIEW COMPARISON:  Portable exam 1733 hours compared to 04/09/2021 FINDINGS: Enlargement of cardiac silhouette post median sternotomy. Mediastinal contours and pulmonary vascularity normal. Atherosclerotic calcification aorta. Slight chronic accentuation of interstitial markings, stable. No acute infiltrate, pleural effusion, or pneumothorax. Osseous structures unremarkable. IMPRESSION: Enlargement of cardiac silhouette. No definite acute infiltrate. Electronically Signed   By: Lavonia Dana M.D.   On: 04/13/2021 18:00   ECHOCARDIOGRAM COMPLETE  Result Date: 04/14/2021    ECHOCARDIOGRAM REPORT   Patient Name:   Tommy Riley Date of Exam: 04/14/2021 Medical Rec #:  [id number]        Height:       71.5 in Accession #:    [id number]        Weight:       277.0 lb Date of Birth:  28-Mar-1946       BSA:          2.434 m Patient Age:    32 years         BP:           108//60 mmHg Patient Gender: M                HR:           105 bpm. Exam Location:  [facility] Procedure: 2D Echo, Cardiac Doppler and Color Doppler Indications:     CHF-Acute Diastolic  History:         Patient has no prior history of Echocardiogram examinations.                  CAD, Prior CABG, COPD, Arrythmias:Atrial Fibrillation,                  Signs/Symptoms:Shortness of Breath; Risk Factors:Hypertension                  and Diabetes.  Sonographer:     Wenda Low Referring Phys:  [id number] OLADAPO ADEFESO Diagnosing Phys: Oswaldo Milian MD IMPRESSIONS  1. Left ventricular ejection fraction, by estimation, is 35 to 40%. The left ventricle has moderately decreased function. The left ventricle demonstrates global hypokinesis. There is mild left ventricular hypertrophy. Left ventricular diastolic parameters are indeterminate. In Afib with RVR during study, consider repeat limited echo to better evaluate systolic function once rates better controlled.  2. Right ventricular systolic function is mildly reduced. The right ventricular size is normal. There is severely elevated pulmonary artery systolic pressure. The estimated right ventricular systolic pressure is 93.8 mmHg.  3. Left atrial size was severely dilated.  4. Right atrial size was severely dilated.  5. The mitral valve is normal in structure. Trivial mitral valve regurgitation.  6. The aortic valve is tricuspid. Aortic valve regurgitation is not visualized. No aortic stenosis is present.  7. The inferior vena cava is dilated in size with <50% respiratory variability, suggesting right atrial pressure of 15 mmHg. FINDINGS  Left Ventricle: Left ventricular ejection fraction, by estimation, is 35 to 40%. The left ventricle has moderately decreased function. The left ventricle demonstrates global hypokinesis. Definity  contrast agent was given IV to delineate the left ventricular endocardial borders. The left ventricular internal cavity size was normal in size. There is mild left ventricular hypertrophy. Left ventricular diastolic parameters are indeterminate. Right Ventricle: The right ventricular size is normal. Right vetricular wall thickness was not well visualized. Right ventricular systolic function is mildly reduced. There is severely elevated pulmonary artery systolic pressure. The tricuspid regurgitant velocity is 3.57 m/s, and with an assumed right atrial pressure of 15 mmHg, the estimated right ventricular systolic pressure is 27.0 mmHg. Left Atrium: Left atrial size was severely dilated. Right Atrium: Right atrial size was severely dilated. Pericardium: There is no evidence of pericardial effusion. Mitral Valve: The mitral valve is normal in structure. Trivial mitral valve regurgitation. MV peak gradient, 8.2 mmHg. The mean mitral valve gradient is 4.0 mmHg. Tricuspid Valve: The tricuspid valve is normal in structure. Tricuspid valve regurgitation is trivial. Aortic Valve: The aortic valve is tricuspid. Aortic valve regurgitation is not visualized. No aortic stenosis is present. Aortic valve mean gradient measures 3.7 mmHg. Aortic valve peak gradient measures 7.9 mmHg. Aortic valve area, by VTI measures 2.55 cm. Pulmonic Valve: The pulmonic valve was not well visualized. Pulmonic valve regurgitation is not visualized. Aorta: The aortic root is normal in size and structure. Venous: The inferior vena cava is dilated in size with less than 50% respiratory variability, suggesting right atrial pressure of 15 mmHg. IAS/Shunts: The interatrial septum was not well visualized.  LEFT VENTRICLE PLAX 2D LVIDd:         5.40 cm   Diastology LVIDs:         4.55 cm   LV e' medial:    9.00 cm/s LV PW:         1.30 cm   LV E/e' medial:  14.7 LV IVS:        1.30 cm   LV e' lateral:   13.00 cm/s LVOT diam:     2.00 cm   LV E/e' lateral:  10.2 LV SV:         52 LV SV Index:   21 LVOT Area:     3.14 cm  RIGHT VENTRICLE RV Basal diam:  3.50 cm RV Mid diam:    2.50 cm LEFT ATRIUM              Index        RIGHT ATRIUM           Index LA diam:        6.30 cm  2.59 cm/m   RA Area:     33.50 cm LA Vol (A2C):   162.0 ml 66.55 ml/m  RA Volume:   120.00 ml 49.30 ml/m LA Vol (A4C):   170.0 ml 69.84 ml/m LA Biplane Vol: 167.0 ml 68.60 ml/m  AORTIC VALVE                    PULMONIC VALVE AV Area (Vmax):    2.30 cm     PV Vmax:       1.03 m/s AV Area (Vmean):   2.39 cm     PV Peak grad:  4.2 mmHg AV Area (VTI):     2.55  cm AV Vmax:           140.67 cm/s AV Vmean:          85.267 cm/s AV VTI:            0.202 m AV Peak Grad:      7.9 mmHg AV Mean Grad:      3.7 mmHg LVOT Vmax:         103.00 cm/s LVOT Vmean:        64.950 cm/s LVOT VTI:          0.164 m LVOT/AV VTI ratio: 0.81  AORTA Ao Root diam: 3.30 cm MITRAL VALVE                TRICUSPID VALVE MV Area (PHT): 4.15 cm     TR Peak grad:   51.0 mmHg MV Area VTI:   1.91 cm     TR Vmax:        357.00 cm/s MV Peak grad:  8.2 mmHg MV Mean grad:  4.0 mmHg     SHUNTS MV Vmax:       1.43 m/s     Systemic VTI:  0.16 m MV Vmean:      87.7 cm/s    Systemic Diam: 2.00 cm MV Decel Time: 183 msec MV E velocity: 132.00 cm/s Oswaldo Milian MD Electronically signed by Oswaldo Milian MD Signature Date/Time: 04/14/2021/12:02:52 PM    Final (Updated)      CBC Recent Labs  Lab 04/13/21 1724 04/14/21 0520 04/16/21 0637  WBC 6.8 7.0 9.8  HGB 12.4* 12.6* 13.7  HCT 40.2 40.7 46.2  PLT 153 161 185  MCV 96.4 96.0 98.7  MCH 29.7 29.7 29.3  MCHC 30.8 31.0 29.7*  RDW 17.2* 17.1* 17.2*  LYMPHSABS 0.6*  --   --   MONOABS 0.6  --   --   EOSABS 0.1  --   --   BASOSABS 0.0  --   --     Chemistries  Recent Labs  Lab 04/13/21 1724 04/14/21 0520 04/16/21 0637 04/17/21 0646  NA 138 137 138 138  K 4.8 4.8 4.1 4.0  CL 102 101 104 101  CO2 27 25 25 29   GLUCOSE 128* 234* 210* 184*  BUN 42* 43* 47*  53*  CREATININE 1.64* 1.62* 1.25* 1.27*  CALCIUM 8.9 9.0 9.1 8.7*  MG  --  2.3 2.4 2.3  AST  --  34  --   --   ALT  --  44  --   --   ALKPHOS  --  87  --   --   BILITOT  --  1.3*  --   --    ------------------------------------------------------------------------------------------------------------------ No results for input(s): CHOL, HDL, LDLCALC, TRIG, CHOLHDL, LDLDIRECT in the last 72 hours.  Lab Results  Component Value Date   HGBA1C 7.0 (H) 04/13/2021   ------------------------------------------------------------------------------------------------------------------ No results for input(s): TSH, T4TOTAL, T3FREE, THYROIDAB in the last 72 hours.  Invalid input(s): FREET3  ------------------------------------------------------------------------------------------------------------------ No results for input(s): VITAMINB12, FOLATE, FERRITIN, TIBC, IRON, RETICCTPCT in the last 72 hours.  Coagulation profile Recent Labs  Lab 04/13/21 1724 04/14/21 0520 04/16/21 0637  INR 1.8* 1.7* 1.4*    No results for input(s): DDIMER in the last 72 hours.  Cardiac Enzymes No results for input(s): CKMB, TROPONINI, MYOGLOBIN in the last 168 hours.  Invalid input(s): CK ------------------------------------------------------------------------------------------------------------------    Component Value Date/Time   BNP 382.0 (H) 04/17/2021 1497   Roxan Hockey M.D on 04/17/2021  at 6:40 PM  Go to [email or web address] - for contact info  [facility] - Office  [phone]

## 2021-04-17 NOTE — Progress Notes (Signed)
Notified by telemetry that patient was running a 14 beat v-tach. Checked on patient and he was sitting on the side of the bed. Notified Joslyn Hy, MD.

## 2021-04-17 NOTE — Progress Notes (Signed)
Inpatient Diabetes Program Recommendations  AACE/ADA: New Consensus Statement on Inpatient Glycemic Control (2015)  Target Ranges:  Prepandial:   less than 140 mg/dL      Peak postprandial:   less than 180 mg/dL (1-2 hours)      Critically ill patients:  140 - 180 mg/dL   Lab Results  Component Value Date   GLUCAP 188 (H) 04/17/2021   HGBA1C 7.0 (H) 04/13/2021    Review of Glycemic Control Results for Tommy Riley, Tommy Riley (MRN [id number]) as of 04/17/2021 10:56  Ref. Range 04/16/2021 08:15 04/16/2021 11:12 04/16/2021 17:29 04/16/2021 21:27 04/17/2021 07:13  Glucose-Capillary Latest Ref Range: 70 - 99 mg/dL 183 (H) 217 (H) 185 (H) 266 (H) 188 (H)   Diabetes history: DM 2 Outpatient Diabetes medications: Farxiga 10 mg daily, Humalog 20 units q PM, Levemir 30 units daily Current orders for Inpatient glycemic control:  Novolog moderate tid with meals and HS Novolog 2 units tid with meals Farxiga 10 mg daily Prednisone 40 mg daily Levemir 8 units bid  Inpatient Diabetes Program Recommendations:    Consider increasing Levemir to 12 units bid.  Also please increase Novolog meal coverage to 4 units tid with meals (hold if patient eats less than 50% or NPO).   Thanks,  Adah Perl, RN, BC-ADM Inpatient Diabetes Coordinator Pager [phone]  (8a-5p)

## 2021-04-18 ENCOUNTER — Telehealth: Payer: Self-pay | Admitting: *Deleted

## 2021-04-18 DIAGNOSIS — I4819 Other persistent atrial fibrillation: Secondary | ICD-10-CM

## 2021-04-18 LAB — BASIC METABOLIC PANEL
Anion gap: 8 (ref 5–15)
BUN: 51 mg/dL — ABNORMAL HIGH (ref 8–23)
CO2: 27 mmol/L (ref 22–32)
Calcium: 8.7 mg/dL — ABNORMAL LOW (ref 8.9–10.3)
Chloride: 103 mmol/L (ref 98–111)
Creatinine, Ser: 1.26 mg/dL — ABNORMAL HIGH (ref 0.61–1.24)
GFR, Estimated: >60 mL/min (ref >60)
Glucose, Bld: 174 mg/dL — ABNORMAL HIGH (ref 70–99)
Potassium: 3.8 mmol/L (ref 3.5–5.1)
Sodium: 138 mmol/L (ref 135–145)

## 2021-04-18 LAB — CULTURE, BLOOD (ROUTINE X 2)
Culture: NO GROWTH
Culture: NO GROWTH
Special Requests: ADEQUATE

## 2021-04-18 LAB — GLUCOSE, CAPILLARY
Glucose-Capillary: 142 mg/dL — ABNORMAL HIGH (ref 70–99)
Glucose-Capillary: 166 mg/dL — ABNORMAL HIGH (ref 70–99)
Glucose-Capillary: 169 mg/dL — ABNORMAL HIGH (ref 70–99)
Glucose-Capillary: 253 mg/dL — ABNORMAL HIGH (ref 70–99)

## 2021-04-18 LAB — CBC
HCT: 39.8 % (ref 39.0–52.0)
Hemoglobin: 12.4 g/dL — ABNORMAL LOW (ref 13.0–17.0)
MCH: 30.5 pg (ref 26.0–34.0)
MCHC: 31.2 g/dL (ref 30.0–36.0)
MCV: 98 fL (ref 80.0–100.0)
Platelets: 181 10*3/uL (ref 150–400)
RBC: 4.06 MIL/uL — ABNORMAL LOW (ref 4.22–5.81)
RDW: 16.8 % — ABNORMAL HIGH (ref 11.5–15.5)
WBC: 9 10*3/uL (ref 4.0–10.5)
nRBC: 0 % (ref 0.0–0.2)

## 2021-04-18 LAB — MAGNESIUM: Magnesium: 2.2 mg/dL (ref 1.7–2.4)

## 2021-04-18 MED ORDER — PREDNISONE 20 MG PO TABS
20.0000 mg | ORAL_TABLET | Freq: Every day | ORAL | Status: DC
  Administered 2021-04-19: 20 mg via ORAL
  Filled 2021-04-18: qty 1

## 2021-04-18 MED ORDER — METOPROLOL SUCCINATE ER 50 MG PO TB24
200.0000 mg | ORAL_TABLET | Freq: Every day | ORAL | Status: DC
  Administered 2021-04-18 – 2021-04-19 (×2): 200 mg via ORAL
  Filled 2021-04-18 (×2): qty 4

## 2021-04-18 MED ORDER — SPIRONOLACTONE 12.5 MG HALF TABLET
12.5000 mg | ORAL_TABLET | Freq: Every day | ORAL | Status: DC
  Administered 2021-04-18 – 2021-04-19 (×2): 12.5 mg via ORAL
  Filled 2021-04-18 (×4): qty 1

## 2021-04-18 NOTE — Telephone Encounter (Signed)
Referral to EP for ablation

## 2021-04-18 NOTE — Progress Notes (Signed)
Patient Demographics:    Tommy Riley, is a 75 y.o. male, DOB - 1945-12-06, VXB:[id number]  Admit date - 04/13/2021   Admitting Physician Tacarra Justo Denton Brick, MD  Outpatient Primary MD for the patient is Pomposini, [name], MD  LOS - 4   Chief Complaint  Patient presents with   Shortness of Breath        Subjective:    Tommy Riley today has no fevers, no emesis,  No chest pain,   -Voiding well, continues to have dyspnea on exertion, -Hypoxia worsens with activity  Assessment  & Plan :    Principal Problem:   Acute exacerbation of CHF (congestive heart failure) ([place]) Active Problems:   COPD (chronic obstructive pulmonary disease) (HCC)   S/P CABG (coronary artery bypass graft)   Atrial fibrillation (HCC)   OSA on CPAP   Diabetes mellitus type 2 with complications (HCC)   Chronic respiratory failure with hypoxia (HCC)   Elevated brain natriuretic peptide (BNP) level   Elevated troponin I level   Hyperglycemia due to diabetes mellitus (HCC)   Acute respiratory failure with hypoxia (HCC)   Shortness of breath   Fall at home, initial encounter   Ecchymosis  Brief Summary:- 75 y.o. male with medical history significant for type 2 diabetes mellitus, hyperlipidemia, CHF, CAD s/p CABG, COPD, OSA on CPAP, gout admitted on 04/13/2021 with acute hypoxic respiratory failure due to combination of COPD and CHF exacerbation  A/p 1)Acute hypoxic respiratory failure--- due to combination of systolic CHF and COPD exacerbation -Received steroids continues to have dyspnea on exertion, -Hypoxia worsens with activity -Continue bronchodilators and mucolytics and decrease prednisone to 20 mg in am -Continue IV Lasix/diuresis -Continue supplemental oxygen -  2)DM2-A1c 7.0 reflecting fair diabetic control PTA -Hyperglycemia most likely due to steroids, - change Levemir to 12 units twice daily and adjust  sliding scale coverage -Use Novolog/Humalog Sliding scale insulin with Accu-Cheks/Fingersticks as ordered   3)H/o CAD--status post prior CABG, no chest pains--- continue aspirin, increase Toprol-XL 200 mg daily ,  - -Troponin peaked at 127 troponin pattern not consistent with ACS -Crestor and Zetia as ordered -Cardiology consult appreciated  4) chronic atrial fibrillation---  -EF as below in #5, severe biatrial enlargement noted -Toprol-XL increased to 200 mg daily increased for better rate control -Dr. Marisue Ivan from cardiology service recommends switching off Coumadin and starting Eliquis -As per cardiology team given his wide QRS/LBBB and low LVEF, and significant dyssynchrony CRT-P/D therapy would be most beneficial long-term. His afib is poorly rate controlled, likely permanent given recurrence after ablation, severe LAE and failed DCCV's - best strategy may be AVN ablation and CRT-P/D with cardiac EP. -Alternatively outpatient cardioversion with likely amiodarone therapy can be considered.   -Outpatient referral to EP for consideration of ablation requested  5)HFrEF/Pulm HTN-chronic systolic dysfunction CHF Echo from 04/14/2021 with EF of 35 to 40% -Global hypokinesis of the left ventricle noted -Continue Lasix to 40 mg twice daily, -Continue digoxin -Toprol-XL increased to 20 mg daily on 04/18/2021, added Farxiga and Entresto -Aldactone 12.5 mg daily added on 04/19/2019 -Soft BP limits therapeutic options at this time  6) CKD stage - 3A-  -Renal function -improving with diuresis -renally adjust medications, avoid nephrotoxic agents / dehydration  / hypotension  7) skin and soft tissue bruising/ecchymosis from fall while on Coumadin--- patient said he fell couple weeks prior to admission -X-[facility] of the hip and chest done at [facility] on 04/09/2021 without fractures -Please see pictures/photos in epic  8) obesity/OSA----CPAP nightly advised  9) acute anemia--hgb currently  around 12 baseline usually around 16 =-Suspect partly due to recent ecchymosis/significant bruising please see photos in epic -No obvious bleeding at this time  10) generalized weakness and deconditioning--- patient will benefit from home health PT   Disposition/Need for in-Hospital Stay- patient unable to be discharged at this time due to --acute hypoxic respiratory failure secondary to combination of CHF and COPD requiring further interventions and gentle IV diuresis especially given soft BP* -Anticipate discharge home in 1 to 2 days with home health physical therapy  Status is: Inpatient  Remains inpatient appropriate because: Please see disposition above  Disposition: The patient is from: Home              Anticipated d/c is to: Home with [facility] PT              Anticipated d/c date is: 1 day 1 to 2 days               Patient currently is not medically stable to d/c. Barriers: Not Clinically Stable-   Code Status :  -  Code Status: Full Code   Family Communication:    (patient is alert, awake and coherent)  Discussed with wife at bedside Consults  : Cardiology  DVT Prophylaxis  :   - SCDs   SCDs Start: 04/14/21 0059 apixaban (ELIQUIS) tablet 5 mg  Lab Results  Component Value Date   PLT 181 04/18/2021    Inpatient Medications  Scheduled Meds:  apixaban  5 mg Oral BID   aspirin  81 mg Oral Daily   dapagliflozin propanediol  10 mg Oral Daily   dextromethorphan-guaiFENesin  1 tablet Oral BID   digoxin  0.125 mg Oral Daily   ezetimibe  10 mg Oral Daily   fluticasone furoate-vilanterol  1 puff Inhalation Daily   furosemide  40 mg Intravenous Q12H   insulin aspart  0-15 Units Subcutaneous TID WC   insulin aspart  0-5 Units Subcutaneous QHS   insulin aspart  2 Units Subcutaneous TID WC   insulin detemir  12 Units Subcutaneous BID   levalbuterol  0.63 mg Nebulization BID   metoprolol succinate  200 mg Oral Daily   omega-3 acid ethyl esters  1 g Oral Daily   potassium chloride   10 mEq Oral BID   predniSONE  40 mg Oral Q breakfast   rosuvastatin  40 mg Oral Daily   sacubitril-valsartan  1 tablet Oral BID   spironolactone  12.5 mg Oral Daily   umeclidinium bromide  1 puff Inhalation Daily   Continuous Infusions: PRN Meds:.acetaminophen **OR** acetaminophen, levalbuterol, zolpidem  Anti-infectives (From admission, onward)    None         Objective:   Vitals:   04/18/21 0253 04/18/21 0454 04/18/21 0751 04/18/21 1100  BP: (!) 126/98 123/79    Pulse: (!) 104 (!) 109    Resp:  20    Temp:  (!) 97.5 F (36.4 C)    TempSrc:  Axillary    SpO2:  100% 99%   Weight:  131 kg  129.5 kg    Wt Readings from Last 3 Encounters:  04/18/21 129.5 kg  12/06/20 125.6 kg  04/04/20 126.6 kg  Intake/Output Summary (Last 24 hours) at 04/18/2021 1823 Last data filed at 04/18/2021 0900 Gross per 24 hour  Intake 1440 ml  Output 2950 ml  Net -1510 ml   Physical Exam  Gen:- Awake Alert, morbidly obese, in no apparent distress  HEENT:- [place].AT, No sclera icterus Nose- [place] 2L/min Neck-Supple Neck, +ve JVD Lungs-air movement is fair, no significant wheezing  CV- S1, S2 normal, irregularly irregular Abd-  +ve B.Sounds, Abd Soft, No tenderness, increased truncal adiposity Extremity/Skin:- +ve  edema with chronic venous stasis, pedal pulses present  Psych-affect is appropriate, oriented x3 Neuro-generalized weakness, no new focal deficits, no tremors   Data Review:   Micro Results Recent Results (from the past 240 hour(s))  Resp Panel by RT-PCR (Flu A&B, Covid) Nasopharyngeal Swab     Status: None   Collection Time: 04/13/21  5:10 PM   Specimen: Nasopharyngeal Swab; Nasopharyngeal(NP) swabs in vial transport medium  Result Value Ref Range Status   SARS Coronavirus 2 by RT PCR NEGATIVE NEGATIVE Final    Comment: (NOTE) SARS-CoV-2 target nucleic acids are NOT DETECTED.  The SARS-CoV-2 RNA is generally detectable in upper respiratory specimens during the acute  phase of infection. The lowest concentration of SARS-CoV-2 viral copies this assay can detect is 138 copies/mL. A negative result does not preclude SARS-Cov-2 infection and should not be used as the sole basis for treatment or other patient management decisions. A negative result may occur with  improper specimen collection/handling, submission of specimen other than nasopharyngeal swab, presence of viral mutation(s) within the areas targeted by this assay, and inadequate number of viral copies(<138 copies/mL). A negative result must be combined with clinical observations, patient history, and epidemiological information. The expected result is Negative.  Fact Sheet for Patients:  EntrepreneurPulse.com.au  Fact Sheet for Healthcare Providers:  IncredibleEmployment.be  This test is no t yet approved or cleared by the [place] FDA and  has been authorized for detection and/or diagnosis of SARS-CoV-2 by FDA under an Emergency Use Authorization (EUA). This EUA will remain  in effect (meaning this test can be used) for the duration of the COVID-19 declaration under Section 564(b)(1) of the Act, 21 U.S.C.section 360bbb-3(b)(1), unless the authorization is terminated  or revoked sooner.       Influenza A by PCR NEGATIVE NEGATIVE Final   Influenza B by PCR NEGATIVE NEGATIVE Final    Comment: (NOTE) The Xpert Xpress SARS-CoV-2/FLU/RSV plus assay is intended as an aid in the diagnosis of influenza from Nasopharyngeal swab specimens and should not be used as a sole basis for treatment. Nasal washings and aspirates are unacceptable for Xpert Xpress SARS-CoV-2/FLU/RSV testing.  Fact Sheet for Patients: EntrepreneurPulse.com.au  Fact Sheet for Healthcare Providers: IncredibleEmployment.be  This test is not yet approved or cleared by the [place] FDA and has been authorized for detection and/or diagnosis of  SARS-CoV-2 by FDA under an Emergency Use Authorization (EUA). This EUA will remain in effect (meaning this test can be used) for the duration of the COVID-19 declaration under Section 564(b)(1) of the Act, 21 U.S.C. section 360bbb-3(b)(1), unless the authorization is terminated or revoked.  Performed at [facility], [address] [place], [place] [address]   Culture, blood (routine x 2)     Status: None   Collection Time: 04/13/21  5:40 PM   Specimen: BLOOD RIGHT HAND  Result Value Ref Range Status   Specimen Description BLOOD RIGHT HAND  Final   Special Requests   Final    BOTTLES DRAWN  AEROBIC AND ANAEROBIC Blood Culture results may not be optimal due to an excessive volume of blood received in culture bottles   Culture   Final    NO GROWTH 5 DAYS Performed at [facility], [address], [place], [place] [address]    Report Status 04/18/2021 FINAL  Final  Culture, blood (routine x 2)     Status: None   Collection Time: 04/13/21  6:07 PM   Specimen: Left Antecubital; Blood  Result Value Ref Range Status   Specimen Description LEFT ANTECUBITAL  Final   Special Requests   Final    BOTTLES DRAWN AEROBIC AND ANAEROBIC Blood Culture adequate volume   Culture   Final    NO GROWTH 5 DAYS Performed at [facility], [address] [place], [place] [address]    Report Status 04/18/2021 FINAL  Final    Radiology Reports DG Chest Port 1 View  Result Date: 04/13/2021 CLINICAL DATA:  Shortness of breath for 1 week that is worsening, cough, and fever starting 12 hours ago, history CHF, COPD, former smoker EXAM: PORTABLE CHEST 1 VIEW COMPARISON:  Portable exam 1733 hours compared to 04/09/2021 FINDINGS: Enlargement of cardiac silhouette post median sternotomy. Mediastinal contours and pulmonary vascularity normal. Atherosclerotic calcification aorta. Slight chronic accentuation of interstitial markings, stable. No acute infiltrate, pleural effusion, or pneumothorax. Osseous structures  unremarkable. IMPRESSION: Enlargement of cardiac silhouette. No definite acute infiltrate. Electronically Signed   By: Lavonia Dana M.D.   On: 04/13/2021 18:00   ECHOCARDIOGRAM COMPLETE  Result Date: 04/14/2021    ECHOCARDIOGRAM REPORT   Patient Name:   REICE BIENVENUE Date of Exam: 04/14/2021 Medical Rec #:  [id number]        Height:       71.5 in Accession #:    [id number]       Weight:       277.0 lb Date of Birth:  06-26-1946       BSA:          2.434 m Patient Age:    4 years         BP:           108//60 mmHg Patient Gender: M                HR:           105 bpm. Exam Location:  [facility] Procedure: 2D Echo, Cardiac Doppler and Color Doppler Indications:     CHF-Acute Diastolic  History:         Patient has no prior history of Echocardiogram examinations.                  CAD, Prior CABG, COPD, Arrythmias:Atrial Fibrillation,                  Signs/Symptoms:Shortness of Breath; Risk Factors:Hypertension                  and Diabetes.  Sonographer:     Wenda Low Referring Phys:  [id number] OLADAPO ADEFESO Diagnosing Phys: Oswaldo Milian MD IMPRESSIONS  1. Left ventricular ejection fraction, by estimation, is 35 to 40%. The left ventricle has moderately decreased function. The left ventricle demonstrates global hypokinesis. There is mild left ventricular hypertrophy. Left ventricular diastolic parameters are indeterminate. In Afib with RVR during study, consider repeat limited echo to better evaluate systolic function once rates better controlled.  2. Right ventricular systolic function is mildly reduced. The right ventricular size is normal. There  is severely elevated pulmonary artery systolic pressure. The estimated right ventricular systolic pressure is 97.3 mmHg.  3. Left atrial size was severely dilated.  4. Right atrial size was severely dilated.  5. The mitral valve is normal in structure. Trivial mitral valve regurgitation.  6. The aortic valve is tricuspid. Aortic valve regurgitation  is not visualized. No aortic stenosis is present.  7. The inferior vena cava is dilated in size with <50% respiratory variability, suggesting right atrial pressure of 15 mmHg. FINDINGS  Left Ventricle: Left ventricular ejection fraction, by estimation, is 35 to 40%. The left ventricle has moderately decreased function. The left ventricle demonstrates global hypokinesis. Definity contrast agent was given IV to delineate the left ventricular endocardial borders. The left ventricular internal cavity size was normal in size. There is mild left ventricular hypertrophy. Left ventricular diastolic parameters are indeterminate. Right Ventricle: The right ventricular size is normal. Right vetricular wall thickness was not well visualized. Right ventricular systolic function is mildly reduced. There is severely elevated pulmonary artery systolic pressure. The tricuspid regurgitant velocity is 3.57 m/s, and with an assumed right atrial pressure of 15 mmHg, the estimated right ventricular systolic pressure is 53.2 mmHg. Left Atrium: Left atrial size was severely dilated. Right Atrium: Right atrial size was severely dilated. Pericardium: There is no evidence of pericardial effusion. Mitral Valve: The mitral valve is normal in structure. Trivial mitral valve regurgitation. MV peak gradient, 8.2 mmHg. The mean mitral valve gradient is 4.0 mmHg. Tricuspid Valve: The tricuspid valve is normal in structure. Tricuspid valve regurgitation is trivial. Aortic Valve: The aortic valve is tricuspid. Aortic valve regurgitation is not visualized. No aortic stenosis is present. Aortic valve mean gradient measures 3.7 mmHg. Aortic valve peak gradient measures 7.9 mmHg. Aortic valve area, by VTI measures 2.55 cm. Pulmonic Valve: The pulmonic valve was not well visualized. Pulmonic valve regurgitation is not visualized. Aorta: The aortic root is normal in size and structure. Venous: The inferior vena cava is dilated in size with less than 50%  respiratory variability, suggesting right atrial pressure of 15 mmHg. IAS/Shunts: The interatrial septum was not well visualized.  LEFT VENTRICLE PLAX 2D LVIDd:         5.40 cm   Diastology LVIDs:         4.55 cm   LV e' medial:    9.00 cm/s LV PW:         1.30 cm   LV E/e' medial:  14.7 LV IVS:        1.30 cm   LV e' lateral:   13.00 cm/s LVOT diam:     2.00 cm   LV E/e' lateral: 10.2 LV SV:         52 LV SV Index:   21 LVOT Area:     3.14 cm  RIGHT VENTRICLE RV Basal diam:  3.50 cm RV Mid diam:    2.50 cm LEFT ATRIUM              Index        RIGHT ATRIUM           Index LA diam:        6.30 cm  2.59 cm/m   RA Area:     33.50 cm LA Vol (A2C):   162.0 ml 66.55 ml/m  RA Volume:   120.00 ml 49.30 ml/m LA Vol (A4C):   170.0 ml 69.84 ml/m LA Biplane Vol: 167.0 ml 68.60 ml/m  AORTIC VALVE  PULMONIC VALVE AV Area (Vmax):    2.30 cm     PV Vmax:       1.03 m/s AV Area (Vmean):   2.39 cm     PV Peak grad:  4.2 mmHg AV Area (VTI):     2.55 cm AV Vmax:           140.67 cm/s AV Vmean:          85.267 cm/s AV VTI:            0.202 m AV Peak Grad:      7.9 mmHg AV Mean Grad:      3.7 mmHg LVOT Vmax:         103.00 cm/s LVOT Vmean:        64.950 cm/s LVOT VTI:          0.164 m LVOT/AV VTI ratio: 0.81  AORTA Ao Root diam: 3.30 cm MITRAL VALVE                TRICUSPID VALVE MV Area (PHT): 4.15 cm     TR Peak grad:   51.0 mmHg MV Area VTI:   1.91 cm     TR Vmax:        357.00 cm/s MV Peak grad:  8.2 mmHg MV Mean grad:  4.0 mmHg     SHUNTS MV Vmax:       1.43 m/s     Systemic VTI:  0.16 m MV Vmean:      87.7 cm/s    Systemic Diam: 2.00 cm MV Decel Time: 183 msec MV E velocity: 132.00 cm/s Oswaldo Milian MD Electronically signed by Oswaldo Milian MD Signature Date/Time: 04/14/2021/12:02:52 PM    Final (Updated)      CBC Recent Labs  Lab 04/13/21 1724 04/14/21 0520 04/16/21 0637 04/18/21 0108  WBC 6.8 7.0 9.8 9.0  HGB 12.4* 12.6* 13.7 12.4*  HCT 40.2 40.7 46.2 39.8  PLT 153 161 185  181  MCV 96.4 96.0 98.7 98.0  MCH 29.7 29.7 29.3 30.5  MCHC 30.8 31.0 29.7* 31.2  RDW 17.2* 17.1* 17.2* 16.8*  LYMPHSABS 0.6*  --   --   --   MONOABS 0.6  --   --   --   EOSABS 0.1  --   --   --   BASOSABS 0.0  --   --   --     Chemistries  Recent Labs  Lab 04/13/21 1724 04/14/21 0520 04/16/21 0637 04/17/21 0646 04/18/21 0108  NA 138 137 138 138 138  K 4.8 4.8 4.1 4.0 3.8  CL 102 101 104 101 103  CO2 27 25 25 29 27   GLUCOSE 128* 234* 210* 184* 174*  BUN 42* 43* 47* 53* 51*  CREATININE 1.64* 1.62* 1.25* 1.27* 1.26*  CALCIUM 8.9 9.0 9.1 8.7* 8.7*  MG  --  2.3 2.4 2.3 2.2  AST  --  34  --   --   --   ALT  --  44  --   --   --   ALKPHOS  --  87  --   --   --   BILITOT  --  1.3*  --   --   --    ------------------------------------------------------------------------------------------------------------------ No results for input(s): CHOL, HDL, LDLCALC, TRIG, CHOLHDL, LDLDIRECT in the last 72 hours.  Lab Results  Component Value Date   HGBA1C 7.0 (H) 04/13/2021   ------------------------------------------------------------------------------------------------------------------ No results for input(s): TSH, T4TOTAL, T3FREE, THYROIDAB in the last  72 hours.  Invalid input(s): FREET3  ------------------------------------------------------------------------------------------------------------------ No results for input(s): VITAMINB12, FOLATE, FERRITIN, TIBC, IRON, RETICCTPCT in the last 72 hours.  Coagulation profile Recent Labs  Lab 04/13/21 1724 04/14/21 0520 04/16/21 0637  INR 1.8* 1.7* 1.4*    No results for input(s): DDIMER in the last 72 hours.  Cardiac Enzymes No results for input(s): CKMB, TROPONINI, MYOGLOBIN in the last 168 hours.  Invalid input(s): CK ------------------------------------------------------------------------------------------------------------------    Component Value Date/Time   BNP 382.0 (H) 04/17/2021 4742   Roxan Hockey M.D on  04/18/2021 at 6:23 PM  Go to [email or web address] - for contact info  [facility] - Office  [phone]

## 2021-04-18 NOTE — Progress Notes (Signed)
Cardiology Progress Note  Patient ID: Tommy Riley MRN: [id number] DOB: 13-Dec-1945 Date of Encounter: 04/18/2021  Primary Cardiologist: Tommy Lesches, MD  Subjective   Chief Complaint: SOB  HPI: Good diuresis.  Rates much improved.  Still short of breath but improving.  Not back to baseline per his report.  ROS:  All other ROS reviewed and negative. Pertinent positives noted in the HPI.     Inpatient Medications  Scheduled Meds:  apixaban  5 mg Oral BID   aspirin  81 mg Oral Daily   dapagliflozin propanediol  10 mg Oral Daily   dextromethorphan-guaiFENesin  1 tablet Oral BID   digoxin  0.125 mg Oral Daily   ezetimibe  10 mg Oral Daily   fluticasone furoate-vilanterol  1 puff Inhalation Daily   furosemide  40 mg Intravenous Q12H   insulin aspart  0-15 Units Subcutaneous TID WC   insulin aspart  0-5 Units Subcutaneous QHS   insulin aspart  2 Units Subcutaneous TID WC   insulin detemir  12 Units Subcutaneous BID   levalbuterol  0.63 mg Nebulization BID   metoprolol succinate  200 mg Oral Daily   omega-3 acid ethyl esters  1 g Oral Daily   potassium chloride  10 mEq Oral BID   predniSONE  40 mg Oral Q breakfast   rosuvastatin  40 mg Oral Daily   sacubitril-valsartan  1 tablet Oral BID   spironolactone  12.5 mg Oral Daily   umeclidinium bromide  1 puff Inhalation Daily   Continuous Infusions:  PRN Meds: acetaminophen **OR** acetaminophen, levalbuterol, zolpidem   Vital Signs   Vitals:   04/17/21 2003 04/18/21 0253 04/18/21 0454 04/18/21 0751  BP: 133/77 (!) 126/98 123/79   Pulse: (!) 51 (!) 104 (!) 109   Resp: 18  20   Temp: 98 F (36.7 C)  (!) 97.5 F (36.4 C)   TempSrc:   Axillary   SpO2: 98%  100% 99%  Weight:   131 kg     Intake/Output Summary (Last 24 hours) at 04/18/2021 0903 Last data filed at 04/18/2021 0500 Gross per 24 hour  Intake 1560 ml  Output 3150 ml  Net -1590 ml   Last 3 Weights 04/18/2021 04/17/2021 04/17/2021  Weight (lbs) 288  lb 12.8 oz 288 lb 9.6 oz 291 lb 0.1 oz  Weight (kg) 131 kg 130.908 kg 132 kg      Telemetry  Overnight telemetry shows A. fib heart rates in the low 100s, which I personally reviewed.   Physical Exam   Vitals:   04/17/21 2003 04/18/21 0253 04/18/21 0454 04/18/21 0751  BP: 133/77 (!) 126/98 123/79   Pulse: (!) 51 (!) 104 (!) 109   Resp: 18  20   Temp: 98 F (36.7 C)  (!) 97.5 F (36.4 C)   TempSrc:   Axillary   SpO2: 98%  100% 99%  Weight:   131 kg     Intake/Output Summary (Last 24 hours) at 04/18/2021 0903 Last data filed at 04/18/2021 0500 Gross per 24 hour  Intake 1560 ml  Output 3150 ml  Net -1590 ml    Last 3 Weights 04/18/2021 04/17/2021 04/17/2021  Weight (lbs) 288 lb 12.8 oz 288 lb 9.6 oz 291 lb 0.1 oz  Weight (kg) 131 kg 130.908 kg 132 kg    Body mass index is 39.72 kg/m.   General: Well nourished, well developed, in no acute distress Head: Atraumatic, normal size  Eyes: PEERLA, EOMI  Neck: Supple, JVD 7  to 8 cm of water Endocrine: No thryomegaly Cardiac: Normal S1, S2; irregular rhythm, no murmurs rubs or gallops Lungs: Crackles at the lung bases Abd: Soft, nontender, no hepatomegaly  Ext: Trace edema Musculoskeletal: No deformities, BUE and BLE strength normal and equal Skin: Warm and dry, no rashes   Neuro: Alert and oriented to person, place, time, and situation, CNII-XII grossly intact, no focal deficits  Psych: Normal mood and affect   Labs  High Sensitivity Troponin:   Recent Labs  Lab 04/13/21 1724 04/13/21 1933  TROPONINIHS 127* 118*     Cardiac EnzymesNo results for input(s): TROPONINI in the last 168 hours. No results for input(s): TROPIPOC in the last 168 hours.  Chemistry Recent Labs  Lab 04/14/21 0520 04/16/21 0637 04/17/21 0646 04/18/21 0108  NA 137 138 138 138  K 4.8 4.1 4.0 3.8  CL 101 104 101 103  CO2 _0 GLUCOSE 234* 210* 184* 174*  BUN 43* 47* 53* 51*  CREATININE 1.62* 1.25* 1.27* 1.26*  CALCIUM 9.0 9.1 8.7*  8.7*  PROT 6.9  --   --   --   ALBUMIN 3.6 3.7  --   --   AST 34  --   --   --   ALT 44  --   --   --   ALKPHOS 87  --   --   --   BILITOT 1.3*  --   --   --   GFRNONAA 45* >60 >60 >60  ANIONGAP _1 Hematology Recent Labs  Lab 04/14/21 0520 04/16/21 0637 04/18/21 0108  WBC 7.0 9.8 9.0  RBC 4.24 4.68 4.06*  HGB 12.6* 13.7 12.4*  HCT 40.7 46.2 39.8  MCV 96.0 98.7 98.0  MCH 29.7 29.3 30.5  MCHC 31.0 29.7* 31.2  RDW 17.1* 17.2* 16.8*  PLT 161 185 181   BNP Recent Labs  Lab 04/13/21 1724 04/17/21 0646  BNP 305.0* 382.0*    DDimer No results for input(s): DDIMER in the last 168 hours.   Radiology  No results found.  Cardiac Studies  TTE 04/14/2021  1. Left ventricular ejection fraction, by estimation, is 35 to 40%. The  left ventricle has moderately decreased function. The left ventricle  demonstrates global hypokinesis. There is mild left ventricular  hypertrophy. Left ventricular diastolic  parameters are indeterminate. In Afib with RVR during study, consider  repeat limited echo to better evaluate systolic function once rates better  controlled.   2. Right ventricular systolic function is mildly reduced. The right  ventricular size is normal. There is severely elevated pulmonary artery  systolic pressure. The estimated right ventricular systolic pressure is  68.0 mmHg.   3. Left atrial size was severely dilated.   4. Right atrial size was severely dilated.   5. The mitral valve is normal in structure. Trivial mitral valve  regurgitation.   6. The aortic valve is tricuspid. Aortic valve regurgitation is not  visualized. No aortic stenosis is present.   7. The inferior vena cava is dilated in size with <50% respiratory  variability, suggesting right atrial pressure of 15 mmHg.    [place] Records Subsequent available records from [place] reviewed under Media: - [place] (?this is the date) - 30-35% glob HK, inf wall AK/LM:OK/LAD: TO prox, DX1  90%/LCx: OM1 TO, LCx 50% distal/RCA: TO ostium, LIMA LAD: distal apical LAD has 80-90%, suspect culprit vessel/SVG PDA OK, med tx - 10213 Lexiscan positive middistal Cx, RV  dilated, EF 40% - Adenosine MPI 08/2018 EF 37%, poor quality study, apical infarct as well as inferior septal wall infarct, no obvious ischemia, however, LV does dilate with stress.  - 01/10/20 TTE suboptimal in spite of definity, EF 40%, mild-moderate MR, mild-moderate TR - Hx of pulmonary HTN, seen by [facility] by Dr. Gilles Chiquito, apparently mild pulmonary HTN, 07/14/17 RHC at [facility] - per Dr. Gilles Chiquito normal CO and PVR, no pulmonary HTN at that time  Patient Profile  Tommy Riley is a 75 y.o. male with CAD status post CABG, persistent atrial fibrillation status post ablation (failed, now with rate control strategy), diabetes, COPD, left bundle branch block, CKD stage III, hypertension, hyperlipidemia, stroke, pulmonary hypertension who was admitted on 04/14/2021 for acute on chronic systolic heart failure.  Assessment & Plan   #Acute on chronic systolic heart failure, 35 to 40% #Presumed ischemic cardiomyopathy #Left bundle branch block, QRS 150 ms -Admitted with acute on chronic systolic heart failure.  Net -3 L since admission.  He is approaching euvolemia.  Creatinine is coming down. -Would recommend 1 more day of IV diuretic therapy.  Transition to 40 mg of p.o. torsemide tomorrow. -A. fib with left bundle branch block is the main issue.  He has had failed A. fib ablation in the past.  I think the next best step for him would be to be considered for an AV node ablation with CRT-P/D.  This will likely happen as an outpatient. -I think he is approaching euvolemia and likely can be discharged tomorrow.  I have transitioned his metoprolol to metoprolol succinate 200 mg daily.  Would recommend he continue digoxin 0.125 mg daily.  Continue Entresto 24-26 mg twice daily.  Aldactone 12.5 mg daily has been added.  Jardiance 10 mg daily as  well. -I think he can be discharged on the above regimen likely tomorrow. -We will work to arrange EP follow-up in [place]  #Afib with RVR -Continue with rate control of metoprolol succinate 200 mg daily.  Continue digoxin 0.125 mg daily. -We will transition to Eliquis this admission.  This is a better agent for him moving forward. -We will work to arrange EP follow-up for consideration of AV node ablation with CRT-P/D.  #CAD status post CABG -No symptoms of angina. -Continue aspirin, statin and Zetia.  #CKD II/III -kidney function stable. eGFR >60.   #Elevated troponin, demand ischemia -Secondary to acute systolic heart failure.  This is not an acute coronary syndrome.  [facility] will sign off.   Medication Recommendations: Heart failure medications as above. Other recommendations (labs, testing, etc): None. Follow up as an outpatient: Anticipate discharge tomorrow on 40 mg of torsemide daily.  He should not need potassium supplementation I have added Aldactone.  We will arrange outpatient EP follow-up as well as general follow-up in our office.   For questions or updates, please contact [place] Please consult [email or web address] for contact info under   Time Spent with Patient: I have spent a total of 35 minutes with patient reviewing hospital notes, telemetry, EKGs, labs and examining the patient as well as establishing an assessment and plan that was discussed with the patient.  > 50% of time was spent in direct patient care.    Signed, Addison Naegeli. Audie Box, MD, [place]  04/18/2021 9:03 AM

## 2021-04-18 NOTE — Progress Notes (Signed)
SATURATION QUALIFICATIONS: Patient Saturations on Room Air at Rest = 92%  Patient Saturations on Room Air while Ambulating = 88%  Patient Saturations on 2 Liters of oxygen while Ambulating = 94%  Patient will need oxygen due to destat while ambulating

## 2021-04-19 ENCOUNTER — Encounter (HOSPITAL_COMMUNITY): Payer: Self-pay | Admitting: Family Medicine

## 2021-04-19 LAB — GLUCOSE, CAPILLARY
Glucose-Capillary: 204 mg/dL — ABNORMAL HIGH (ref 70–99)
Glucose-Capillary: 191 mg/dL — ABNORMAL HIGH (ref 70–99)

## 2021-04-19 MED ORDER — DIGOXIN 125 MCG PO TABS: 0.1250 mg | ORAL_TABLET | Freq: Every day | ORAL | 0 refills | Status: DC

## 2021-04-19 MED ORDER — APIXABAN 5 MG PO TABS: 5.0000 mg | ORAL_TABLET | Freq: Two times a day (BID) | ORAL | 0 refills | Status: DC

## 2021-04-19 MED ORDER — SPIRONOLACTONE 25 MG PO TABS
12.5000 mg | ORAL_TABLET | Freq: Every day | ORAL | 0 refills | Status: DC
Start: 2021-04-20 — End: 2021-05-09

## 2021-04-19 MED ORDER — METOPROLOL SUCCINATE ER 200 MG PO TB24: 200.0000 mg | ORAL_TABLET | Freq: Every day | ORAL | 0 refills | Status: DC

## 2021-04-19 NOTE — TOC Transition Note (Signed)
Transition of Care [facility]) - CM/SW Discharge Note   Patient Details  Name: Tommy Riley MRN: [id number] Date of Birth: May 31, 1946  [facility] CM/SW Contact:  Natasha Bence, LCSW Phone Number: 04/19/2021, 11:35 AM   Clinical Narrative:    CSW notified of patient's readiness of discharge. CSW notified Corene Cornea with [facility] agreeable to provide [facility] services upon discharge. Patient does not meet O2 qualification. TOC signing off.    Final next level of care: [place] Barriers to Discharge: Barriers Resolved   Patient Goals and CMS Choice Patient states their goals for this hospitalization and ongoing recovery are:: Return home wiht [facility] CMS Medicare.gov Compare Post Acute Care list provided to:: Patient Choice offered to / list presented to : Patient  Discharge Placement                    Patient and family notified of of transfer: 04/19/21  Discharge Plan and Services In-house Referral: Clinical Social Work   Post Acute Care Choice: Home Health                    HH Arranged: RN, PT [facility] Agency: [place] ([place]) Date [place]: 04/19/21 Time [place]: [place] Representative spoke with at [place]: [place] ([place]) Interventions     Readmission Risk Interventions Readmission Risk Prevention Plan 04/16/2021  Transportation Screening Complete  Home Care Screening Complete  Medication Review (RN CM) Complete  Some recent data might be hidden

## 2021-04-19 NOTE — Progress Notes (Signed)
SATURATION QUALIFICATIONS: Patient Saturations on Room Air at Rest = 92%  Patient Saturations on Room Air while Ambulating = 93%   Repeated walk and patient did very well. He will not need oxygen upon discharge. Notified MD

## 2021-04-19 NOTE — Discharge Summary (Signed)
Physician Discharge Summary  Tommy Riley OIZ:[id number] DOB: [birth year] DOA: 04/13/2021  PCP: Clinton Quant, MD  Admit date: 04/13/2021 Discharge date: 04/19/2021  Admitted From: home  Disposition:   home   Recommendations for Outpatient Follow-up and new medication changes:  Follow up with Dr. Harmon Pier in 7 to 10 days.  Added digoxin for rate control Increased metoprolol succinate from 50 to 100 mg  Spironolactone dose change to 12.5 mg.  Follow up renal function as outpatient.    Home Health: no   Equipment/Devices: na  Discharge Condition: stable  CODE STATUS:  full Diet recommendation:  heart healthy   Brief/Interim Summary: Mr. Bierlein was admitted to the hospital with the working diagnosis of acute diastolic heart failure exacerbation.   75 yo male with the past medical history of T2DM, dyslipidemia, CHF, CAD sp CABG, COPD, OSA, and gout who presented with dyspnea. Reported 3 to 4 days of cough, associated with fever and chills. On his initial physical examination his blood pressure was 113/73, oxygen saturation 89 to 91%, heart rate 91, respiratory rate 25, temperature 99.  His lungs had rales bilaterally, heart S1-S2, present, irregularly irregular, abdomen soft, no lower extremity edema.  Sodium 138, potassium 4.8, chloride 102, bicarb 27, glucose 128, BUN 42, creatinine 1.64, height 63.127-118, white count 6.8, hemotwelve 0.4, hematocrit 40.2, platelets 153. SARS COVID-19 negative.  Urinalysis specific gravity 1.010.  Negative nitrates.  Chest radiograph with increased hilar vascular congestion.  EKG 88 bpm, rightward axis, normal QTC, left bundle branch block, atrial fibrillation rhythm, no significant ST segment changes, lead II-III-aVF T wave versions.  Patient was admitted to the telemetry ward, he received diuresis with intravenous furosemide. Bronchodilator therapy and systemic corticosteroids.  Follow-up echocardiography showed ejection fraction 35  to 40% with global hypokinesis. Heart failure guideline therapy was started with metoprolol, Entresto, empagliflozin and spironolactone.  Patient's symptoms improved, he will be discharged home, with close follow-up as an outpatient.  Acute on chronic diastolic heart failure exacerbation, acute hypoxic respiratory failure due to cardiogenic pulmonary edema. CAD/ sp CABG Patient was diuresed with intravenous furosemide, and negative fluid balance was achieved, (-5,600), with significant improvement of his symptoms. His echocardiography showed reduced LV systolic function 35 to 33%, presumed ischemic cardiomyopathy..  He was placed on guideline directed therapy for heart failure with good toleration. For his coronary artery disease, he had no anginal symptoms, continue aspirin and statin therapy.  2.  Chronic atrial fibrillation with rapid ventricular response.  Left bundle branch block.  Rate control achieved with metoprolol succinate, 200 g daily along with digoxin. Anticoagulation regimen was switched from warfarin to apixaban with good toleration. Patient will need outpatient follow-up with cardiology for consideration of AV node ablation with CRT.  3.  Acute COPD exacerbation.  Patient received aggressive bronchodilator therapy, supplemental oxygen per nasal cannula and airway clearing techniques. Symptoms have been improving. His oxygenation discharge is 99% on 2 L per nasal cannula.  4.  Acute kidney injury chronic kidney disease stage IIIa.  Patient received diuresis with intravenous furosemide. His kidney function has been improving, at discharge creatinine 1.26, sodium 138, potassium 3.8 and bicarb 27. Follow-up kidney function as an outpatient. Continue furosemide and spironolactone.  5.  Acute on chronic anemia.  Hemoglobin and hematocrit remained stable, continue anticoagulation with apixaban.  6.  Obesity class II, OSA.  Calculated BMI 39.4, continue home CPAP.  7.  Type 2  diabetes mellitus, hyperglycemia. dyslipidemia During his hospitalization he received insulin sliding scale for  glucose coverage monitoring. Basal insulin 12 units twice daily and Premeal insulin 2 units with meals. Continue with ezetimibe and rosuvastatin.   Discharge Diagnoses:  Principal Problem:   Acute exacerbation of CHF (congestive heart failure) (HCC) Active Problems:   COPD (chronic obstructive pulmonary disease) (HCC)   S/P CABG (coronary artery bypass graft)   Atrial fibrillation (HCC)   OSA on CPAP   Diabetes mellitus type 2 with complications (HCC)   Chronic respiratory failure with hypoxia (HCC)   Elevated brain natriuretic peptide (BNP) level   Elevated troponin I level   Hyperglycemia due to diabetes mellitus (HCC)   Acute respiratory failure with hypoxia (HCC)   Shortness of breath   Fall at home, initial encounter   Ecchymosis    Discharge Instructions   Allergies as of 04/19/2021       Reactions   Codeine Anaphylaxis   Tightness in chest        Medication List     STOP taking these medications    insulin aspart 100 UNIT/ML injection Commonly known as: novoLOG   KLOR-CON 10 PO   warfarin 3 MG tablet Commonly known as: COUMADIN       TAKE these medications    Accu-Chek Aviva Plus test strip Generic drug: glucose blood   albuterol (2.5 MG/3ML) 0.083% nebulizer solution Commonly known as: PROVENTIL Take 3 mLs (2.5 mg total) by nebulization every 6 (six) hours as needed for wheezing or shortness of breath.   albuterol 108 (90 Base) MCG/ACT inhaler Commonly known as: VENTOLIN HFA USE 2 INHALATIONS BY MOUTH  EVERY 6 HOURS AS NEEDED FOR WHEEZING OR SHORTNESS OF  BREATH   allopurinol 100 MG tablet Commonly known as: ZYLOPRIM Take 2 tablets in the morning and 1 tablet in the evening   apixaban 5 MG Tabs tablet Commonly known as: ELIQUIS Take 1 tablet (5 mg total) by mouth 2 (two) times daily.   aspirin 81 MG tablet Take 81 mg by  mouth daily.   b complex vitamins tablet Take 1 tablet by mouth daily.   CENTRUM SILVER PO Take 1 tablet by mouth daily.   COLD AND FLU PO Take 1 tablet by mouth daily as needed (cough aches).   digoxin 0.125 MG tablet Commonly known as: LANOXIN Take 1 tablet (0.125 mg total) by mouth daily. Start taking on: April 20, 2021   diphenhydramine-acetaminophen 25-500 MG Tabs tablet Commonly known as: TYLENOL PM Take 2 tablets every day by oral route.   Entresto 24-26 MG Generic drug: sacubitril-valsartan Take 1 tablet by mouth 2 (two) times daily.   ezetimibe 10 MG tablet Commonly known as: ZETIA Take 10 mg by mouth daily.   Farxiga 10 MG Tabs tablet Generic drug: dapagliflozin propanediol Take 1 tablet (10 mg total) by mouth daily.   folic acid 751 MCG tablet Commonly known as: FOLVITE Take 400 mcg by mouth daily.   guaiFENesin 600 MG 12 hr tablet Commonly known as: MUCINEX Take 1,200 mg by mouth 2 (two) times daily.   HumaLOG KwikPen 200 UNIT/ML KwikPen Generic drug: insulin lispro Inject 20 Units into the skin every evening.   insulin detemir 100 UNIT/ML injection Commonly known as: LEVEMIR Inject 30 Units into the skin daily.   loratadine 10 MG tablet Commonly known as: CLARITIN Take 10 mg by mouth daily.   MELATONIN PO Take 6 mg by mouth daily.   metoprolol 200 MG 24 hr tablet Commonly known as: TOPROL-XL Take 1 tablet (200 mg total) by mouth daily.  Take with or immediately following a meal. Start taking on: April 20, 2021 What changed:  medication strength how much to take when to take this additional instructions   omega-3 acid ethyl esters 1 g capsule Commonly known as: LOVAZA Take by mouth in the morning, at noon, and at bedtime.   ONE TOUCH ULTRA 2 w/Device Kit by Does not apply route.   oxymetazoline 0.05 % nasal spray Commonly known as: AFRIN Place 1 spray into both nostrils 2 (two) times daily.   rosuvastatin 40 MG  tablet Commonly known as: CRESTOR Take 40 mg by mouth daily.   SERTRALINE HCL PO Take 150 mg by mouth daily.   spironolactone 25 MG tablet Commonly known as: ALDACTONE Take 0.5 tablets (12.5 mg total) by mouth daily. Start taking on: April 20, 2021 What changed:  medication strength how much to take   torsemide 20 MG tablet Commonly known as: DEMADEX Take 40 mg by mouth 2 (two) times daily.   Trelegy Ellipta 100-62.5-25 MCG/INH Aepb Generic drug: Fluticasone-Umeclidin-Vilant Inhale 1 puff into the lungs daily. Patient needs 90 day script with 3 refills.   vitamin C 500 MG tablet Commonly known as: ASCORBIC ACID Take 500 mg by mouth daily.   Vitamin D3 50 MCG (2000 UT) Tabs Take by mouth daily.   Zinc Methionate 50 MG Caps Take 1 capsule by mouth daily.        Follow-up Information     Vickie Epley, MD Follow up on 05/14/2021.   Specialties: Cardiology, Radiology Why: Cardiology Electrophysiology Follow-up on 05/14/2021 at 2:00 PM. Will be at the [facility] off of [facility]. [place] information: [place] [id number] [phone]                Allergies  Allergen Reactions   Codeine Anaphylaxis    Tightness in chest    Consultations: Cardiology    Procedures/Studies: [facility] Chest Port 1 View  Result Date: 04/13/2021 CLINICAL DATA:  Shortness of breath for 1 week that is worsening, cough, and fever starting 12 hours ago, history CHF, COPD, former smoker EXAM: PORTABLE CHEST 1 VIEW COMPARISON:  Portable exam 1733 hours compared to 04/09/2021 FINDINGS: Enlargement of cardiac silhouette post median sternotomy. Mediastinal contours and pulmonary vascularity normal. Atherosclerotic calcification aorta. Slight chronic accentuation of interstitial markings, stable. No acute infiltrate, pleural effusion, or pneumothorax. Osseous structures unremarkable. IMPRESSION: Enlargement of cardiac silhouette. No definite acute  infiltrate. Electronically Signed   By: Lavonia Dana M.D.   On: 04/13/2021 18:00   ECHOCARDIOGRAM COMPLETE  Result Date: 04/14/2021    ECHOCARDIOGRAM REPORT   Patient Name:   JAREB RADONCIC Date of Exam: 04/14/2021 Medical Rec #:  [id number]        Height:       71.5 in Accession #:    [id number]       Weight:       277.0 lb Date of Birth:  09-08-1945       BSA:          2.434 m Patient Age:    37 years         BP:           108//60 mmHg Patient Gender: M                HR:           105 bpm. Exam Location:  [facility] Procedure: 2D Echo, Cardiac Doppler and Color Doppler Indications:  CHF-Acute Diastolic  History:         Patient has no prior history of Echocardiogram examinations.                  CAD, Prior CABG, COPD, Arrythmias:Atrial Fibrillation,                  Signs/Symptoms:Shortness of Breath; Risk Factors:Hypertension                  and Diabetes.  Sonographer:     Wenda Low Referring Phys:  [id number] OLADAPO ADEFESO Diagnosing Phys: Oswaldo Milian MD IMPRESSIONS  1. Left ventricular ejection fraction, by estimation, is 35 to 40%. The left ventricle has moderately decreased function. The left ventricle demonstrates global hypokinesis. There is mild left ventricular hypertrophy. Left ventricular diastolic parameters are indeterminate. In Afib with RVR during study, consider repeat limited echo to better evaluate systolic function once rates better controlled.  2. Right ventricular systolic function is mildly reduced. The right ventricular size is normal. There is severely elevated pulmonary artery systolic pressure. The estimated right ventricular systolic pressure is 32.2 mmHg.  3. Left atrial size was severely dilated.  4. Right atrial size was severely dilated.  5. The mitral valve is normal in structure. Trivial mitral valve regurgitation.  6. The aortic valve is tricuspid. Aortic valve regurgitation is not visualized. No aortic stenosis is present.  7. The inferior vena cava is  dilated in size with <50% respiratory variability, suggesting right atrial pressure of 15 mmHg. FINDINGS  Left Ventricle: Left ventricular ejection fraction, by estimation, is 35 to 40%. The left ventricle has moderately decreased function. The left ventricle demonstrates global hypokinesis. Definity contrast agent was given IV to delineate the left ventricular endocardial borders. The left ventricular internal cavity size was normal in size. There is mild left ventricular hypertrophy. Left ventricular diastolic parameters are indeterminate. Right Ventricle: The right ventricular size is normal. Right vetricular wall thickness was not well visualized. Right ventricular systolic function is mildly reduced. There is severely elevated pulmonary artery systolic pressure. The tricuspid regurgitant velocity is 3.57 m/s, and with an assumed right atrial pressure of 15 mmHg, the estimated right ventricular systolic pressure is 02.5 mmHg. Left Atrium: Left atrial size was severely dilated. Right Atrium: Right atrial size was severely dilated. Pericardium: There is no evidence of pericardial effusion. Mitral Valve: The mitral valve is normal in structure. Trivial mitral valve regurgitation. MV peak gradient, 8.2 mmHg. The mean mitral valve gradient is 4.0 mmHg. Tricuspid Valve: The tricuspid valve is normal in structure. Tricuspid valve regurgitation is trivial. Aortic Valve: The aortic valve is tricuspid. Aortic valve regurgitation is not visualized. No aortic stenosis is present. Aortic valve mean gradient measures 3.7 mmHg. Aortic valve peak gradient measures 7.9 mmHg. Aortic valve area, by VTI measures 2.55 cm. Pulmonic Valve: The pulmonic valve was not well visualized. Pulmonic valve regurgitation is not visualized. Aorta: The aortic root is normal in size and structure. Venous: The inferior vena cava is dilated in size with less than 50% respiratory variability, suggesting right atrial pressure of 15 mmHg. IAS/Shunts:  The interatrial septum was not well visualized.  LEFT VENTRICLE PLAX 2D LVIDd:         5.40 cm   Diastology LVIDs:         4.55 cm   LV e' medial:    9.00 cm/s LV PW:         1.30 cm   LV E/e' medial:  14.7 LV  IVS:        1.30 cm   LV e' lateral:   13.00 cm/s LVOT diam:     2.00 cm   LV E/e' lateral: 10.2 LV SV:         52 LV SV Index:   21 LVOT Area:     3.14 cm  RIGHT VENTRICLE RV Basal diam:  3.50 cm RV Mid diam:    2.50 cm LEFT ATRIUM              Index        RIGHT ATRIUM           Index LA diam:        6.30 cm  2.59 cm/m   RA Area:     33.50 cm LA Vol (A2C):   162.0 ml 66.55 ml/m  RA Volume:   120.00 ml 49.30 ml/m LA Vol (A4C):   170.0 ml 69.84 ml/m LA Biplane Vol: 167.0 ml 68.60 ml/m  AORTIC VALVE                    PULMONIC VALVE AV Area (Vmax):    2.30 cm     PV Vmax:       1.03 m/s AV Area (Vmean):   2.39 cm     PV Peak grad:  4.2 mmHg AV Area (VTI):     2.55 cm AV Vmax:           140.67 cm/s AV Vmean:          85.267 cm/s AV VTI:            0.202 m AV Peak Grad:      7.9 mmHg AV Mean Grad:      3.7 mmHg LVOT Vmax:         103.00 cm/s LVOT Vmean:        64.950 cm/s LVOT VTI:          0.164 m LVOT/AV VTI ratio: 0.81  AORTA Ao Root diam: 3.30 cm MITRAL VALVE                TRICUSPID VALVE MV Area (PHT): 4.15 cm     TR Peak grad:   51.0 mmHg MV Area VTI:   1.91 cm     TR Vmax:        357.00 cm/s MV Peak grad:  8.2 mmHg MV Mean grad:  4.0 mmHg     SHUNTS MV Vmax:       1.43 m/s     Systemic VTI:  0.16 m MV Vmean:      87.7 cm/s    Systemic Diam: 2.00 cm MV Decel Time: 183 msec MV E velocity: 132.00 cm/s Oswaldo Milian MD Electronically signed by Oswaldo Milian MD Signature Date/Time: 04/14/2021/12:02:52 PM    Final (Updated)       Subjective: Patient is feeling better, dyspnea has improved, no chest pain. No nausea or vomiting,.   Discharge Exam: Vitals:   04/19/21 0511 04/19/21 0605  BP: (!) 109/59 (!) 114/93  Pulse: 93 70  Resp: 18   Temp: 98.1 F (36.7 C)   SpO2:  99%    Vitals:   04/18/21 2051 04/19/21 0511 04/19/21 0528 04/19/21 0605  BP: (!) 109/59 (!) 109/59  (!) 114/93  Pulse: 93 93  70  Resp:  18    Temp: 98.1 F (36.7 C) 98.1 F (36.7 C)    TempSrc: Oral Oral    SpO2: 95%  99%  Weight:  129.5 kg 128.4 kg   Height:  _0  (1.803 m)      General: Not in pain or dyspnea.  Neurology: Awake and alert, non focal  E ENT: mild pallor, no icterus, oral mucosa moist Cardiovascular: No JVD. S1-S2 present, rhythmic, no gallops, rubs, or murmurs. No lower extremity edema. Pulmonary: positive breath sounds bilaterally, adequate air movement, no wheezing, rhonchi or rales. Gastrointestinal. Abdomen soft and non tender Skin. No rashes Musculoskeletal: no joint deformities   The results of significant diagnostics from this hospitalization (including imaging, microbiology, ancillary and laboratory) are listed below for reference.     Microbiology: Recent Results (from the past 240 hour(s))  Resp Panel by RT-PCR (Flu A&B, Covid) Nasopharyngeal Swab     Status: None   Collection Time: 04/13/21  5:10 PM   Specimen: Nasopharyngeal Swab; Nasopharyngeal(NP) swabs in vial transport medium  Result Value Ref Range Status   SARS Coronavirus 2 by RT PCR NEGATIVE NEGATIVE Final    Comment: (NOTE) SARS-CoV-2 target nucleic acids are NOT DETECTED.  The SARS-CoV-2 RNA is generally detectable in upper respiratory specimens during the acute phase of infection. The lowest concentration of SARS-CoV-2 viral copies this assay can detect is 138 copies/mL. A negative result does not preclude SARS-Cov-2 infection and should not be used as the sole basis for treatment or other patient management decisions. A negative result may occur with  improper specimen collection/handling, submission of specimen other than nasopharyngeal swab, presence of viral mutation(s) within the areas targeted by this assay, and inadequate number of viral copies(<138 copies/mL). A negative  result must be combined with clinical observations, patient history, and epidemiological information. The expected result is Negative.  Fact Sheet for Patients:  EntrepreneurPulse.com.au  Fact Sheet for Healthcare Providers:  IncredibleEmployment.be  This test is no t yet approved or cleared by the [place] FDA and  has been authorized for detection and/or diagnosis of SARS-CoV-2 by FDA under an Emergency Use Authorization (EUA). This EUA will remain  in effect (meaning this test can be used) for the duration of the COVID-19 declaration under Section 564(b)(1) of the Act, 21 U.S.C.section 360bbb-3(b)(1), unless the authorization is terminated  or revoked sooner.       Influenza A by PCR NEGATIVE NEGATIVE Final   Influenza B by PCR NEGATIVE NEGATIVE Final    Comment: (NOTE) The Xpert Xpress SARS-CoV-2/FLU/RSV plus assay is intended as an aid in the diagnosis of influenza from Nasopharyngeal swab specimens and should not be used as a sole basis for treatment. Nasal washings and aspirates are unacceptable for Xpert Xpress SARS-CoV-2/FLU/RSV testing.  Fact Sheet for Patients: EntrepreneurPulse.com.au  Fact Sheet for Healthcare Providers: IncredibleEmployment.be  This test is not yet approved or cleared by the [place] FDA and has been authorized for detection and/or diagnosis of SARS-CoV-2 by FDA under an Emergency Use Authorization (EUA). This EUA will remain in effect (meaning this test can be used) for the duration of the COVID-19 declaration under Section 564(b)(1) of the Act, 21 U.S.C. section 360bbb-3(b)(1), unless the authorization is terminated or revoked.  Performed at [facility], [address], [place], [place] [address]   Culture, blood (routine x 2)     Status: None   Collection Time: 04/13/21  5:40 PM   Specimen: BLOOD RIGHT HAND  Result Value Ref Range Status   Specimen  Description BLOOD RIGHT HAND  Final   Special Requests   Final    BOTTLES DRAWN AEROBIC AND ANAEROBIC Blood  Culture results may not be optimal due to an excessive volume of blood received in culture bottles   Culture   Final    NO GROWTH 5 DAYS Performed at [facility], [address] [place], [place] [address]    Report Status 04/18/2021 FINAL  Final  Culture, blood (routine x 2)     Status: None   Collection Time: 04/13/21  6:07 PM   Specimen: Left Antecubital; Blood  Result Value Ref Range Status   Specimen Description LEFT ANTECUBITAL  Final   Special Requests   Final    BOTTLES DRAWN AEROBIC AND ANAEROBIC Blood Culture adequate volume   Culture   Final    NO GROWTH 5 DAYS Performed at [facility], [address], [place], [place] [address]    Report Status 04/18/2021 FINAL  Final     Labs: BNP (last 3 results) Recent Labs    04/13/21 1724 04/17/21 0646  BNP 305.0* 497.0*   Basic Metabolic Panel: Recent Labs  Lab 04/13/21 1724 04/14/21 0520 04/16/21 0637 04/17/21 0646 04/18/21 0108  NA 138 137 138 138 138  K 4.8 4.8 4.1 4.0 3.8  CL 102 101 104 101 103  CO2 _0 GLUCOSE 128* 234* 210* 184* 174*  BUN 42* 43* 47* 53* 51*  CREATININE 1.64* 1.62* 1.25* 1.27* 1.26*  CALCIUM 8.9 9.0 9.1 8.7* 8.7*  MG  --  2.3 2.4 2.3 2.2  PHOS  --  3.7 3.6  --   --    Liver Function Tests: Recent Labs  Lab 04/14/21 0520 04/16/21 0637  AST 34  --   ALT 44  --   ALKPHOS 87  --   BILITOT 1.3*  --   PROT 6.9  --   ALBUMIN 3.6 3.7   No results for input(s): LIPASE, AMYLASE in the last 168 hours. No results for input(s): AMMONIA in the last 168 hours. CBC: Recent Labs  Lab 04/13/21 1724 04/14/21 0520 04/16/21 0637 04/18/21 0108  WBC 6.8 7.0 9.8 9.0  NEUTROABS 5.4  --   --   --   HGB 12.4* 12.6* 13.7 12.4*  HCT 40.2 40.7 46.2 39.8  MCV 96.4 96.0 98.7 98.0  PLT 153 161 185 181   Cardiac Enzymes: No results for input(s): CKTOTAL, CKMB, CKMBINDEX, TROPONINI  in the last 168 hours. BNP: Invalid input(s): POCBNP CBG: Recent Labs  Lab 04/18/21 0747 04/18/21 1134 04/18/21 1712 04/18/21 2052 04/19/21 0733  GLUCAP 142* 166* 169* 253* 204*   D-Dimer No results for input(s): DDIMER in the last 72 hours. Hgb A1c No results for input(s): HGBA1C in the last 72 hours. Lipid Profile No results for input(s): CHOL, HDL, LDLCALC, TRIG, CHOLHDL, LDLDIRECT in the last 72 hours. Thyroid function studies No results for input(s): TSH, T4TOTAL, T3FREE, THYROIDAB in the last 72 hours.  Invalid input(s): FREET3 Anemia work up No results for input(s): VITAMINB12, FOLATE, FERRITIN, TIBC, IRON, RETICCTPCT in the last 72 hours. Urinalysis    Component Value Date/Time   COLORURINE YELLOW 04/13/2021 2026   APPEARANCEUR CLEAR 04/13/2021 2026   LABSPEC 1.010 04/13/2021 2026   PHURINE 5.0 04/13/2021 2026   GLUCOSEU >=500 (A) 04/13/2021 2026   HGBUR NEGATIVE 04/13/2021 2026   BILIRUBINUR NEGATIVE 04/13/2021 2026   [place] NEGATIVE 04/13/2021 2026   PROTEINUR NEGATIVE 04/13/2021 2026   NITRITE NEGATIVE 04/13/2021 2026   LEUKOCYTESUR NEGATIVE 04/13/2021 2026   Sepsis Labs Invalid input(s): PROCALCITONIN,  WBC,  LACTICIDVEN Microbiology Recent Results (from the  past 240 hour(s))  Resp Panel by RT-PCR (Flu A&B, Covid) Nasopharyngeal Swab     Status: None   Collection Time: 04/13/21  5:10 PM   Specimen: Nasopharyngeal Swab; Nasopharyngeal(NP) swabs in vial transport medium  Result Value Ref Range Status   SARS Coronavirus 2 by RT PCR NEGATIVE NEGATIVE Final    Comment: (NOTE) SARS-CoV-2 target nucleic acids are NOT DETECTED.  The SARS-CoV-2 RNA is generally detectable in upper respiratory specimens during the acute phase of infection. The lowest concentration of SARS-CoV-2 viral copies this assay can detect is 138 copies/mL. A negative result does not preclude SARS-Cov-2 infection and should not be used as the sole basis for treatment or other  patient management decisions. A negative result may occur with  improper specimen collection/handling, submission of specimen other than nasopharyngeal swab, presence of viral mutation(s) within the areas targeted by this assay, and inadequate number of viral copies(<138 copies/mL). A negative result must be combined with clinical observations, patient history, and epidemiological information. The expected result is Negative.  Fact Sheet for Patients:  EntrepreneurPulse.com.au  Fact Sheet for Healthcare Providers:  IncredibleEmployment.be  This test is no t yet approved or cleared by the [place] FDA and  has been authorized for detection and/or diagnosis of SARS-CoV-2 by FDA under an Emergency Use Authorization (EUA). This EUA will remain  in effect (meaning this test can be used) for the duration of the COVID-19 declaration under Section 564(b)(1) of the Act, 21 U.S.C.section 360bbb-3(b)(1), unless the authorization is terminated  or revoked sooner.       Influenza A by PCR NEGATIVE NEGATIVE Final   Influenza B by PCR NEGATIVE NEGATIVE Final    Comment: (NOTE) The Xpert Xpress SARS-CoV-2/FLU/RSV plus assay is intended as an aid in the diagnosis of influenza from Nasopharyngeal swab specimens and should not be used as a sole basis for treatment. Nasal washings and aspirates are unacceptable for Xpert Xpress SARS-CoV-2/FLU/RSV testing.  Fact Sheet for Patients: EntrepreneurPulse.com.au  Fact Sheet for Healthcare Providers: IncredibleEmployment.be  This test is not yet approved or cleared by the [place] FDA and has been authorized for detection and/or diagnosis of SARS-CoV-2 by FDA under an Emergency Use Authorization (EUA). This EUA will remain in effect (meaning this test can be used) for the duration of the COVID-19 declaration under Section 564(b)(1) of the Act, 21 U.S.C. section  360bbb-3(b)(1), unless the authorization is terminated or revoked.  Performed at [facility], [address] [place], [place] [address]   Culture, blood (routine x 2)     Status: None   Collection Time: 04/13/21  5:40 PM   Specimen: BLOOD RIGHT HAND  Result Value Ref Range Status   Specimen Description BLOOD RIGHT HAND  Final   Special Requests   Final    BOTTLES DRAWN AEROBIC AND ANAEROBIC Blood Culture results may not be optimal due to an excessive volume of blood received in culture bottles   Culture   Final    NO GROWTH 5 DAYS Performed at [facility], [address] [place],  [address]    Report Status 04/18/2021 FINAL  Final  Culture, blood (routine x 2)     Status: None   Collection Time: 04/13/21  6:07 PM   Specimen: Left Antecubital; Blood  Result Value Ref Range Status   Specimen Description LEFT ANTECUBITAL  Final   Special Requests   Final    BOTTLES DRAWN AEROBIC AND ANAEROBIC Blood Culture adequate volume   Culture   Final  NO GROWTH 5 DAYS Performed at [facility], [address], [place], [place] [address]    Report Status 04/18/2021 FINAL  Final     Time coordinating discharge: 45 minutes  SIGNED:   Tawni Millers, MD  [facility] 04/19/2021, 9:56 AM

## 2021-04-21 ENCOUNTER — Telehealth: Payer: Self-pay | Admitting: Cardiology

## 2021-04-21 ENCOUNTER — Ambulatory Visit: Payer: Self-pay | Admitting: *Deleted

## 2021-04-21 ENCOUNTER — Telehealth: Payer: Self-pay | Admitting: Emergency Medicine

## 2021-04-21 MED ORDER — TRELEGY ELLIPTA 100-62.5-25 MCG/INH IN AEPB: 1.0000 | INHALATION_SPRAY | Freq: Every day | RESPIRATORY_TRACT | 3 refills | Status: DC

## 2021-04-21 NOTE — Telephone Encounter (Signed)
Patient would like call back about his INR

## 2021-04-21 NOTE — Telephone Encounter (Signed)
Called pt.  He was in [facility] 10/9 - 10/15 for CHF and AFib/RVR.  He was changed from warfarin to Eliquis 5mg  twice daily and wanted to let me know. Hospital records reviewed.  Appreciated call.

## 2021-04-21 NOTE — Telephone Encounter (Signed)
CY would you be willing to sign for a [employer] rx that needs to be filled for pt through pt assistance.  RB is working in the hospital all week.  thanks

## 2021-04-22 MED ORDER — TRELEGY ELLIPTA 100-62.5-25 MCG/INH IN AEPB: 1.0000 | INHALATION_SPRAY | Freq: Every day | RESPIRATORY_TRACT | 5 refills | Status: DC

## 2021-04-22 MED ORDER — TRELEGY ELLIPTA 100-62.5-25 MCG/INH IN AEPB: 1.0000 | INHALATION_SPRAY | Freq: Every day | RESPIRATORY_TRACT | 3 refills | Status: DC

## 2021-04-22 NOTE — Telephone Encounter (Signed)
Yes

## 2021-04-22 NOTE — Telephone Encounter (Signed)
Spoke with pt and he is aware that CY will sign the rx and this will be faxed in today.  He gave me the fax number for [facility]

## 2021-04-25 ENCOUNTER — Telehealth: Payer: Self-pay | Admitting: Cardiology

## 2021-04-25 NOTE — Telephone Encounter (Signed)
Tommy Riley from [place]  called regarding patient receiving home health assistance. States that he was recently discharged from [facility].  They are asking for a verbal order. States that patient's PCP suggested calling Cardiology due to heart problems.   [phone].

## 2021-04-25 NOTE — Telephone Encounter (Signed)
Latoria informed and says PCP is unwilling to give orders since hospitalization was cardiac issues Per Ers[employer]ine [employer], home health need verbal orders for skill nursing for teaching/education & PT due to generalized weakness to help with endurance

## 2021-04-28 NOTE — Telephone Encounter (Signed)
Verbal order for skill nursing given to [facility] RN w/[facility]

## 2021-04-30 ENCOUNTER — Ambulatory Visit: Payer: Medicare Other | Admitting: Cardiology

## 2021-04-30 ENCOUNTER — Encounter: Payer: Self-pay | Admitting: Cardiology

## 2021-04-30 ENCOUNTER — Inpatient Hospital Stay (HOSPITAL_COMMUNITY)
Admission: AD | Admit: 2021-04-30 | Discharge: 2021-05-09 | DRG: 222 | Disposition: A | Discharge status: Home Health | Payer: Medicare Other | Attending: Internal Medicine | Admitting: Internal Medicine

## 2021-04-30 ENCOUNTER — Other Ambulatory Visit: Payer: Self-pay

## 2021-04-30 ENCOUNTER — Encounter (HOSPITAL_COMMUNITY): Payer: Self-pay | Admitting: Internal Medicine

## 2021-04-30 VITALS — BP 110/62 | HR 74 | Ht 71.0 in | Wt 288.4 lb

## 2021-04-30 DIAGNOSIS — Z951 Presence of aortocoronary bypass graft: Secondary | ICD-10-CM

## 2021-04-30 DIAGNOSIS — I2510 Atherosclerotic heart disease of native coronary artery without angina pectoris: Secondary | ICD-10-CM | POA: Diagnosis not present

## 2021-04-30 DIAGNOSIS — E1122 Type 2 diabetes mellitus with diabetic chronic kidney disease: Secondary | ICD-10-CM | POA: Diagnosis present

## 2021-04-30 DIAGNOSIS — Z8616 Personal history of COVID-19: Secondary | ICD-10-CM | POA: Diagnosis not present

## 2021-04-30 DIAGNOSIS — M109 Gout, unspecified: Secondary | ICD-10-CM | POA: Diagnosis present

## 2021-04-30 DIAGNOSIS — I272 Pulmonary hypertension, unspecified: Secondary | ICD-10-CM | POA: Diagnosis present

## 2021-04-30 DIAGNOSIS — I255 Ischemic cardiomyopathy: Secondary | ICD-10-CM | POA: Diagnosis present

## 2021-04-30 DIAGNOSIS — Z7984 Long term (current) use of oral hypoglycemic drugs: Secondary | ICD-10-CM

## 2021-04-30 DIAGNOSIS — I25119 Atherosclerotic heart disease of native coronary artery with unspecified angina pectoris: Secondary | ICD-10-CM | POA: Diagnosis present

## 2021-04-30 DIAGNOSIS — F32A Depression, unspecified: Secondary | ICD-10-CM | POA: Diagnosis present

## 2021-04-30 DIAGNOSIS — I447 Left bundle-branch block, unspecified: Secondary | ICD-10-CM | POA: Diagnosis present

## 2021-04-30 DIAGNOSIS — E1169 Type 2 diabetes mellitus with other specified complication: Secondary | ICD-10-CM | POA: Diagnosis present

## 2021-04-30 DIAGNOSIS — Z8673 Personal history of transient ischemic attack (TIA), and cerebral infarction without residual deficits: Secondary | ICD-10-CM

## 2021-04-30 DIAGNOSIS — Z794 Long term (current) use of insulin: Secondary | ICD-10-CM

## 2021-04-30 DIAGNOSIS — I130 Hypertensive heart and chronic kidney disease with heart failure and stage 1 through stage 4 chronic kidney disease, or unspecified chronic kidney disease: Principal | ICD-10-CM | POA: Diagnosis present

## 2021-04-30 DIAGNOSIS — Z803 Family history of malignant neoplasm of breast: Secondary | ICD-10-CM

## 2021-04-30 DIAGNOSIS — I5023 Acute on chronic systolic (congestive) heart failure: Secondary | ICD-10-CM | POA: Diagnosis present

## 2021-04-30 DIAGNOSIS — I4891 Unspecified atrial fibrillation: Secondary | ICD-10-CM | POA: Diagnosis not present

## 2021-04-30 DIAGNOSIS — E785 Hyperlipidemia, unspecified: Secondary | ICD-10-CM | POA: Diagnosis present

## 2021-04-30 DIAGNOSIS — Z7951 Long term (current) use of inhaled steroids: Secondary | ICD-10-CM

## 2021-04-30 DIAGNOSIS — N183 Chronic kidney disease, stage 3 unspecified: Secondary | ICD-10-CM | POA: Diagnosis present

## 2021-04-30 DIAGNOSIS — J438 Other emphysema: Secondary | ICD-10-CM | POA: Diagnosis not present

## 2021-04-30 DIAGNOSIS — Z950 Presence of cardiac pacemaker: Secondary | ICD-10-CM

## 2021-04-30 DIAGNOSIS — J449 Chronic obstructive pulmonary disease, unspecified: Secondary | ICD-10-CM | POA: Diagnosis present

## 2021-04-30 DIAGNOSIS — I4819 Other persistent atrial fibrillation: Secondary | ICD-10-CM | POA: Diagnosis not present

## 2021-04-30 DIAGNOSIS — Z7901 Long term (current) use of anticoagulants: Secondary | ICD-10-CM | POA: Diagnosis not present

## 2021-04-30 DIAGNOSIS — I5043 Acute on chronic combined systolic (congestive) and diastolic (congestive) heart failure: Secondary | ICD-10-CM | POA: Diagnosis present

## 2021-04-30 DIAGNOSIS — F419 Anxiety disorder, unspecified: Secondary | ICD-10-CM | POA: Diagnosis present

## 2021-04-30 DIAGNOSIS — E876 Hypokalemia: Secondary | ICD-10-CM | POA: Diagnosis present

## 2021-04-30 DIAGNOSIS — I2582 Chronic total occlusion of coronary artery: Secondary | ICD-10-CM | POA: Diagnosis present

## 2021-04-30 DIAGNOSIS — Z833 Family history of diabetes mellitus: Secondary | ICD-10-CM

## 2021-04-30 DIAGNOSIS — Z823 Family history of stroke: Secondary | ICD-10-CM

## 2021-04-30 DIAGNOSIS — I509 Heart failure, unspecified: Secondary | ICD-10-CM | POA: Diagnosis not present

## 2021-04-30 DIAGNOSIS — Z6839 Body mass index (BMI) 39.0-39.9, adult: Secondary | ICD-10-CM

## 2021-04-30 DIAGNOSIS — Z9989 Dependence on other enabling machines and devices: Secondary | ICD-10-CM | POA: Diagnosis not present

## 2021-04-30 DIAGNOSIS — I4821 Permanent atrial fibrillation: Secondary | ICD-10-CM

## 2021-04-30 DIAGNOSIS — Z79899 Other long term (current) drug therapy: Secondary | ICD-10-CM | POA: Diagnosis not present

## 2021-04-30 DIAGNOSIS — E118 Type 2 diabetes mellitus with unspecified complications: Secondary | ICD-10-CM | POA: Diagnosis present

## 2021-04-30 DIAGNOSIS — G4733 Obstructive sleep apnea (adult) (pediatric): Secondary | ICD-10-CM | POA: Diagnosis present

## 2021-04-30 DIAGNOSIS — Z8249 Family history of ischemic heart disease and other diseases of the circulatory system: Secondary | ICD-10-CM

## 2021-04-30 DIAGNOSIS — Z7982 Long term (current) use of aspirin: Secondary | ICD-10-CM

## 2021-04-30 DIAGNOSIS — Z825 Family history of asthma and other chronic lower respiratory diseases: Secondary | ICD-10-CM

## 2021-04-30 DIAGNOSIS — I5022 Chronic systolic (congestive) heart failure: Secondary | ICD-10-CM | POA: Diagnosis not present

## 2021-04-30 LAB — GLUCOSE, CAPILLARY: Glucose-Capillary: 162 mg/dL — ABNORMAL HIGH (ref 70–99)

## 2021-04-30 MED ORDER — INSULIN ASPART 100 UNIT/ML IJ SOLN: 20.0000 [IU] | Freq: Every evening | INTRAMUSCULAR | Status: DC

## 2021-04-30 MED ORDER — ASPIRIN 81 MG PO CHEW
81.0000 mg | CHEWABLE_TABLET | Freq: Every day | ORAL | Status: DC
  Administered 2021-05-01 – 2021-05-04 (×4): 81 mg via ORAL
  Filled 2021-04-30 (×4): qty 1

## 2021-04-30 MED ORDER — APIXABAN 5 MG PO TABS
5.0000 mg | ORAL_TABLET | Freq: Two times a day (BID) | ORAL | Status: DC
  Administered 2021-04-30 – 2021-05-04 (×8): 5 mg via ORAL
  Filled 2021-04-30 (×8): qty 1

## 2021-04-30 MED ORDER — SPIRONOLACTONE 12.5 MG HALF TABLET
12.5000 mg | ORAL_TABLET | Freq: Every day | ORAL | Status: DC
  Administered 2021-05-01 – 2021-05-06 (×6): 12.5 mg via ORAL
  Filled 2021-04-30 (×7): qty 1

## 2021-04-30 MED ORDER — FOLIC ACID 1 MG PO TABS
500.0000 ug | ORAL_TABLET | Freq: Every day | ORAL | Status: DC
  Administered 2021-05-01 – 2021-05-09 (×8): 0.5 mg via ORAL
  Filled 2021-04-30 (×8): qty 1

## 2021-04-30 MED ORDER — SERTRALINE HCL 50 MG PO TABS
150.0000 mg | ORAL_TABLET | Freq: Every day | ORAL | Status: DC
  Administered 2021-05-01 – 2021-05-09 (×9): 150 mg via ORAL
  Filled 2021-04-30 (×9): qty 1

## 2021-04-30 MED ORDER — INSULIN LISPRO 200 UNIT/ML ~~LOC~~ SOPN: 20.0000 [IU] | PEN_INJECTOR | Freq: Every evening | SUBCUTANEOUS | Status: DC

## 2021-04-30 MED ORDER — ROSUVASTATIN CALCIUM 20 MG PO TABS
40.0000 mg | ORAL_TABLET | Freq: Every day | ORAL | Status: DC
  Administered 2021-04-30 – 2021-05-08 (×9): 40 mg via ORAL
  Filled 2021-04-30 (×9): qty 2

## 2021-04-30 MED ORDER — INSULIN DETEMIR 100 UNIT/ML ~~LOC~~ SOLN
30.0000 [IU] | Freq: Every day | SUBCUTANEOUS | Status: DC
  Administered 2021-05-01 – 2021-05-05 (×4): 30 [IU] via SUBCUTANEOUS
  Filled 2021-04-30 (×6): qty 0.3

## 2021-04-30 MED ORDER — ONDANSETRON HCL 4 MG/2ML IJ SOLN: 4.0000 mg | Freq: Four times a day (QID) | INTRAMUSCULAR | Status: DC | PRN

## 2021-04-30 MED ORDER — ZOLPIDEM TARTRATE 5 MG PO TABS
5.0000 mg | ORAL_TABLET | Freq: Every evening | ORAL | Status: DC | PRN
  Administered 2021-04-30 – 2021-05-08 (×9): 5 mg via ORAL
  Filled 2021-04-30 (×10): qty 1

## 2021-04-30 MED ORDER — DIGOXIN 125 MCG PO TABS
0.1250 mg | ORAL_TABLET | Freq: Every day | ORAL | Status: DC
  Administered 2021-05-01 – 2021-05-09 (×9): 0.125 mg via ORAL
  Filled 2021-04-30 (×9): qty 1

## 2021-04-30 MED ORDER — INSULIN ASPART 100 UNIT/ML IJ SOLN
0.0000 [IU] | Freq: Three times a day (TID) | INTRAMUSCULAR | Status: DC
Start: 2021-05-01 — End: 2021-05-10
  Administered 2021-05-01 (×3): 4 [IU] via SUBCUTANEOUS
  Administered 2021-05-02: 3 [IU] via SUBCUTANEOUS
  Administered 2021-05-02: 7 [IU] via SUBCUTANEOUS
  Administered 2021-05-02: 4 [IU] via SUBCUTANEOUS
  Administered 2021-05-03: 3 [IU] via SUBCUTANEOUS
  Administered 2021-05-04: 4 [IU] via SUBCUTANEOUS
  Administered 2021-05-04: 3 [IU] via SUBCUTANEOUS
  Administered 2021-05-05: 4 [IU] via SUBCUTANEOUS
  Administered 2021-05-06: 3 [IU] via SUBCUTANEOUS
  Administered 2021-05-06: 4 [IU] via SUBCUTANEOUS
  Administered 2021-05-08: 7 [IU] via SUBCUTANEOUS
  Administered 2021-05-08: 3 [IU] via SUBCUTANEOUS
  Administered 2021-05-08: 4 [IU] via SUBCUTANEOUS
  Administered 2021-05-09: 11 [IU] via SUBCUTANEOUS
  Administered 2021-05-09 (×2): 4 [IU] via SUBCUTANEOUS

## 2021-04-30 MED ORDER — SODIUM CHLORIDE 0.9% FLUSH
3.0000 mL | Freq: Two times a day (BID) | INTRAVENOUS | Status: DC
  Administered 2021-04-30 – 2021-05-04 (×9): 3 mL via INTRAVENOUS

## 2021-04-30 MED ORDER — FLUTICASONE FUROATE-VILANTEROL 100-25 MCG/ACT IN AEPB
1.0000 | INHALATION_SPRAY | Freq: Every day | RESPIRATORY_TRACT | Status: DC
  Administered 2021-05-01 – 2021-05-09 (×8): 1 via RESPIRATORY_TRACT
  Filled 2021-04-30: qty 28

## 2021-04-30 MED ORDER — FLUTICASONE PROPIONATE 50 MCG/ACT NA SUSP
1.0000 | Freq: Every day | NASAL | Status: DC
  Administered 2021-05-01 – 2021-05-08 (×7): 1 via NASAL
  Filled 2021-04-30: qty 16

## 2021-04-30 MED ORDER — SODIUM CHLORIDE 0.9% FLUSH: 3.0000 mL | INTRAVENOUS | Status: DC | PRN

## 2021-04-30 MED ORDER — SODIUM CHLORIDE 0.9 % IV SOLN: 250.0000 mL | INTRAVENOUS | Status: DC | PRN

## 2021-04-30 MED ORDER — ACETAMINOPHEN 325 MG PO TABS: 650.0000 mg | ORAL_TABLET | ORAL | Status: DC | PRN

## 2021-04-30 MED ORDER — DAPAGLIFLOZIN PROPANEDIOL 10 MG PO TABS
10.0000 mg | ORAL_TABLET | Freq: Every day | ORAL | Status: DC
  Administered 2021-05-01 – 2021-05-09 (×9): 10 mg via ORAL
  Filled 2021-04-30 (×9): qty 1

## 2021-04-30 MED ORDER — METOPROLOL SUCCINATE ER 100 MG PO TB24
200.0000 mg | ORAL_TABLET | Freq: Every day | ORAL | Status: DC
  Administered 2021-05-01 – 2021-05-09 (×9): 200 mg via ORAL
  Filled 2021-04-30 (×9): qty 2

## 2021-04-30 MED ORDER — GUAIFENESIN ER 600 MG PO TB12
1200.0000 mg | ORAL_TABLET | Freq: Two times a day (BID) | ORAL | Status: DC
  Administered 2021-04-30 – 2021-05-09 (×17): 1200 mg via ORAL
  Filled 2021-04-30 (×17): qty 2

## 2021-04-30 MED ORDER — EZETIMIBE 10 MG PO TABS
10.0000 mg | ORAL_TABLET | Freq: Every day | ORAL | Status: DC
  Administered 2021-05-01 – 2021-05-09 (×9): 10 mg via ORAL
  Filled 2021-04-30 (×9): qty 1

## 2021-04-30 MED ORDER — FUROSEMIDE 10 MG/ML IJ SOLN
80.0000 mg | Freq: Two times a day (BID) | INTRAMUSCULAR | Status: DC
  Administered 2021-04-30 – 2021-05-01 (×2): 80 mg via INTRAVENOUS
  Filled 2021-04-30 (×2): qty 8

## 2021-04-30 MED ORDER — UMECLIDINIUM BROMIDE 62.5 MCG/ACT IN AEPB
1.0000 | INHALATION_SPRAY | Freq: Every day | RESPIRATORY_TRACT | Status: DC
  Administered 2021-05-01 – 2021-05-09 (×8): 1 via RESPIRATORY_TRACT
  Filled 2021-04-30 (×2): qty 7

## 2021-04-30 MED ORDER — FLUTICASONE-UMECLIDIN-VILANT 100-62.5-25 MCG/ACT IN AEPB: 1.0000 | INHALATION_SPRAY | Freq: Every day | RESPIRATORY_TRACT | Status: DC

## 2021-04-30 MED ORDER — ALBUTEROL SULFATE (2.5 MG/3ML) 0.083% IN NEBU
2.5000 mg | INHALATION_SOLUTION | Freq: Four times a day (QID) | RESPIRATORY_TRACT | Status: DC | PRN
  Administered 2021-04-30 – 2021-05-02 (×2): 2.5 mg via RESPIRATORY_TRACT
  Filled 2021-04-30 (×2): qty 3

## 2021-04-30 MED ORDER — SACUBITRIL-VALSARTAN 24-26 MG PO TABS
1.0000 | ORAL_TABLET | Freq: Two times a day (BID) | ORAL | Status: DC
  Administered 2021-04-30 – 2021-05-09 (×18): 1 via ORAL
  Filled 2021-04-30 (×18): qty 1

## 2021-04-30 MED ORDER — LORATADINE 10 MG PO TABS
10.0000 mg | ORAL_TABLET | Freq: Every day | ORAL | Status: DC
  Administered 2021-05-01 – 2021-05-09 (×8): 10 mg via ORAL
  Filled 2021-04-30 (×8): qty 1

## 2021-04-30 MED ORDER — OMEGA-3-ACID ETHYL ESTERS 1 G PO CAPS
2.0000 g | ORAL_CAPSULE | Freq: Two times a day (BID) | ORAL | Status: DC
  Administered 2021-04-30 – 2021-05-09 (×18): 2 g via ORAL
  Filled 2021-04-30 (×18): qty 2

## 2021-04-30 MED ORDER — ALBUTEROL SULFATE HFA 108 (90 BASE) MCG/ACT IN AERS: 2.0000 | INHALATION_SPRAY | Freq: Four times a day (QID) | RESPIRATORY_TRACT | Status: DC | PRN

## 2021-04-30 NOTE — Progress Notes (Signed)
Pt states he tried CPAP without humidification at [facility] and was unable to tolerate. Pt states his wife is bringing his Home CPAP in tomorrow.

## 2021-04-30 NOTE — H&P (Addendum)
Admission History and Physical:     Date:  04/30/2021    ID:  Tommy Riley, DOB 1946/03/06, MRN [id number]   PCP:  Eldridge Abrahams, MD     [facility] Cardiologist:  Nona Dell, MD  [facility] Electrophysiologist:  Lanier Prude, MD    Referring MD: Eldridge Abrahams, MD    Chief Complaint: Atrial fibrillation   History of Present Illness:     Tommy Riley is a 75 y.o. male who presents for an evaluation of atrial fibrillation at the request of Dr. Diona Browner. Their medical history includes atrial fibrillation and flutter post ablation in 2011 at [facility], coronary artery disease, chronic systolic heart failure, CKD 3, COPD, hypertension and morbid obesity.   The patient was recently admitted to [facility] from April 13, 2021 through April 19, 2021 for acutely decompensated combined systolic and diastolic heart failure.  While he was admitted he was diuresed and felt better by the time of discharge although he felt like there was still some fluid on board.  Since going home he is slowly deteriorated and now his breathing is extremely limiting.  He has significant swelling in bilateral lower extremities and abdomen.  He tells me that his diuretic was changed to torsemide and he can tell that he urinates more while on torsemide but he knows that it is "not enough".  No syncope or presyncope.  He is sleeping poorly in the last few days.       Objective        Past Medical History:  Diagnosis Date   Anxiety     Atrial fibrillation and flutter (HCC)      Atrial fibrillation ablation 2011 at [facility]   CAD in native artery     Chronic systolic heart failure (HCC)     CKD (chronic kidney disease), stage III (HCC)     COPD (chronic obstructive pulmonary disease) (HCC)     COVID-22 February 2020   Depression     Essential hypertension     Gout     Hyperlipidemia     Ischemic heart disease     OSA (obstructive sleep apnea)     Stroke  (HCC) 2003   Type 2 diabetes mellitus (HCC)             Past Surgical History:  Procedure Laterality Date   ATRIAL FIBRILLATION ABLATION   2011    [facility]   CATARACT EXTRACTION Bilateral     CORONARY ARTERY BYPASS GRAFT   2001   HERNIA REPAIR Right     TONSILLECTOMY AND ADENOIDECTOMY          Current Medications: Active Medications      Current Meds  Medication Sig   ACCU-CHEK AVIVA PLUS test strip     albuterol (PROVENTIL) (2.5 MG/3ML) 0.083% nebulizer solution Take 3 mLs (2.5 mg total) by nebulization every 6 (six) hours as needed for wheezing or shortness of breath.   albuterol (VENTOLIN HFA) 108 (90 Base) MCG/ACT inhaler USE 2 INHALATIONS BY MOUTH  EVERY 6 HOURS AS NEEDED FOR WHEEZING OR SHORTNESS OF  BREATH   allopurinol (ZYLOPRIM) 100 MG tablet Take 2 tablets in the morning and 1 tablet in the evening   apixaban (ELIQUIS) 5 MG TABS tablet Take 1 tablet (5 mg total) by mouth 2 (two) times daily.   aspirin 81 MG tablet Take 81 mg by mouth daily.   b complex vitamins tablet  Take 1 tablet by mouth daily.   Blood Glucose Monitoring Suppl (ONE TOUCH ULTRA 2) W/DEVICE KIT by Does not apply route.   Cholecalciferol (VITAMIN D3) 2000 UNITS TABS Take by mouth daily.   digoxin (LANOXIN) 0.125 MG tablet Take 1 tablet (0.125 mg total) by mouth daily.   diphenhydramine-acetaminophen (TYLENOL PM) 25-500 MG TABS tablet Take 2 tablets every day by oral route.   ezetimibe (ZETIA) 10 MG tablet Take 10 mg by mouth daily.   FARXIGA 10 MG TABS tablet Take 1 tablet (10 mg total) by mouth daily.   Fluticasone-Umeclidin-Vilant (TRELEGY ELLIPTA) 100-62.5-25 MCG/INH AEPB Inhale 1 puff into the lungs daily. Patient needs 90 day script with 3 refills.   Fluticasone-Umeclidin-Vilant (TRELEGY ELLIPTA) 100-62.5-25 MCG/INH AEPB Inhale 1 puff into the lungs daily. Patient needs 90 day script with 3 refills.   folic acid (FOLVITE) 283 MCG tablet Take 400 mcg by mouth daily.   guaiFENesin (MUCINEX) 600 MG 12 hr  tablet Take 1,200 mg by mouth 2 (two) times daily.   HUMALOG KWIKPEN 200 UNIT/ML KwikPen Inject 20 Units into the skin every evening.   insulin detemir (LEVEMIR) 100 UNIT/ML injection Inject 30 Units into the skin daily.   loratadine (CLARITIN) 10 MG tablet Take 10 mg by mouth daily.   MELATONIN PO Take 6 mg by mouth daily.   metoprolol succinate (TOPROL-XL) 200 MG 24 hr tablet Take 1 tablet (200 mg total) by mouth daily. Take with or immediately following a meal.   Multiple Vitamins-Minerals (CENTRUM SILVER PO) Take 1 tablet by mouth daily.    Nutritional Supplements (COLD AND FLU PO) Take 1 tablet by mouth daily as needed (cough aches).   omega-3 acid ethyl esters (LOVAZA) 1 G capsule Take by mouth in the morning, at noon, and at bedtime.   oxymetazoline (AFRIN) 0.05 % nasal spray Place 1 spray into both nostrils 2 (two) times daily.   rosuvastatin (CRESTOR) 40 MG tablet Take 40 mg by mouth daily.   sacubitril-valsartan (ENTRESTO) 24-26 MG Take 1 tablet by mouth 2 (two) times daily.   SERTRALINE HCL PO Take 150 mg by mouth daily.   spironolactone (ALDACTONE) 25 MG tablet Take 0.5 tablets (12.5 mg total) by mouth daily.   torsemide (DEMADEX) 20 MG tablet Take 40 mg by mouth 2 (two) times daily.   vitamin C (ASCORBIC ACID) 500 MG tablet Take 500 mg by mouth daily.   Zinc Methionate 50 MG CAPS Take 1 capsule by mouth daily.        Allergies:   Codeine    Social History         Socioeconomic History   Marital status: Married      Spouse name: [name]   Number of children: 3   Years of education: 14   Highest education level: Not on file  Occupational History      Comment: retired  Tobacco Use   Smoking status: Former      Packs/day: 2.50      Years: 50.00      Pack years: 125.00      Types: Cigarettes      Quit date: 06/17/2011      Years since quitting: 9.8   Smokeless tobacco: Never  Vaping Use   Vaping Use: Some days  Substance and Sexual Activity   Alcohol use: No       Alcohol/week: 0.0 standard drinks   Drug use: No   Sexual activity: Not on file  Other Topics Concern  Not on file  Social History Narrative    Consumes no caffeine    Social Determinants of Health    Financial Resource Strain: Not on file  Food Insecurity: Not on file  Transportation Needs: Not on file  Physical Activity: Not on file  Stress: Not on file  Social Connections: Not on file      Family History: The patient's family history includes Diabetes Mellitus II in his father and sister; Hypertension in his father and sister; Raynaud syndrome in his mother; Stroke in his brother and father.   ROS:   Please see the history of present illness.    All other systems reviewed and are negative.   EKGs/Labs/Other Studies Reviewed:     The following studies were reviewed today:   Recent hospitalization records  April 14, 2021 echo personally reviewed Left ventricular function moderately decreased, 35%-40 percent Global hypokinesis Right ventricular function mildly reduced Severely dilated left and right atrium Trivial MR     EKG:  The ekg ordered today demonstrates atrial fibrillation with a ventricular rate of 74 bpm.  Left bundle branch block.     Recent Labs: 04/14/2021: ALT 44; TSH 0.541 04/17/2021: B Natriuretic Peptide 382.0 04/18/2021: BUN 51; Creatinine, Ser 1.26; Hemoglobin 12.4; Magnesium 2.2; Platelets 181; Potassium 3.8; Sodium 138  Recent Lipid Panel Labs (Brief)  No results found for: CHOL, TRIG, HDL, CHOLHDL, VLDL, LDLCALC, LDLDIRECT     Physical Exam:     VS:  BP 110/62   Pulse 74   Ht $R'5\' 11"'QE$  (1.803 m)   Wt 288 lb 6.4 oz (130.8 kg)   SpO2 (!) 87%   BMI 40.22 kg/m         Wt Readings from Last 3 Encounters:  04/30/21 287 lb (130.2 kg)  04/30/21 288 lb 6.4 oz (130.8 kg)  04/19/21 283 lb (128.4 kg)      GEN: Obese male in moderate distress due to respiratory difficulty using cane HEENT: JVD to the angle of the jaw at 90 degrees NECK:  No carotid bruits LYMPHATICS: No lymphadenopathy CARDIAC: Irregularly irregular, no murmurs, rubs, gallops.  Tepid extremities.  2+ pitting bilateral lower extremity edema to the thighs.  Abdomen is tense/distended RESPIRATORY:  Clear to auscultation without rales, wheezing or rhonchi  ABDOMEN: non-tender,  MUSCULOSKELETAL: Edema as above; No deformity  SKIN: Warm and dry NEUROLOGIC:  Alert and oriented x 3 PSYCHIATRIC:  Normal affect          Assessment     ASSESSMENT:     1. Persistent atrial fibrillation ([place])   2. Acute decompensated heart failure ([place])   3. Coronary artery disease involving native coronary artery of native heart with angina pectoris ([place])     PLAN:     In order of problems listed above:   #Persistent atrial fibrillation Previously uncontrolled ventricular rates are likely contributing to his cardiomyopathy.  Rate control be very important for him.  I suspect rhythm control be difficult for him.  In the long run, he may benefit from AV junction ablation with CRT implant.  He is not currently a candidate for this given his tenuous respiratory status.  He also has wounds on his lower extremities that need to be addressed.  We can reevaluate his eligibility for the above procedures once he is hemodynamically stable and euvolemic. On Eliquis for stroke prophylaxis.  Continue this or heparin as inpatient.   #Acutely decompensated systolic heart failure NYHA class III-IV.  Tepid and wet on exam.  Last ejection fraction 35%.  His clinical status is tenuous today in clinic.  I am sending him to the hospital for direct admission.  He needs significant amount of IV diuresis.  May need further evaluation of his coronaries.   #Coronary artery disease No ischemic symptoms today.     Total time spent with patient today 65 minutes. This includes reviewing records, evaluating the patient and coordinating care.   Medication Adjustments/Labs and Tests Ordered: Current  medicines are reviewed at length with the patient today.  Concerns regarding medicines are outlined above.  No orders of the defined types were placed in this encounter.   No orders of the defined types were placed in this encounter.       Signed, Hilton Cork. Quentin Ore, MD, [facility], [facility] 04/30/2021 5:14 PM    Electrophysiology [facility]     I briefly saw Mr. Golson on 6 E.  He is in no acute distress at rest. He was admitted today from the office by Dr. Quentin Ore.  See his admission note above.  I know Mr. Crookshanks from recent hospitalization at [facility] where I rounded on him.  He was noted to be in decompensated heart failure at the time.  Myself and Dr. Audie Box had discussed proceeding with a AV node ablation and biventricular pacing strategy.  We had referred him to Dr. Quentin Ore for this evaluation.  His A. fib is likely permanent having failed ablation in the past and he does have a wide QRS of 150 ms.  We will proceed with IV diuresis but ultimately think he would benefit from CRT-D/P therapy.  Pixie Casino, MD, [facility], [place] Director of the Advanced Lipid Disorders &  Cardiovascular Risk Reduction Clinic Diplomate of the American Board of Clinical Lipidology Attending Cardiologist  Direct Dial: [phone]  Fax: [phone]  Website:  [email or web address]

## 2021-04-30 NOTE — Patient Instructions (Addendum)
You need to be admitted to the hospital. In an hour please go to main entrance of [facility] (entrance A), go to admitting

## 2021-04-30 NOTE — Progress Notes (Signed)
Electrophysiology Office Note:    Date:  04/30/2021   ID:  Tommy Riley, DOB 1946/06/11, MRN [id number]  PCP:  Clinton Quant, MD  [facility] Cardiologist:  Rozann Lesches, MD  [facility] Electrophysiologist:  Vickie Epley, MD   Referring MD: Clinton Quant, MD   Chief Complaint: Atrial fibrillation  History of Present Illness:    Tommy Riley is a 75 y.o. male who presents for an evaluation of atrial fibrillation at the request of Dr. Domenic Polite. Their medical history includes atrial fibrillation and flutter post ablation in 2011 at [facility], coronary artery disease, chronic systolic heart failure, CKD 3, COPD, hypertension and morbid obesity.  The patient was recently admitted to [facility] from April 13, 2021 through April 19, 2021 for acutely decompensated combined systolic and diastolic heart failure.  While he was admitted he was diuresed and felt better by the time of discharge although he felt like there was still some fluid on board.  Since going home he is slowly deteriorated and now his breathing is extremely limiting.  He has significant swelling in bilateral lower extremities and abdomen.  He tells me that his diuretic was changed to torsemide and he can tell that he urinates more while on torsemide but he knows that it is "not enough".  No syncope or presyncope.  He is sleeping poorly in the last few days.     Past Medical History:  Diagnosis Date   Anxiety    Atrial fibrillation and flutter ([place])    Atrial fibrillation ablation 2011 at [facility]   CAD in native artery    Chronic systolic heart failure (HCC)    CKD (chronic kidney disease), stage III (HCC)    COPD (chronic obstructive pulmonary disease) ([place])    COVID-22 February 2020   Depression    Essential hypertension    Gout    Hyperlipidemia    Ischemic heart disease    OSA (obstructive sleep apnea)    Stroke ([place]) 2003   Type 2 diabetes mellitus ([place])      Past Surgical History:  Procedure Laterality Date   ATRIAL FIBRILLATION ABLATION  2011   [facility]   CATARACT EXTRACTION Bilateral    CORONARY ARTERY BYPASS GRAFT  2001   HERNIA REPAIR Right    TONSILLECTOMY AND ADENOIDECTOMY      Current Medications: Current Meds  Medication Sig   ACCU-CHEK AVIVA PLUS test strip    albuterol (PROVENTIL) (2.5 MG/3ML) 0.083% nebulizer solution Take 3 mLs (2.5 mg total) by nebulization every 6 (six) hours as needed for wheezing or shortness of breath.   albuterol (VENTOLIN HFA) 108 (90 Base) MCG/ACT inhaler USE 2 INHALATIONS BY MOUTH  EVERY 6 HOURS AS NEEDED FOR WHEEZING OR SHORTNESS OF  BREATH   allopurinol (ZYLOPRIM) 100 MG tablet Take 2 tablets in the morning and 1 tablet in the evening   apixaban (ELIQUIS) 5 MG TABS tablet Take 1 tablet (5 mg total) by mouth 2 (two) times daily.   aspirin 81 MG tablet Take 81 mg by mouth daily.   b complex vitamins tablet Take 1 tablet by mouth daily.   Blood Glucose Monitoring Suppl (ONE TOUCH ULTRA 2) W/DEVICE KIT by Does not apply route.   Cholecalciferol (VITAMIN D3) 2000 UNITS TABS Take by mouth daily.   digoxin (LANOXIN) 0.125 MG tablet Take 1 tablet (0.125 mg total) by mouth daily.   diphenhydramine-acetaminophen (TYLENOL PM) 25-500 MG TABS tablet Take 2 tablets  every day by oral route.   ezetimibe (ZETIA) 10 MG tablet Take 10 mg by mouth daily.   FARXIGA 10 MG TABS tablet Take 1 tablet (10 mg total) by mouth daily.   Fluticasone-Umeclidin-Vilant (TRELEGY ELLIPTA) 100-62.5-25 MCG/INH AEPB Inhale 1 puff into the lungs daily. Patient needs 90 day script with 3 refills.   Fluticasone-Umeclidin-Vilant (TRELEGY ELLIPTA) 100-62.5-25 MCG/INH AEPB Inhale 1 puff into the lungs daily. Patient needs 90 day script with 3 refills.   folic acid (FOLVITE) 917 MCG tablet Take 400 mcg by mouth daily.   guaiFENesin (MUCINEX) 600 MG 12 hr tablet Take 1,200 mg by mouth 2 (two) times daily.   HUMALOG KWIKPEN 200 UNIT/ML KwikPen  Inject 20 Units into the skin every evening.   insulin detemir (LEVEMIR) 100 UNIT/ML injection Inject 30 Units into the skin daily.   loratadine (CLARITIN) 10 MG tablet Take 10 mg by mouth daily.   MELATONIN PO Take 6 mg by mouth daily.   metoprolol succinate (TOPROL-XL) 200 MG 24 hr tablet Take 1 tablet (200 mg total) by mouth daily. Take with or immediately following a meal.   Multiple Vitamins-Minerals (CENTRUM SILVER PO) Take 1 tablet by mouth daily.    Nutritional Supplements (COLD AND FLU PO) Take 1 tablet by mouth daily as needed (cough aches).   omega-3 acid ethyl esters (LOVAZA) 1 G capsule Take by mouth in the morning, at noon, and at bedtime.   oxymetazoline (AFRIN) 0.05 % nasal spray Place 1 spray into both nostrils 2 (two) times daily.   rosuvastatin (CRESTOR) 40 MG tablet Take 40 mg by mouth daily.   sacubitril-valsartan (ENTRESTO) 24-26 MG Take 1 tablet by mouth 2 (two) times daily.   SERTRALINE HCL PO Take 150 mg by mouth daily.   spironolactone (ALDACTONE) 25 MG tablet Take 0.5 tablets (12.5 mg total) by mouth daily.   torsemide (DEMADEX) 20 MG tablet Take 40 mg by mouth 2 (two) times daily.   vitamin C (ASCORBIC ACID) 500 MG tablet Take 500 mg by mouth daily.   Zinc Methionate 50 MG CAPS Take 1 capsule by mouth daily.     Allergies:   Codeine   Social History   Socioeconomic History   Marital status: Married    Spouse name: [name]   Number of children: 3   Years of education: 14   Highest education level: Not on file  Occupational History    Comment: retired  Tobacco Use   Smoking status: Former    Packs/day: 2.50    Years: 50.00    Pack years: 125.00    Types: Cigarettes    Quit date: 06/17/2011    Years since quitting: 9.8   Smokeless tobacco: Never  Vaping Use   Vaping Use: Some days  Substance and Sexual Activity   Alcohol use: No    Alcohol/week: 0.0 standard drinks   Drug use: No   Sexual activity: Not on file  Other Topics Concern   Not on file   Social History Narrative   Consumes no caffeine   Social Determinants of Health   Financial Resource Strain: Not on file  Food Insecurity: Not on file  Transportation Needs: Not on file  Physical Activity: Not on file  Stress: Not on file  Social Connections: Not on file     Family History: The patient's family history includes Diabetes Mellitus II in his father and sister; Hypertension in his father and sister; Raynaud syndrome in his mother; Stroke in his brother and father.  ROS:   Please see the history of present illness.    All other systems reviewed and are negative.  EKGs/Labs/Other Studies Reviewed:    The following studies were reviewed today:  Recent hospitalization records  April 14, 2021 echo personally reviewed Left ventricular function moderately decreased, 35%-40 percent Global hypokinesis Right ventricular function mildly reduced Severely dilated left and right atrium Trivial MR   EKG:  The ekg ordered today demonstrates atrial fibrillation with a ventricular rate of 74 bpm.  Left bundle branch block.   Recent Labs: 04/14/2021: ALT 44; TSH 0.541 04/17/2021: B Natriuretic Peptide 382.0 04/18/2021: BUN 51; Creatinine, Ser 1.26; Hemoglobin 12.4; Magnesium 2.2; Platelets 181; Potassium 3.8; Sodium 138  Recent Lipid Panel No results found for: CHOL, TRIG, HDL, CHOLHDL, VLDL, LDLCALC, LDLDIRECT  Physical Exam:    VS:  BP 110/62   Pulse 74   Ht $R'5\' 11"'bI$  (1.803 m)   Wt 288 lb 6.4 oz (130.8 kg)   SpO2 (!) 87%   BMI 40.22 kg/m     Wt Readings from Last 3 Encounters:  04/30/21 287 lb (130.2 kg)  04/30/21 288 lb 6.4 oz (130.8 kg)  04/19/21 283 lb (128.4 kg)     GEN: Obese male in moderate distress due to respiratory difficulty using cane HEENT: JVD to the angle of the jaw at 90 degrees NECK: No carotid bruits LYMPHATICS: No lymphadenopathy CARDIAC: Irregularly irregular, no murmurs, rubs, gallops.  Tepid extremities.  2+ pitting bilateral lower  extremity edema to the thighs.  Abdomen is tense/distended RESPIRATORY:  Clear to auscultation without rales, wheezing or rhonchi  ABDOMEN: non-tender,  MUSCULOSKELETAL: Edema as above; No deformity  SKIN: Warm and dry NEUROLOGIC:  Alert and oriented x 3 PSYCHIATRIC:  Normal affect       ASSESSMENT:    1. Persistent atrial fibrillation ([place])   2. Acute decompensated heart failure ([place])   3. Coronary artery disease involving native coronary artery of native heart with angina pectoris ([place])    PLAN:    In order of problems listed above:  #Persistent atrial fibrillation Previously uncontrolled ventricular rates are likely contributing to his cardiomyopathy.  Rate control be very important for him.  I suspect rhythm control be difficult for him.  In the long run, he may benefit from AV junction ablation with CRT implant.  He is not currently a candidate for this given his tenuous respiratory status.  He also has wounds on his lower extremities that need to be addressed.  We can reevaluate his eligibility for the above procedures once he is hemodynamically stable and euvolemic. On Eliquis for stroke prophylaxis.  Continue this or heparin as inpatient.  #Acutely decompensated systolic heart failure NYHA class III-IV.  Tepid and wet on exam.  Last ejection fraction 35%.  His clinical status is tenuous today in clinic.  I am sending him to the hospital for direct admission.  He needs significant amount of IV diuresis.  May need further evaluation of his coronaries.  #Coronary artery disease No ischemic symptoms today.   Total time spent with patient today 65 minutes. This includes reviewing records, evaluating the patient and coordinating care.  Medication Adjustments/Labs and Tests Ordered: Current medicines are reviewed at length with the patient today.  Concerns regarding medicines are outlined above.  No orders of the defined types were placed in this encounter.  No orders of the  defined types were placed in this encounter.    Signed, Hilton Cork. Quentin Ore, MD, [facility], [facility] 04/30/2021 5:14 PM  Electrophysiology [facility]

## 2021-05-01 ENCOUNTER — Encounter (HOSPITAL_COMMUNITY): Payer: Self-pay | Admitting: Internal Medicine

## 2021-05-01 DIAGNOSIS — E118 Type 2 diabetes mellitus with unspecified complications: Secondary | ICD-10-CM | POA: Diagnosis not present

## 2021-05-01 DIAGNOSIS — Z9989 Dependence on other enabling machines and devices: Secondary | ICD-10-CM

## 2021-05-01 DIAGNOSIS — J438 Other emphysema: Secondary | ICD-10-CM | POA: Diagnosis not present

## 2021-05-01 DIAGNOSIS — G4733 Obstructive sleep apnea (adult) (pediatric): Secondary | ICD-10-CM

## 2021-05-01 DIAGNOSIS — Z951 Presence of aortocoronary bypass graft: Secondary | ICD-10-CM

## 2021-05-01 DIAGNOSIS — I5023 Acute on chronic systolic (congestive) heart failure: Secondary | ICD-10-CM

## 2021-05-01 DIAGNOSIS — I4821 Permanent atrial fibrillation: Secondary | ICD-10-CM

## 2021-05-01 LAB — HEMOGLOBIN A1C
Hgb A1c MFr Bld: 6.8 % — ABNORMAL HIGH (ref 4.8–5.6)
Mean Plasma Glucose: 148.46 mg/dL

## 2021-05-01 LAB — RESP PANEL BY RT-PCR (FLU A&B, COVID) ARPGX2
Influenza A by PCR: NEGATIVE
Influenza B by PCR: NEGATIVE
SARS Coronavirus 2 by RT PCR: NEGATIVE

## 2021-05-01 LAB — COMPREHENSIVE METABOLIC PANEL
ALT: 43 U/L (ref 0–44)
AST: 41 U/L (ref 15–41)
Albumin: 3 g/dL — ABNORMAL LOW (ref 3.5–5.0)
Alkaline Phosphatase: 81 U/L (ref 38–126)
Anion gap: 7 (ref 5–15)
BUN: 21 mg/dL (ref 8–23)
CO2: 36 mmol/L — ABNORMAL HIGH (ref 22–32)
Calcium: 8.7 mg/dL — ABNORMAL LOW (ref 8.9–10.3)
Chloride: 98 mmol/L (ref 98–111)
Creatinine, Ser: 1.31 mg/dL — ABNORMAL HIGH (ref 0.61–1.24)
GFR, Estimated: 58 mL/min — ABNORMAL LOW (ref >60)
Glucose, Bld: 189 mg/dL — ABNORMAL HIGH (ref 70–99)
Potassium: 2.7 mmol/L — CL (ref 3.5–5.1)
Sodium: 141 mmol/L (ref 135–145)
Total Bilirubin: 1.8 mg/dL — ABNORMAL HIGH (ref 0.3–1.2)
Total Protein: 5.6 g/dL — ABNORMAL LOW (ref 6.5–8.1)

## 2021-05-01 LAB — GLUCOSE, CAPILLARY
Glucose-Capillary: 192 mg/dL — ABNORMAL HIGH (ref 70–99)
Glucose-Capillary: 164 mg/dL — ABNORMAL HIGH (ref 70–99)
Glucose-Capillary: 163 mg/dL — ABNORMAL HIGH (ref 70–99)
Glucose-Capillary: 187 mg/dL — ABNORMAL HIGH (ref 70–99)

## 2021-05-01 LAB — BRAIN NATRIURETIC PEPTIDE: B Natriuretic Peptide: 498.7 pg/mL — ABNORMAL HIGH (ref 0.0–100.0)

## 2021-05-01 LAB — BASIC METABOLIC PANEL
Anion gap: 8 (ref 5–15)
BUN: 22 mg/dL (ref 8–23)
CO2: 34 mmol/L — ABNORMAL HIGH (ref 22–32)
Calcium: 8.9 mg/dL (ref 8.9–10.3)
Chloride: 99 mmol/L (ref 98–111)
Creatinine, Ser: 1.29 mg/dL — ABNORMAL HIGH (ref 0.61–1.24)
GFR, Estimated: 59 mL/min — ABNORMAL LOW (ref >60)
Glucose, Bld: 170 mg/dL — ABNORMAL HIGH (ref 70–99)
Potassium: 3.3 mmol/L — ABNORMAL LOW (ref 3.5–5.1)
Sodium: 141 mmol/L (ref 135–145)

## 2021-05-01 LAB — DIGOXIN LEVEL: Digoxin Level: 0.6 ng/mL — ABNORMAL LOW (ref 0.8–2.0)

## 2021-05-01 LAB — MAGNESIUM: Magnesium: 1.9 mg/dL (ref 1.7–2.4)

## 2021-05-01 MED ORDER — FUROSEMIDE 10 MG/ML IJ SOLN
80.0000 mg | Freq: Three times a day (TID) | INTRAMUSCULAR | Status: DC
  Administered 2021-05-01 – 2021-05-09 (×21): 80 mg via INTRAVENOUS
  Filled 2021-05-01 (×22): qty 8

## 2021-05-01 MED ORDER — POTASSIUM CHLORIDE CRYS ER 20 MEQ PO TBCR
40.0000 meq | EXTENDED_RELEASE_TABLET | ORAL | Status: AC
Start: 2021-05-01 — End: 2021-05-01
  Administered 2021-05-01 (×2): 40 meq via ORAL
  Filled 2021-05-01 (×2): qty 2

## 2021-05-01 MED ORDER — ALBUTEROL SULFATE (2.5 MG/3ML) 0.083% IN NEBU
2.5000 mg | INHALATION_SOLUTION | Freq: Three times a day (TID) | RESPIRATORY_TRACT | Status: DC
  Administered 2021-05-01 – 2021-05-02 (×3): 2.5 mg via RESPIRATORY_TRACT
  Filled 2021-05-01 (×3): qty 3

## 2021-05-01 MED ORDER — POTASSIUM CHLORIDE CRYS ER 10 MEQ PO TBCR
20.0000 meq | EXTENDED_RELEASE_TABLET | Freq: Every day | ORAL | Status: DC
  Administered 2021-05-01: 20 meq via ORAL
  Filled 2021-05-01: qty 2

## 2021-05-01 MED ORDER — POTASSIUM CHLORIDE 10 MEQ/100ML IV SOLN
10.0000 meq | INTRAVENOUS | Status: AC
  Administered 2021-05-01 (×2): 10 meq via INTRAVENOUS
  Filled 2021-05-01 (×2): qty 100

## 2021-05-01 MED ORDER — INSULIN ASPART 100 UNIT/ML IJ SOLN
15.0000 [IU] | Freq: Every evening | INTRAMUSCULAR | Status: DC
  Administered 2021-05-02 – 2021-05-04 (×3): 15 [IU] via SUBCUTANEOUS

## 2021-05-01 MED ORDER — ENSURE ENLIVE PO LIQD
237.0000 mL | Freq: Two times a day (BID) | ORAL | Status: DC
  Administered 2021-05-01 – 2021-05-04 (×7): 237 mL via ORAL

## 2021-05-01 NOTE — Progress Notes (Signed)
Pt was scheduled for 20 Units of Novolog with evening medications, CBG resulted at 162. Pt stated that he takes up to 20 Units in the evening at home and not the full dose. Humphrey Rolls, MD made aware and instructed this RN not to give evening dose of Novolog insulin. Will continue to monitor.   Elaina Hoops, RN

## 2021-05-01 NOTE — Progress Notes (Signed)
Pt has home unit says he is able to put it on when he is ready and will call for help when needed.

## 2021-05-01 NOTE — Progress Notes (Signed)
Mobility Specialist Progress Note:   05/01/21 1152  Mobility  Activity Ambulated in hall  Level of Assistance Standby assist, set-up cues, supervision of patient - no hands on  Assistive Device Cane  Distance Ambulated (ft) 80 ft  Mobility Ambulated with assistance in hallway  Mobility Response Tolerated well  Mobility performed by Mobility specialist  $Mobility charge 1 Mobility   SATURATION QUALIFICATIONS: (This note is used to comply with regulatory documentation for home oxygen)  Patient Saturations on [place] at Rest = 93%  Patient Saturations on [place] while Ambulating = 87%  Patient Saturations on 2L Liters of oxygen while Ambulating = 91%  Pt received EOB willing to participate in mobility. No complaints of pain. Complaints of being SOB. Required 2L for SpO2 to remain above 91%. Pt returned to EOB with call bell in reach and all needs met.   [facility] Health and safety inspector Phone [phone]

## 2021-05-01 NOTE — Progress Notes (Signed)
Critical value for potassium of 2.7, this RN was notified at 0445 on 10/27; Humphrey Rolls, MD made aware. Awaiting new orders, will continue to monitor.   Elaina Hoops, RN

## 2021-05-01 NOTE — Progress Notes (Signed)
Progress Note  Patient Name: Tommy Riley Date of Encounter: 05/01/2021  [facility] Cardiologist: Rozann Lesches, MD   Subjective   No chest pain SOB improved but edema and abd fullness continues  Inpatient Medications    Scheduled Meds:  albuterol  2.5 mg Nebulization TID   apixaban  5 mg Oral BID   aspirin  81 mg Oral Daily   dapagliflozin propanediol  10 mg Oral Daily   digoxin  0.125 mg Oral Daily   ezetimibe  10 mg Oral Daily   fluticasone  1 spray Each Nare QHS   fluticasone furoate-vilanterol  1 puff Inhalation Daily   And   umeclidinium bromide  1 puff Inhalation Daily   folic acid  710 mcg Oral Daily   furosemide  80 mg Intravenous BID   guaiFENesin  1,200 mg Oral BID   insulin aspart  0-20 Units Subcutaneous TID WC   insulin aspart  20 Units Subcutaneous QPM   insulin detemir  30 Units Subcutaneous QHS   loratadine  10 mg Oral Daily   metoprolol  200 mg Oral Daily   omega-3 acid ethyl esters  2 g Oral BID   potassium chloride  20 mEq Oral Daily   potassium chloride  40 mEq Oral Q4H   rosuvastatin  40 mg Oral QHS   sacubitril-valsartan  1 tablet Oral BID   sertraline  150 mg Oral Daily   sodium chloride flush  3 mL Intravenous Q12H   spironolactone  12.5 mg Oral Daily   Continuous Infusions:  sodium chloride     PRN Meds: sodium chloride, acetaminophen, albuterol, ondansetron (ZOFRAN) IV, sodium chloride flush, zolpidem   Vital Signs    Vitals:   05/01/21 0036 05/01/21 0358 05/01/21 0716 05/01/21 0810  BP: (!) 120/54 133/77 133/80   Pulse: 81 78 68   Resp: 20 20 19    Temp: 98.7 F (37.1 C) 98.2 F (36.8 C) 98.3 F (36.8 C)   TempSrc: Oral Oral Oral   SpO2: 97% 99% 97% 98%  Weight:  129.9 kg    Height:        Intake/Output Summary (Last 24 hours) at 05/01/2021 0811 Last data filed at 05/01/2021 0603 Gross per 24 hour  Intake 897.1 ml  Output 950 ml  Net -52.9 ml   Last 3 Weights 05/01/2021 04/30/2021 04/30/2021  Weight (lbs)  286 lb 4.8 oz 287 lb 288 lb 6.4 oz  Weight (kg) 129.865 kg 130.182 kg 130.817 kg      Telemetry    Atrial fib rate improved - Personally Reviewed  ECG    Atrial fib with improved rate from 04/30/21 RAD and LBBB - Personally Reviewed  Physical Exam   GEN: No acute distress.   Neck: No JVD sitting up Cardiac: IRRREG, IRREG , no murmurs, rubs, or gallops.  Respiratory: Clear to diminished to auscultation bilaterally. GI: Soft, nontender, non-distended abd feels larger  MS: 3+ edema; with support stockings edema now in back of thighsNo deformity. Neuro:  Nonfocal  Psych: Normal affect   Labs    High Sensitivity Troponin:   Recent Labs  Lab 04/13/21 1724 04/13/21 1933  TROPONINIHS 127* 118*     Chemistry Recent Labs  Lab 05/01/21 0304  NA 141  K 2.7*  CL 98  CO2 36*  GLUCOSE 189*  BUN 21  CREATININE 1.31*  CALCIUM 8.7*  MG 1.9  PROT 5.6*  ALBUMIN 3.0*  AST 41  ALT 43  ALKPHOS 81  BILITOT  1.8*  GFRNONAA 58*  ANIONGAP 7    Lipids No results for input(s): CHOL, TRIG, HDL, LABVLDL, LDLCALC, CHOLHDL in the last 168 hours.  HematologyNo results for input(s): WBC, RBC, HGB, HCT, MCV, MCH, MCHC, RDW, PLT in the last 168 hours. Thyroid No results for input(s): TSH, FREET4 in the last 168 hours.  BNP Recent Labs  Lab 05/01/21 0304  BNP 498.7*    DDimer No results for input(s): DDIMER in the last 168 hours.   Radiology    No results found.  Cardiac Studies   Echo 04/14/21 TTE IMPRESSIONS     1. Left ventricular ejection fraction, by estimation, is 35 to 40%. The  left ventricle has moderately decreased function. The left ventricle  demonstrates global hypokinesis. There is mild left ventricular  hypertrophy. Left ventricular diastolic  parameters are indeterminate. In Afib with RVR during study, consider  repeat limited echo to better evaluate systolic function once rates better  controlled.   2. Right ventricular systolic function is mildly reduced.  The right  ventricular size is normal. There is severely elevated pulmonary artery  systolic pressure. The estimated right ventricular systolic pressure is  69.6 mmHg.   3. Left atrial size was severely dilated.   4. Right atrial size was severely dilated.   5. The mitral valve is normal in structure. Trivial mitral valve  regurgitation.   6. The aortic valve is tricuspid. Aortic valve regurgitation is not  visualized. No aortic stenosis is present.   7. The inferior vena cava is dilated in size with <50% respiratory  variability, suggesting right atrial pressure of 15 mmHg.   FINDINGS   Left Ventricle: Left ventricular ejection fraction, by estimation, is 35  to 40%. The left ventricle has moderately decreased function. The left  ventricle demonstrates global hypokinesis. Definity contrast agent was  given IV to delineate the left  ventricular endocardial borders. The left ventricular internal cavity size  was normal in size. There is mild left ventricular hypertrophy. Left  ventricular diastolic parameters are indeterminate.   Right Ventricle: The right ventricular size is normal. Right vetricular  wall thickness was not well visualized. Right ventricular systolic  function is mildly reduced. There is severely elevated pulmonary artery  systolic pressure. The tricuspid  regurgitant velocity is 3.57 m/s, and with an assumed right atrial  pressure of 15 mmHg, the estimated right ventricular systolic pressure is  29.5 mmHg.   Left Atrium: Left atrial size was severely dilated.   Right Atrium: Right atrial size was severely dilated.   Pericardium: There is no evidence of pericardial effusion.   Mitral Valve: The mitral valve is normal in structure. Trivial mitral  valve regurgitation. MV peak gradient, 8.2 mmHg. The mean mitral valve  gradient is 4.0 mmHg.   Tricuspid Valve: The tricuspid valve is normal in structure. Tricuspid  valve regurgitation is trivial.   Aortic Valve:  The aortic valve is tricuspid. Aortic valve regurgitation is  not visualized. No aortic stenosis is present. Aortic valve mean gradient  measures 3.7 mmHg. Aortic valve peak gradient measures 7.9 mmHg. Aortic  valve area, by VTI measures 2.55  cm.   Pulmonic Valve: The pulmonic valve was not well visualized. Pulmonic valve  regurgitation is not visualized.   Aorta: The aortic root is normal in size and structure.   Venous: The inferior vena cava is dilated in size with less than 50%  respiratory variability, suggesting right atrial pressure of 15 mmHg.   IAS/Shunts: The interatrial septum was  not well visualized.      LEFT VENTRICLE  PLAX 2D  LVIDd:         5.40 cm   Diastology  LVIDs:         4.55 cm   LV e' medial:    9.00 cm/s  LV PW:         1.30 cm   LV E/e' medial:  14.7  LV IVS:        1.30 cm   LV e' lateral:   13.00 cm/s  LVOT diam:     2.00 cm   LV E/e' lateral: 10.2  LV SV:         52  LV SV Index:   21  LVOT Area:     3.14 cm      Patient Profile     75 y.o. male PMH atrial fibrillation and flutter post ablation in 2011 at [facility], coronary artery disease, chronic systolic heart failure, CKD 3, DM-2, COPD, hypertension and morbid obesity. Recent acute systolic and diastolic CHF- admitted from OV with Dr. Quentin Ore with acute CHF.  He felt he was urinating more on torsemide.  His A. fib is likely permanent having failed ablation in the past and he does have a wide QRS of 150 ms.    Assessment & Plan    Acute systolic and diastolic CHF/ ICM  -- recent EF 35-40%, mild LVH,   There is severely elevated pulmonary artery systolic pressure.  --since admit neg 52 ml wt 286.44 lbs on admit and now 285.78 lbs on lasix 80 mg BID has rec'd one dose. Continue to diuresis --BNP 498 Dig level 0.6   Persistent atrial fib - rate controlled currently but RVR on admit.  Per Dr. Quentin Ore " In the long run, he may benefit from AV junction ablation with CRT implant.  He is not  currently a candidate for this given his tenuous respiratory status.  He also has wounds on his lower extremities that need to be addressed.  We can reevaluate his eligibility for the above procedures once he is hemodynamically stable and euvolemic. On Eliquis for stroke prophylaxis.  Continue this or heparin as inpatient."  CAD hx of CABG - no angina on ASA, statin, and zetia  Hypokalemia with replacing   CKD 2-3 stable  today Cr 1.31   OSA on CPAP  DM-2 on insulin A1C of 6.8  levemir now 12 units BID and SSI  glucose controlled for now.   For questions or updates, please contact [place] Please consult [email or web address] for contact info under        Signed, Cecilie Kicks, NP  05/01/2021, 8:11 AM

## 2021-05-01 NOTE — Progress Notes (Signed)
Heart Failure Nurse Navigator Progress Note  PCP: Pomposini, [name], MD PCP-Cardiologist: Myles Gip., MD Admission Diagnosis: CHF exacerbation Admitted from: direct admit from EP cards office  Presentation:   Tommy Riley presented 10/26 from Dr. Mardene Speak office for volume overload and CHF exacerbation. Pt recently discharged from [place], resides in [place], [place] with his spouse. Pt states he cannot continue down this path. Reviewed daily diet, pt can make improvements upon fluid and sodium intake. Pt drinks 2L of diet ginger ale + liquids with meals daily. High sodium snacks (chips, crackers, bacon) daily. Plan for HV TOC upon DC from hospital.  ECHO/ LVEF: 04/14/2021: EF 35-40%, global hypokinesis. Severely dilated LA and RA.   Clinical Course:  Past Medical History:  Diagnosis Date   Anxiety    Atrial fibrillation and flutter (HCC)    Atrial fibrillation ablation 2011 at [facility]   CAD in native artery    Chronic systolic heart failure (HCC)    CKD (chronic kidney disease), stage III (HCC)    COPD (chronic obstructive pulmonary disease) ([place])    COVID-22 February 2020   Depression    Essential hypertension    Gout    Hyperlipidemia    Ischemic heart disease    OSA (obstructive sleep apnea)    Stroke ([place]) 2003   Type 2 diabetes mellitus ([place])      Social History   Socioeconomic History   Marital status: Married    Spouse name: [name]   Number of children: 3   Years of education: 14   Highest education level: Not on file  Occupational History    Comment: retired  Tobacco Use   Smoking status: Former    Packs/day: 2.50    Years: 50.00    Pack years: 125.00    Types: Cigarettes    Quit date: 06/17/2011    Years since quitting: 9.8   Smokeless tobacco: Never  Vaping Use   Vaping Use: Some days  Substance and Sexual Activity   Alcohol use: No    Comment: 1 beer a month   Drug use: No   Sexual activity: Not on file  Other Topics Concern   Not on  file  Social History Narrative   Consumes no caffeine   Social Determinants of Health   Financial Resource Strain: Not on file  Food Insecurity: Not on file  Transportation Needs: Not on file  Physical Activity: Not on file  Stress: Not on file  Social Connections: Not on file    High Risk Criteria for Readmission and/or Poor Patient Outcomes: Heart failure hospital admissions (last 6 months): 2  No Show rate: 0% Difficult social situation: no Demonstrates medication adherence: yes Primary Language: English Literacy level: able to read/write and comprehend. Wears glasses.  Education Assessment and Provision:  Detailed education and instructions provided on heart failure disease management including the following:  Signs and symptoms of Heart Failure When to call the physician Importance of daily weights Low sodium diet Fluid restriction Medication management Anticipated future follow-up appointments  Patient education given on each of the above topics.  Patient acknowledges understanding via teach back method and acceptance of all instructions.  Education Materials:  "Living Better With Heart Failure" Booklet, HF zone tool, & Daily Weight Tracker Tool.  Patient has scale at home: yes Patient has pill box at home: yes   Barriers of Care:   -dietary indiscretion  Considerations/Referrals:   Referral made to Heart Failure Pharmacist Stewardship: yes,  appreciated Referral made to Heart Failure CSW/NCM TOC: yes, appreciated Referral made to Heart & Vascular TOC clinic: yes, 11/7 @ 11AM  Items for Follow-up on DC/TOC: -optimize -dietary modifications   Pricilla Holm, MSN, RN Heart Failure Nurse Navigator [phone]

## 2021-05-01 NOTE — Consult Note (Addendum)
[facility] Nurse Consult Note: Reason for Consult: Consult requested for bilat legs.  Pt has his own compression stockings from home which he is wearing to reduce chronic edema.  Removed to assess skin; there are no open wounds or drainage.  Pt has significant pink dry intact scar tissue to right calf from previous wound which healed.  Left area above knee with 2 black dry scabs which are intact without drainage or fluctuance; .3X.3cm and 1X1 cm.  These will eventually loosen and fall off; no topical treatment is indicated at this time. Dressing procedure/placement/frequency: Topical treatment orders provided for bedside nurses to perform as follows: Pt to wear his own compression stockings, remove Q shift to assess skin then re-apply. Please re-consult if further assistance is needed.  Thank-you,  Julien Girt MSN, [place], [place], [place], [place]

## 2021-05-02 DIAGNOSIS — I5023 Acute on chronic systolic (congestive) heart failure: Secondary | ICD-10-CM | POA: Diagnosis not present

## 2021-05-02 DIAGNOSIS — G4733 Obstructive sleep apnea (adult) (pediatric): Secondary | ICD-10-CM | POA: Diagnosis not present

## 2021-05-02 DIAGNOSIS — E118 Type 2 diabetes mellitus with unspecified complications: Secondary | ICD-10-CM | POA: Diagnosis not present

## 2021-05-02 DIAGNOSIS — I4821 Permanent atrial fibrillation: Secondary | ICD-10-CM | POA: Diagnosis not present

## 2021-05-02 LAB — GLUCOSE, CAPILLARY
Glucose-Capillary: 143 mg/dL — ABNORMAL HIGH (ref 70–99)
Glucose-Capillary: 186 mg/dL — ABNORMAL HIGH (ref 70–99)
Glucose-Capillary: 202 mg/dL — ABNORMAL HIGH (ref 70–99)
Glucose-Capillary: 163 mg/dL — ABNORMAL HIGH (ref 70–99)

## 2021-05-02 LAB — BASIC METABOLIC PANEL
Anion gap: 7 (ref 5–15)
BUN: 24 mg/dL — ABNORMAL HIGH (ref 8–23)
CO2: 36 mmol/L — ABNORMAL HIGH (ref 22–32)
Calcium: 9 mg/dL (ref 8.9–10.3)
Chloride: 100 mmol/L (ref 98–111)
Creatinine, Ser: 1.32 mg/dL — ABNORMAL HIGH (ref 0.61–1.24)
GFR, Estimated: 57 mL/min — ABNORMAL LOW (ref >60)
Glucose, Bld: 166 mg/dL — ABNORMAL HIGH (ref 70–99)
Potassium: 3.2 mmol/L — ABNORMAL LOW (ref 3.5–5.1)
Sodium: 143 mmol/L (ref 135–145)

## 2021-05-02 MED ORDER — POTASSIUM CHLORIDE CRYS ER 20 MEQ PO TBCR
40.0000 meq | EXTENDED_RELEASE_TABLET | Freq: Once | ORAL | Status: AC
  Administered 2021-05-02: 40 meq via ORAL
  Filled 2021-05-02: qty 2

## 2021-05-02 MED ORDER — ALBUTEROL SULFATE (2.5 MG/3ML) 0.083% IN NEBU
2.5000 mg | INHALATION_SOLUTION | Freq: Two times a day (BID) | RESPIRATORY_TRACT | Status: DC
  Administered 2021-05-03 – 2021-05-09 (×12): 2.5 mg via RESPIRATORY_TRACT
  Filled 2021-05-02 (×14): qty 3

## 2021-05-02 MED ORDER — POTASSIUM CHLORIDE CRYS ER 20 MEQ PO TBCR: 40.0000 meq | EXTENDED_RELEASE_TABLET | Freq: Every day | ORAL | Status: DC

## 2021-05-02 MED ORDER — POTASSIUM CHLORIDE CRYS ER 20 MEQ PO TBCR
40.0000 meq | EXTENDED_RELEASE_TABLET | Freq: Every day | ORAL | Status: DC
  Administered 2021-05-03 – 2021-05-04 (×2): 40 meq via ORAL
  Filled 2021-05-02 (×2): qty 2

## 2021-05-02 NOTE — Discharge Instructions (Addendum)
Information on my medicine - ELIQUIS (apixaban)   Why was Eliquis prescribed for you? Eliquis was prescribed for you to reduce the risk of a blood clot forming that can cause a stroke if you have a medical condition called atrial fibrillation (a type of irregular heartbeat).  What do You need to know about Eliquis ? Take your Eliquis TWICE DAILY - one tablet in the morning and one tablet in the evening with or without food. If you have difficulty swallowing the tablet whole please discuss with your pharmacist how to take the medication safely.  Take Eliquis exactly as prescribed by your doctor and DO NOT stop taking Eliquis without talking to the doctor who prescribed the medication.  Stopping may increase your risk of developing a stroke.  Refill your prescription before you run out.  After discharge, you should have regular check-up appointments with your healthcare provider that is prescribing your Eliquis.  In the future your dose may need to be changed if your kidney function or weight changes by a significant amount or as you get older.  What do you do if you miss a dose? If you miss a dose, take it as soon as you remember on the same day and resume taking twice daily.  Do not take more than one dose of ELIQUIS at the same time to make up a missed dose.  Important Safety Information A possible side effect of Eliquis is bleeding. You should call your healthcare provider right away if you experience any of the following: Bleeding from an injury or your nose that does not stop. Unusual colored urine (red or dark brown) or unusual colored stools (red or black). Unusual bruising for unknown reasons. A serious fall or if you hit your head (even if there is no bleeding).  Some medicines may interact with Eliquis and might increase your risk of bleeding or clotting while on Eliquis. To help avoid this, consult your healthcare provider or pharmacist prior to using any new prescription  or non-prescription medications, including herbals, vitamins, non-steroidal anti-inflammatory drugs (NSAIDs) and supplements.  This website has more information on Eliquis (apixaban): http://www.eliquis.com/eliquis/home  After Your ICD (Implantable Cardiac Defibrillator)   You have a Chemical engineer ICD  ACTIVITY Do not lift your arm above shoulder height for 1 week after your procedure. After 7 days, you may progress as below.  You should remove your sling 24 hours after your procedure, unless otherwise instructed by your provider.     Thursday May 15, 2021  Friday May 16, 2021 Saturday May 17, 2021 Sunday May 18, 2021   Do not lift, push, pull, or carry anything over 10 pounds with the affected arm until 6 weeks (Thursday June 19, 2021 ) after your procedure.   You may drive AFTER your wound check, unless you have been told otherwise by your provider.   Ask your healthcare provider when you can go back to work   INCISION/Dressing You may start your Eliquis back Saturday, 11/5 starting with the morning dose.   If large square, outer bandage is left in place, this can be removed after 24 hours from your procedure. Do not remove steri-strips or glue as below.   Monitor your defibrillator site for redness, swelling, and drainage. Call the device clinic at [phone] if you experience these symptoms or fever/chills.  If your incision is sealed with Steri-strips or staples, you may shower 10 days after your procedure or when told by your provider. Do not remove the steri-strips  or let the shower hit directly on your site. You may wash around your site with soap and water.    If you were discharged in a sling, please do not wear this during the day more than 48 hours after your surgery unless otherwise instructed. This may increase the risk of stiffness and soreness in your shoulder.   Avoid lotions, ointments, or perfumes over your incision until it is  well-healed.  You may use a hot tub or a pool AFTER your wound check appointment if the incision is completely closed.  Your ICD is designed to protect you from life threatening heart rhythms. Because of this, you may receive a shock.   1 shock with no symptoms:  Call the office during business hours. 1 shock with symptoms (chest pain, chest pressure, dizziness, lightheadedness, shortness of breath, overall feeling unwell):  Call 911. If you experience 2 or more shocks in 24 hours:  Call 911. If you receive a shock, you should not drive for 6 months per the [place] DMV IF you receive appropriate therapy from your ICD.   ICD Alerts:  Some alerts are vibratory and others beep. These are NOT emergencies. Please call our office to let us know. If this occurs at night or on weekends, it can wait until the next business day. Send a remote transmission.  If your device is capable of reading fluid status (for heart failure), you will be offered monthly monitoring to review this with you.   DEVICE MANAGEMENT Remote monitoring is used to monitor your ICD from home. This monitoring is scheduled every 91 days by our office. It allows [place] to keep an eye on the functioning of your device to ensure it is working properly. You will routinely see your Electrophysiologist annually (more often if necessary).   You should receive your ID card for your new device in 4-8 weeks. Keep this card with you at all times once received. Consider wearing a medical alert bracelet or necklace.  Your ICD  may be MRI compatible. This will be discussed at your next office visit/wound check.  You should avoid contact with strong electric or magnetic fields.   Do not use amateur (ham) radio equipment or electric (arc) welding torches. MP3 player headphones with magnets should not be used. Some devices are safe to use if held at least 12 inches (30 cm) from your defibrillator. These include power tools, lawn mowers, and speakers. If you  are unsure if something is safe to use, ask your health care provider.  When using your cell phone, hold it to the ear that is on the opposite side from the defibrillator. Do not leave your cell phone in a pocket over the defibrillator.  You may safely use electric blankets, heating pads, computers, and microwave ovens.  Call the office right away if: You have chest pain. You feel more than one shock. You feel more short of breath than you have felt before. You feel more light-headed than you have felt before. Your incision starts to open up.  This information is not intended to replace advice given to you by your health care provider. Make sure you discuss any questions you have with your health care provider.

## 2021-05-02 NOTE — Progress Notes (Signed)
Progress Note  Patient Name: Tommy Riley Date of Encounter: 05/02/2021  [facility] Cardiologist: Rozann Lesches, MD   Subjective   I am feeling better.  SOB is improving. No chest pain.  Inpatient Medications    Scheduled Meds:  albuterol  2.5 mg Nebulization TID   apixaban  5 mg Oral BID   aspirin  81 mg Oral Daily   dapagliflozin propanediol  10 mg Oral Daily   digoxin  0.125 mg Oral Daily   ezetimibe  10 mg Oral Daily   feeding supplement  237 mL Oral BID BM   fluticasone  1 spray Each Nare QHS   fluticasone furoate-vilanterol  1 puff Inhalation Daily   And   umeclidinium bromide  1 puff Inhalation Daily   folic acid  700 mcg Oral Daily   furosemide  80 mg Intravenous TID with meals   guaiFENesin  1,200 mg Oral BID   insulin aspart  0-20 Units Subcutaneous TID WC   insulin aspart  15 Units Subcutaneous QPM   insulin detemir  30 Units Subcutaneous QHS   loratadine  10 mg Oral Daily   metoprolol  200 mg Oral Daily   omega-3 acid ethyl esters  2 g Oral BID   potassium chloride  20 mEq Oral Daily   rosuvastatin  40 mg Oral QHS   sacubitril-valsartan  1 tablet Oral BID   sertraline  150 mg Oral Daily   sodium chloride flush  3 mL Intravenous Q12H   spironolactone  12.5 mg Oral Daily   Continuous Infusions:  sodium chloride     PRN Meds: sodium chloride, acetaminophen, albuterol, ondansetron (ZOFRAN) IV, sodium chloride flush, zolpidem   Vital Signs    Vitals:   05/02/21 0011 05/02/21 0446 05/02/21 0735 05/02/21 0737  BP: 128/65 132/69    Pulse: 82 83    Resp: 19 20    Temp: 98.1 F (36.7 C) (!) 97.5 F (36.4 C)    TempSrc: Axillary Oral    SpO2: 98% 94% 97% 97%  Weight:  129.1 kg    Height:        Intake/Output Summary (Last 24 hours) at 05/02/2021 0759 Last data filed at 05/02/2021 0450 Gross per 24 hour  Intake 1635.98 ml  Output 3250 ml  Net -1614.02 ml   Last 3 Weights 05/02/2021 05/01/2021 04/30/2021  Weight (lbs) 284 lb 9.6 oz  286 lb 4.8 oz 287 lb  Weight (kg) 129.094 kg 129.865 kg 130.182 kg      Telemetry    Atrial fib mostly rate controlled though with freq PVCs. (K+ is low) - Personally Reviewed  ECG    No new - Personally Reviewed  Physical Exam   GEN: No acute distress.  (Head with wounds due to skin cancer, havinr removed at different times.) Neck: No JVD Cardiac: irreg irreg, no murmurs, rubs, or gallops.  Respiratory: Clear to diminished to auscultation bilaterally. GI: Soft, nontender, non-distended  MS: thigh edema improved but continues to be present.  Lower ext with support stockings.  No deformity. Neuro:  Nonfocal  Psych: Normal affect   Labs    High Sensitivity Troponin:   Recent Labs  Lab 04/13/21 1724 04/13/21 1933  TROPONINIHS 127* 118*     Chemistry Recent Labs  Lab 05/01/21 0304 05/01/21 1336 05/02/21 0349  NA 141 141 143  K 2.7* 3.3* 3.2*  CL 98 99 100  CO2 36* 34* 36*  GLUCOSE 189* 170* 166*  BUN 21 22 24*  CREATININE 1.31* 1.29* 1.32*  CALCIUM 8.7* 8.9 9.0  MG 1.9  --   --   PROT 5.6*  --   --   ALBUMIN 3.0*  --   --   AST 41  --   --   ALT 43  --   --   ALKPHOS 81  --   --   BILITOT 1.8*  --   --   GFRNONAA 58* 59* 57*  ANIONGAP 7 8 7     Lipids No results for input(s): CHOL, TRIG, HDL, LABVLDL, LDLCALC, CHOLHDL in the last 168 hours.  HematologyNo results for input(s): WBC, RBC, HGB, HCT, MCV, MCH, MCHC, RDW, PLT in the last 168 hours. Thyroid No results for input(s): TSH, FREET4 in the last 168 hours.  BNP Recent Labs  Lab 05/01/21 0304  BNP 498.7*    DDimer No results for input(s): DDIMER in the last 168 hours.   Radiology    No results found.  Cardiac Studies   Echo 04/14/21 TTE IMPRESSIONS     1. Left ventricular ejection fraction, by estimation, is 35 to 40%. The  left ventricle has moderately decreased function. The left ventricle  demonstrates global hypokinesis. There is mild left ventricular  hypertrophy. Left ventricular  diastolic  parameters are indeterminate. In Afib with RVR during study, consider  repeat limited echo to better evaluate systolic function once rates better  controlled.   2. Right ventricular systolic function is mildly reduced. The right  ventricular size is normal. There is severely elevated pulmonary artery  systolic pressure. The estimated right ventricular systolic pressure is  76.1 mmHg.   3. Left atrial size was severely dilated.   4. Right atrial size was severely dilated.   5. The mitral valve is normal in structure. Trivial mitral valve  regurgitation.   6. The aortic valve is tricuspid. Aortic valve regurgitation is not  visualized. No aortic stenosis is present.   7. The inferior vena cava is dilated in size with <50% respiratory  variability, suggesting right atrial pressure of 15 mmHg.   FINDINGS   Left Ventricle: Left ventricular ejection fraction, by estimation, is 35  to 40%. The left ventricle has moderately decreased function. The left  ventricle demonstrates global hypokinesis. Definity contrast agent was  given IV to delineate the left  ventricular endocardial borders. The left ventricular internal cavity size  was normal in size. There is mild left ventricular hypertrophy. Left  ventricular diastolic parameters are indeterminate.   Right Ventricle: The right ventricular size is normal. Right vetricular  wall thickness was not well visualized. Right ventricular systolic  function is mildly reduced. There is severely elevated pulmonary artery  systolic pressure. The tricuspid  regurgitant velocity is 3.57 m/s, and with an assumed right atrial  pressure of 15 mmHg, the estimated right ventricular systolic pressure is  95.0 mmHg.   Left Atrium: Left atrial size was severely dilated.   Right Atrium: Right atrial size was severely dilated.   Pericardium: There is no evidence of pericardial effusion.   Mitral Valve: The mitral valve is normal in structure.  Trivial mitral  valve regurgitation. MV peak gradient, 8.2 mmHg. The mean mitral valve  gradient is 4.0 mmHg.   Tricuspid Valve: The tricuspid valve is normal in structure. Tricuspid  valve regurgitation is trivial.   Aortic Valve: The aortic valve is tricuspid. Aortic valve regurgitation is  not visualized. No aortic stenosis is present. Aortic valve mean gradient  measures 3.7 mmHg. Aortic valve peak gradient  measures 7.9 mmHg. Aortic  valve area, by VTI measures 2.55  cm.   Pulmonic Valve: The pulmonic valve was not well visualized. Pulmonic valve  regurgitation is not visualized.   Aorta: The aortic root is normal in size and structure.   Venous: The inferior vena cava is dilated in size with less than 50%  respiratory variability, suggesting right atrial pressure of 15 mmHg.   IAS/Shunts: The interatrial septum was not well visualized.      LEFT VENTRICLE  PLAX 2D  LVIDd:         5.40 cm   Diastology  LVIDs:         4.55 cm   LV e' medial:    9.00 cm/s  LV PW:         1.30 cm   LV E/e' medial:  14.7  LV IVS:        1.30 cm   LV e' lateral:   13.00 cm/s  LVOT diam:     2.00 cm   LV E/e' lateral: 10.2  LV SV:         52  LV SV Index:   21  LVOT Area:     3.14 cm   Patient Profile     75 y.o. male male PMH atrial fibrillation and flutter post ablation in 2011 at [facility], coronary artery disease, chronic systolic heart failure, CKD 3, DM-2, COPD, hypertension and morbid obesity. Recent acute systolic and diastolic CHF- admitted from OV with Dr. Quentin Ore with acute CHF.  He felt he was urinating more on torsemide.  His A. fib is likely permanent having failed ablation in the past and he does have a wide QRS of 150 ms.  Assessment & Plan    Acute systolic and diastolic CHF/ ICM  -- recent EF 35-40%, mild LVH,   There is severely elevated pulmonary artery systolic pressure.  --since admit neg 1666 ml wt 286.44 lbs on admit and now 284.02 lbs on lasix 80 mg BID .  Continue to diuresis --BNP 498 Dig level 0.6    Persistent atrial fib - rate controlled currently but RVR on admit. On eliquis.  Per Dr. Quentin Ore " In the long run, he may benefit from AV junction ablation with CRT implant.  He is not currently a candidate for this given his tenuous respiratory status.  He also has wounds on his lower extremities that need to be addressed.  We can reevaluate his eligibility for the above procedures once he is hemodynamically stable and euvolemic. On Eliquis for stroke prophylaxis.  Continue this or heparin as inpatient."  ? Continue to diuresis over weekend and EP consult for CRT and AV junction ablation. Concern if discharged may return with edema.    CAD hx of CABG - no angina on ASA, statin, and zetia   Hypokalemia with replacing - today at 3.2 will add 40 meg now and repeat in an hour.  Then 40 daily but continue to monitor  CKD 2-3 stable  today Cr 1.31    OSA on CPAP   DM-2 on insulin A1C of 6.8  levemir 30 units at bedtime  and SSI  glucose controlled for now.187 to 143.  Stable   Areas of wounds on legs evaluated by skin care nurse, continue with support stocking, no drainage of wounds.     For questions or updates, please contact [facility] Please consult [email or web address] for contact info under        Signed, Mickel Baas  Saurabh Hettich, NP  05/02/2021, 7:59 AM

## 2021-05-02 NOTE — Progress Notes (Signed)
Mobility Specialist Progress Note:   05/02/21 1241  Mobility  Activity Ambulated in hall  Level of Assistance Standby assist, set-up cues, supervision of patient - no hands on  Assistive Device Cane  Distance Ambulated (ft) 80 ft  Mobility Ambulated with assistance in hallway  Mobility Response Tolerated well  Mobility performed by Mobility specialist  $Mobility charge 1 Mobility   Pt received EOB willing to participate in mobility. No complaints of pain and pt stated " he feels better today and not so SOB". Pt returned to EOB with cal bell in reach and all needs met.   [facility] Health and safety inspector Phone [phone]

## 2021-05-03 DIAGNOSIS — I4821 Permanent atrial fibrillation: Secondary | ICD-10-CM | POA: Diagnosis not present

## 2021-05-03 DIAGNOSIS — G4733 Obstructive sleep apnea (adult) (pediatric): Secondary | ICD-10-CM | POA: Diagnosis not present

## 2021-05-03 DIAGNOSIS — Z951 Presence of aortocoronary bypass graft: Secondary | ICD-10-CM | POA: Diagnosis not present

## 2021-05-03 DIAGNOSIS — I5023 Acute on chronic systolic (congestive) heart failure: Secondary | ICD-10-CM | POA: Diagnosis not present

## 2021-05-03 LAB — BASIC METABOLIC PANEL
Anion gap: 8 (ref 5–15)
BUN: 28 mg/dL — ABNORMAL HIGH (ref 8–23)
CO2: 33 mmol/L — ABNORMAL HIGH (ref 22–32)
Calcium: 9 mg/dL (ref 8.9–10.3)
Chloride: 100 mmol/L (ref 98–111)
Creatinine, Ser: 1.21 mg/dL (ref 0.61–1.24)
GFR, Estimated: >60 mL/min (ref >60)
Glucose, Bld: 148 mg/dL — ABNORMAL HIGH (ref 70–99)
Potassium: 3.4 mmol/L — ABNORMAL LOW (ref 3.5–5.1)
Sodium: 141 mmol/L (ref 135–145)

## 2021-05-03 LAB — GLUCOSE, CAPILLARY
Glucose-Capillary: 134 mg/dL — ABNORMAL HIGH (ref 70–99)
Glucose-Capillary: 224 mg/dL — ABNORMAL HIGH (ref 70–99)
Glucose-Capillary: 235 mg/dL — ABNORMAL HIGH (ref 70–99)

## 2021-05-03 LAB — MAGNESIUM: Magnesium: 2.1 mg/dL (ref 1.7–2.4)

## 2021-05-03 NOTE — Progress Notes (Signed)
Pt on home cpap when RT entered room. Pt stated that RN placed pt on cpap 2 L O2 flow.

## 2021-05-03 NOTE — Progress Notes (Addendum)
Progress Note  Patient Name: Tommy Riley Date of Encounter: 05/03/2021  [facility] Cardiologist: Rozann Lesches, MD   Subjective     Doing well this morning.  Denies any shortness of breath or chest pain.  He slept great last night on CPAP  Inpatient Medications    Scheduled Meds:  albuterol  2.5 mg Nebulization BID   apixaban  5 mg Oral BID   aspirin  81 mg Oral Daily   dapagliflozin propanediol  10 mg Oral Daily   digoxin  0.125 mg Oral Daily   ezetimibe  10 mg Oral Daily   feeding supplement  237 mL Oral BID BM   fluticasone  1 spray Each Nare QHS   fluticasone furoate-vilanterol  1 puff Inhalation Daily   And   umeclidinium bromide  1 puff Inhalation Daily   folic acid  979 mcg Oral Daily   furosemide  80 mg Intravenous TID with meals   guaiFENesin  1,200 mg Oral BID   insulin aspart  0-20 Units Subcutaneous TID WC   insulin aspart  15 Units Subcutaneous QPM   insulin detemir  30 Units Subcutaneous QHS   loratadine  10 mg Oral Daily   metoprolol  200 mg Oral Daily   omega-3 acid ethyl esters  2 g Oral BID   potassium chloride  40 mEq Oral Daily   rosuvastatin  40 mg Oral QHS   sacubitril-valsartan  1 tablet Oral BID   sertraline  150 mg Oral Daily   sodium chloride flush  3 mL Intravenous Q12H   spironolactone  12.5 mg Oral Daily   Continuous Infusions:  sodium chloride     PRN Meds: sodium chloride, acetaminophen, albuterol, ondansetron (ZOFRAN) IV, sodium chloride flush, zolpidem   Vital Signs    Vitals:   05/02/21 1122 05/02/21 1504 05/02/21 1953 05/03/21 0427  BP: 124/67 (!) 115/53 117/71 135/67  Pulse: 94 73 67 77  Resp: 18 19 18 19   Temp: 98.3 F (36.8 C) 98 F (36.7 C) 97.6 F (36.4 C) 97.6 F (36.4 C)  TempSrc: Oral Oral Oral Oral  SpO2: 97% 95% 97% 93%  Weight:    129.8 kg  Height:        Intake/Output Summary (Last 24 hours) at 05/03/2021 0727 Last data filed at 05/03/2021 0431 Gross per 24 hour  Intake 920 ml  Output  2525 ml  Net -1605 ml    Last 3 Weights 05/03/2021 05/02/2021 05/01/2021  Weight (lbs) 286 lb 3.2 oz 284 lb 9.6 oz 286 lb 4.8 oz  Weight (kg) 129.819 kg 129.094 kg 129.865 kg      Telemetry    Atrial fibrillation with controlled ventricular response- Personally Reviewed  ECG    No new - Personally Reviewed  Physical Exam   GEN: Well nourished, well developed in no acute distress HEENT: Normal NECK: No JVD; No carotid bruits LYMPHATICS: No lymphadenopathy CARDIAC: Irregularly irregular, no murmurs, rubs, gallops RESPIRATORY:  Clear to auscultation without rales, wheezing or rhonchi  ABDOMEN: Soft, non-tender, non-distended MUSCULOSKELETAL: 1+ edema of his arms hands and legs ; No deformity  SKIN: Warm and dry NEUROLOGIC:  Alert and oriented x 3 PSYCHIATRIC:  Normal affect   Labs    High Sensitivity Troponin:   Recent Labs  Lab 04/13/21 1724 04/13/21 1933  TROPONINIHS 127* 118*      Chemistry Recent Labs  Lab 05/01/21 0304 05/01/21 1336 05/02/21 0349 05/03/21 0224  NA 141 141 143 141  K 2.7*  3.3* 3.2* 3.4*  CL 98 99 100 100  CO2 36* 34* 36* 33*  GLUCOSE 189* 170* 166* 148*  BUN 21 22 24* 28*  CREATININE 1.31* 1.29* 1.32* 1.21  CALCIUM 8.7* 8.9 9.0 9.0  MG 1.9  --   --  2.1  PROT 5.6*  --   --   --   ALBUMIN 3.0*  --   --   --   AST 41  --   --   --   ALT 43  --   --   --   ALKPHOS 81  --   --   --   BILITOT 1.8*  --   --   --   GFRNONAA 58* 59* 57* >60  ANIONGAP 7 8 7 8      Lipids No results for input(s): CHOL, TRIG, HDL, LABVLDL, LDLCALC, CHOLHDL in the last 168 hours.  HematologyNo results for input(s): WBC, RBC, HGB, HCT, MCV, MCH, MCHC, RDW, PLT in the last 168 hours. Thyroid No results for input(s): TSH, FREET4 in the last 168 hours.  BNP Recent Labs  Lab 05/01/21 0304  BNP 498.7*     DDimer No results for input(s): DDIMER in the last 168 hours.   Radiology    No results found.  Cardiac Studies   Echo 04/14/21 TTE IMPRESSIONS      1. Left ventricular ejection fraction, by estimation, is 35 to 40%. The  left ventricle has moderately decreased function. The left ventricle  demonstrates global hypokinesis. There is mild left ventricular  hypertrophy. Left ventricular diastolic  parameters are indeterminate. In Afib with RVR during study, consider  repeat limited echo to better evaluate systolic function once rates better  controlled.   2. Right ventricular systolic function is mildly reduced. The right  ventricular size is normal. There is severely elevated pulmonary artery  systolic pressure. The estimated right ventricular systolic pressure is  50.0 mmHg.   3. Left atrial size was severely dilated.   4. Right atrial size was severely dilated.   5. The mitral valve is normal in structure. Trivial mitral valve  regurgitation.   6. The aortic valve is tricuspid. Aortic valve regurgitation is not  visualized. No aortic stenosis is present.   7. The inferior vena cava is dilated in size with <50% respiratory  variability, suggesting right atrial pressure of 15 mmHg.   FINDINGS   Left Ventricle: Left ventricular ejection fraction, by estimation, is 35  to 40%. The left ventricle has moderately decreased function. The left  ventricle demonstrates global hypokinesis. Definity contrast agent was  given IV to delineate the left  ventricular endocardial borders. The left ventricular internal cavity size  was normal in size. There is mild left ventricular hypertrophy. Left  ventricular diastolic parameters are indeterminate.   Right Ventricle: The right ventricular size is normal. Right vetricular  wall thickness was not well visualized. Right ventricular systolic  function is mildly reduced. There is severely elevated pulmonary artery  systolic pressure. The tricuspid  regurgitant velocity is 3.57 m/s, and with an assumed right atrial  pressure of 15 mmHg, the estimated right ventricular systolic pressure is   93.8 mmHg.   Left Atrium: Left atrial size was severely dilated.   Right Atrium: Right atrial size was severely dilated.   Pericardium: There is no evidence of pericardial effusion.   Mitral Valve: The mitral valve is normal in structure. Trivial mitral  valve regurgitation. MV peak gradient, 8.2 mmHg. The mean mitral valve  gradient  is 4.0 mmHg.   Tricuspid Valve: The tricuspid valve is normal in structure. Tricuspid  valve regurgitation is trivial.   Aortic Valve: The aortic valve is tricuspid. Aortic valve regurgitation is  not visualized. No aortic stenosis is present. Aortic valve mean gradient  measures 3.7 mmHg. Aortic valve peak gradient measures 7.9 mmHg. Aortic  valve area, by VTI measures 2.55  cm.   Pulmonic Valve: The pulmonic valve was not well visualized. Pulmonic valve  regurgitation is not visualized.   Aorta: The aortic root is normal in size and structure.   Venous: The inferior vena cava is dilated in size with less than 50%  respiratory variability, suggesting right atrial pressure of 15 mmHg.   IAS/Shunts: The interatrial septum was not well visualized.      LEFT VENTRICLE  PLAX 2D  LVIDd:         5.40 cm   Diastology  LVIDs:         4.55 cm   LV e' medial:    9.00 cm/s  LV PW:         1.30 cm   LV E/e' medial:  14.7  LV IVS:        1.30 cm   LV e' lateral:   13.00 cm/s  LVOT diam:     2.00 cm   LV E/e' lateral: 10.2  LV SV:         52  LV SV Index:   21  LVOT Area:     3.14 cm   Patient Profile     75 y.o. male male PMH atrial fibrillation and flutter post ablation in [facility] at [facility], coronary artery disease, chronic systolic heart failure, CKD 3, DM-2, COPD, hypertension and morbid obesity. Recent acute systolic and diastolic CHF- admitted from [facility] with Dr. Quentin Ore with acute CHF.  He felt he was urinating more on torsemide.  His A. fib is likely permanent having failed ablation in the past and he does have a wide QRS of 150  ms.  Assessment & Plan    Acute systolic and diastolic CHF/ ICM  -- recent EF 35-40%, mild LVH,   There is severely elevated pulmonary artery systolic pressure.  --since admit weight is only down 1 pound and actually went up 2 pounds from yesterday despite putting 2.5 L out so question their accuracy -- He put out 2.5 L yesterday and is net -3.27 L since admission  -- Serum creatinine stable at 1.2 and potassium 3.4  -- Continue on lasix 80 mg IV 3 times daily, Farxiga 10 mg daily, digoxin 0.125 mg daily, Toprol-XL 200 mg daily, Entresto 24-26 mg twice daily and spironolactone 12.5 mg daily -- Replete potassium to keep greater than 4   Permanent atrial fib  -- Heart rate remains controlled on telemetry on eliquis.   --Per EP may benefit from AV junction ablation with CRT implant.  He is not currently a candidate for this given his tenuous respiratory status.  He also has wounds on his lower extremities that need to be addressed.  We can reevaluate his eligibility for the above procedures once he is hemodynamically stable and euvolemic. I will talk with EP today --Continue Eliquis 5 mg twice daily for stroke prophylaxis.   --Continue to diuresis over weekend and EP consult for CRT and AV junction ablation. Concern if discharged may return with edema.    CAD hx of CABG  --He denies any anginal symptoms --Continue aspirin 81 mg daily, high-dose  statin and Zetia   Hypokalemia  --with replacing - today at 3.4  -- Repeat bmet tomorrow a.m. after getting K-Dur 40 mEq this a.m.  CKD 2-3 stable   -- Renal function remained stable with serum creatinine 1.21 today -- Continue to monitor while diuresing   OSA on CPAP   DM-2 on insulin A1C of 6.8  levemir 30 units at bedtime  and SSI  glucose controlled for now 148 this morning stable   Areas of wounds on legs evaluated by skin care nurse, continue with support stocking, no drainage of wounds.   I have spent a total of 35 minutes with patient  reviewing 2D echo, telemetry, EKGs, labs and examining patient as well as establishing an assessment and plan that was discussed with the patient.  > 50% of time was spent in direct patient care.      For questions or updates, please contact [place] Please consult [email or web address] for contact info under        Signed, Fransico Him, MD  05/03/2021, 7:27 AM

## 2021-05-04 DIAGNOSIS — I5023 Acute on chronic systolic (congestive) heart failure: Secondary | ICD-10-CM | POA: Diagnosis not present

## 2021-05-04 DIAGNOSIS — I4891 Unspecified atrial fibrillation: Secondary | ICD-10-CM

## 2021-05-04 DIAGNOSIS — Z951 Presence of aortocoronary bypass graft: Secondary | ICD-10-CM | POA: Diagnosis not present

## 2021-05-04 DIAGNOSIS — G4733 Obstructive sleep apnea (adult) (pediatric): Secondary | ICD-10-CM | POA: Diagnosis not present

## 2021-05-04 DIAGNOSIS — I4821 Permanent atrial fibrillation: Secondary | ICD-10-CM | POA: Diagnosis not present

## 2021-05-04 LAB — BASIC METABOLIC PANEL
Anion gap: 9 (ref 5–15)
BUN: 29 mg/dL — ABNORMAL HIGH (ref 8–23)
CO2: 33 mmol/L — ABNORMAL HIGH (ref 22–32)
Calcium: 9.4 mg/dL (ref 8.9–10.3)
Chloride: 99 mmol/L (ref 98–111)
Creatinine, Ser: 1.2 mg/dL (ref 0.61–1.24)
GFR, Estimated: >60 mL/min (ref >60)
Glucose, Bld: 166 mg/dL — ABNORMAL HIGH (ref 70–99)
Potassium: 3.3 mmol/L — ABNORMAL LOW (ref 3.5–5.1)
Sodium: 141 mmol/L (ref 135–145)

## 2021-05-04 LAB — GLUCOSE, CAPILLARY
Glucose-Capillary: 146 mg/dL — ABNORMAL HIGH (ref 70–99)
Glucose-Capillary: 152 mg/dL — ABNORMAL HIGH (ref 70–99)
Glucose-Capillary: 184 mg/dL — ABNORMAL HIGH (ref 70–99)
Glucose-Capillary: 189 mg/dL — ABNORMAL HIGH (ref 70–99)

## 2021-05-04 MED ORDER — SODIUM CHLORIDE 0.9 % IV SOLN
250.0000 mL | INTRAVENOUS | Status: DC | PRN
  Administered 2021-05-05: 250 mL via INTRAVENOUS

## 2021-05-04 MED ORDER — SODIUM CHLORIDE 0.9% FLUSH: 3.0000 mL | INTRAVENOUS | Status: DC | PRN

## 2021-05-04 MED ORDER — SODIUM CHLORIDE 0.9% FLUSH
3.0000 mL | Freq: Two times a day (BID) | INTRAVENOUS | Status: DC
  Administered 2021-05-04 – 2021-05-05 (×2): 3 mL via INTRAVENOUS

## 2021-05-04 MED ORDER — SODIUM CHLORIDE 0.9 % IV SOLN: INTRAVENOUS | Status: DC

## 2021-05-04 MED ORDER — POTASSIUM CHLORIDE CRYS ER 20 MEQ PO TBCR
40.0000 meq | EXTENDED_RELEASE_TABLET | Freq: Two times a day (BID) | ORAL | Status: DC
  Administered 2021-05-04 – 2021-05-08 (×9): 40 meq via ORAL
  Filled 2021-05-04 (×10): qty 2

## 2021-05-04 MED ORDER — ASPIRIN 81 MG PO CHEW
81.0000 mg | CHEWABLE_TABLET | ORAL | Status: AC
  Administered 2021-05-05: 81 mg via ORAL
  Filled 2021-05-04: qty 1

## 2021-05-04 NOTE — Consult Note (Signed)
Cardiology Consultation:   Patient ID: Tommy Riley MRN: [id number]; DOB: Feb 08, 1946  Admit date: 04/30/2021 Date of Consult: 05/04/2021  PCP:  Clinton Quant, MD   [facility] Providers Cardiologist:  Rozann Lesches, MD  Electrophysiologist:  Vickie Epley, MD      Patient Profile:   Tommy Riley is a 75 y.o. male with a hx of atrial fib and recurrent CHF who is being seen 05/04/2021 for the evaluation of uncontrolled atrial fib at the request of Dr. Varney Daily.  History of Present Illness:   Tommy Riley was admitted 5 days ago with decompensated CHF. He had been hospitalized a week before at [facility] where he was treated with IV lasix and became progressively more sob. He has had a drop in his LV function. His EF was 50% and now 35-40% despite maximal medical therapy. He has a longstanding h/o CAD and underwent CABG 20 years ago at [facility]. He had repeat heart cath 11 years ago in [place] and was told his grafts were patent. He does not have angina. His rates in the hosptial have been fairly well controlled at least over the last 48 hours. He denies syncope. He has LBBB/IVCD and class 3 CHF and a QRS duration of 150 ms. He has had atrial fib for over 20 years but appears to be chronically in atrial fib at least 10 years.    Past Medical History:  Diagnosis Date   Anxiety    Atrial fibrillation and flutter ([place])    Atrial fibrillation ablation 2011 at [facility]   CAD in native artery    Chronic systolic heart failure (HCC)    CKD (chronic kidney disease), stage III (HCC)    COPD (chronic obstructive pulmonary disease) ([place])    COVID-22 February 2020   Depression    Essential hypertension    Gout    Hyperlipidemia    Ischemic heart disease    OSA (obstructive sleep apnea)    Stroke ([place]) 2003   Type 2 diabetes mellitus ([place])     Past Surgical History:  Procedure Laterality Date   ATRIAL FIBRILLATION ABLATION  2011   [facility]   CATARACT EXTRACTION Bilateral     CORONARY ARTERY BYPASS GRAFT  2001   HERNIA REPAIR Right    TONSILLECTOMY AND ADENOIDECTOMY       Home Medications:  Prior to Admission medications   Medication Sig Start Date End Date Taking? Authorizing Provider  albuterol (PROVENTIL) (2.5 MG/3ML) 0.083% nebulizer solution Take 3 mLs (2.5 mg total) by nebulization every 6 (six) hours as needed for wheezing or shortness of breath. 07/29/18  Yes Byrum, Rose Fillers, MD  albuterol (VENTOLIN HFA) 108 (90 Base) MCG/ACT inhaler USE 2 INHALATIONS BY MOUTH  EVERY 6 HOURS AS NEEDED FOR WHEEZING OR SHORTNESS OF  BREATH 10/07/20  Yes Byrum, Rose Fillers, MD  apixaban (ELIQUIS) 5 MG TABS tablet Take 1 tablet (5 mg total) by mouth 2 (two) times daily. 04/19/21 05/19/21 Yes Arrien, Jimmy Picket, MD  aspirin 81 MG tablet Take 81 mg by mouth daily.   Yes [provider]  b complex vitamins tablet Take 1 tablet by mouth in the morning and at bedtime.   Yes [provider]  Cholecalciferol (VITAMIN D3) 2000 UNITS TABS Take 1 capsule by mouth in the morning and at bedtime.   Yes [provider]  digoxin (LANOXIN) 0.125 MG tablet Take 1 tablet (0.125 mg total) by mouth daily. 04/20/21 05/20/21 Yes Arrien, Mauricio  Quillian Quince, MD  diphenhydramine-acetaminophen (TYLENOL PM) 25-500 MG TABS tablet Take 1 tablet by mouth at bedtime as needed (sleep).   Yes [provider]  ezetimibe (ZETIA) 10 MG tablet Take 10 mg by mouth daily.   Yes [provider]  FARXIGA 10 MG TABS tablet Take 1 tablet (10 mg total) by mouth daily. 12/27/20  Yes Satira Sark, MD  Fluticasone-Umeclidin-Vilant (TRELEGY ELLIPTA) 100-62.5-25 MCG/INH AEPB Inhale 1 puff into the lungs daily. Patient needs 90 day script with 3 refills. 04/22/21  Yes Young, Tarri Fuller D, MD  folic acid (FOLVITE) 341 MCG tablet Take 400 mcg by mouth daily.   Yes [provider]  guaiFENesin (MUCINEX) 600 MG 12 hr tablet Take 1,200 mg by mouth 2 (two) times daily.   Yes [provider]  HUMALOG KWIKPEN 200 UNIT/ML KwikPen Inject 20 Units into the skin every evening. 11/11/20  Yes [provider]  insulin detemir (LEVEMIR) 100 UNIT/ML injection Inject 30 Units into the skin daily.   Yes [provider]  loratadine (CLARITIN) 10 MG tablet Take 10 mg by mouth daily.   Yes [provider]  MELATONIN PO Take 5 mg by mouth at bedtime as needed (sleep).   Yes [provider]  metoprolol succinate (TOPROL-XL) 200 MG 24 hr tablet Take 1 tablet (200 mg total) by mouth daily. Take with or immediately following a meal. 04/20/21 05/20/21 Yes Arrien, Jimmy Picket, MD  Multiple Vitamins-Minerals (CENTRUM SILVER PO) Take 1 tablet by mouth daily.    Yes [provider]  Nutritional Supplements (COLD AND FLU PO) Take 1 tablet by mouth daily as needed (cough aches).   Yes [provider]  omega-3 acid ethyl esters (LOVAZA) 1 G capsule Take by mouth in the morning, at noon, and at bedtime.   Yes [provider]  oxymetazoline (AFRIN) 0.05 % nasal spray Place 1 spray into both nostrils 2 (two) times daily.   Yes [provider]  rosuvastatin (CRESTOR) 40 MG tablet Take 40 mg by mouth daily.   Yes [provider]  sacubitril-valsartan (ENTRESTO) 24-26 MG Take 1 tablet by mouth 2 (two) times daily. 03/31/21  Yes Satira Sark, MD  SERTRALINE HCL PO Take 150 mg by mouth daily.   Yes [provider]  spironolactone (ALDACTONE) 25 MG tablet Take 0.5 tablets (12.5 mg total) by mouth daily. 04/20/21 05/20/21 Yes Arrien, Jimmy Picket, MD  torsemide (DEMADEX) 20 MG tablet Take 40 mg by mouth 2 (two) times daily.   Yes [provider]  vitamin C (ASCORBIC ACID) 500 MG tablet Take 500 mg by mouth daily.   Yes [provider]  Zinc Methionate 50 MG CAPS Take 1 capsule by mouth daily.   Yes [provider]  ACCU-CHEK AVIVA PLUS test strip  07/09/14   [provider]   Blood Glucose Monitoring Suppl (ONE TOUCH ULTRA 2) W/DEVICE KIT by Does not apply route.    [provider]    Inpatient Medications: Scheduled Meds:  albuterol  2.5 mg Nebulization BID   apixaban  5 mg Oral BID   aspirin  81 mg Oral Daily   dapagliflozin propanediol  10 mg Oral Daily   digoxin  0.125 mg Oral Daily   ezetimibe  10 mg Oral Daily   feeding supplement  237 mL Oral BID BM   fluticasone  1 spray Each Nare QHS   fluticasone furoate-vilanterol  1 puff Inhalation Daily   And   umeclidinium bromide  1 puff Inhalation Daily   folic acid  657 mcg Oral Daily   furosemide  80 mg Intravenous TID with meals   guaiFENesin  1,200 mg Oral BID   insulin aspart  0-20 Units Subcutaneous TID WC   insulin aspart  15 Units Subcutaneous QPM   insulin detemir  30 Units Subcutaneous QHS   loratadine  10 mg Oral Daily   metoprolol  200 mg Oral Daily   omega-3 acid ethyl esters  2 g Oral BID   potassium chloride  40 mEq Oral BID   rosuvastatin  40 mg Oral QHS   sacubitril-valsartan  1 tablet Oral BID   sertraline  150 mg Oral Daily   sodium chloride flush  3 mL Intravenous Q12H   spironolactone  12.5 mg Oral Daily   Continuous Infusions:  sodium chloride     PRN Meds: sodium chloride, acetaminophen, albuterol, ondansetron (ZOFRAN) IV, sodium chloride flush, zolpidem  Allergies:    Allergies  Allergen Reactions   Codeine Anaphylaxis    Tightness in chest    Social History:   Social History   Socioeconomic History   Marital status: Married    Spouse name: Games developer   Number of children: 3   Years of education: 14   Highest education level: Some college, no degree  Occupational History    Comment: retired  Tobacco Use   Smoking status: Former    Packs/day: 2.50    Years: 50.00    Pack years: 125.00    Types: Cigarettes    Quit date: 06/17/2011    Years since quitting: 9.8   Smokeless tobacco: Never  Vaping Use   Vaping Use: Some days   Start date:  07/06/2017   Last attempt to quit: 04/19/2021   Substances: Flavoring  Substance and Sexual Activity   Alcohol use: No    Comment: 1 beer a month   Drug use: No   Sexual activity: Not on file  Other Topics Concern   Not on file  Social History Narrative   Consumes no caffeine   Social Determinants of Health   Financial Resource Strain: Low Risk    Difficulty of Paying Living Expenses: Not hard at all  Food Insecurity: No Food Insecurity   Worried About Charity fundraiser in the Last Year: Never true   Ran Out of Food in the Last Year: Never true  Transportation Needs: No Transportation Needs   Lack of Transportation (Medical): No   Lack of Transportation (Non-Medical): No  Physical Activity: Not on file  Stress: Not on file  Social Connections: Not on file  Intimate Partner Violence: Not on file    Family History:    Family History  Problem Relation Age of Onset   Raynaud syndrome Mother    Stroke Father    Diabetes Mellitus II Father    Hypertension Father    Hypertension Sister    Diabetes Mellitus II Sister    Stroke Brother    Crohn's disease Brother    Breast cancer Brother    COPD Brother      ROS:  Please see the history of present illness.   All other ROS reviewed and negative.     Physical Exam/Data:   Vitals:   05/03/21 1924 05/03/21 2111 05/04/21 0500 05/04/21 0706  BP: 120/66   (!) 142/76  Pulse: 64     Resp: 18   18  Temp: 99 F (37.2 C)   97.7 F (36.5 C)  TempSrc: Oral   Axillary  SpO2: 98% 94%  98%  Weight:   129.9 kg   Height:        Intake/Output Summary (Last 24 hours) at 05/04/2021 1101 Last data filed at 05/04/2021 0700 Gross per 24 hour  Intake --  Output 1100 ml  Net -1100 ml   Last 3 Weights 05/04/2021 05/03/2021 05/02/2021  Weight (lbs) 286 lb 6 oz 286 lb 3.2 oz 284 lb 9.6 oz  Weight (kg) 129.9 kg 129.819 kg 129.094 kg     Body mass index is 38.84 kg/m.  General:  Well nourished, well developed, in no acute  distress HEENT: normal Neck: no JVD Vascular: No carotid bruits; Distal pulses 2+ bilaterally Cardiac:  normal S1, S2; IRIRR; no murmur split S2.   Lungs:  clear to auscultation bilaterally, no wheezing, rhonchi. Minimal basilar rales Abd: soft, nontender, no hepatomegaly  Ext: no edema Musculoskeletal:  No deformities, BUE and BLE strength normal and equal Skin: warm and dry  Neuro:  CNs 2-12 intact, no focal abnormalities noted Psych:  Normal affect   EKG:  The EKG was personally reviewed and demonstrates:  atrial fib with LBBB/IVCD Telemetry:  Telemetry was personally reviewed and demonstrates:  atrial fib with a controlled VR  Relevant CV Studies: none  Laboratory Data:  High Sensitivity Troponin:   Recent Labs  Lab 04/13/21 1724 04/13/21 1933  TROPONINIHS 127* 118*     Chemistry Recent Labs  Lab 05/01/21 0304 05/01/21 1336 05/02/21 0349 05/03/21 0224 05/04/21 0313  NA 141   < > 143 141 141  K 2.7*   < > 3.2* 3.4* 3.3*  CL 98   < > 100 100 99  CO2 36*   < > 36* 33* 33*  GLUCOSE 189*   < > 166* 148* 166*  BUN 21   < > 24* 28* 29*  CREATININE 1.31*   < > 1.32* 1.21 1.20  CALCIUM 8.7*   < > 9.0 9.0 9.4  MG 1.9  --   --  2.1  --   GFRNONAA 58*   < > 57* >60 >60  ANIONGAP 7   < > 7 8 9    < > = values in this interval not displayed.    Recent Labs  Lab 05/01/21 0304  PROT 5.6*  ALBUMIN 3.0*  AST 41  ALT 43  ALKPHOS 81  BILITOT 1.8*   Lipids No results for input(s): CHOL, TRIG, HDL, LABVLDL, LDLCALC, CHOLHDL in the last 168 hours.  HematologyNo results for input(s): WBC, RBC, HGB, HCT, MCV, MCH, MCHC, RDW, PLT in the last 168 hours. Thyroid No results for input(s): TSH, FREET4 in the last 168 hours.  BNP Recent Labs  Lab 05/01/21 0304  BNP 498.7*    DDimer No results for input(s): DDIMER in the last 168 hours.   Radiology/Studies:  No results found.   Assessment and Plan:   Acute on chronic systolic heart failure - he is on maximal medical  therapy and is still having problems with recurrent CHF. I have recommended he undergo left and right heart cath. His EF is down and his symptoms have worsened. He has been diuresed and is now improved but has now had 2 admits in less than a month. I would imagine he will need to see our heart failure clinic.  Stage 2 renal insuff. - his creatiine is stable. Atrial fib - at times his rates are not well controlled. He is not a candidate for rhythm  control.  Obesity - this is part of his problem. He will be encouraged to lose weight.   Risk Assessment/Risk Scores:    {New York Heart Association (NYHA) Functional Class NYHA Class III  CHA2DS2-VASc Score = 7   This indicates a 11.2% annual risk of stroke. The patient's score is based upon: CHF History: 1 HTN History: 1 Diabetes History: 1 Stroke History: 2 Vascular Disease History: 1 Age Score: 1 Gender Score: 0   For questions or updates, please contact [place] Please consult [email or web address] for contact info under   Signed, Cristopher Peru, MD  05/04/2021 11:01 AM

## 2021-05-04 NOTE — Progress Notes (Addendum)
Progress Note  Patient Name: Tommy Riley Date of Encounter: 05/04/2021  [facility] Cardiologist: Rozann Lesches, MD   Subjective   Denies any chest pain or SOB.  Still has LE edema  Inpatient Medications    Scheduled Meds:  albuterol  2.5 mg Nebulization BID   apixaban  5 mg Oral BID   aspirin  81 mg Oral Daily   dapagliflozin propanediol  10 mg Oral Daily   digoxin  0.125 mg Oral Daily   ezetimibe  10 mg Oral Daily   feeding supplement  237 mL Oral BID BM   fluticasone  1 spray Each Nare QHS   fluticasone furoate-vilanterol  1 puff Inhalation Daily   And   umeclidinium bromide  1 puff Inhalation Daily   folic acid  229 mcg Oral Daily   furosemide  80 mg Intravenous TID with meals   guaiFENesin  1,200 mg Oral BID   insulin aspart  0-20 Units Subcutaneous TID WC   insulin aspart  15 Units Subcutaneous QPM   insulin detemir  30 Units Subcutaneous QHS   loratadine  10 mg Oral Daily   metoprolol  200 mg Oral Daily   omega-3 acid ethyl esters  2 g Oral BID   potassium chloride  40 mEq Oral Daily   rosuvastatin  40 mg Oral QHS   sacubitril-valsartan  1 tablet Oral BID   sertraline  150 mg Oral Daily   sodium chloride flush  3 mL Intravenous Q12H   spironolactone  12.5 mg Oral Daily   Continuous Infusions:  sodium chloride     PRN Meds: sodium chloride, acetaminophen, albuterol, ondansetron (ZOFRAN) IV, sodium chloride flush, zolpidem   Vital Signs    Vitals:   05/03/21 1924 05/03/21 2111 05/04/21 0500 05/04/21 0706  BP: 120/66   (!) 142/76  Pulse: 64     Resp: 18   18  Temp: 99 F (37.2 C)   97.7 F (36.5 C)  TempSrc: Oral   Axillary  SpO2: 98% 94%  98%  Weight:   129.9 kg   Height:        Intake/Output Summary (Last 24 hours) at 05/04/2021 1032 Last data filed at 05/04/2021 0700 Gross per 24 hour  Intake --  Output 1100 ml  Net -1100 ml    Last 3 Weights 05/04/2021 05/03/2021 05/02/2021  Weight (lbs) 286 lb 6 oz 286 lb 3.2 oz 284 lb 9.6  oz  Weight (kg) 129.9 kg 129.819 kg 129.094 kg      Telemetry    Atrial fibrillation with CVR- Personally Reviewed  ECG    No new - Personally Reviewed  Physical Exam   GEN: Well nourished, well developed in no acute distress HEENT: Normal NECK: No JVD; No carotid bruits LYMPHATICS: No lymphadenopathy CARDIAC:irregularly irregular, no murmurs, rubs, gallops RESPIRATORY:  Clear to auscultation without rales, wheezing or rhonchi  ABDOMEN: Soft, non-tender, non-distended MUSCULOSKELETAL:  1+ edema in arms and legs; No deformity  SKIN: Warm and dry NEUROLOGIC:  Alert and oriented x 3 PSYCHIATRIC:  Normal affect   Labs    High Sensitivity Troponin:   Recent Labs  Lab 04/13/21 1724 04/13/21 1933  TROPONINIHS 127* 118*      Chemistry Recent Labs  Lab 05/01/21 0304 05/01/21 1336 05/02/21 0349 05/03/21 0224 05/04/21 0313  NA 141   < > 143 141 141  K 2.7*   < > 3.2* 3.4* 3.3*  CL 98   < > 100 100 99  CO2 36*   < > 36* 33* 33*  GLUCOSE 189*   < > 166* 148* 166*  BUN 21   < > 24* 28* 29*  CREATININE 1.31*   < > 1.32* 1.21 1.20  CALCIUM 8.7*   < > 9.0 9.0 9.4  MG 1.9  --   --  2.1  --   PROT 5.6*  --   --   --   --   ALBUMIN 3.0*  --   --   --   --   AST 41  --   --   --   --   ALT 43  --   --   --   --   ALKPHOS 81  --   --   --   --   BILITOT 1.8*  --   --   --   --   GFRNONAA 58*   < > 57* >60 >60  ANIONGAP 7   < > 7 8 9    < > = values in this interval not displayed.     Lipids No results for input(s): CHOL, TRIG, HDL, LABVLDL, LDLCALC, CHOLHDL in the last 168 hours.  HematologyNo results for input(s): WBC, RBC, HGB, HCT, MCV, MCH, MCHC, RDW, PLT in the last 168 hours. Thyroid No results for input(s): TSH, FREET4 in the last 168 hours.  BNP Recent Labs  Lab 05/01/21 0304  BNP 498.7*     DDimer No results for input(s): DDIMER in the last 168 hours.   Radiology    No results found.  Cardiac Studies   Echo 04/14/21 TTE IMPRESSIONS     1. Left  ventricular ejection fraction, by estimation, is 35 to 40%. The  left ventricle has moderately decreased function. The left ventricle  demonstrates global hypokinesis. There is mild left ventricular  hypertrophy. Left ventricular diastolic  parameters are indeterminate. In Afib with RVR during study, consider  repeat limited echo to better evaluate systolic function once rates better  controlled.   2. Right ventricular systolic function is mildly reduced. The right  ventricular size is normal. There is severely elevated pulmonary artery  systolic pressure. The estimated right ventricular systolic pressure is  66.4 mmHg.   3. Left atrial size was severely dilated.   4. Right atrial size was severely dilated.   5. The mitral valve is normal in structure. Trivial mitral valve  regurgitation.   6. The aortic valve is tricuspid. Aortic valve regurgitation is not  visualized. No aortic stenosis is present.   7. The inferior vena cava is dilated in size with <50% respiratory  variability, suggesting right atrial pressure of 15 mmHg.   FINDINGS   Left Ventricle: Left ventricular ejection fraction, by estimation, is 35  to 40%. The left ventricle has moderately decreased function. The left  ventricle demonstrates global hypokinesis. Definity contrast agent was  given IV to delineate the left  ventricular endocardial borders. The left ventricular internal cavity size  was normal in size. There is mild left ventricular hypertrophy. Left  ventricular diastolic parameters are indeterminate.   Right Ventricle: The right ventricular size is normal. Right vetricular  wall thickness was not well visualized. Right ventricular systolic  function is mildly reduced. There is severely elevated pulmonary artery  systolic pressure. The tricuspid  regurgitant velocity is 3.57 m/s, and with an assumed right atrial  pressure of 15 mmHg, the estimated right ventricular systolic pressure is  40.3 mmHg.    Left Atrium: Left atrial size  was severely dilated.   Right Atrium: Right atrial size was severely dilated.   Pericardium: There is no evidence of pericardial effusion.   Mitral Valve: The mitral valve is normal in structure. Trivial mitral  valve regurgitation. MV peak gradient, 8.2 mmHg. The mean mitral valve  gradient is 4.0 mmHg.   Tricuspid Valve: The tricuspid valve is normal in structure. Tricuspid  valve regurgitation is trivial.   Aortic Valve: The aortic valve is tricuspid. Aortic valve regurgitation is  not visualized. No aortic stenosis is present. Aortic valve mean gradient  measures 3.7 mmHg. Aortic valve peak gradient measures 7.9 mmHg. Aortic  valve area, by VTI measures 2.55  cm.   Pulmonic Valve: The pulmonic valve was not well visualized. Pulmonic valve  regurgitation is not visualized.   Aorta: The aortic root is normal in size and structure.   Venous: The inferior vena cava is dilated in size with less than 50%  respiratory variability, suggesting right atrial pressure of 15 mmHg.   IAS/Shunts: The interatrial septum was not well visualized.      LEFT VENTRICLE  PLAX 2D  LVIDd:         5.40 cm   Diastology  LVIDs:         4.55 cm   LV e' medial:    9.00 cm/s  LV PW:         1.30 cm   LV E/e' medial:  14.7  LV IVS:        1.30 cm   LV e' lateral:   13.00 cm/s  LVOT diam:     2.00 cm   LV E/e' lateral: 10.2  LV SV:         52  LV SV Index:   21  LVOT Area:     3.14 cm   Patient Profile     75 y.o. male male PMH atrial fibrillation and flutter post ablation in 2011 at [facility], coronary artery disease, chronic systolic heart failure, CKD 3, DM-2, COPD, hypertension and morbid obesity. Recent acute systolic and diastolic CHF- admitted from OV with Dr. Quentin Ore with acute CHF.  He felt he was urinating more on torsemide.  His A. fib is likely permanent having failed ablation in the past and he does have a wide QRS of 150 ms.  Assessment & Plan     Acute systolic and diastolic CHF/ ICM  -- recent EF 35-40%, mild LVH,   There is severely elevated pulmonary artery systolic pressure.  --since admit weight is only down 1 pound and actually went up 2 pounds from yesterday despite putting 2.5 L out so question their accuracy -- He put out 1.1 L yesterday and is net -4.37 L since admission  -- Serum creatinine stable at 1.2 and K+ 3.3 today -- remains volume overloaded -- Continue on lasix 80 mg IV 3 times daily, Farxiga 10 mg daily, digoxin 0.125 mg daily, Toprol-XL 200 mg daily, Entresto 24-26 mg twice daily and spironolactone 12.5 mg daily -- replete K+ to keep > 4   Permanent atrial fib  --HR remains controlled --Per EP may benefit from AV junction ablation with CRT implant. He also has wounds on his lower extremities that need to be addressed.  --Heart rate actually has been well controlled recently so unclear if he really needs to have AV node ablation and CRT at this time -- I have spoken to Dr. Lovena Le with EP and he will see the patient today for consult --Continue  Eliquis 5 mg twice daily for stroke prophylaxis.     CAD hx of CABG  --no CP or SOB --Continue aspirin 81 mg daily, high-dose statin and Zetia   Hypokalemia  --with replacing - today at 3.4  --increase Kdur to 16meq BID --BMET in am   CKD 2-3 stable   -- Renal function remained stable with serum creatinine 1.2 today -- Continue to monitor while diuresing   OSA on CPAP   DM-2 on insulin A1C of 6.8  levemir 30 units at bedtime  and SSI  glucose controlled for now 148 this morning stable   Areas of wounds on legs evaluated by skin care nurse, continue with support stocking, no drainage of wounds.   I have spent a total of 35 minutes with patient reviewing 2D echo, telemetry, EKGs, labs and examining patient as well as establishing an assessment and plan that was discussed with the patient.  > 50% of time was spent in direct patient care.      For questions or  updates, please contact [place] Please consult [email or web address] for contact info under        Signed, Fransico Him, MD  05/04/2021, 10:32 AM

## 2021-05-05 ENCOUNTER — Encounter (HOSPITAL_COMMUNITY)
Admission: AD | Disposition: A | Discharge status: Home Health | Payer: Self-pay | Source: Home / Self Care | Attending: Internal Medicine

## 2021-05-05 DIAGNOSIS — I5023 Acute on chronic systolic (congestive) heart failure: Secondary | ICD-10-CM | POA: Diagnosis not present

## 2021-05-05 DIAGNOSIS — I2510 Atherosclerotic heart disease of native coronary artery without angina pectoris: Secondary | ICD-10-CM | POA: Diagnosis not present

## 2021-05-05 HISTORY — PX: RIGHT/LEFT HEART CATH AND CORONARY/GRAFT ANGIOGRAPHY: CATH118267

## 2021-05-05 LAB — POCT I-STAT 7, (LYTES, BLD GAS, ICA,H+H)
Acid-Base Excess: 10 mmol/L — ABNORMAL HIGH (ref 0.0–2.0)
Bicarbonate: 35.9 mmol/L — ABNORMAL HIGH (ref 20.0–28.0)
Calcium, Ion: 1.15 mmol/L (ref 1.15–1.40)
HCT: 37 % — ABNORMAL LOW (ref 39.0–52.0)
Hemoglobin: 12.6 g/dL — ABNORMAL LOW (ref 13.0–17.0)
O2 Saturation: 96 %
Potassium: 3.7 mmol/L (ref 3.5–5.1)
Sodium: 142 mmol/L (ref 135–145)
TCO2: 37 mmol/L — ABNORMAL HIGH (ref 22–32)
pCO2 arterial: 52.5 mmHg — ABNORMAL HIGH (ref 32.0–48.0)
pH, Arterial: 7.443 (ref 7.350–7.450)
pO2, Arterial: 78 mmHg — ABNORMAL LOW (ref 83.0–108.0)

## 2021-05-05 LAB — POCT I-STAT EG7
Acid-Base Excess: 13 mmol/L — ABNORMAL HIGH (ref 0.0–2.0)
Bicarbonate: 39.7 mmol/L — ABNORMAL HIGH (ref 20.0–28.0)
Calcium, Ion: 1.18 mmol/L (ref 1.15–1.40)
HCT: 38 % — ABNORMAL LOW (ref 39.0–52.0)
Hemoglobin: 12.9 g/dL — ABNORMAL LOW (ref 13.0–17.0)
O2 Saturation: 58 %
Potassium: 3.8 mmol/L (ref 3.5–5.1)
Sodium: 142 mmol/L (ref 135–145)
TCO2: 42 mmol/L — ABNORMAL HIGH (ref 22–32)
pCO2, Ven: 61.4 mmHg — ABNORMAL HIGH (ref 44.0–60.0)
pH, Ven: 7.419 (ref 7.250–7.430)
pO2, Ven: 31 mmHg — CL (ref 32.0–45.0)

## 2021-05-05 LAB — SURGICAL PCR SCREEN
MRSA, PCR: NEGATIVE
Staphylococcus aureus: NEGATIVE

## 2021-05-05 LAB — CBC
HCT: 43.3 % (ref 39.0–52.0)
Hemoglobin: 13.4 g/dL (ref 13.0–17.0)
MCH: 30.1 pg (ref 26.0–34.0)
MCHC: 30.9 g/dL (ref 30.0–36.0)
MCV: 97.3 fL (ref 80.0–100.0)
Platelets: 154 10*3/uL (ref 150–400)
RBC: 4.45 MIL/uL (ref 4.22–5.81)
RDW: 16.9 % — ABNORMAL HIGH (ref 11.5–15.5)
WBC: 3.9 10*3/uL — ABNORMAL LOW (ref 4.0–10.5)
nRBC: 0 % (ref 0.0–0.2)

## 2021-05-05 LAB — BASIC METABOLIC PANEL
Anion gap: 11 (ref 5–15)
BUN: 27 mg/dL — ABNORMAL HIGH (ref 8–23)
CO2: 32 mmol/L (ref 22–32)
Calcium: 9.1 mg/dL (ref 8.9–10.3)
Chloride: 98 mmol/L (ref 98–111)
Creatinine, Ser: 1.24 mg/dL (ref 0.61–1.24)
GFR, Estimated: >60 mL/min (ref >60)
Glucose, Bld: 175 mg/dL — ABNORMAL HIGH (ref 70–99)
Potassium: 3.5 mmol/L (ref 3.5–5.1)
Sodium: 141 mmol/L (ref 135–145)

## 2021-05-05 LAB — GLUCOSE, CAPILLARY
Glucose-Capillary: 166 mg/dL — ABNORMAL HIGH (ref 70–99)
Glucose-Capillary: 133 mg/dL — ABNORMAL HIGH (ref 70–99)
Glucose-Capillary: 132 mg/dL — ABNORMAL HIGH (ref 70–99)
Glucose-Capillary: 128 mg/dL — ABNORMAL HIGH (ref 70–99)
Glucose-Capillary: 188 mg/dL — ABNORMAL HIGH (ref 70–99)

## 2021-05-05 SURGERY — RIGHT/LEFT HEART CATH AND CORONARY/GRAFT ANGIOGRAPHY
Anesthesia: LOCAL

## 2021-05-05 MED ORDER — CHLORHEXIDINE GLUCONATE 4 % EX LIQD: 60.0000 mL | Freq: Once | CUTANEOUS | Status: DC

## 2021-05-05 MED ORDER — SODIUM CHLORIDE 0.9% FLUSH
3.0000 mL | Freq: Two times a day (BID) | INTRAVENOUS | Status: DC
  Administered 2021-05-05 – 2021-05-09 (×8): 3 mL via INTRAVENOUS

## 2021-05-05 MED ORDER — LABETALOL HCL 5 MG/ML IV SOLN: 10.0000 mg | INTRAVENOUS | Status: DC | PRN

## 2021-05-05 MED ORDER — HEPARIN (PORCINE) IN NACL 1000-0.9 UT/500ML-% IV SOLN
INTRAVENOUS | Status: AC
  Filled 2021-05-05: qty 500

## 2021-05-05 MED ORDER — CEFAZOLIN IN SODIUM CHLORIDE 3-0.9 GM/100ML-% IV SOLN
3.0000 g | INTRAVENOUS | Status: DC
  Filled 2021-05-05: qty 100

## 2021-05-05 MED ORDER — SODIUM CHLORIDE 0.9 % IV SOLN: INTRAVENOUS | Status: DC

## 2021-05-05 MED ORDER — IOHEXOL 350 MG/ML SOLN
INTRAVENOUS | Status: DC | PRN
  Administered 2021-05-05: 150 mL

## 2021-05-05 MED ORDER — ASPIRIN 81 MG PO CHEW
81.0000 mg | CHEWABLE_TABLET | Freq: Every day | ORAL | Status: DC
  Administered 2021-05-06 – 2021-05-09 (×4): 81 mg via ORAL
  Filled 2021-05-05 (×4): qty 1

## 2021-05-05 MED ORDER — MIDAZOLAM HCL 2 MG/2ML IJ SOLN
INTRAMUSCULAR | Status: AC
  Filled 2021-05-05: qty 2

## 2021-05-05 MED ORDER — ONDANSETRON HCL 4 MG/2ML IJ SOLN: 4.0000 mg | Freq: Four times a day (QID) | INTRAMUSCULAR | Status: DC | PRN

## 2021-05-05 MED ORDER — SODIUM CHLORIDE 0.9 % IV SOLN
80.0000 mg | INTRAVENOUS | Status: DC
  Filled 2021-05-05: qty 2

## 2021-05-05 MED ORDER — SODIUM CHLORIDE 0.9 % IV SOLN: 250.0000 mL | INTRAVENOUS | Status: DC | PRN

## 2021-05-05 MED ORDER — SODIUM CHLORIDE 0.9% FLUSH: 3.0000 mL | INTRAVENOUS | Status: DC | PRN

## 2021-05-05 MED ORDER — FENTANYL CITRATE (PF) 100 MCG/2ML IJ SOLN
INTRAMUSCULAR | Status: AC
  Filled 2021-05-05: qty 2

## 2021-05-05 MED ORDER — SODIUM CHLORIDE 0.9 % IV SOLN: INTRAVENOUS | Status: AC

## 2021-05-05 MED ORDER — LIDOCAINE HCL (PF) 1 % IJ SOLN
INTRAMUSCULAR | Status: AC
  Filled 2021-05-05: qty 30

## 2021-05-05 MED ORDER — HEPARIN SODIUM (PORCINE) 1000 UNIT/ML IJ SOLN
INTRAMUSCULAR | Status: AC
  Filled 2021-05-05: qty 1

## 2021-05-05 MED ORDER — ACETAMINOPHEN 325 MG PO TABS: 650.0000 mg | ORAL_TABLET | ORAL | Status: DC | PRN

## 2021-05-05 MED ORDER — LIDOCAINE HCL (PF) 1 % IJ SOLN
INTRAMUSCULAR | Status: DC | PRN
  Administered 2021-05-05: 15 mL

## 2021-05-05 MED ORDER — HEPARIN (PORCINE) IN NACL 1000-0.9 UT/500ML-% IV SOLN
INTRAVENOUS | Status: DC | PRN
  Administered 2021-05-05 (×2): 500 mL

## 2021-05-05 MED ORDER — HYDRALAZINE HCL 20 MG/ML IJ SOLN: 10.0000 mg | INTRAMUSCULAR | Status: DC | PRN

## 2021-05-05 SURGICAL SUPPLY — 13 items
CATH INFINITI 5FR MULTPACK ANG (CATHETERS) ×2 IMPLANT
CATH SWAN GANZ 7F STRAIGHT (CATHETERS) ×2 IMPLANT
CLOSURE MYNX CONTROL 6F/7F (Vascular Products) ×2 IMPLANT
KIT HEART LEFT (KITS) ×2 IMPLANT
PACK CARDIAC CATHETERIZATION (CUSTOM PROCEDURE TRAY) ×2 IMPLANT
SHEATH PINNACLE 5F 10CM (SHEATH) ×2 IMPLANT
SHEATH PINNACLE 7F 10CM (SHEATH) ×2 IMPLANT
SHEATH PROBE COVER 6X72 (BAG) ×2 IMPLANT
SYR MEDRAD MARK 7 150ML (SYRINGE) ×2 IMPLANT
TRANSDUCER W/STOPCOCK (MISCELLANEOUS) ×2 IMPLANT
TUBING CIL FLEX 10 FLL-RA (TUBING) ×2 IMPLANT
WIRE EMERALD 3MM-J .025X260CM (WIRE) ×2 IMPLANT
WIRE EMERALD 3MM-J .035X150CM (WIRE) ×2 IMPLANT

## 2021-05-05 NOTE — Evaluation (Signed)
Physical Therapy Evaluation Patient Details Name: Tommy Riley MRN: [id number] DOB: November 03, 1945 Today's Date: 05/05/2021  History of Present Illness  75 y.o. male via direct admit from cardiologist office 04/30/21 for treatment of persistent A fib. Scheduled for L heart cath 05/05/21  PMH:  type 2 diabetes mellitus, hyperlipidemia, CHF, CAD s/p CABG, COPD, OSA on CPAP, gout  Clinical Impression  PTA pt living with wife in single story home with 2 steps to enter. Pt reports that prior to medical decline the beginning of October he was able to ambulate in his home with [facility] and was independent with bADLs and iADLs. Pt is currently limited in safe mobility by increased O2 demand (see General Comments) in presence of generalized weakness and decreased endurance. Pt is currently supervision for transfers and short distance ambulation with SPC. Pt was working with HHPT through [place] prior to hospitalization and PT recommends return to [place] at discharge. PT will continue to follow acutely.       Recommendations for follow up therapy are one component of a multi-disciplinary discharge planning process, led by the attending physician.  Recommendations may be updated based on patient status, additional functional criteria and insurance authorization.  Follow Up Recommendations Home health PT    Assistance Recommended at Discharge Frequent or constant Supervision/Assistance  Functional Status Assessment Patient has had a recent decline in their functional status and demonstrates the ability to make significant improvements in function in a reasonable and predictable amount of time.  Equipment Recommendations  None recommended by PT    Recommendations for Other Services       Precautions / Restrictions Precautions Precautions: Fall Restrictions Weight Bearing Restrictions: No      Mobility  Bed Mobility               General bed mobility comments: sitting EoB on entry     Transfers Overall transfer level: Needs assistance Equipment used: Straight cane Transfers: Sit to/from Stand Sit to Stand: Supervision           General transfer comment: supervision for safety    Ambulation/Gait Ambulation/Gait assistance: Supervision Gait Distance (Feet): 80 Feet Assistive device: Straight cane Gait Pattern/deviations: Step-through pattern;Decreased step length - right;Decreased step length - left;Trunk flexed;Shuffle;Wide base of support Gait velocity: slowed Gait velocity interpretation: <1.8 ft/sec, indicate of risk for recurrent falls General Gait Details: supervision for slow, shuffling gait, mild instability, no overt LoB         Balance Overall balance assessment: Needs assistance Sitting-balance support: Feet supported;No upper extremity supported Sitting balance-Leahy Scale: Normal     Standing balance support: Single extremity supported;During functional activity;No upper extremity supported Standing balance-Leahy Scale: Fair Standing balance comment: able to static stand to put mask on without UE support                             Pertinent Vitals/Pain Pain Assessment: No/denies pain    Home Living Family/patient expects to be discharged to:: Private residence Living Arrangements: Spouse/significant other Available Help at Discharge: Available 24 hours/day;Neighbor Type of Home: House Home Access: Stairs to enter Entrance Stairs-Rails: Psychiatric nurse of Steps: 2   Home Layout: One level Home Equipment: Tub bench;Grab bars - tub/shower;Hand held shower head;Rolling Walker (2 wheels);Cane - single point      Prior Function Prior Level of Function : Driving;Needs assist;History of Falls (last six months)  Extremity/Trunk Assessment   Upper Extremity Assessment Upper Extremity Assessment: Overall WFL for tasks assessed    Lower Extremity Assessment Lower  Extremity Assessment: Generalized weakness;RLE deficits/detail;LLE deficits/detail RLE Deficits / Details: edematous, compression stockings in place LLE Deficits / Details: edematous, compression stockings in place       Communication   Communication: No difficulties  Cognition Arousal/Alertness: Awake/alert Behavior During Therapy: WFL for tasks assessed/performed Overall Cognitive Status: Within Functional Limits for tasks assessed                                          General Comments General comments (skin integrity, edema, etc.): Pt on 2L O2 via [place] throughout session, at rest 98%O2 with ambulation 90-92%O2, vc for purse lip breathing        Assessment/Plan    PT Assessment Patient needs continued PT services  PT Problem List Decreased strength;Decreased activity tolerance;Decreased balance;Decreased mobility;Cardiopulmonary status limiting activity       PT Treatment Interventions DME instruction;Gait training;Functional mobility training;Stair training;Therapeutic activities;Therapeutic exercise;Balance training;Cognitive remediation;Patient/family education    PT Goals (Current goals can be found in the Care Plan section)  Acute Rehab PT Goals Patient Stated Goal: get home to dog Lexie PT Goal Formulation: With patient/family Time For Goal Achievement: 05/19/21 Potential to Achieve Goals: Fair    Frequency Min 3X/week    AM-PAC PT "6 Clicks" Mobility  Outcome Measure Help needed turning from your back to your side while in a flat bed without using bedrails?: None Help needed moving from lying on your back to sitting on the side of a flat bed without using bedrails?: None Help needed moving to and from a bed to a chair (including a wheelchair)?: None Help needed standing up from a chair using your arms (e.g., wheelchair or bedside chair)?: A Little Help needed to walk in hospital room?: None Help needed climbing 3-5 steps with a railing? : A  Little 6 Click Score: 22    End of Session Equipment Utilized During Treatment: Oxygen Activity Tolerance: Patient tolerated treatment well Patient left: in bed;with call bell/phone within reach;with family/visitor present;with bed alarm set Nurse Communication: Mobility status PT Visit Diagnosis: Other abnormalities of gait and mobility (R26.89);Muscle weakness (generalized) (M62.81);Difficulty in walking, not elsewhere classified (R26.2)    Time: 7829-5621 PT Time Calculation (min) (ACUTE ONLY): 24 min   Charges:   PT Evaluation $PT Eval Moderate Complexity: 1 Mod PT Treatments $Gait Training: 8-22 mins        Iain Sawchuk B. Migdalia Dk PT, DPT Acute Rehabilitation Services Pager [phone] Office [phone]   [place] 05/05/2021, 1:22 PM

## 2021-05-05 NOTE — Progress Notes (Addendum)
Mobility Specialist Progress Note    05/05/21 1155  Mobility  Activity Ambulated in hall  Level of Assistance Modified independent, requires aide device or extra time  Assistive Device [facility] Ambulated (ft) 49 ft  Mobility Ambulated with assistance in hallway  Mobility Response Tolerated well  Mobility performed by Mobility specialist  $Mobility charge 1 Mobility   Pt received sitting EOB and agreeable. Had successful void in urinal. Felt some labored breathing upon return to room. Returned to sitting EOB with call bell in reach. Ambulated on 2LO2.   Hildred Alamin Mobility Specialist  Mobility Specialist Phone: [phone]

## 2021-05-05 NOTE — Interval H&P Note (Signed)
Cath Lab Visit (complete for each Cath Lab visit)  Clinical Evaluation Leading to the Procedure:   ACS: No.  Non-ACS:    Anginal Classification: No Symptoms  Anti-ischemic medical therapy: No Therapy  Non-Invasive Test Results: No non-invasive testing performed  Prior CABG: Previous CABG      History and Physical Interval Note:  05/05/2021 3:10 PM  Tommy Riley  has presented today for surgery, with the diagnosis of HF.  The various methods of treatment have been discussed with the patient and family. After consideration of risks, benefits and other options for treatment, the patient has consented to  Procedure(s): RIGHT/LEFT HEART CATH AND CORONARY/GRAFT ANGIOGRAPHY (N/A) as a surgical intervention.  The patient's history has been reviewed, patient examined, no change in status, stable for surgery.  I have reviewed the patient's chart and labs.  Questions were answered to the patient's satisfaction.     Quay Burow

## 2021-05-05 NOTE — Progress Notes (Signed)
63fr sheath aspirated and removed from right femoral artery. Manual pressure applied for 20 minutes. Site level 0, no S+S of hematoma. Tegaderm dressing applied, bedrest instructions given.   Bilateral dp pulses present with doppler.    Bedrest begins at 17:15:00

## 2021-05-05 NOTE — Care Management Important Message (Deleted)
Important Message  Patient Details  Name: DACOTA RUBEN MRN: [id number] Date of Birth: 1945-11-15   Medicare Important Message Given:  Yes     Shelda Altes 05/05/2021, 11:45 AM

## 2021-05-05 NOTE — H&P (View-Only) (Signed)
Progress Note  Patient Name: Tommy Riley Date of Encounter: 05/05/2021  Primary Cardiologist: Rozann Lesches, MD   Subjective   Patient seen and examined at his bedside patient was seen and examined at his bedside.  He offers no complaints at this time.  He is aware of a left heart catheterization this morning.  He is looking forward to it.  He is happy for the care that he have received since being in [facility].  Inpatient Medications    Scheduled Meds:  albuterol  2.5 mg Nebulization BID   aspirin  81 mg Oral Daily   dapagliflozin propanediol  10 mg Oral Daily   digoxin  0.125 mg Oral Daily   ezetimibe  10 mg Oral Daily   feeding supplement  237 mL Oral BID BM   fluticasone  1 spray Each Nare QHS   fluticasone furoate-vilanterol  1 puff Inhalation Daily   And   umeclidinium bromide  1 puff Inhalation Daily   folic acid  397 mcg Oral Daily   furosemide  80 mg Intravenous TID with meals   guaiFENesin  1,200 mg Oral BID   insulin aspart  0-20 Units Subcutaneous TID WC   insulin aspart  15 Units Subcutaneous QPM   insulin detemir  30 Units Subcutaneous QHS   loratadine  10 mg Oral Daily   metoprolol  200 mg Oral Daily   omega-3 acid ethyl esters  2 g Oral BID   potassium chloride  40 mEq Oral BID   rosuvastatin  40 mg Oral QHS   sacubitril-valsartan  1 tablet Oral BID   sertraline  150 mg Oral Daily   sodium chloride flush  3 mL Intravenous Q12H   sodium chloride flush  3 mL Intravenous Q12H   spironolactone  12.5 mg Oral Daily   Continuous Infusions:  sodium chloride     sodium chloride Stopped (05/05/21 0648)   sodium chloride 10 mL/hr at 05/05/21 0648   PRN Meds: sodium chloride, sodium chloride, acetaminophen, albuterol, ondansetron (ZOFRAN) IV, sodium chloride flush, sodium chloride flush, zolpidem   Vital Signs    Vitals:   05/05/21 0746 05/05/21 0751 05/05/21 0815 05/05/21 0818  BP:   132/70   Pulse:   80 88  Resp:      Temp:       TempSrc:      SpO2: 99% 94%    Weight:      Height:        Intake/Output Summary (Last 24 hours) at 05/05/2021 1147 Last data filed at 05/05/2021 0600 Gross per 24 hour  Intake --  Output 2600 ml  Net -2600 ml   Filed Weights   05/03/21 0427 05/04/21 0500 05/05/21 0614  Weight: 129.8 kg 129.9 kg 129.5 kg    Telemetry    Atrial fibrillation with frequent PVCs- Personally Reviewed  ECG    No new EKG today- Personally Reviewed  Physical Exam   GEN: No acute distress.   Neck: No JVD Cardiac: RRR, no murmurs, rubs, or gallops.  Respiratory: Clear to auscultation bilaterally. GI: Soft, nontender, non-distended  MS: Trace bilateral ankle edema; No deformity. Neuro:  Nonfocal  Psych: Normal affect   Labs    Chemistry Recent Labs  Lab 05/01/21 0304 05/01/21 1336 05/03/21 0224 05/04/21 0313 05/05/21 0206  NA 141   < > 141 141 141  K 2.7*   < > 3.4* 3.3* 3.5  CL 98   < > 100 99 98  CO2 36*   < > 33* 33* 32  GLUCOSE 189*   < > 148* 166* 175*  BUN 21   < > 28* 29* 27*  CREATININE 1.31*   < > 1.21 1.20 1.24  CALCIUM 8.7*   < > 9.0 9.4 9.1  PROT 5.6*  --   --   --   --   ALBUMIN 3.0*  --   --   --   --   AST 41  --   --   --   --   ALT 43  --   --   --   --   ALKPHOS 81  --   --   --   --   BILITOT 1.8*  --   --   --   --   GFRNONAA 58*   < > >60 >60 >60  ANIONGAP 7   < > 8 9 11    < > = values in this interval not displayed.     Hematology Recent Labs  Lab 05/05/21 0725  WBC 3.9*  RBC 4.45  HGB 13.4  HCT 43.3  MCV 97.3  MCH 30.1  MCHC 30.9  RDW 16.9*  PLT 154    Cardiac EnzymesNo results for input(s): TROPONINI in the last 168 hours. No results for input(s): TROPIPOC in the last 168 hours.   BNP Recent Labs  Lab 05/01/21 0304  BNP 498.7*     DDimer No results for input(s): DDIMER in the last 168 hours.   Radiology    No results found.  Cardiac Studies     Echo 04/14/21 TTE IMPRESSIONS   1. Left ventricular ejection fraction, by  estimation, is 35 to 40%. The  left ventricle has moderately decreased function. The left ventricle  demonstrates global hypokinesis. There is mild left ventricular  hypertrophy. Left ventricular diastolic  parameters are indeterminate. In Afib with RVR during study, consider  repeat limited echo to better evaluate systolic function once rates better  controlled.   2. Right ventricular systolic function is mildly reduced. The right  ventricular size is normal. There is severely elevated pulmonary artery  systolic pressure. The estimated right ventricular systolic pressure is  70.6 mmHg.   3. Left atrial size was severely dilated.   4. Right atrial size was severely dilated.   5. The mitral valve is normal in structure. Trivial mitral valve  regurgitation.   6. The aortic valve is tricuspid. Aortic valve regurgitation is not  visualized. No aortic stenosis is present.   7. The inferior vena cava is dilated in size with <50% respiratory  variability, suggesting right atrial pressure of 15 mmHg.   FINDINGS   Left Ventricle: Left ventricular ejection fraction, by estimation, is 35  to 40%. The left ventricle has moderately decreased function. The left  ventricle demonstrates global hypokinesis. Definity contrast agent was  given IV to delineate the left  ventricular endocardial borders. The left ventricular internal cavity size  was normal in size. There is mild left ventricular hypertrophy. Left  ventricular diastolic parameters are indeterminate.   Right Ventricle: The right ventricular size is normal. Right vetricular  wall thickness was not well visualized. Right ventricular systolic  function is mildly reduced. There is severely elevated pulmonary artery  systolic pressure. The tricuspid  regurgitant velocity is 3.57 m/s, and with an assumed right atrial  pressure of 15 mmHg, the estimated right ventricular systolic pressure is  23.7 mmHg.   Left Atrium: Left atrial size was  severely dilated.   Right Atrium: Right atrial size was severely dilated.   Pericardium: There is no evidence of pericardial effusion.   Mitral Valve: The mitral valve is normal in structure. Trivial mitral  valve regurgitation. MV peak gradient, 8.2 mmHg. The mean mitral valve  gradient is 4.0 mmHg.   Tricuspid Valve: The tricuspid valve is normal in structure. Tricuspid  valve regurgitation is trivial.   Aortic Valve: The aortic valve is tricuspid. Aortic valve regurgitation is  not visualized. No aortic stenosis is present. Aortic valve mean gradient  measures 3.7 mmHg. Aortic valve peak gradient measures 7.9 mmHg. Aortic  valve area, by VTI measures 2.55  cm.   Pulmonic Valve: The pulmonic valve was not well visualized. Pulmonic valve  regurgitation is not visualized.   Aorta: The aortic root is normal in size and structure.   Venous: The inferior vena cava is dilated in size with less than 50%  respiratory variability, suggesting right atrial pressure of 15 mmHg.   IAS/Shunts: The interatrial septum was not well visualized  Patient Profile     75 y.o. male with past medical history of permanent atrial fibrillation and flutter status post ablation in 2011 at [facility] now back in atrial fibrillation with mild QRS, coronary artery disease, chronic systolic heart failure, CKD 3, DM-2, COPD, hypertension and morbid obesity. Recent acute systolic and diastolic CHF- admitted from OV with Dr. Quentin Ore with acute CHF.  He felt he was urinating more on torsemide.   Assessment & Plan    Acute exacerbation of heart failure -improving total output over the last 24 hours was 2400 mL.  He still needs to be diuresed more, we will continue patient on his current diuretic doses of Lasix 80 mg 3 times daily.  But holding for his left heart catheterization today.  We will continue to replete his potassium to keep this above 4. Continue on lasix 80 mg IV 3 times daily, Farxiga 10 mg daily,  digoxin 0.125 mg daily, Toprol-XL 200 mg daily, Entresto 24-26 mg twice daily and spironolactone 12.5 mg daily.  recent echocardiogram showed moderate depressed ejection fraction 35 to 40% with mild LVH.  There is evidence of severe elevated pulmonary artery pressures we will understand more about his right-sided pressures after his right heart catheterization.  Suspect his pulmonary hypertension is mixed (with class II and III) .he may need outpatient follow-up with the heart failure clinic for this.  Plan for right and left heart catheterization today.  Spoke with the patient about this.  He is agreeable to proceed. The patient understands that risks include but are not limited to stroke (1 in 1000), death (1 in 39), kidney failure [usually temporary] (1 in 500), bleeding (1 in 200), allergic reaction [possibly serious] (1 in 200), and agrees to proceed.  Permanent atrial fibrillation-he is rate controlled but A. fib.  He was seen by Dr. Lovena Le yesterday no further plans for rhythm control.  We will continue his anticoagulation and his rate control agents.  CAD history of CABG-due to significant depressed ejection fraction plan for  OSA continue his CPAP.  Diabetes mellitus continue the patient on his insulin regimen.  For questions or updates, please contact [place] Please consult [email or web address] for contact info under Cardiology/STEMI.      Signed, Berniece Salines, DO  05/05/2021, 11:47 AM

## 2021-05-05 NOTE — Progress Notes (Addendum)
Electrophysiology Rounding Note  Patient Name: Tommy Riley Date of Encounter: 05/06/2021  Primary Cardiologist: Rozann Lesches, MD Electrophysiologist: Vickie Epley, MD   Subjective   No current complaints. Anxious for a plan.   Inpatient Medications    Scheduled Meds:  albuterol  2.5 mg Nebulization BID   aspirin  81 mg Oral Daily   dapagliflozin propanediol  10 mg Oral Daily   digoxin  0.125 mg Oral Daily   ezetimibe  10 mg Oral Daily   feeding supplement  237 mL Oral BID BM   fluticasone  1 spray Each Nare QHS   fluticasone furoate-vilanterol  1 puff Inhalation Daily   And   umeclidinium bromide  1 puff Inhalation Daily   folic acid  638 mcg Oral Daily   furosemide  80 mg Intravenous TID with meals   guaiFENesin  1,200 mg Oral BID   insulin aspart  0-20 Units Subcutaneous TID WC   insulin aspart  15 Units Subcutaneous QPM   insulin detemir  30 Units Subcutaneous QHS   loratadine  10 mg Oral Daily   metoprolol  200 mg Oral Daily   omega-3 acid ethyl esters  2 g Oral BID   potassium chloride  40 mEq Oral BID   rosuvastatin  40 mg Oral QHS   sacubitril-valsartan  1 tablet Oral BID   sertraline  150 mg Oral Daily   sodium chloride flush  3 mL Intravenous Q12H   sodium chloride flush  3 mL Intravenous Q12H   sodium chloride flush  3 mL Intravenous Q12H   spironolactone  12.5 mg Oral Daily   Continuous Infusions:  sodium chloride     sodium chloride     PRN Meds: sodium chloride, sodium chloride, acetaminophen, albuterol, ondansetron (ZOFRAN) IV, sodium chloride flush, sodium chloride flush, zolpidem   Vital Signs    Vitals:   05/05/21 2219 05/06/21 0132 05/06/21 0622 05/06/21 0737  BP: (!) 145/78 (!) 154/82 118/73 (!) 108/54  Pulse: 69 87 92 78  Resp: 20 (!) 23 20 18   Temp: 99.5 F (37.5 C) 98.7 F (37.1 C) 98.2 F (36.8 C) 98.2 F (36.8 C)  TempSrc: Oral Axillary Oral Oral  SpO2: 94% 98% 98% 97%  Weight:   130.5 kg   Height:         Intake/Output Summary (Last 24 hours) at 05/06/2021 0846 Last data filed at 05/06/2021 7564 Gross per 24 hour  Intake 82.55 ml  Output 910 ml  Net -827.45 ml   Filed Weights   05/04/21 0500 05/05/21 0614 05/06/21 0622  Weight: 129.9 kg 129.5 kg 130.5 kg    Physical Exam    GEN- The patient is well appearing, alert and oriented x 3 today.   Head- normocephalic, atraumatic Eyes-  Sclera clear, conjunctiva pink Ears- hearing intact Oropharynx- clear Neck- supple Lungs- Clear to ausculation bilaterally, normal work of breathing Heart- Regular rate and rhythm, no murmurs, rubs or gallops GI- soft, NT, ND, + BS Extremities- no clubbing or cyanosis. No edema Skin- no rash or lesion Psych- euthymic mood, full affect Neuro- strength and sensation are intact  Labs    CBC Recent Labs    05/05/21 0725 05/05/21 1543 05/05/21 1546 05/06/21 0238  WBC 3.9*  --   --  5.1  HGB 13.4   < > 12.6* 12.6*  HCT 43.3   < > 37.0* 40.7  MCV 97.3  --   --  95.8  PLT 154  --   --  139*   < > = values in this interval not displayed.   Basic Metabolic Panel Recent Labs    05/05/21 0206 05/05/21 1543 05/05/21 1546 05/06/21 0238  NA 141   < > 142 137  K 3.5   < > 3.7 3.8  CL 98  --   --  97*  CO2 32  --   --  29  GLUCOSE 175*  --   --  171*  BUN 27*  --   --  25*  CREATININE 1.24  --   --  1.22  CALCIUM 9.1  --   --  8.8*  MG  --   --   --  2.1   < > = values in this interval not displayed.   Liver Function Tests No results for input(s): AST, ALT, ALKPHOS, BILITOT, PROT, ALBUMIN in the last 72 hours. No results for input(s): LIPASE, AMYLASE in the last 72 hours. Cardiac Enzymes No results for input(s): CKTOTAL, CKMB, CKMBINDEX, TROPONINI in the last 72 hours.   Telemetry    AF 70-80s with PVCs (personally reviewed)  Radiology    CARDIAC CATHETERIZATION  Result Date: 05/05/2021 Images from the original result were not included.   Ost RCA to Prox RCA lesion is 100%  stenosed.   2nd Mrg lesion is 100% stenosed.   Mid LAD lesion is 95% stenosed.   There is moderate left ventricular systolic dysfunction.   LV end diastolic pressure is moderately elevated.   The left ventricular ejection fraction is 35-45% by visual estimate. Tommy Riley is a 75 y.o. male  [id number] LOCATION:  FACILITY: [facility] PHYSICIAN: Quay Burow, M.D. 09-Mar-1946 DATE OF PROCEDURE:  05/05/2021 DATE OF DISCHARGE: CARDIAC CATHETERIZATION History obtained from chart review.75 y.o. male with past medical history of permanent atrial fibrillation and flutter status post ablation in 2011 at [facility] now back in atrial fibrillation with mild QRS, coronary artery disease, chronic systolic heart failure, CKD 3, DM-2, COPD, hypertension and morbid obesity. Recent acute systolic and diastolic CHF- admitted from OV with Dr. Quentin Ore with acute CHF.  He felt he was urinating more on torsemide.  He had coronary bypass grafting x3 at [facility] in [place].  He was referred for right left heart cath to define his anatomy and physiology. HEMODYNAMICS:  1: Right atrial pressure-15/17 2: Right ventricular pressure-68/7 3: Pulmonary artery [id number] systolic, mean 40 4: [id number] 5: Cardiac output-4.9 L/min with an index of 2 L/min/m by Fick   Mr. [name] has patent grafts including a vein to the PDA and a Y graft originating from the internal mammary artery to the distal LAD and distal circumflex marginal branch.  He does have pulmonary hypertension.  His right common femoral vein was closed with a Mynx closure device.  The right common femoral artery will be manually pulled and held.  The anatomy has been reviewed with Dr. Lovena Le who is anticipating biventricular pacing (CRT).  The patient left lab in stable condition. Quay Burow. MD, [facility] 05/05/2021 4:35 PM     Patient Profile     Tommy Riley is a 75 y.o. male with a hx of atrial fib and recurrent CHF who is being seen 05/04/2021 for the evaluation  of uncontrolled atrial fib at the request of Dr. Varney Daily.  Assessment & Plan    Acute on chronic systolic heart failure  Cath 05/05/2021 with patent grafts and elevated PA pressures.   Have asked CHF team to see and assist given his continued  symptoms despite GDMT EF 35-40% by Echo and LV-gram. With permanent AF and borderline EF it is not clear if a BiV device would help him despite LBBB.  ? If cMRI would assist in further defining his cardiomyopathy and candidacy for CRT device.  2. CKD II Stable ~ 1.2 despite contrast from cath yesterday.   3. Atrial fibrillation, permanent Rate control varies.  Not rhythm control candidate.   4. Obesity Body mass index is 39.02 kg/m.  Lifestyle modification has been encouraged.   We have asked Heart Failure team to see to help with recommendations. It is unclear if he would benefit from CRT, and is perhaps not clearly indicated given his borderline EF. cMRI could potentially help further define.  ADDENDUM Given continued CHF despite GDMT and issues with AF rate control recently, will plan on proceeding with CRT implantation with AV nodal ablation.  Will obtain cMRI to clarify EF. If <35% CRT-D, if >35% would then likely proceed with CRT-P.   cMRI to be done first thing in the am. Device timing pending lab and staff availability, but potentially tomorrow afternoon.   Risks, benefits, and alternatives to PPM implantation have been explained to the patient at length. These include but are not limited to bleeding, infection, pneumothorax, pericardial effusion, lead dislodgement, heart attack, stroke, or death.  Pt has verbalized understanding and agrees to proceed.  For questions or updates, please contact [place] Please consult [email or web address] for contact info under Cardiology/STEMI.  Signed, Shirley Friar, PA-C  05/06/2021, 8:46 AM   EP Attending  Patient seen and examined. Agree with above. The patient is stable today and  amazingly had no bump in his creatinine. I am concerned about recurrent admits for CHF and have asked our CHF service to give there input. I suspect he has RVR and then gets relatively ischemic as he has patient grafts but severe native disease and then this leads to worsening sob. We will obtain cardiac MRI and plan choice of device based on his EF. Biv PPM vs ICD and if numbers/ecg looks good, consider AV node ablation. I have discussed the indications/risks/benefits/goals/expectations of the procedure and he wishes to proceed.  Carleene Overlie Frederick Marro,MD

## 2021-05-05 NOTE — Progress Notes (Signed)
Heart Failure Nurse Navigator Progress Note  Continuing to follow this admission. EP consulted, possible L/RHC?   Planned HV TOC appt 11/7, will adjust as needed for extended hospitalization.   Pricilla Holm, MSN, RN Heart Failure Nurse Navigator [phone]

## 2021-05-05 NOTE — Care Management Important Message (Signed)
Important Message  Patient Details  Name: Tommy Riley MRN: [id number] Date of Birth: 1946/03/03   Medicare Important Message Given:  Yes     Shelda Altes 05/05/2021, 11:50 AM

## 2021-05-05 NOTE — Progress Notes (Signed)
Progress Note  Patient Name: Tommy Riley Date of Encounter: 05/05/2021  Primary Cardiologist: Rozann Lesches, MD   Subjective   Patient seen and examined at his bedside patient was seen and examined at his bedside.  He offers no complaints at this time.  He is aware of a left heart catheterization this morning.  He is looking forward to it.  He is happy for the care that he have received since being in [facility].  Inpatient Medications    Scheduled Meds:  albuterol  2.5 mg Nebulization BID   aspirin  81 mg Oral Daily   dapagliflozin propanediol  10 mg Oral Daily   digoxin  0.125 mg Oral Daily   ezetimibe  10 mg Oral Daily   feeding supplement  237 mL Oral BID BM   fluticasone  1 spray Each Nare QHS   fluticasone furoate-vilanterol  1 puff Inhalation Daily   And   umeclidinium bromide  1 puff Inhalation Daily   folic acid  793 mcg Oral Daily   furosemide  80 mg Intravenous TID with meals   guaiFENesin  1,200 mg Oral BID   insulin aspart  0-20 Units Subcutaneous TID WC   insulin aspart  15 Units Subcutaneous QPM   insulin detemir  30 Units Subcutaneous QHS   loratadine  10 mg Oral Daily   metoprolol  200 mg Oral Daily   omega-3 acid ethyl esters  2 g Oral BID   potassium chloride  40 mEq Oral BID   rosuvastatin  40 mg Oral QHS   sacubitril-valsartan  1 tablet Oral BID   sertraline  150 mg Oral Daily   sodium chloride flush  3 mL Intravenous Q12H   sodium chloride flush  3 mL Intravenous Q12H   spironolactone  12.5 mg Oral Daily   Continuous Infusions:  sodium chloride     sodium chloride Stopped (05/05/21 0648)   sodium chloride 10 mL/hr at 05/05/21 0648   PRN Meds: sodium chloride, sodium chloride, acetaminophen, albuterol, ondansetron (ZOFRAN) IV, sodium chloride flush, sodium chloride flush, zolpidem   Vital Signs    Vitals:   05/05/21 0746 05/05/21 0751 05/05/21 0815 05/05/21 0818  BP:   132/70   Pulse:   80 88  Resp:      Temp:       TempSrc:      SpO2: 99% 94%    Weight:      Height:        Intake/Output Summary (Last 24 hours) at 05/05/2021 1147 Last data filed at 05/05/2021 0600 Gross per 24 hour  Intake --  Output 2600 ml  Net -2600 ml   Filed Weights   05/03/21 0427 05/04/21 0500 05/05/21 0614  Weight: 129.8 kg 129.9 kg 129.5 kg    Telemetry    Atrial fibrillation with frequent PVCs- Personally Reviewed  ECG    No new EKG today- Personally Reviewed  Physical Exam   GEN: No acute distress.   Neck: No JVD Cardiac: RRR, no murmurs, rubs, or gallops.  Respiratory: Clear to auscultation bilaterally. GI: Soft, nontender, non-distended  MS: Trace bilateral ankle edema; No deformity. Neuro:  Nonfocal  Psych: Normal affect   Labs    Chemistry Recent Labs  Lab 05/01/21 0304 05/01/21 1336 05/03/21 0224 05/04/21 0313 05/05/21 0206  NA 141   < > 141 141 141  K 2.7*   < > 3.4* 3.3* 3.5  CL 98   < > 100 99 98  CO2 36*   < > 33* 33* 32  GLUCOSE 189*   < > 148* 166* 175*  BUN 21   < > 28* 29* 27*  CREATININE 1.31*   < > 1.21 1.20 1.24  CALCIUM 8.7*   < > 9.0 9.4 9.1  PROT 5.6*  --   --   --   --   ALBUMIN 3.0*  --   --   --   --   AST 41  --   --   --   --   ALT 43  --   --   --   --   ALKPHOS 81  --   --   --   --   BILITOT 1.8*  --   --   --   --   GFRNONAA 58*   < > >60 >60 >60  ANIONGAP 7   < > 8 9 11    < > = values in this interval not displayed.     Hematology Recent Labs  Lab 05/05/21 0725  WBC 3.9*  RBC 4.45  HGB 13.4  HCT 43.3  MCV 97.3  MCH 30.1  MCHC 30.9  RDW 16.9*  PLT 154    Cardiac EnzymesNo results for input(s): TROPONINI in the last 168 hours. No results for input(s): TROPIPOC in the last 168 hours.   BNP Recent Labs  Lab 05/01/21 0304  BNP 498.7*     DDimer No results for input(s): DDIMER in the last 168 hours.   Radiology    No results found.  Cardiac Studies     Echo 04/14/21 TTE IMPRESSIONS   1. Left ventricular ejection fraction, by  estimation, is 35 to 40%. The  left ventricle has moderately decreased function. The left ventricle  demonstrates global hypokinesis. There is mild left ventricular  hypertrophy. Left ventricular diastolic  parameters are indeterminate. In Afib with RVR during study, consider  repeat limited echo to better evaluate systolic function once rates better  controlled.   2. Right ventricular systolic function is mildly reduced. The right  ventricular size is normal. There is severely elevated pulmonary artery  systolic pressure. The estimated right ventricular systolic pressure is  78.2 mmHg.   3. Left atrial size was severely dilated.   4. Right atrial size was severely dilated.   5. The mitral valve is normal in structure. Trivial mitral valve  regurgitation.   6. The aortic valve is tricuspid. Aortic valve regurgitation is not  visualized. No aortic stenosis is present.   7. The inferior vena cava is dilated in size with <50% respiratory  variability, suggesting right atrial pressure of 15 mmHg.   FINDINGS   Left Ventricle: Left ventricular ejection fraction, by estimation, is 35  to 40%. The left ventricle has moderately decreased function. The left  ventricle demonstrates global hypokinesis. Definity contrast agent was  given IV to delineate the left  ventricular endocardial borders. The left ventricular internal cavity size  was normal in size. There is mild left ventricular hypertrophy. Left  ventricular diastolic parameters are indeterminate.   Right Ventricle: The right ventricular size is normal. Right vetricular  wall thickness was not well visualized. Right ventricular systolic  function is mildly reduced. There is severely elevated pulmonary artery  systolic pressure. The tricuspid  regurgitant velocity is 3.57 m/s, and with an assumed right atrial  pressure of 15 mmHg, the estimated right ventricular systolic pressure is  42.3 mmHg.   Left Atrium: Left atrial size was  severely dilated.   Right Atrium: Right atrial size was severely dilated.   Pericardium: There is no evidence of pericardial effusion.   Mitral Valve: The mitral valve is normal in structure. Trivial mitral  valve regurgitation. MV peak gradient, 8.2 mmHg. The mean mitral valve  gradient is 4.0 mmHg.   Tricuspid Valve: The tricuspid valve is normal in structure. Tricuspid  valve regurgitation is trivial.   Aortic Valve: The aortic valve is tricuspid. Aortic valve regurgitation is  not visualized. No aortic stenosis is present. Aortic valve mean gradient  measures 3.7 mmHg. Aortic valve peak gradient measures 7.9 mmHg. Aortic  valve area, by VTI measures 2.55  cm.   Pulmonic Valve: The pulmonic valve was not well visualized. Pulmonic valve  regurgitation is not visualized.   Aorta: The aortic root is normal in size and structure.   Venous: The inferior vena cava is dilated in size with less than 50%  respiratory variability, suggesting right atrial pressure of 15 mmHg.   IAS/Shunts: The interatrial septum was not well visualized  Patient Profile     75 y.o. male with past medical history of permanent atrial fibrillation and flutter status post ablation in 2011 at [facility] now back in atrial fibrillation with mild QRS, coronary artery disease, chronic systolic heart failure, CKD 3, DM-2, COPD, hypertension and morbid obesity. Recent acute systolic and diastolic CHF- admitted from OV with Dr. Quentin Ore with acute CHF.  He felt he was urinating more on torsemide.   Assessment & Plan    Acute exacerbation of heart failure -improving total output over the last 24 hours was 2400 mL.  He still needs to be diuresed more, we will continue patient on his current diuretic doses of Lasix 80 mg 3 times daily.  But holding for his left heart catheterization today.  We will continue to replete his potassium to keep this above 4. Continue on lasix 80 mg IV 3 times daily, Farxiga 10 mg daily,  digoxin 0.125 mg daily, Toprol-XL 200 mg daily, Entresto 24-26 mg twice daily and spironolactone 12.5 mg daily.  recent echocardiogram showed moderate depressed ejection fraction 35 to 40% with mild LVH.  There is evidence of severe elevated pulmonary artery pressures we will understand more about his right-sided pressures after his right heart catheterization.  Suspect his pulmonary hypertension is mixed (with class II and III) .he may need outpatient follow-up with the heart failure clinic for this.  Plan for right and left heart catheterization today.  Spoke with the patient about this.  He is agreeable to proceed. The patient understands that risks include but are not limited to stroke (1 in 1000), death (1 in 40), kidney failure [usually temporary] (1 in 500), bleeding (1 in 200), allergic reaction [possibly serious] (1 in 200), and agrees to proceed.  Permanent atrial fibrillation-he is rate controlled but A. fib.  He was seen by Dr. Lovena Le yesterday no further plans for rhythm control.  We will continue his anticoagulation and his rate control agents.  CAD history of CABG-due to significant depressed ejection fraction plan for  OSA continue his CPAP.  Diabetes mellitus continue the patient on his insulin regimen.  For questions or updates, please contact [place] Please consult [email or web address] for contact info under Cardiology/STEMI.      Signed, Berniece Salines, DO  05/05/2021, 11:47 AM

## 2021-05-06 ENCOUNTER — Encounter (HOSPITAL_COMMUNITY): Payer: Self-pay | Admitting: Cardiovascular Disease

## 2021-05-06 DIAGNOSIS — I272 Pulmonary hypertension, unspecified: Secondary | ICD-10-CM

## 2021-05-06 LAB — CBC
HCT: 40.7 % (ref 39.0–52.0)
Hemoglobin: 12.6 g/dL — ABNORMAL LOW (ref 13.0–17.0)
MCH: 29.6 pg (ref 26.0–34.0)
MCHC: 31 g/dL (ref 30.0–36.0)
MCV: 95.8 fL (ref 80.0–100.0)
Platelets: 139 10*3/uL — ABNORMAL LOW (ref 150–400)
RBC: 4.25 MIL/uL (ref 4.22–5.81)
RDW: 16.8 % — ABNORMAL HIGH (ref 11.5–15.5)
WBC: 5.1 10*3/uL (ref 4.0–10.5)
nRBC: 0 % (ref 0.0–0.2)

## 2021-05-06 LAB — BASIC METABOLIC PANEL
Anion gap: 11 (ref 5–15)
BUN: 25 mg/dL — ABNORMAL HIGH (ref 8–23)
CO2: 29 mmol/L (ref 22–32)
Calcium: 8.8 mg/dL — ABNORMAL LOW (ref 8.9–10.3)
Chloride: 97 mmol/L — ABNORMAL LOW (ref 98–111)
Creatinine, Ser: 1.22 mg/dL (ref 0.61–1.24)
GFR, Estimated: >60 mL/min (ref >60)
Glucose, Bld: 171 mg/dL — ABNORMAL HIGH (ref 70–99)
Potassium: 3.8 mmol/L (ref 3.5–5.1)
Sodium: 137 mmol/L (ref 135–145)

## 2021-05-06 LAB — POCT I-STAT EG7
Acid-Base Excess: 11 mmol/L — ABNORMAL HIGH (ref 0.0–2.0)
Bicarbonate: 38.2 mmol/L — ABNORMAL HIGH (ref 20.0–28.0)
Calcium, Ion: 1.19 mmol/L (ref 1.15–1.40)
HCT: 38 % — ABNORMAL LOW (ref 39.0–52.0)
Hemoglobin: 12.9 g/dL — ABNORMAL LOW (ref 13.0–17.0)
O2 Saturation: 60 %
Potassium: 3.8 mmol/L (ref 3.5–5.1)
Sodium: 143 mmol/L (ref 135–145)
TCO2: 40 mmol/L — ABNORMAL HIGH (ref 22–32)
pCO2, Ven: 58.6 mmHg (ref 44.0–60.0)
pH, Ven: 7.423 (ref 7.250–7.430)
pO2, Ven: 32 mmHg (ref 32.0–45.0)

## 2021-05-06 LAB — GLUCOSE, CAPILLARY
Glucose-Capillary: 143 mg/dL — ABNORMAL HIGH (ref 70–99)
Glucose-Capillary: 199 mg/dL — ABNORMAL HIGH (ref 70–99)
Glucose-Capillary: 160 mg/dL — ABNORMAL HIGH (ref 70–99)
Glucose-Capillary: 232 mg/dL — ABNORMAL HIGH (ref 70–99)

## 2021-05-06 LAB — MAGNESIUM: Magnesium: 2.1 mg/dL (ref 1.7–2.4)

## 2021-05-06 MED ORDER — ENOXAPARIN SODIUM 60 MG/0.6ML IJ SOSY
60.0000 mg | PREFILLED_SYRINGE | Freq: Once | INTRAMUSCULAR | Status: AC
  Administered 2021-05-06: 60 mg via SUBCUTANEOUS
  Filled 2021-05-06: qty 0.6

## 2021-05-06 MED ORDER — SODIUM CHLORIDE 0.9 % IV SOLN: INTRAVENOUS | Status: DC

## 2021-05-06 MED ORDER — CEFAZOLIN IN SODIUM CHLORIDE 3-0.9 GM/100ML-% IV SOLN
3.0000 g | INTRAVENOUS | Status: AC
  Administered 2021-05-07: 3 g via INTRAVENOUS
  Filled 2021-05-06: qty 100

## 2021-05-06 MED ORDER — SODIUM CHLORIDE 0.9 % IV SOLN
80.0000 mg | INTRAVENOUS | Status: DC
  Filled 2021-05-06: qty 2

## 2021-05-06 MED ORDER — INSULIN DETEMIR 100 UNIT/ML ~~LOC~~ SOLN
15.0000 [IU] | Freq: Every day | SUBCUTANEOUS | Status: DC
  Administered 2021-05-06 – 2021-05-08 (×3): 15 [IU] via SUBCUTANEOUS
  Filled 2021-05-06 (×4): qty 0.15

## 2021-05-06 MED ORDER — ALPRAZOLAM 0.25 MG PO TABS
0.2500 mg | ORAL_TABLET | Freq: Once | ORAL | Status: AC
  Administered 2021-05-07: 0.25 mg via ORAL
  Filled 2021-05-06: qty 1

## 2021-05-06 MED ORDER — INSULIN ASPART 100 UNIT/ML IJ SOLN
10.0000 [IU] | Freq: Every evening | INTRAMUSCULAR | Status: DC
  Administered 2021-05-06 – 2021-05-09 (×2): 10 [IU] via SUBCUTANEOUS

## 2021-05-06 MED ORDER — ACETAZOLAMIDE 250 MG PO TABS
250.0000 mg | ORAL_TABLET | Freq: Two times a day (BID) | ORAL | Status: DC
  Administered 2021-05-06 – 2021-05-09 (×7): 250 mg via ORAL
  Filled 2021-05-06 (×9): qty 1

## 2021-05-06 MED ORDER — CHLORHEXIDINE GLUCONATE 4 % EX LIQD
60.0000 mL | Freq: Once | CUTANEOUS | Status: AC
  Administered 2021-05-07: 4 via TOPICAL

## 2021-05-06 MED ORDER — METOLAZONE 5 MG PO TABS
2.5000 mg | ORAL_TABLET | Freq: Every day | ORAL | Status: DC
  Administered 2021-05-06 – 2021-05-09 (×4): 2.5 mg via ORAL
  Filled 2021-05-06 (×4): qty 1

## 2021-05-06 MED ORDER — CHLORHEXIDINE GLUCONATE 4 % EX LIQD
60.0000 mL | Freq: Once | CUTANEOUS | Status: AC
  Administered 2021-05-07: 4 via TOPICAL
  Filled 2021-05-06: qty 60

## 2021-05-06 NOTE — Progress Notes (Signed)
Progress Note  Patient Name: Tommy Riley Date of Encounter: 05/06/2021  Primary Cardiologist: Rozann Lesches, MD   Subjective   Patient was seen and examined at his bedside.  He had a lot of questions about his plan of care.  Which I was able to answer during the time of my visit.  He established left heart catheterization which was performed yesterday.  Inpatient Medications    Scheduled Meds:  acetaZOLAMIDE  250 mg Oral BID   albuterol  2.5 mg Nebulization BID   aspirin  81 mg Oral Daily   dapagliflozin propanediol  10 mg Oral Daily   digoxin  0.125 mg Oral Daily   ezetimibe  10 mg Oral Daily   feeding supplement  237 mL Oral BID BM   fluticasone  1 spray Each Nare QHS   fluticasone furoate-vilanterol  1 puff Inhalation Daily   And   umeclidinium bromide  1 puff Inhalation Daily   folic acid  119 mcg Oral Daily   furosemide  80 mg Intravenous TID with meals   guaiFENesin  1,200 mg Oral BID   insulin aspart  0-20 Units Subcutaneous TID WC   insulin aspart  15 Units Subcutaneous QPM   insulin detemir  30 Units Subcutaneous QHS   loratadine  10 mg Oral Daily   metolazone  2.5 mg Oral Daily   metoprolol  200 mg Oral Daily   omega-3 acid ethyl esters  2 g Oral BID   potassium chloride  40 mEq Oral BID   rosuvastatin  40 mg Oral QHS   sacubitril-valsartan  1 tablet Oral BID   sertraline  150 mg Oral Daily   sodium chloride flush  3 mL Intravenous Q12H   sodium chloride flush  3 mL Intravenous Q12H   sodium chloride flush  3 mL Intravenous Q12H   spironolactone  12.5 mg Oral Daily   Continuous Infusions:  sodium chloride     sodium chloride     PRN Meds: sodium chloride, sodium chloride, acetaminophen, albuterol, ondansetron (ZOFRAN) IV, sodium chloride flush, sodium chloride flush, zolpidem   Vital Signs    Vitals:   05/06/21 0622 05/06/21 0737 05/06/21 0900 05/06/21 1126  BP: 118/73 (!) 108/54 120/60 (!) 112/94  Pulse: 92 78 64 76  Resp: 20 18    Temp:  98.2 F (36.8 C) 98.2 F (36.8 C)  97.7 F (36.5 C)  TempSrc: Oral Oral  Oral  SpO2: 98% 97% 97% 91%  Weight: 130.5 kg     Height:        Intake/Output Summary (Last 24 hours) at 05/06/2021 1144 Last data filed at 05/06/2021 0900 Gross per 24 hour  Intake 322.55 ml  Output 910 ml  Net -587.45 ml   Filed Weights   05/04/21 0500 05/05/21 0614 05/06/21 0622  Weight: 129.9 kg 129.5 kg 130.5 kg    Telemetry    Atrial fibrillation with controlled ventricular rate- Personally Reviewed  ECG     None today- Personally Reviewed  Physical Exam   GEN: No acute distress, obese male Neck: No JVD Cardiac: RRR, no murmurs, rubs, or gallops.  Respiratory: Clear to auscultation bilaterally. GI: Soft, nontender, non-distended  MS: Bilateral leg edema +2, No deformity. Neuro:  Nonfocal  Psych: Normal affect   Labs    Chemistry Recent Labs  Lab 05/01/21 0304 05/01/21 1336 05/04/21 0313 05/05/21 0206 05/05/21 1543 05/05/21 1546 05/06/21 0238  NA 141   < > 141 141 142 142 137  K 2.7*   < > 3.3* 3.5 3.8 3.7 3.8  CL 98   < > 99 98  --   --  97*  CO2 36*   < > 33* 32  --   --  29  GLUCOSE 189*   < > 166* 175*  --   --  171*  BUN 21   < > 29* 27*  --   --  25*  CREATININE 1.31*   < > 1.20 1.24  --   --  1.22  CALCIUM 8.7*   < > 9.4 9.1  --   --  8.8*  PROT 5.6*  --   --   --   --   --   --   ALBUMIN 3.0*  --   --   --   --   --   --   AST 41  --   --   --   --   --   --   ALT 43  --   --   --   --   --   --   ALKPHOS 81  --   --   --   --   --   --   BILITOT 1.8*  --   --   --   --   --   --   GFRNONAA 58*   < > >60 >60  --   --  >60  ANIONGAP 7   < > 9 11  --   --  11   < > = values in this interval not displayed.     Hematology Recent Labs  Lab 05/05/21 0725 05/05/21 1543 05/05/21 1546 05/06/21 0238  WBC 3.9*  --   --  5.1  RBC 4.45  --   --  4.25  HGB 13.4 12.9* 12.6* 12.6*  HCT 43.3 38.0* 37.0* 40.7  MCV 97.3  --   --  95.8  MCH 30.1  --   --  29.6  MCHC  30.9  --   --  31.0  RDW 16.9*  --   --  16.8*  PLT 154  --   --  139*    Cardiac EnzymesNo results for input(s): TROPONINI in the last 168 hours. No results for input(s): TROPIPOC in the last 168 hours.   BNP Recent Labs  Lab 05/01/21 0304  BNP 498.7*     DDimer No results for input(s): DDIMER in the last 168 hours.   Radiology    CARDIAC CATHETERIZATION  Result Date: 05/05/2021 Images from the original result were not included.   Ost RCA to Prox RCA lesion is 100% stenosed.   2nd Mrg lesion is 100% stenosed.   Mid LAD lesion is 95% stenosed.   There is moderate left ventricular systolic dysfunction.   LV end diastolic pressure is moderately elevated.   The left ventricular ejection fraction is 35-45% by visual estimate. Tommy Riley is a 75 y.o. male  [id number] LOCATION:  FACILITY: [facility] PHYSICIAN: Tommy Riley, M.D. 05-Jul-1946 DATE OF PROCEDURE:  05/05/2021 DATE OF DISCHARGE: CARDIAC CATHETERIZATION History obtained from chart review.75 y.o. male with past medical history of permanent atrial fibrillation and flutter status post ablation in 2011 at [facility] now back in atrial fibrillation with mild QRS, coronary artery disease, chronic systolic heart failure, CKD 3, DM-2, COPD, hypertension and morbid obesity. Recent acute systolic and diastolic CHF- admitted from OV with Dr. Quentin Ore with acute CHF.  He felt he  was urinating more on torsemide.  He had coronary bypass grafting x3 at [facility] in [place].  He was referred for right left heart cath to define his anatomy and physiology. HEMODYNAMICS:  1: Right atrial pressure-15/17 2: Right ventricular pressure-68/7 3: Pulmonary artery [id number] systolic, mean 40 4: [id number] 5: Cardiac output-4.9 L/min with an index of 2 L/min/m by Fick   [name] has patent grafts including a vein to the PDA and a Y graft originating from the internal mammary artery to the distal LAD and distal circumflex marginal branch.  He does have  pulmonary hypertension.  His right common femoral vein was closed with a Mynx closure device.  The right common femoral artery will be manually pulled and held.  The anatomy has been reviewed with Dr. Lovena Le who is anticipating biventricular pacing (CRT).  The patient left lab in stable condition. Tommy Riley. MD, [facility] 05/05/2021 4:35 PM     Cardiac Studies   Right and left heart cath 05/05/2021  Ost RCA to Prox RCA lesion is 100% stenosed.   2nd Mrg lesion is 100% stenosed.   Mid LAD lesion is 95% stenosed.   There is moderate left ventricular systolic dysfunction.   LV end diastolic pressure is moderately elevated.   The left ventricular ejection fraction is 35-45% by visual estimate.     HEMODYNAMICS:    1: Right atrial pressure-15/17 2: Right ventricular pressure-68/7 3: Pulmonary artery [id number] systolic, mean 40 4: [id number]-02 5: Cardiac output-4.9 L/min with an index of 2 L/min/m by Fick   IMPRESSION: [name] has patent grafts including a vein to the PDA and a Y graft originating from the internal mammary artery to the distal LAD and distal circumflex marginal branch.  He does have pulmonary hypertension.  His right common femoral vein was closed with a Mynx closure device.  The right common femoral artery will be manually pulled and held.  The anatomy has been reviewed with Dr. Lovena Le who is anticipating biventricular pacing (CRT).  The patient left lab in stable condition.   Tommy Riley. MD, [facility] 05/05/2021 4:35 PM   Echo 04/14/21 TTE IMPRESSIONS   1. Left ventricular ejection fraction, by estimation, is 35 to 40%. The  left ventricle has moderately decreased function. The left ventricle  demonstrates global hypokinesis. There is mild left ventricular  hypertrophy. Left ventricular diastolic  parameters are indeterminate. In Afib with RVR during study, consider  repeat limited echo to better evaluate systolic function once rates better  controlled.   2. Right  ventricular systolic function is mildly reduced. The right  ventricular size is normal. There is severely elevated pulmonary artery  systolic pressure. The estimated right ventricular systolic pressure is  77.4 mmHg.   3. Left atrial size was severely dilated.   4. Right atrial size was severely dilated.   5. The mitral valve is normal in structure. Trivial mitral valve  regurgitation.   6. The aortic valve is tricuspid. Aortic valve regurgitation is not  visualized. No aortic stenosis is present.   7. The inferior vena cava is dilated in size with <50% respiratory  variability, suggesting right atrial pressure of 15 mmHg.   FINDINGS   Left Ventricle: Left ventricular ejection fraction, by estimation, is 35  to 40%. The left ventricle has moderately decreased function. The left  ventricle demonstrates global hypokinesis. Definity contrast agent was  given IV to delineate the left  ventricular endocardial borders. The left ventricular internal cavity size  was normal in size.  There is mild left ventricular hypertrophy. Left  ventricular diastolic parameters are indeterminate.   Right Ventricle: The right ventricular size is normal. Right vetricular  wall thickness was not well visualized. Right ventricular systolic  function is mildly reduced. There is severely elevated pulmonary artery  systolic pressure. The tricuspid  regurgitant velocity is 3.57 m/s, and with an assumed right atrial  pressure of 15 mmHg, the estimated right ventricular systolic pressure is  24.2 mmHg.   Left Atrium: Left atrial size was severely dilated.   Right Atrium: Right atrial size was severely dilated.   Pericardium: There is no evidence of pericardial effusion.   Mitral Valve: The mitral valve is normal in structure. Trivial mitral  valve regurgitation. MV peak gradient, 8.2 mmHg. The mean mitral valve  gradient is 4.0 mmHg.   Tricuspid Valve: The tricuspid valve is normal in structure. Tricuspid   valve regurgitation is trivial.   Aortic Valve: The aortic valve is tricuspid. Aortic valve regurgitation is  not visualized. No aortic stenosis is present. Aortic valve mean gradient  measures 3.7 mmHg. Aortic valve peak gradient measures 7.9 mmHg. Aortic  valve area, by VTI measures 2.55  cm.   Pulmonic Valve: The pulmonic valve was not well visualized. Pulmonic valve  regurgitation is not visualized.   Aorta: The aortic root is normal in size and structure.   Venous: The inferior vena cava is dilated in size with less than 50%  respiratory variability, suggesting right atrial pressure of 15 mmHg.   IAS/Shunts: The interatrial septum was not well visualized    Patient Profile     75 y.o. male medical history of permanent atrial fibrillation and flutter status post ablation in 2011 at [facility] now back in atrial fibrillation with mild QRS, coronary artery disease, chronic systolic heart failure, CKD 3, DM-2, COPD, hypertension pulmonary hypertension and morbid obesity  Assessment & Plan    Acute exacerbation of heart failure recent echocardiogram showed moderate depressed ejection fraction 35 to 40% with mild LVH. -LVEDP on RHC is 21 - he still need more diuretics.  If his urine output does not improve on the Lasix 80 mg 3 times daily I plan to increase him to 120 mg twice daily IV.  We will continue to replete his potassium to keep this above 4. Continue on lasix 80 mg IV 3 times daily, Farxiga 10 mg daily, digoxin 0.125 mg daily, Toprol-XL 200 mg daily, Entresto 24-26 mg twice daily and spironolactone 12.5 mg daily.   Pulmonary hypertension-recent right heart catheterization ; Right atrial pressure-15/17, Right ventricular pressure-68/7, Pulmonary artery [id number] systolic, mean 40.   Suspect his pulmonary hypertension is mixed (with class II and III). We will get advanced heart failure on board for early evaluation of this patient be  CAD history of CABG-His left heart  catheterization revealed  patent grafts including a vein to the PDA and a Y graft originating from the internal mammary artery to the distal LAD and distal circumflex marginal branch.  No need for any percutaneous intervention.  Continue patient on his aspirin 81 mg daily, Zetia 10 mg daily along with his rosuvastatin 40 mg daily.   Permanent atrial fibrillation-he is rate controlled but A. fib.  He has been seen by EP, there is consideration for biventricular pacing.  But will defer to my EP colleagues for this final decision with the patient.  For now we will continue his anticoagulation and his rate control agents.    OSA continue his CPAP.  Diabetes mellitus continue the patient on his insulin regimen.    DVT prophylaxis-he is on Eliquis CODE STATUS: Full code Disposition: Discharge to home hopefully before the weekend.  For questions or updates, please contact [place] Please consult [email or web address] for contact info under Cardiology/STEMI.      Signed, Berniece Salines, DO  05/06/2021, 11:44 AM

## 2021-05-06 NOTE — Progress Notes (Addendum)
Mobility Specialist Progress Note    05/06/21 1352  Mobility  Activity Ambulated in hall  Level of Assistance Modified independent, requires aide device or extra time  Assistive Device [facility] Ambulated (ft) 110 ft  Mobility Ambulated with assistance in hallway  Mobility Response Tolerated well  Mobility performed by Mobility specialist  $Mobility charge 1 Mobility   Pt received in bed and agreeable. C/o only of some soreness from procedure. Slow pace. Pt returned to sitting EOB with call bell in reach. Ambulated on 4LO2.  Hildred Alamin Mobility Specialist  Mobility Specialist Phone: [phone]

## 2021-05-06 NOTE — Progress Notes (Addendum)
   Pt will be NPO for some time tomorrow so I decreased his levemir to 15 units will need to be increased back up once back on diet. Novolog decreased to 10 also, consider increasing back to 15 units with diet.   Cecilie Kicks, FNP-C [facility]  JIR:[phone] or after 5pm and on weekends call [phone] 05/06/2021.

## 2021-05-06 NOTE — Consult Note (Signed)
Advanced Heart Failure Team Consult Note   Primary Physician: Pomposini, [name], MD PCP-Cardiologist:  Rozann Lesches, MD  Reason for Consultation: A/c systolic HF  HPI:    Tommy Riley is seen today for evaluation of a/c systolic HF at the request of Dr. Lovena Riley with EP. This is a 75 y.o. male with history of chronic systolic HF, ischemic cardiomyopathy, CAD s/p CABG in 2001, permanent atrial fibrillation s/p atrial fibrillation and atrial flutter ablation in 2001, OSA on CPAP, DM II.   Previously seen by Dr. Gilles Chiquito at [facility] for pulmonary hypertension in 2018. Felt to be multifactorial and likely combination of WHO groups 2 and 3.   Admitted to [facility] 10/09-10/15 with a/c HF. Echo with LVEF 35-40%, RV mildly reduced, RVSP 66 mmHg, trivial MR.  Directly admitted from Cardiology clinic on 10/26 with a/c CHF. Has been diuresing with IV lasix 80 mg TID.  Weight essentially unchanged. Is/Os reflect net neg 8L but does not appear accurate.   Has LBBB with wide QRS. Has been seen by EP. It is not certain how much he would benefit from CRT.  L/RHC 10/31 with patent grafts (Y graft from LIMA to distal LAD and distal circumflex marginal branch, vein graft to PDA).   HEMODYNAMICS:  1: Right atrial pressure-15/17 2: Right ventricular pressure-68/7 3: Pulmonary artery [id number] systolic, mean 40 4: [id number] 5: Cardiac output-4.9 L/min with an index of 2 L/min/m by Fick    Review of Systems: [y] = yes, [ ] = no   General: Weight gain [Y]; Weight loss [ ]; Anorexia [ ]; Fatigue [ y]; Fever [ ]; Chills [ ]; Weakness [ ]  Cardiac: Chest pain/pressure [ ]; Resting SOB [Y]; Exertional SOB [Y]; Orthopnea [Y]; Pedal Edema [Y]; Palpitations [ ]; Syncope [ ]; Presyncope [ ]; Paroxysmal nocturnal dyspnea[ ]  Pulmonary: Cough [ ]; Wheezing[ ]; Hemoptysis[ ]; Sputum [ ]; Snoring Blue.Reese ]  GI: Vomiting[ ]; Dysphagia[ ]; Melena[ ]; Hematochezia [ ]; Heartburn[ ]; Abdominal pain [ ];  Constipation [ ]; Diarrhea [ ]; BRBPR [ ]  GU: Hematuria[ ]; Dysuria [ ]; Nocturia[ ]  Vascular: Pain in legs with walking [ ]; Pain in feet with lying flat [ ]; Non-healing sores [ ]; Stroke [ ]; TIA [ ]; Slurred speech [ ];  Neuro: Headaches[ ]; Vertigo[ ]; Seizures[ ]; Paresthesias[ ];Blurred vision [ ]; Diplopia [ ]; Vision changes [ ]  Ortho/Skin: Arthritis Blue.Reese ]; Joint pain Blue.Reese ]; Muscle pain [ ]; Joint swelling [ ]; Back Pain [ ]; Rash [ ]  Psych: Depression[ ]; Anxiety[ ]  Heme: Bleeding problems [ ]; Clotting disorders [ ]; Anemia [ ]  Endocrine: Diabetes [Y]; Thyroid dysfunction[ ]  Home Medications Prior to Admission medications   Medication Sig Start Date End Date Taking? Authorizing Provider  albuterol (PROVENTIL) (2.5 MG/3ML) 0.083% nebulizer solution Take 3 mLs (2.5 mg total) by nebulization every 6 (six) hours as needed for wheezing or shortness of breath. 07/29/18  Yes Byrum, Rose Fillers, MD  albuterol (VENTOLIN HFA) 108 (90 Base) MCG/ACT inhaler USE 2 INHALATIONS BY MOUTH  EVERY 6 HOURS AS NEEDED FOR WHEEZING OR SHORTNESS OF  BREATH 10/07/20  Yes Byrum, Rose Fillers, MD  apixaban (ELIQUIS) 5 MG TABS tablet Take 1 tablet (5 mg total) by mouth 2 (two) times daily. 04/19/21 05/19/21 Yes Arrien, Jimmy Picket, MD  aspirin 81 MG tablet Take 81 mg by mouth daily.   Yes [provider]  b complex vitamins tablet Take 1 tablet by mouth in the morning and at bedtime.   Yes [provider]  Cholecalciferol (VITAMIN D3) 2000 UNITS TABS Take 1 capsule by mouth in the morning and at bedtime.   Yes [provider]  digoxin (LANOXIN) 0.125 MG tablet Take 1 tablet (0.125 mg total) by mouth daily. 04/20/21 05/20/21 Yes Arrien, Jimmy Picket, MD  diphenhydramine-acetaminophen (TYLENOL PM) 25-500 MG TABS tablet Take 1 tablet by mouth at bedtime as needed (sleep).   Yes [provider]  ezetimibe (ZETIA) 10 MG tablet Take 10 mg by mouth daily.   Yes [provider]  FARXIGA 10 MG TABS tablet Take 1 tablet (10 mg total) by mouth daily. 12/27/20  Yes Satira Sark, MD  Fluticasone-Umeclidin-Vilant (TRELEGY ELLIPTA) 100-62.5-25 MCG/INH AEPB Inhale 1 puff into the lungs daily. Patient needs 90 day script with 3 refills. 04/22/21  Yes Young, Tarri Fuller D, MD  folic acid (FOLVITE) 591 MCG tablet Take 400 mcg by mouth daily.   Yes [provider]  guaiFENesin (MUCINEX) 600 MG 12 hr tablet Take 1,200 mg by mouth 2 (two) times daily.   Yes [provider]  HUMALOG KWIKPEN 200 UNIT/ML KwikPen Inject 20 Units into the skin every evening. 11/11/20  Yes [provider]  insulin detemir (LEVEMIR) 100 UNIT/ML injection Inject 30 Units into the skin daily.   Yes [provider]  loratadine (CLARITIN) 10 MG tablet Take 10 mg by mouth daily.   Yes [provider]  MELATONIN PO Take 5 mg by mouth at bedtime as needed (sleep).   Yes [provider]  metoprolol succinate (TOPROL-XL) 200 MG 24 hr tablet Take 1 tablet (200 mg total) by mouth daily. Take with or immediately following a meal. 04/20/21 05/20/21 Yes Arrien, Jimmy Picket, MD  Multiple Vitamins-Minerals (CENTRUM SILVER PO) Take 1 tablet by mouth daily.    Yes [provider]  Nutritional Supplements (COLD AND FLU PO) Take 1 tablet by mouth daily as needed (cough aches).   Yes [provider]  omega-3 acid ethyl esters (LOVAZA) 1 G capsule Take by mouth in the morning, at noon, and at bedtime.   Yes [provider]  oxymetazoline (AFRIN) 0.05 % nasal spray Place 1 spray into both nostrils 2 (two) times daily.   Yes [provider]  rosuvastatin (CRESTOR) 40 MG tablet Take 40 mg by mouth daily.   Yes [provider]  sacubitril-valsartan (ENTRESTO) 24-26 MG Take 1 tablet by mouth 2 (two) times daily. 03/31/21  Yes Satira Sark, MD  SERTRALINE HCL PO Take 150 mg by mouth daily.   Yes [provider]   spironolactone (ALDACTONE) 25 MG tablet Take 0.5 tablets (12.5 mg total) by mouth daily. 04/20/21 05/20/21 Yes Arrien, Jimmy Picket, MD  torsemide (DEMADEX) 20 MG tablet Take 40 mg by mouth 2 (two) times daily.   Yes [provider]  vitamin C (ASCORBIC ACID) 500 MG tablet Take 500 mg by mouth daily.   Yes [provider]  Zinc Methionate 50 MG CAPS Take 1 capsule by mouth daily.   Yes [provider]  ACCU-CHEK AVIVA PLUS test strip  07/09/14   [provider]  Blood Glucose Monitoring Suppl (ONE TOUCH ULTRA 2) W/DEVICE KIT by Does not apply route.    [provider]    Past Medical History: Past Medical History:  Diagnosis Date   Anxiety    Atrial fibrillation and flutter ([place])  Atrial fibrillation ablation 2011 at [facility]   CAD in native artery    Chronic systolic heart failure (HCC)    CKD (chronic kidney disease), stage III ([place])    COPD (chronic obstructive pulmonary disease) ([place])    COVID-22 February 2020   Depression    Essential hypertension    Gout    Hyperlipidemia    Ischemic heart disease    OSA (obstructive sleep apnea)    Stroke [facility]) 2003   Type 2 diabetes mellitus ([place])     Past Surgical History: Past Surgical History:  Procedure Laterality Date   ATRIAL FIBRILLATION ABLATION  2011   [facility]   CATARACT EXTRACTION Bilateral    CORONARY ARTERY BYPASS GRAFT  2001   HERNIA REPAIR Right    RIGHT/LEFT HEART CATH AND CORONARY/GRAFT ANGIOGRAPHY N/A 05/05/2021   Procedure: RIGHT/LEFT HEART CATH AND CORONARY/GRAFT ANGIOGRAPHY;  Surgeon: Lorretta Harp, MD;  Location: [place] CV LAB;  Service: Cardiovascular;  Laterality: N/A;   TONSILLECTOMY AND ADENOIDECTOMY      Family History: Family History  Problem Relation Age of Onset   Raynaud syndrome Mother    Stroke Father    Diabetes Mellitus II Father    Hypertension Father    Hypertension Sister    Diabetes Mellitus II Sister    Stroke Brother    Crohn's  disease Brother    Breast cancer Brother    COPD Brother     Social History: Social History   Socioeconomic History   Marital status: Married    Spouse name: Bonny Egger   Number of children: 3   Years of education: 14   Highest education level: Some college, no degree  Occupational History    Comment: retired  Tobacco Use   Smoking status: Former    Packs/day: 2.50    Years: 50.00    Pack years: 125.00    Types: Cigarettes    Quit date: 06/17/2011    Years since quitting: 9.8   Smokeless tobacco: Never  Vaping Use   Vaping Use: Some days   Start date: 07/06/2017   Last attempt to quit: 04/19/2021   Substances: Flavoring  Substance and Sexual Activity   Alcohol use: No    Comment: 1 beer a month   Drug use: No   Sexual activity: Not on file  Other Topics Concern   Not on file  Social History Narrative   Consumes no caffeine   Social Determinants of Health   Financial Resource Strain: Low Risk    Difficulty of Paying Living Expenses: Not hard at all  Food Insecurity: No Food Insecurity   Worried About Charity fundraiser in the Last Year: Never true   Ran Out of Food in the Last Year: Never true  Transportation Needs: No Transportation Needs   Lack of Transportation (Medical): No   Lack of Transportation (Non-Medical): No  Physical Activity: Not on file  Stress: Not on file  Social Connections: Not on file    Allergies:  Allergies  Allergen Reactions   Codeine Anaphylaxis    Tightness in chest    Objective:    Vital Signs:   Temp:  [97.8 F (36.6 C)-99.5 F (37.5 C)] 98.2 F (36.8 C) (11/01 0737) Pulse Rate:  [38-92] 64 (11/01 0900) Resp:  [12-66] 18 (11/01 0737) BP: (104-154)/(52-82) 120/60 (11/01 0900) SpO2:  [0 %-100 %] 97 % (11/01 0900) Weight:  [130.5 kg] 130.5 kg (11/01 0622) Last BM Date: 05/05/21  Weight change: Filed Weights   05/04/21 0500 05/05/21 0614 05/06/21 0622  Weight: 129.9 kg 129.5 kg 130.5 kg     Intake/Output:   Intake/Output Summary (Last 24 hours) at 05/06/2021 1042 Last data filed at 05/06/2021 0900 Gross per 24 hour  Intake 322.55 ml  Output 910 ml  Net -587.45 ml      Physical Exam    General: Obese male Sitting up on edge of bed. No distress HEENT: normal Neck: supple. + JVD . Carotids 2+ bilat; no bruits. No lymphadenopathy or thyromegaly appreciated. Cor: PMI nondisplaced. Irr Irr No rubs, gallops or murmurs. Lungs: Bibasilar crackles Abdomen: markedl y obese soft, nontender, + distended. No hepatosplenomegaly. No bruits or masses. Good bowel sounds. Extremities: no cyanosis, clubbing, rash, 2-3+ Riley edema, TED hose on Neuro: alert & orientedx3, cranial nerves grossly intact. moves all 4 extremities w/o difficulty. Affect pleasant  Telemetry   Atrial fibrillation, 70s with LBBB (personally reviewed)   Labs   Basic Metabolic Panel: Recent Labs  Lab 05/01/21 0304 05/01/21 1336 05/02/21 0349 05/03/21 0224 05/04/21 0313 05/05/21 0206 05/05/21 1543 05/05/21 1546 05/06/21 0238  NA 141   < > 143 141 141 141 142 142 137  K 2.7*   < > 3.2* 3.4* 3.3* 3.5 3.8 3.7 3.8  CL 98   < > 100 100 99 98  --   --  97*  CO2 36*   < > 36* 33* 33* 32  --   --  29  GLUCOSE 189*   < > 166* 148* 166* 175*  --   --  171*  BUN 21   < > 24* 28* 29* 27*  --   --  25*  CREATININE 1.31*   < > 1.32* 1.21 1.20 1.24  --   --  1.22  CALCIUM 8.7*   < > 9.0 9.0 9.4 9.1  --   --  8.8*  MG 1.9  --   --  2.1  --   --   --   --  2.1   < > = values in this interval not displayed.    Liver Function Tests: Recent Labs  Lab 05/01/21 0304  AST 41  ALT 43  ALKPHOS 81  BILITOT 1.8*  PROT 5.6*  ALBUMIN 3.0*   No results for input(s): LIPASE, AMYLASE in the last 168 hours. No results for input(s): AMMONIA in the last 168 hours.  CBC: Recent Labs  Lab 05/05/21 0725 05/05/21 1543 05/05/21 1546 05/06/21 0238  WBC 3.9*  --   --  5.1  HGB 13.4 12.9* 12.6* 12.6*  HCT 43.3 38.0*  37.0* 40.7  MCV 97.3  --   --  95.8  PLT 154  --   --  139*    Cardiac Enzymes: No results for input(s): CKTOTAL, CKMB, CKMBINDEX, TROPONINI in the last 168 hours.  BNP: BNP (last 3 results) Recent Labs    04/13/21 1724 04/17/21 0646 05/01/21 0304  BNP 305.0* 382.0* 498.7*    ProBNP (last 3 results) No results for input(s): PROBNP in the last 8760 hours.   CBG: Recent Labs  Lab 05/05/21 1140 05/05/21 1644 05/05/21 1837 05/05/21 2223 05/06/21 0734  GLUCAP 133* 132* 128* 188* 143*    Coagulation Studies: No results for input(s): LABPROT, INR in the last 72 hours.   Imaging   CARDIAC CATHETERIZATION  Result Date: 05/05/2021 Images from the original result were not included.   Ost RCA to Prox RCA lesion is 100% stenosed.  2nd Mrg lesion is 100% stenosed.   Mid LAD lesion is 95% stenosed.   There is moderate left ventricular systolic dysfunction.   LV end diastolic pressure is moderately elevated.   The left ventricular ejection fraction is 35-45% by visual estimate. Tommy Riley is a 75 y.o. male  [id number] LOCATION:  FACILITY: [facility] PHYSICIAN: Quay Burow, M.D. June 24, 1946 DATE OF PROCEDURE:  05/05/2021 DATE OF DISCHARGE: CARDIAC CATHETERIZATION History obtained from chart review.75 y.o. male with past medical history of permanent atrial fibrillation and flutter status post ablation in 2011 at [facility] now back in atrial fibrillation with mild QRS, coronary artery disease, chronic systolic heart failure, CKD 3, DM-2, COPD, hypertension and morbid obesity. Recent acute systolic and diastolic CHF- admitted from OV with Dr. Quentin Ore with acute CHF.  He felt he was urinating more on torsemide.  He had coronary bypass grafting x3 at [facility] in [place].  He was referred for right left heart cath to define his anatomy and physiology. HEMODYNAMICS:  1: Right atrial pressure-15/17 2: Right ventricular pressure-68/7 3: Pulmonary artery [id number] systolic, mean 40 4:  [id number] 5: Cardiac output-4.9 L/min with an index of 2 L/min/m by Fick   Tommy Riley has patent grafts including a vein to the PDA and a Y graft originating from the internal mammary artery to the distal LAD and distal circumflex marginal branch.  He does have pulmonary hypertension.  His right common femoral vein was closed with a Mynx closure device.  The right common femoral artery will be manually pulled and held.  The anatomy has been reviewed with Dr. Lovena Riley who is anticipating biventricular pacing (CRT).  The patient left lab in stable condition. Quay Burow. MD, [facility] 05/05/2021 4:35 PM      Medications:     Current Medications:  albuterol  2.5 mg Nebulization BID   aspirin  81 mg Oral Daily   dapagliflozin propanediol  10 mg Oral Daily   digoxin  0.125 mg Oral Daily   ezetimibe  10 mg Oral Daily   feeding supplement  237 mL Oral BID BM   fluticasone  1 spray Each Nare QHS   fluticasone furoate-vilanterol  1 puff Inhalation Daily   And   umeclidinium bromide  1 puff Inhalation Daily   folic acid  465 mcg Oral Daily   furosemide  80 mg Intravenous TID with meals   guaiFENesin  1,200 mg Oral BID   insulin aspart  0-20 Units Subcutaneous TID WC   insulin aspart  15 Units Subcutaneous QPM   insulin detemir  30 Units Subcutaneous QHS   loratadine  10 mg Oral Daily   metoprolol  200 mg Oral Daily   omega-3 acid ethyl esters  2 g Oral BID   potassium chloride  40 mEq Oral BID   rosuvastatin  40 mg Oral QHS   sacubitril-valsartan  1 tablet Oral BID   sertraline  150 mg Oral Daily   sodium chloride flush  3 mL Intravenous Q12H   sodium chloride flush  3 mL Intravenous Q12H   sodium chloride flush  3 mL Intravenous Q12H   spironolactone  12.5 mg Oral Daily    Infusions:  sodium chloride     sodium chloride        Patient Profile   Tommy Riley is a 75 y.o. male with history of chronic systolic and diastolic HF, permanent AF, CAD s/p CABG, CKD, DM. Admitted with second  heart failure exacerbation in the past month.  Assessment/Plan   Acute on chronic systolic HF: -EF 06-23% on echo, RV mildly reduced -Etiology of LV dysfunction unclear - Remains volume overloaded  - Will continue lasix 80 IV tid and give one dose of metolazone and add diamox 250 bid - May need lasix gtt if doesn't respond - Long discussion about need to limit high fluid intake at home - I think this is the majority of his problem  - May benefit from [place] Toprol 200 daily - Consider Farxiga  2. CAD s/p CABG - grafts patent on cath 05/05/21  3. 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 Needs weight loss  4. Chronic AF with LBBB and intermittent rate control  - Has been in AF > 10 years despite previous AF/AFL ablations - Reportedly has had problems with rate control - Now controlled on digoxin and Toprol  - With LBBB and difficult rate control may be candidate for AVN ablation and CRT.  - EP has seen but have not opined on this yet. Will ask them to weigh in with their thoughts - Need to restart Eliquis post cath will discuss timing with EP (I.e if he will get device)  5. OSA - on CPAP  6. Morbid obesity - needs weight loss.   I think main issue for him is poor compliance with fluid intake. Will need more aggressive diuresis and f/u both inpatient and outpatient. May benefit from cardiomems.   D/w EP regarsing possible AVN ablation and CRT if he fails medical management.   Length of Stay: 6  Glori Bickers, MD  12:21 PM  Advanced Heart Failure Team Pager [phone] (M-F; 7a - 5p)  Please contact [facility] for night-coverage after hours (4p -7a ) and weekends on [email or web address]

## 2021-05-07 ENCOUNTER — Encounter (HOSPITAL_COMMUNITY)
Admission: AD | Disposition: A | Discharge status: Home Health | Payer: Self-pay | Source: Home / Self Care | Attending: Internal Medicine

## 2021-05-07 ENCOUNTER — Inpatient Hospital Stay (HOSPITAL_COMMUNITY): Payer: Medicare Other

## 2021-05-07 DIAGNOSIS — I5023 Acute on chronic systolic (congestive) heart failure: Secondary | ICD-10-CM

## 2021-05-07 DIAGNOSIS — I255 Ischemic cardiomyopathy: Secondary | ICD-10-CM

## 2021-05-07 DIAGNOSIS — I5022 Chronic systolic (congestive) heart failure: Secondary | ICD-10-CM

## 2021-05-07 HISTORY — PX: ICD IMPLANT: EP1208

## 2021-05-07 HISTORY — PX: AV NODE ABLATION: EP1193

## 2021-05-07 LAB — BASIC METABOLIC PANEL
Anion gap: 11 (ref 5–15)
BUN: 28 mg/dL — ABNORMAL HIGH (ref 8–23)
CO2: 28 mmol/L (ref 22–32)
Calcium: 8.9 mg/dL (ref 8.9–10.3)
Chloride: 97 mmol/L — ABNORMAL LOW (ref 98–111)
Creatinine, Ser: 1.42 mg/dL — ABNORMAL HIGH (ref 0.61–1.24)
GFR, Estimated: 53 mL/min — ABNORMAL LOW (ref >60)
Glucose, Bld: 165 mg/dL — ABNORMAL HIGH (ref 70–99)
Potassium: 3.7 mmol/L (ref 3.5–5.1)
Sodium: 136 mmol/L (ref 135–145)

## 2021-05-07 LAB — CBC
HCT: 40.9 % (ref 39.0–52.0)
Hemoglobin: 13.1 g/dL (ref 13.0–17.0)
MCH: 29.8 pg (ref 26.0–34.0)
MCHC: 32 g/dL (ref 30.0–36.0)
MCV: 93.2 fL (ref 80.0–100.0)
Platelets: 124 10*3/uL — ABNORMAL LOW (ref 150–400)
RBC: 4.39 MIL/uL (ref 4.22–5.81)
RDW: 16.5 % — ABNORMAL HIGH (ref 11.5–15.5)
WBC: 4.5 10*3/uL (ref 4.0–10.5)
nRBC: 0 % (ref 0.0–0.2)

## 2021-05-07 LAB — GLUCOSE, CAPILLARY
Glucose-Capillary: 130 mg/dL — ABNORMAL HIGH (ref 70–99)
Glucose-Capillary: 133 mg/dL — ABNORMAL HIGH (ref 70–99)
Glucose-Capillary: 138 mg/dL — ABNORMAL HIGH (ref 70–99)
Glucose-Capillary: 172 mg/dL — ABNORMAL HIGH (ref 70–99)

## 2021-05-07 SURGERY — ICD IMPLANT

## 2021-05-07 MED ORDER — HEPARIN (PORCINE) IN NACL 1000-0.9 UT/500ML-% IV SOLN
INTRAVENOUS | Status: DC | PRN
  Administered 2021-05-07: 500 mL

## 2021-05-07 MED ORDER — MIDAZOLAM HCL 5 MG/5ML IJ SOLN
INTRAMUSCULAR | Status: AC
  Filled 2021-05-07: qty 5

## 2021-05-07 MED ORDER — LIDOCAINE HCL 1 % IJ SOLN
INTRAMUSCULAR | Status: AC
  Filled 2021-05-07: qty 60

## 2021-05-07 MED ORDER — FENTANYL CITRATE (PF) 100 MCG/2ML IJ SOLN
INTRAMUSCULAR | Status: DC | PRN
  Administered 2021-05-07 (×4): 12.5 ug via INTRAVENOUS

## 2021-05-07 MED ORDER — ACETAMINOPHEN 325 MG PO TABS: 325.0000 mg | ORAL_TABLET | ORAL | Status: DC | PRN

## 2021-05-07 MED ORDER — LIDOCAINE HCL (PF) 1 % IJ SOLN
INTRAMUSCULAR | Status: DC | PRN
  Administered 2021-05-07: 60 mL via INTRADERMAL

## 2021-05-07 MED ORDER — MIDAZOLAM HCL 5 MG/5ML IJ SOLN
INTRAMUSCULAR | Status: DC | PRN
  Administered 2021-05-07 (×4): 1 mg via INTRAVENOUS

## 2021-05-07 MED ORDER — ONDANSETRON HCL 4 MG/2ML IJ SOLN: 4.0000 mg | Freq: Four times a day (QID) | INTRAMUSCULAR | Status: DC | PRN

## 2021-05-07 MED ORDER — CEFAZOLIN SODIUM-DEXTROSE 1-4 GM/50ML-% IV SOLN
1.0000 g | Freq: Four times a day (QID) | INTRAVENOUS | Status: AC
  Administered 2021-05-07 – 2021-05-08 (×3): 1 g via INTRAVENOUS
  Filled 2021-05-07 (×3): qty 50

## 2021-05-07 MED ORDER — SODIUM CHLORIDE 0.9 % IV SOLN
INTRAVENOUS | Status: AC
  Filled 2021-05-07: qty 2

## 2021-05-07 MED ORDER — FENTANYL CITRATE (PF) 100 MCG/2ML IJ SOLN
INTRAMUSCULAR | Status: AC
  Filled 2021-05-07: qty 2

## 2021-05-07 MED ORDER — SPIRONOLACTONE 25 MG PO TABS
25.0000 mg | ORAL_TABLET | Freq: Every day | ORAL | Status: DC
  Administered 2021-05-07 – 2021-05-09 (×3): 25 mg via ORAL
  Filled 2021-05-07 (×2): qty 1

## 2021-05-07 MED ORDER — SODIUM CHLORIDE 0.9 % IV SOLN
INTRAVENOUS | Status: DC | PRN
  Administered 2021-05-07: 500 mL

## 2021-05-07 MED ORDER — CHLORHEXIDINE GLUCONATE 4 % EX LIQD
CUTANEOUS | Status: AC
  Filled 2021-05-07: qty 15

## 2021-05-07 MED ORDER — HEPARIN (PORCINE) IN NACL 1000-0.9 UT/500ML-% IV SOLN
INTRAVENOUS | Status: AC
  Filled 2021-05-07: qty 500

## 2021-05-07 MED ORDER — CEFAZOLIN SODIUM-DEXTROSE 2-4 GM/100ML-% IV SOLN
INTRAVENOUS | Status: AC
  Filled 2021-05-07: qty 100

## 2021-05-07 MED ORDER — GADOBUTROL 1 MMOL/ML IV SOLN
10.0000 mL | Freq: Once | INTRAVENOUS | Status: AC | PRN
  Administered 2021-05-07: 10 mL via INTRAVENOUS

## 2021-05-07 MED ORDER — POTASSIUM CHLORIDE CRYS ER 20 MEQ PO TBCR: 40.0000 meq | EXTENDED_RELEASE_TABLET | Freq: Once | ORAL | Status: DC

## 2021-05-07 SURGICAL SUPPLY — 21 items
CABLE SURGICAL S-101-97-12 (CABLE) ×3 IMPLANT
CATH ACUITYPRO 45CM EH 9F (CATHETERS) ×1 IMPLANT
CATH ACUITYPRO IC 90 7F (CATHETERS) ×1
CATH GD CS-IC 90 CVD 60X7FR (CATHETERS) IMPLANT
CATH JOSEPH QUAD ALLRED 6F REP (CATHETERS) ×1 IMPLANT
CATH RIGHTSITE C315HIS02 (CATHETERS) ×1 IMPLANT
GUIDEWIRE ANGLED .035X150CM (WIRE) ×1 IMPLANT
ICD MOMENTUM X4 CRT-D G138 (ICD Generator) ×1 IMPLANT
LEAD ACUITY X4 4671 (Lead) ×1 IMPLANT
LEAD RELIANCE 0138-64 (Lead) ×1 IMPLANT
LEAD SELECT SECURE 3830 383069 (Lead) IMPLANT
PACK EP LATEX FREE (CUSTOM PROCEDURE TRAY) ×1
PACK EP LF (CUSTOM PROCEDURE TRAY) ×1 IMPLANT
PAD PRO RADIOLUCENT 2001M-C (PAD) ×2 IMPLANT
SELECT SECURE 3830 383069 (Lead) ×2 IMPLANT
SHEATH 7FR PRELUDE SNAP 13 (SHEATH) ×2 IMPLANT
SHEATH 9.5FR PRELUDE SNAP 13 (SHEATH) ×1 IMPLANT
SHEATH 9FR PRELUDE SNAP 13 (SHEATH) ×1 IMPLANT
SLITTER 6232ADJ (MISCELLANEOUS) ×1 IMPLANT
TRAY PACEMAKER INSERTION (PACKS) ×2 IMPLANT
WIRE ACUITY WHISPER EDS 4648 (WIRE) ×3 IMPLANT

## 2021-05-07 NOTE — Progress Notes (Addendum)
Advanced Heart Failure Rounding Note  PCP-Cardiologist: Rozann Lesches, MD   Subjective:    Feels ok today. Only complaint is being NPO.   Sitting  up on side of bed. Remains edematous but no resting dyspnea. Slept w/ CPAP last night.   Rate controlled Afib, 70s.   3.1L in UOP yesterday. Wt down 4 lb. SCr 1.2>>1.4. K 3.8    Objective:   Weight Range: 128.5 kg Body mass index is 38.44 kg/m.   Vital Signs:   Temp:  [97.4 F (36.3 C)-97.7 F (36.5 C)] 97.6 F (36.4 C) (11/02 0547) Pulse Rate:  [60-82] 82 (11/02 0547) Resp:  [15-20] 19 (11/02 0547) BP: (112-122)/(62-94) 112/69 (11/02 0547) SpO2:  [91 %-100 %] 100 % (11/02 0547) Weight:  [128.5 kg] 128.5 kg (11/02 0547) Last BM Date: 05/06/21  Weight change: Filed Weights   05/05/21 0614 05/06/21 0622 05/07/21 0547  Weight: 129.5 kg 130.5 kg 128.5 kg    Intake/Output:   Intake/Output Summary (Last 24 hours) at 05/07/2021 0948 Last data filed at 05/07/2021 0539 Gross per 24 hour  Intake --  Output 3100 ml  Net -3100 ml      Physical Exam    General:  Well appearing, sitting up on side of be. No resp difficulty HEENT: Normal Neck: Supple. Thick neck, JVD not well visualized . Carotids 2+ bilat; no bruits. No lymphadenopathy or thyromegaly appreciated. Cor: PMI nondisplaced. Irregularly irregular rhythm. No rubs, gallops or murmurs. Lungs: Clear Abdomen: Soft, nontender, nondistended. No hepatosplenomegaly. No bruits or masses. Good bowel sounds. Extremities: No cyanosis, clubbing, rash, 2+ bilateral LE up to knees + TED hoses, Neuro: Alert & orientedx3, cranial nerves grossly intact. moves all 4 extremities w/o difficulty. Affect pleasant   Telemetry   Afib 70s, personally reviewed   EKG    No new EKG to review   Labs    CBC Recent Labs    05/06/21 0238 05/07/21 0302  WBC 5.1 4.5  HGB 12.6* 13.1  HCT 40.7 40.9  MCV 95.8 93.2  PLT 139* 096*   Basic Metabolic Panel Recent Labs     05/06/21 0238 05/07/21 0302  NA 137 136  K 3.8 3.7  CL 97* 97*  CO2 29 28  GLUCOSE 171* 165*  BUN 25* 28*  CREATININE 1.22 1.42*  CALCIUM 8.8* 8.9  MG 2.1  --    Liver Function Tests No results for input(s): AST, ALT, ALKPHOS, BILITOT, PROT, ALBUMIN in the last 72 hours. No results for input(s): LIPASE, AMYLASE in the last 72 hours. Cardiac Enzymes No results for input(s): CKTOTAL, CKMB, CKMBINDEX, TROPONINI in the last 72 hours.  BNP: BNP (last 3 results) Recent Labs    04/13/21 1724 04/17/21 0646 05/01/21 0304  BNP 305.0* 382.0* 498.7*    ProBNP (last 3 results) No results for input(s): PROBNP in the last 8760 hours.   D-Dimer No results for input(s): DDIMER in the last 72 hours. Hemoglobin A1C No results for input(s): HGBA1C in the last 72 hours. Fasting Lipid Panel No results for input(s): CHOL, HDL, LDLCALC, TRIG, CHOLHDL, LDLDIRECT in the last 72 hours. Thyroid Function Tests No results for input(s): TSH, T4TOTAL, T3FREE, THYROIDAB in the last 72 hours.  Invalid input(s): FREET3  Other results:   Imaging    No results found.   Medications:     Scheduled Medications:  acetaZOLAMIDE  250 mg Oral BID   albuterol  2.5 mg Nebulization BID   aspirin  81 mg Oral Daily  chlorhexidine  60 mL Topical Once   dapagliflozin propanediol  10 mg Oral Daily   digoxin  0.125 mg Oral Daily   ezetimibe  10 mg Oral Daily   feeding supplement  237 mL Oral BID BM   fluticasone  1 spray Each Nare QHS   fluticasone furoate-vilanterol  1 puff Inhalation Daily   And   umeclidinium bromide  1 puff Inhalation Daily   folic acid  601 mcg Oral Daily   furosemide  80 mg Intravenous TID with meals   gentamicin irrigation  80 mg Irrigation On Call   guaiFENesin  1,200 mg Oral BID   insulin aspart  0-20 Units Subcutaneous TID WC   insulin aspart  10 Units Subcutaneous QPM   insulin detemir  15 Units Subcutaneous QHS   loratadine  10 mg Oral Daily   metolazone  2.5 mg  Oral Daily   metoprolol  200 mg Oral Daily   omega-3 acid ethyl esters  2 g Oral BID   potassium chloride  40 mEq Oral BID   rosuvastatin  40 mg Oral QHS   sacubitril-valsartan  1 tablet Oral BID   sertraline  150 mg Oral Daily   sodium chloride flush  3 mL Intravenous Q12H   spironolactone  12.5 mg Oral Daily    Infusions:  sodium chloride     sodium chloride     sodium chloride      ceFAZolin (ANCEF) IV      PRN Medications: sodium chloride, acetaminophen, albuterol, ondansetron (ZOFRAN) IV, sodium chloride flush, zolpidem    Patient Profile   Mr. Kozub is a 75 y.o. male with history of chronic systolic and diastolic HF, permanent AF, CAD s/p CABG, CKD, DM. Admitted with second heart failure exacerbation in the past month.  Assessment/Plan   1. Acute on chronic systolic HF: - EF 09-32% on echo, RV mildly reduced - Etiology of LV dysfunction unclear - Remains volume overloaded  - Diuresing but only 3L out despite 80 IV tid metolazone and diamox 250 bid - May need lasix gtt for more aggressive diuresis  - Long discussion about need to limit high fluid intake at home - I think this is the majority of his problem  - May benefit from Cardiomems - Continue Entresto 24-26 mg bid  - Increase spiro to 25 mg daily  - Continue Toprol 200 daily - Consider Farxiga 10 mg daily    2. CAD s/p CABG - grafts patent on cath 05/05/21   3. 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 Needs weight loss   4. Chronic AF with LBBB and intermittent rate control  - Has been in AF > 10 years despite previous AF/AFL ablations - Reportedly has had problems with rate control - Now controlled on digoxin and Toprol  - With LBBB and difficult rate control, plan is for CRT implantation with AV nodal ablation today  - Getting cMRI today to better  quantify EF. If <35% CRT-D, if >35% would then likely proceed with CRT-P.  - Eliquis on hold for device implant.    5. OSA - on CPAP   6. Morbid obesity - needs weight loss.     Length of Stay: [address] [place]  05/07/2021, 9:48 AM  Advanced Heart Failure Team Pager [phone] (  M-F; 7a - 5p)  Please contact [facility] for night-coverage after hours (5p -7a ) and weekends on [email or web address]  Patient seen and examined with the above-signed Advanced Practice Provider and/or Housestaff. I personally reviewed laboratory data, imaging studies and relevant notes. I independently examined the patient and formulated the important aspects of the plan. I have edited the note to reflect any of my changes or salient points. I have personally discussed the plan with the patient and/or family.  Has diuresed modestly. Breathing better. No orthopnea or PND. Preliminary cMRI EF 34%. AF rate controlled. SCr 1.2->1.4  General:  Obese male sitting up No resp difficulty HEENT: normal Neck: supple. JVP hard to see  Carotids 2+ bilat; no bruits. No lymphadenopathy or thryomegaly appreciated. Cor: PMI nondisplaced. Irregular rate & rhythm. No rubs, gallops or murmurs. Lungs: clear Abdomen: obese  soft, nontender, nondistended. No hepatosplenomegaly. No bruits or masses. Good bowel sounds. Extremities: no cyanosis, clubbing, rash, 1-2+ edema Neuro: alert & orientedx3, cranial nerves grossly intact. moves all 4 extremities w/o difficulty. Affect pleasant  He is diuresing slowly. Suspect he has more to give. Need to watch SCr closely. CMRI EF 34%. D/w EP and they are planning CRT-D today (no AVN ablation with controlled AF rate).   Will continue diuresis as tolerated.    Glori Bickers, MD  3:36 PM

## 2021-05-07 NOTE — Progress Notes (Addendum)
Electrophysiology Rounding Note  Patient Name: Tommy Riley Date of Encounter: 05/07/2021  Primary Cardiologist: Rozann Lesches, MD Electrophysiologist: Vickie Epley, MD   Subjective   No resting dyspnea. Excited to have a plan in place and to be getting device. No chest pain.   Inpatient Medications    Scheduled Meds:  acetaZOLAMIDE  250 mg Oral BID   albuterol  2.5 mg Nebulization BID   aspirin  81 mg Oral Daily   chlorhexidine  60 mL Topical Once   chlorhexidine       dapagliflozin propanediol  10 mg Oral Daily   digoxin  0.125 mg Oral Daily   ezetimibe  10 mg Oral Daily   feeding supplement  237 mL Oral BID BM   fluticasone  1 spray Each Nare QHS   fluticasone furoate-vilanterol  1 puff Inhalation Daily   And   umeclidinium bromide  1 puff Inhalation Daily   folic acid  962 mcg Oral Daily   furosemide  80 mg Intravenous TID with meals   gentamicin irrigation  80 mg Irrigation On Call   guaiFENesin  1,200 mg Oral BID   insulin aspart  0-20 Units Subcutaneous TID WC   insulin aspart  10 Units Subcutaneous QPM   insulin detemir  15 Units Subcutaneous QHS   loratadine  10 mg Oral Daily   metolazone  2.5 mg Oral Daily   metoprolol  200 mg Oral Daily   omega-3 acid ethyl esters  2 g Oral BID   potassium chloride  40 mEq Oral BID   rosuvastatin  40 mg Oral QHS   sacubitril-valsartan  1 tablet Oral BID   sertraline  150 mg Oral Daily   sodium chloride flush  3 mL Intravenous Q12H   spironolactone  25 mg Oral Daily   Continuous Infusions:  sodium chloride     sodium chloride     sodium chloride 50 mL/hr at 05/07/21 1029    ceFAZolin (ANCEF) IV     PRN Meds: sodium chloride, acetaminophen, albuterol, ondansetron (ZOFRAN) IV, sodium chloride flush, zolpidem   Vital Signs    Vitals:   05/06/21 2040 05/07/21 0017 05/07/21 0547 05/07/21 1001  BP: 122/62 120/82 112/69 110/67  Pulse: 76 60 82 83  Resp: 20 15 19 19   Temp: (!) 97.4 F (36.3 C) 97.6 F  (36.4 C) 97.6 F (36.4 C)   TempSrc: Oral Oral Oral Oral  SpO2: 96% 93% 100% 96%  Weight:   128.5 kg   Height:        Intake/Output Summary (Last 24 hours) at 05/07/2021 1040 Last data filed at 05/07/2021 0539 Gross per 24 hour  Intake --  Output 3100 ml  Net -3100 ml   Filed Weights   05/05/21 0614 05/06/21 0622 05/07/21 0547  Weight: 129.5 kg 130.5 kg 128.5 kg    Physical Exam    GEN- The patient is well appearing, alert and oriented x 3 today.   Head- normocephalic, atraumatic Eyes-  Sclera clear, conjunctiva pink Ears- hearing intact Oropharynx- clear Neck- supple Lungs- Clear to ausculation bilaterally, normal work of breathing Heart- Irregularly irregular rate and rhythm, no murmurs, rubs or gallops GI- soft, NT, ND, + BS Extremities- no clubbing or cyanosis. No edema Skin- no rash or lesion Psych- euthymic mood, full affect Neuro- strength and sensation are intact  Labs    CBC Recent Labs    05/06/21 0238 05/07/21 0302  WBC 5.1 4.5  HGB 12.6* 13.1  HCT  40.7 40.9  MCV 95.8 93.2  PLT 139* 275*   Basic Metabolic Panel Recent Labs    05/06/21 0238 05/07/21 0302  NA 137 136  K 3.8 3.7  CL 97* 97*  CO2 29 28  GLUCOSE 171* 165*  BUN 25* 28*  CREATININE 1.22 1.42*  CALCIUM 8.8* 8.9  MG 2.1  --    Liver Function Tests No results for input(s): AST, ALT, ALKPHOS, BILITOT, PROT, ALBUMIN in the last 72 hours. No results for input(s): LIPASE, AMYLASE in the last 72 hours. Cardiac Enzymes No results for input(s): CKTOTAL, CKMB, CKMBINDEX, TROPONINI in the last 72 hours.   Telemetry    AF 70-80s, PVCs (personally reviewed)  Radiology    CARDIAC CATHETERIZATION  Result Date: 05/05/2021 Images from the original result were not included.   Ost RCA to Prox RCA lesion is 100% stenosed.   2nd Mrg lesion is 100% stenosed.   Mid LAD lesion is 95% stenosed.   There is moderate left ventricular systolic dysfunction.   LV end diastolic pressure is moderately  elevated.   The left ventricular ejection fraction is 35-45% by visual estimate. BEJAMIN Riley is a 75 y.o. male  [id number] LOCATION:  FACILITY: [facility] PHYSICIAN: Quay Burow, M.D. Jan 08, 1946 DATE OF PROCEDURE:  05/05/2021 DATE OF DISCHARGE: CARDIAC CATHETERIZATION History obtained from chart review.75 y.o. male with past medical history of permanent atrial fibrillation and flutter status post ablation in 2011 at [facility] now back in atrial fibrillation with mild QRS, coronary artery disease, chronic systolic heart failure, CKD 3, DM-2, COPD, hypertension and morbid obesity. Recent acute systolic and diastolic CHF- admitted from OV with Dr. Quentin Ore with acute CHF.  He felt he was urinating more on torsemide.  He had coronary bypass grafting x3 at [facility] in [place].  He was referred for right left heart cath to define his anatomy and physiology. HEMODYNAMICS:  1: Right atrial pressure-15/17 2: Right ventricular pressure-68/7 3: Pulmonary artery [id number] systolic, mean 40 4: [id number] 5: Cardiac output-4.9 L/min with an index of 2 L/min/m by Fick   Mr. [name] has patent grafts including a vein to the PDA and a Y graft originating from the internal mammary artery to the distal LAD and distal circumflex marginal branch.  He does have pulmonary hypertension.  His right common femoral vein was closed with a Mynx closure device.  The right common femoral artery will be manually pulled and held.  The anatomy has been reviewed with Dr. Lovena Le who is anticipating biventricular pacing (CRT).  The patient left lab in stable condition. Quay Burow. MD, [facility] 05/05/2021 4:35 PM     Patient Profile        Tommy Riley is a 75 y.o. male with a hx of atrial fib and recurrent CHF who is being seen 05/04/2021 for the evaluation of uncontrolled atrial fib at the request of Dr. Varney Daily.  Assessment & Plan    Acute on chronic systolic heart failure  Cath 05/05/2021 with patent grafts and  elevated PA pressures.   Have asked CHF team to see and assist given his continued symptoms despite GDMT EF 35-40% by Echo and LV-gram. With permanent AF and borderline EF it is not clear if a BiV device would help him despite LBBB.  cMRI formal read pending. EF 34% per Dr. Gardiner Rhyme. Waiting to confirm with Dr. Lovena Le but will most likely plan for CRT-D.    2. CKD II Cr up slightly at 1.42.  Diuresis on-going.  3. Atrial fibrillation, permanent Rate control varies.  Not rhythm control candidate.  Plan potential AV nodal ablation pending CRT results.   4. Obesity Body mass index is 38.44 Lifestyle modification has been encouraged.    For questions or updates, please contact [facility] Please consult [email or web address] for contact info under Cardiology/STEMI.  Signed, Shirley Friar, PA-C  05/07/2021, 10:40 AM   EP Attending  Patient seen and examined. Agree with above. The patient is stable. Note slight increase in creatinine. His EF is 34% by MRI. We will proceed with biv ICD. I have reviewed the indications and plan to proceed. His rates are well controlled. I will hold off on AV node ablation for now.  Carleene Overlie Cystal Shannahan,MD

## 2021-05-07 NOTE — Progress Notes (Signed)
Heart Failure Navigator Progress Note  Assessed for Heart & Vascular TOC clinic readiness.  Patient does not meet criteria due to AHF rounding team consulted this admission.   Navigator available for reassessment of patient.   Sunjai Levandoski, MSN, RN Heart Failure Nurse Navigator [phone]   

## 2021-05-07 NOTE — Progress Notes (Signed)
PT Cancellation Note  Patient Details Name: Tommy Riley MRN: [id number] DOB: 10-02-1945   Cancelled Treatment:    Reason Eval/Treat Not Completed: Patient at procedure or test/unavailable. Will follow-up as time permits.   Moishe Spice, PT, DPT Acute Rehabilitation Services  Pager: [phone] Office: [place] 05/07/2021, 2:59 PM

## 2021-05-08 ENCOUNTER — Encounter (HOSPITAL_COMMUNITY): Payer: Self-pay | Admitting: Internal Medicine

## 2021-05-08 ENCOUNTER — Inpatient Hospital Stay (HOSPITAL_COMMUNITY): Payer: Medicare Other

## 2021-05-08 LAB — CBC
HCT: 40.7 % (ref 39.0–52.0)
Hemoglobin: 12.9 g/dL — ABNORMAL LOW (ref 13.0–17.0)
MCH: 30 pg (ref 26.0–34.0)
MCHC: 31.7 g/dL (ref 30.0–36.0)
MCV: 94.7 fL (ref 80.0–100.0)
Platelets: 145 10*3/uL — ABNORMAL LOW (ref 150–400)
RBC: 4.3 MIL/uL (ref 4.22–5.81)
RDW: 16.4 % — ABNORMAL HIGH (ref 11.5–15.5)
WBC: 5.2 10*3/uL (ref 4.0–10.5)
nRBC: 0 % (ref 0.0–0.2)

## 2021-05-08 LAB — BASIC METABOLIC PANEL
Anion gap: 10 (ref 5–15)
BUN: 29 mg/dL — ABNORMAL HIGH (ref 8–23)
CO2: 32 mmol/L (ref 22–32)
Calcium: 8.9 mg/dL (ref 8.9–10.3)
Chloride: 98 mmol/L (ref 98–111)
Creatinine, Ser: 1.43 mg/dL — ABNORMAL HIGH (ref 0.61–1.24)
GFR, Estimated: 52 mL/min — ABNORMAL LOW (ref >60)
Glucose, Bld: 148 mg/dL — ABNORMAL HIGH (ref 70–99)
Potassium: 3.4 mmol/L — ABNORMAL LOW (ref 3.5–5.1)
Sodium: 140 mmol/L (ref 135–145)

## 2021-05-08 LAB — GLUCOSE, CAPILLARY
Glucose-Capillary: 147 mg/dL — ABNORMAL HIGH (ref 70–99)
Glucose-Capillary: 216 mg/dL — ABNORMAL HIGH (ref 70–99)
Glucose-Capillary: 191 mg/dL — ABNORMAL HIGH (ref 70–99)
Glucose-Capillary: 271 mg/dL — ABNORMAL HIGH (ref 70–99)

## 2021-05-08 LAB — MAGNESIUM: Magnesium: 2.3 mg/dL (ref 1.7–2.4)

## 2021-05-08 MED ORDER — YOU HAVE A PACEMAKER BOOK
Freq: Once | Status: AC
Start: 2021-05-08 — End: 2021-05-08
  Filled 2021-05-08: qty 1

## 2021-05-08 MED ORDER — POTASSIUM CHLORIDE CRYS ER 20 MEQ PO TBCR
40.0000 meq | EXTENDED_RELEASE_TABLET | Freq: Once | ORAL | Status: AC
  Administered 2021-05-08: 40 meq via ORAL
  Filled 2021-05-08: qty 2

## 2021-05-08 MED ORDER — POTASSIUM CHLORIDE CRYS ER 20 MEQ PO TBCR: 40.0000 meq | EXTENDED_RELEASE_TABLET | Freq: Once | ORAL | Status: DC

## 2021-05-08 NOTE — Progress Notes (Addendum)
Electrophysiology Rounding Note  Patient Name: Tommy Riley Date of Encounter: 05/08/2021  Primary Cardiologist: Rozann Lesches, MD Electrophysiologist: Dr. Lovena Le   Subjective   The patient is doing well today. Anxious to go home, but understands he still needs optimization.   Inpatient Medications    Scheduled Meds:  acetaZOLAMIDE  250 mg Oral BID   albuterol  2.5 mg Nebulization BID   aspirin  81 mg Oral Daily   dapagliflozin propanediol  10 mg Oral Daily   digoxin  0.125 mg Oral Daily   ezetimibe  10 mg Oral Daily   feeding supplement  237 mL Oral BID BM   fluticasone  1 spray Each Nare QHS   fluticasone furoate-vilanterol  1 puff Inhalation Daily   And   umeclidinium bromide  1 puff Inhalation Daily   folic acid  053 mcg Oral Daily   furosemide  80 mg Intravenous TID with meals   guaiFENesin  1,200 mg Oral BID   insulin aspart  0-20 Units Subcutaneous TID WC   insulin aspart  10 Units Subcutaneous QPM   insulin detemir  15 Units Subcutaneous QHS   loratadine  10 mg Oral Daily   metolazone  2.5 mg Oral Daily   metoprolol  200 mg Oral Daily   omega-3 acid ethyl esters  2 g Oral BID   potassium chloride  40 mEq Oral BID   potassium chloride  40 mEq Oral Once   rosuvastatin  40 mg Oral QHS   sacubitril-valsartan  1 tablet Oral BID   sertraline  150 mg Oral Daily   sodium chloride flush  3 mL Intravenous Q12H   spironolactone  25 mg Oral Daily   Continuous Infusions:  sodium chloride     PRN Meds: sodium chloride, acetaminophen, albuterol, ondansetron (ZOFRAN) IV, ondansetron (ZOFRAN) IV, sodium chloride flush, zolpidem   Vital Signs    Vitals:   05/07/21 2003 05/07/21 2154 05/08/21 0106 05/08/21 0521  BP: 125/70  113/70 118/68  Pulse: 80 80 80 80  Resp: 18 20 20 18   Temp: 98.5 F (36.9 C)  98.5 F (36.9 C) 98.6 F (37 C)  TempSrc: Oral  Oral Oral  SpO2: 99% 99% 97% 94%  Weight:    127.5 kg  Height:        Intake/Output Summary (Last 24  hours) at 05/08/2021 0831 Last data filed at 05/08/2021 0645 Gross per 24 hour  Intake 1060.89 ml  Output 4445 ml  Net -3384.11 ml   Filed Weights   05/06/21 0622 05/07/21 0547 05/08/21 0521  Weight: 130.5 kg 128.5 kg 127.5 kg    Physical Exam    GEN- The patient is well appearing, alert and oriented x 3 today.   Head- normocephalic, atraumatic Eyes-  Sclera clear, conjunctiva pink Ears- hearing intact Oropharynx- clear Neck- supple Lungs- Clear to ausculation bilaterally, normal work of breathing Heart- Irregularly irregular rate and rhythm, no murmurs, rubs or gallops GI- soft, NT, ND, + BS Extremities- no clubbing or cyanosis. No edema Skin- no rash or lesion Psych- euthymic mood, full affect Neuro- strength and sensation are intact  Labs    CBC Recent Labs    05/07/21 0302 05/08/21 0245  WBC 4.5 5.2  HGB 13.1 12.9*  HCT 40.9 40.7  MCV 93.2 94.7  PLT 124* 976*   Basic Metabolic Panel Recent Labs    05/06/21 0238 05/07/21 0302 05/08/21 0245  NA 137 136 140  K 3.8 3.7 3.4*  CL 97* 97*  98  CO2 29 28 32  GLUCOSE 171* 165* 148*  BUN 25* 28* 29*  CREATININE 1.22 1.42* 1.43*  CALCIUM 8.8* 8.9 8.9  MG 2.1  --  2.3   Liver Function Tests No results for input(s): AST, ALT, ALKPHOS, BILITOT, PROT, ALBUMIN in the last 72 hours. No results for input(s): LIPASE, AMYLASE in the last 72 hours. Cardiac Enzymes No results for input(s): CKTOTAL, CKMB, CKMBINDEX, TROPONINI in the last 72 hours.   Telemetry    AF 80-90s mostly (personally reviewed)  Radiology    EP STUDY  Result Date: 05/07/2021 CONCLUSIONS:  1. Ischemic cardiomyopathy with Left bundle-branch block and chronic New York Heart Association class III heart failure.  2. Successful biventricular ICD implantation.  3. No early apparent complications. ICD Criteria Current LVEF:34%%. Within 12 months prior to implant: Yes Heart failure history: Yes, Class III Cardiomyopathy history: Yes, Ischemic  Cardiomyopathy - Prior MI. Atrial Fibrillation/Atrial Flutter: Yes, Permanent. Ventricular tachycardia history: No. Cardiac arrest history: No. History of syndromes with risk of sudden death: No. Previous ICD: No. Current ICD indication: Primary PPM indication: No.  Class I or II Bradycardia indication present: No Beta Blocker therapy for 3 or more months: Yes, prescribed. Ace Inhibitor/ARB therapy for 3 or more months: Yes, prescribed. Cristopher Peru, MD 4:26 PM 05/07/2021   EP PPM/ICD IMPLANT  Result Date: 05/07/2021 CONCLUSIONS:  1. Ischemic cardiomyopathy with Left bundle-branch block and chronic New York Heart Association class III heart failure.  2. Successful biventricular ICD implantation.  3. No early apparent complications. ICD Criteria Current LVEF:34%%. Within 12 months prior to implant: Yes Heart failure history: Yes, Class III Cardiomyopathy history: Yes, Ischemic Cardiomyopathy - Prior MI. Atrial Fibrillation/Atrial Flutter: Yes, Permanent. Ventricular tachycardia history: No. Cardiac arrest history: No. History of syndromes with risk of sudden death: No. Previous ICD: No. Current ICD indication: Primary PPM indication: No.  Class I or II Bradycardia indication present: No Beta Blocker therapy for 3 or more months: Yes, prescribed. Ace Inhibitor/ARB therapy for 3 or more months: Yes, prescribed. Cristopher Peru, MD 4:26 PM 05/07/2021   MR CARDIAC MORPHOLOGY W WO CONTRAST  Result Date: 05/07/2021 CLINICAL DATA:  Cardiomyopathy evaluation. EXAM: CARDIAC MRI TECHNIQUE: The patient was scanned on a 1.5 Tesla Siemens magnet. A dedicated cardiac coil was used. Functional imaging was done using Fiesta sequences. 2,3, and 4 chamber views were done to assess for RWMA's. Modified Simpson's rule using a short axis stack was used to calculate an ejection fraction on a dedicated work Conservation officer, nature. The patient received 10 cc of Gadavist. After 10 minutes inversion recovery sequences were used to  assess for infiltration and scar tissue. CONTRAST:  10 cc  of Gadavist FINDINGS: Left ventricle: -Normal size -Moderate systolic dysfunction. Septal dyskinesis consistent with LBBB. -Nonspecific elevation in ECV (35%) -Elevated T2 values (up to 34ms in basal inferior wall) -Subendocardial LGE at apex, basal to mid inferolateral and mid anterolateral walls -Basal septal and anterior midwall LGE LV EF: 34% (Normal 56-78%) Absolute volumes: LV EDV: 221mL (Normal 77-195 mL) LV ESV: 138mL (Normal 19-72 mL) LV SV: 42mL (Normal 51-133 mL) CO: 5.7L/min (Normal 2.8-8.8 L/min) Indexed volumes: LV EDV: 19mL/sq-m (Normal 47-92 mL/sq-m) LV ESV: 24mL/sq-m (Normal 13-30 mL/sq-m) LV SV: 42mL/sq-m (Normal 32-62 mL/sq-m) CI: 2.2L/min/sq-m (Normal 1.7-4.2 L/min/sq-m) Right ventricle: Normal size and systolic function RV EF:  53% (Normal 47-74%) Absolute volumes: RV EDV: 163mL (Normal 88-227 mL) RV ESV: 62mL (Normal 23-103 mL) RV SV: 62mL (Normal 52-138 mL) CO:  6.2L/min (Normal 2.8-8.8 L/min) Indexed volumes: RV EDV: 73mL/sq-m (Normal 55-105 mL/sq-m) RV ESV: 11mL/sq-m (Normal 15-43 mL/sq-m) RV SV: 22mL/sq-m (Normal 32-64 mL/sq-m) CI: 2.4L/min/sq-m (Normal 1.7-4.2 L/min/sq-m) Left atrium: Severe enlargement Right atrium: Mild enlargement Mitral valve: No regurgitation Aortic valve: No regurgitation Tricuspid valve: Trivial regurgitation Pulmonic valve: Trivial regurgitation Aorta: Dilated ascending aorta measuring 69mm Pericardium: Normal IMPRESSION: 1. Mixed cardiomyopathy with evidence of ischemic and nonischemic etiologies 2. Subendocardial late gadolinium enhancement consistent with prior infarcts at apex, basal to mid inferolateral, and mid anterolateral walls. LGE is greater than 50% transmural at the apex, suggesting nonviability. Remainder of myocardium appears viable. 3. Basal septal midwall LGE, which is a scar pattern seen in nonischemic cardiomyopathies and associated with a worse prognosis. Also with mid myocardial LGE in  basal anterior wall 4. Elevated myocardial T2 values (up to 45ms in basal inferior wall), suggesting myocardial edema 5. Normal LV size with moderate systolic dysfunction (EF 48%). Septal dyskinesis consistent with LBBB. 6.  Normal RV size and systolic function (EF 18%) 7.  Dilated ascending aorta measuring 34mm Electronically Signed   By: Oswaldo Milian M.D.   On: 05/07/2021 23:05    Patient Profile     Tommy Riley is a 75 y.o. male with a hx of atrial fib and recurrent CHF who is being seen 05/04/2021 for the evaluation of uncontrolled atrial fib at the request of Dr. Varney Daily.  Assessment & Plan    Acute on chronic systolic heart failure  Cath 05/05/2021 with patent grafts and elevated PA pressures.   Have asked CHF team to see and assist given his continued symptoms despite GDMT cMRI with EF 34%  S/p [employer] CRT-D with LBBB lead in RA port.  CXR pending.   2. CKD II Cr up slightly at 1.42.  Diuresis on-going.   3. Atrial fibrillation, permanent Maximize rate control as tolerated. Not rhythm control candidate.  Could consider AV nodal ablation in the future if rates become an issue.    4. Obesity Body mass index is 38.11 Lifestyle modification has been encouraged.    For questions or updates, please contact [place] Please consult [email or web address] for contact info under Cardiology/STEMI.  Signed, Shirley Friar, PA-C  05/08/2021, 8:31 AM   I have seen and examined this patient with Oda Kilts.  Agree with above, note added to reflect my findings.  On exam, RRR, no murmurs.  She is now status post BSCI CRT-D for CHF.  Device functioning appropriately.  Chest x-ray and interrogation without issue.  OK for discharge when clinically stable with follow-up in device clinic.  Ashonte Angelucci M. Andjela Wickes MD 05/08/2021 1:54 PM

## 2021-05-08 NOTE — Progress Notes (Signed)
SATURATION QUALIFICATIONS: (This note is used to comply with regulatory documentation for home oxygen)  Patient Saturations on Room Air at Rest >/= 76%  Patient Saturations on Room Air while Ambulating >/= 83%  Patient Saturations on 2 Liters of oxygen while Ambulating >/= 91%  Please briefly explain why patient needs home oxygen: Pt's sats decrease to as low as 76% without supplemental O2.   Moishe Spice, PT, DPT Acute Rehabilitation Services  Pager: [phone] Office: [phone]

## 2021-05-08 NOTE — Care Management Important Message (Signed)
Important Message  Patient Details  Name: Tommy Riley MRN: [id number] Date of Birth: 09-20-1945   Medicare Important Message Given:  Yes     Shelda Altes 05/08/2021, 11:00 AM

## 2021-05-08 NOTE — Progress Notes (Signed)
Physical Therapy Treatment Patient Details Name: Tommy Riley MRN: [id number] DOB: [birth year] Today's Date: 05/08/2021   History of Present Illness 75 y.o. male via direct admit from cardiologist office 04/30/21 for treatment of persistent A fib. S/p R/L heart cath 05/05/21. S/p CRT-D implant 11/2. PMH:  type 2 diabetes mellitus, hyperlipidemia, CHF, CAD s/p CABG, COPD, OSA on CPAP, gout    PT Comments    Pt now s/p CRT-D implant 11/2. Reviewed and provided handout on pacemaker precautions and use/donning of sling. Pt continues to be limited in mobility by deficits in aerobic endurance, requiring 2L of O2 via [place] to maintain SpO2 levels >/= 91%. When on RA, SpO2 levels ranged from >/= 76% at rest to >/= 83% when ambulating. Will continue to follow acutely. Current recommendations remain appropriate.    Recommendations for follow up therapy are one component of a multi-disciplinary discharge planning process, led by the attending physician.  Recommendations may be updated based on patient status, additional functional criteria and insurance authorization.  Follow Up Recommendations  Home health PT     Assistance Recommended at Discharge Intermittent Supervision/Assistance  Equipment Recommendations  None recommended by PT    Recommendations for Other Services       Precautions / Restrictions Precautions Precautions: Fall;ICD/Pacemaker Precaution Comments: Provided handout and reviewed pacemaker precautions and use of sling Required Braces or Orthoses: Sling Restrictions Weight Bearing Restrictions: Yes LUE Weight Bearing:  (pacemaker precautions) Other Position/Activity Restrictions: pacemaker precautions     Mobility  Bed Mobility Overal bed mobility: Modified Independent             General bed mobility comments: Pt able to perform all bed mobility without assistance, HOB elevated.    Transfers Overall transfer level: Needs assistance Equipment used: Straight  cane Transfers: Sit to/from Stand Sit to Stand: Supervision           General transfer comment: supervision for safety    Ambulation/Gait Ambulation/Gait assistance: Supervision Gait Distance (Feet): 240 Feet Assistive device: Straight cane Gait Pattern/deviations: Step-through pattern;Decreased step length - right;Decreased step length - left;Trunk flexed;Shuffle;Wide base of support Gait velocity: slowed Gait velocity interpretation: <1.8 ft/sec, indicate of risk for recurrent falls General Gait Details: supervision for slow, shuffling gait, mild instability with wide BOS (likely due to reported neuropathy), no overt LOB   Stairs             Wheelchair Mobility    Modified Rankin (Stroke Patients Only)       Balance Overall balance assessment: Needs assistance Sitting-balance support: Feet supported;No upper extremity supported Sitting balance-Leahy Scale: Normal     Standing balance support: Single extremity supported;During functional activity;No upper extremity supported Standing balance-Leahy Scale: Fair Standing balance comment: able to static stand to put mask on without UE support                            Cognition Arousal/Alertness: Awake/alert Behavior During Therapy: WFL for tasks assessed/performed Overall Cognitive Status: Within Functional Limits for tasks assessed                                          Exercises      General Comments General comments (skin integrity, edema, etc.): SpO2 >/= 76% on RA at rest, >/= 83% on RA during gait, >/= 91% on 2L via [place] during gait  Pertinent Vitals/Pain Pain Assessment: Faces Faces Pain Scale: Hurts little more Pain Location: neck bil Pain Descriptors / Indicators: Discomfort;Grimacing;Guarding Pain Intervention(s): Limited activity within patient's tolerance;Monitored during session;Repositioned;Utilized relaxation techniques (soft tissue mobilization provided to  bil upper traps and levator scaps and instructed pt on gentle stretching, monitoring location of CRT-D implant)    Home Living                          Prior Function            PT Goals (current goals can now be found in the care plan section) Acute Rehab PT Goals Patient Stated Goal: to be able to walk his dog again PT Goal Formulation: With patient Time For Goal Achievement: 05/19/21 Potential to Achieve Goals: Fair Progress towards PT goals: Progressing toward goals    Frequency    Min 3X/week      PT Plan Current plan remains appropriate    Co-evaluation              AM-PAC PT "6 Clicks" Mobility   Outcome Measure  Help needed turning from your back to your side while in a flat bed without using bedrails?: None Help needed moving from lying on your back to sitting on the side of a flat bed without using bedrails?: None Help needed moving to and from a bed to a chair (including a wheelchair)?: A Little Help needed standing up from a chair using your arms (e.g., wheelchair or bedside chair)?: A Little Help needed to walk in hospital room?: A Little Help needed climbing 3-5 steps with a railing? : A Little 6 Click Score: 20    End of Session Equipment Utilized During Treatment: Oxygen Activity Tolerance: Patient tolerated treatment well Patient left: in bed;with call bell/phone within reach;with nursing/sitter in room Nurse Communication: Mobility status PT Visit Diagnosis: Other abnormalities of gait and mobility (R26.89);Muscle weakness (generalized) (M62.81);Difficulty in walking, not elsewhere classified (R26.2);Unsteadiness on feet (R26.81)     Time: 5537-4827 PT Time Calculation (min) (ACUTE ONLY): 36 min  Charges:  $Gait Training: 8-22 mins $Therapeutic Activity: 8-22 mins                     Moishe Spice, PT, DPT Acute Rehabilitation Services  Pager: [phone] Office: [place] 05/08/2021, 5:50 PM

## 2021-05-08 NOTE — Plan of Care (Signed)
°  Problem: Clinical Measurements: °Goal: Ability to maintain clinical measurements within normal limits will improve °Outcome: Progressing °Goal: Cardiovascular complication will be avoided °Outcome: Progressing °  °Problem: Activity: °Goal: Risk for activity intolerance will decrease °Outcome: Progressing °  °Problem: Nutrition: °Goal: Adequate nutrition will be maintained °Outcome: Progressing °  °

## 2021-05-08 NOTE — Progress Notes (Addendum)
Advanced Heart Failure Rounding Note  PCP-Cardiologist: Rozann Lesches, MD   Subjective:    cMRI EF 34%, findings c/w mixed ischemic/NICM (see below)  Underwent CRT-D placement yesterday. Did not get AV nodal ablation.   Afib rates  80s-low 100s.   CXR today w/ no PTX. Device pocket stable.   Good diuresis yesterday. 4L in UOP. Wt down 2 lb. Remains fluid overloaded. Denies dyspnea.     cMRI 05/07/21  IMPRESSION: 1. Mixed cardiomyopathy with evidence of ischemic and nonischemic etiologies   2. Subendocardial late gadolinium enhancement consistent with prior infarcts at apex, basal to mid inferolateral, and mid anterolateral walls. LGE is greater than 50% transmural at the apex, suggesting nonviability. Remainder of myocardium appears viable.   3. Basal septal midwall LGE, which is a scar pattern seen in nonischemic cardiomyopathies and associated with a worse prognosis. Also with mid myocardial LGE in basal anterior wall   4. Elevated myocardial T2 values (up to 67ms in basal inferior wall), suggesting myocardial edema   5. Normal LV size with moderate systolic dysfunction (EF 30%). Septal dyskinesis consistent with LBBB.   6.  Normal RV size and systolic function (EF 86%)   7.  Dilated ascending aorta measuring 51mm    Objective:   Weight Range: 127.5 kg Body mass index is 38.11 kg/m.   Vital Signs:   Temp:  [97.7 F (36.5 C)-98.6 F (37 C)] 98.6 F (37 C) (11/03 0521) Pulse Rate:  [0-159] 80 (11/03 0521) Resp:  [0-40] 18 (11/03 0521) BP: (99-134)/(52-80) 118/68 (11/03 0521) SpO2:  [0 %-100 %] 94 % (11/03 0521) Weight:  [127.5 kg] 127.5 kg (11/03 0521) Last BM Date: 05/06/21  Weight change: Filed Weights   05/06/21 0622 05/07/21 0547 05/08/21 0521  Weight: 130.5 kg 128.5 kg 127.5 kg    Intake/Output:   Intake/Output Summary (Last 24 hours) at 05/08/2021 0932 Last data filed at 05/08/2021 0645 Gross per 24 hour  Intake 1060.89 ml  Output  4445 ml  Net -3384.11 ml      Physical Exam     General:  obese male, sitting up on side of bed. Pleasant. No respiratory difficulty HEENT: normal Neck: supple. Thick neck, JVD not well visualized. Carotids 2+ bilat; no bruits. No lymphadenopathy or thyromegaly appreciated. Cor: PMI nondisplaced. Irregularly irregular rhythm. No rubs, gallops or murmurs. Device pocket left upper chest bandage, gauze dry  Lungs: decreased BS at the bases bilaterally  Abdomen: obese, soft, nontender, nondistended. No hepatosplenomegaly. No bruits or masses. Good bowel sounds. Extremities: no cyanosis, clubbing, rash, upper and lower extremities edematous, 2+ bilaterally  Neuro: alert & oriented x 3, cranial nerves grossly intact. moves all 4 extremities w/o difficulty. Affect pleasant.    Telemetry   Afib 80s-low 100s, personally reviewed   EKG    Afib w/ PVCs LBBB, 88 bpm. Personally reviewed   Labs    CBC Recent Labs    05/07/21 0302 05/08/21 0245  WBC 4.5 5.2  HGB 13.1 12.9*  HCT 40.9 40.7  MCV 93.2 94.7  PLT 124* 578*   Basic Metabolic Panel Recent Labs    05/06/21 0238 05/07/21 0302 05/08/21 0245  NA 137 136 140  K 3.8 3.7 3.4*  CL 97* 97* 98  CO2 29 28 32  GLUCOSE 171* 165* 148*  BUN 25* 28* 29*  CREATININE 1.22 1.42* 1.43*  CALCIUM 8.8* 8.9 8.9  MG 2.1  --  2.3   Liver Function Tests No results for input(s): AST, ALT,  ALKPHOS, BILITOT, PROT, ALBUMIN in the last 72 hours. No results for input(s): LIPASE, AMYLASE in the last 72 hours. Cardiac Enzymes No results for input(s): CKTOTAL, CKMB, CKMBINDEX, TROPONINI in the last 72 hours.  BNP: BNP (last 3 results) Recent Labs    04/13/21 1724 04/17/21 0646 05/01/21 0304  BNP 305.0* 382.0* 498.7*    ProBNP (last 3 results) No results for input(s): PROBNP in the last 8760 hours.   D-Dimer No results for input(s): DDIMER in the last 72 hours. Hemoglobin A1C No results for input(s): HGBA1C in the last 72  hours. Fasting Lipid Panel No results for input(s): CHOL, HDL, LDLCALC, TRIG, CHOLHDL, LDLDIRECT in the last 72 hours. Thyroid Function Tests No results for input(s): TSH, T4TOTAL, T3FREE, THYROIDAB in the last 72 hours.  Invalid input(s): FREET3  Other results:   Imaging    DG Chest 2 View  Result Date: 05/08/2021 CLINICAL DATA:  Defibrillator placement. EXAM: CHEST - 2 VIEW COMPARISON:  April 13, 2021. FINDINGS: Stable cardiomegaly. Interval placement of left-sided defibrillator with leads in grossly good position. No pneumothorax is noted. Lungs are clear. Bony thorax is unremarkable. IMPRESSION: Interval placement of left-sided defibrillator. No pneumothorax is noted. Electronically Signed   By: Marijo Conception M.D.   On: 05/08/2021 08:58   EP STUDY  Result Date: 05/07/2021 CONCLUSIONS:  1. Ischemic cardiomyopathy with Left bundle-branch block and chronic New York Heart Association class III heart failure.  2. Successful biventricular ICD implantation.  3. No early apparent complications. ICD Criteria Current LVEF:34%%. Within 12 months prior to implant: Yes Heart failure history: Yes, Class III Cardiomyopathy history: Yes, Ischemic Cardiomyopathy - Prior MI. Atrial Fibrillation/Atrial Flutter: Yes, Permanent. Ventricular tachycardia history: No. Cardiac arrest history: No. History of syndromes with risk of sudden death: No. Previous ICD: No. Current ICD indication: Primary PPM indication: No.  Class I or II Bradycardia indication present: No Beta Blocker therapy for 3 or more months: Yes, prescribed. Ace Inhibitor/ARB therapy for 3 or more months: Yes, prescribed. Cristopher Peru, MD 4:26 PM 05/07/2021   EP PPM/ICD IMPLANT  Result Date: 05/07/2021 CONCLUSIONS:  1. Ischemic cardiomyopathy with Left bundle-branch block and chronic New York Heart Association class III heart failure.  2. Successful biventricular ICD implantation.  3. No early apparent complications. ICD Criteria Current  LVEF:34%%. Within 12 months prior to implant: Yes Heart failure history: Yes, Class III Cardiomyopathy history: Yes, Ischemic Cardiomyopathy - Prior MI. Atrial Fibrillation/Atrial Flutter: Yes, Permanent. Ventricular tachycardia history: No. Cardiac arrest history: No. History of syndromes with risk of sudden death: No. Previous ICD: No. Current ICD indication: Primary PPM indication: No.  Class I or II Bradycardia indication present: No Beta Blocker therapy for 3 or more months: Yes, prescribed. Ace Inhibitor/ARB therapy for 3 or more months: Yes, prescribed. Cristopher Peru, MD 4:26 PM 05/07/2021     Medications:     Scheduled Medications:  acetaZOLAMIDE  250 mg Oral BID   albuterol  2.5 mg Nebulization BID   aspirin  81 mg Oral Daily   dapagliflozin propanediol  10 mg Oral Daily   digoxin  0.125 mg Oral Daily   ezetimibe  10 mg Oral Daily   feeding supplement  237 mL Oral BID BM   fluticasone  1 spray Each Nare QHS   fluticasone furoate-vilanterol  1 puff Inhalation Daily   And   umeclidinium bromide  1 puff Inhalation Daily   folic acid  262 mcg Oral Daily   furosemide  80 mg Intravenous TID  with meals   guaiFENesin  1,200 mg Oral BID   insulin aspart  0-20 Units Subcutaneous TID WC   insulin aspart  10 Units Subcutaneous QPM   insulin detemir  15 Units Subcutaneous QHS   loratadine  10 mg Oral Daily   metolazone  2.5 mg Oral Daily   metoprolol  200 mg Oral Daily   omega-3 acid ethyl esters  2 g Oral BID   potassium chloride  40 mEq Oral BID   potassium chloride  40 mEq Oral Once   rosuvastatin  40 mg Oral QHS   sacubitril-valsartan  1 tablet Oral BID   sertraline  150 mg Oral Daily   sodium chloride flush  3 mL Intravenous Q12H   spironolactone  25 mg Oral Daily    Infusions:  sodium chloride      PRN Medications: sodium chloride, acetaminophen, albuterol, ondansetron (ZOFRAN) IV, ondansetron (ZOFRAN) IV, sodium chloride flush, zolpidem    Patient Profile   Tommy Riley  is a 75 y.o. male with history of chronic systolic and diastolic HF, permanent AF, CAD s/p CABG, CKD, DM. Admitted with second heart failure exacerbation in the past month.  Assessment/Plan   1. Acute on chronic systolic HF: - EF 73-53% on echo, RV mildly reduced - Etiology of LV dysfunction unclear but cMRI suggest mixed ischemic/nonischemic CM - Remains volume overloaded, diuresing slowly  - Continue Lasix 80 IV tid 2.5 metolazone and diamox 250 bid - Long discussion about need to limit high fluid intake at home - I think this is the majority of his problem  - May benefit from Cardiomems - Continue Entresto 24-26 mg bid today. If renal fx remains stable will gradually titrate  - Continue Spiro 25 mg daily  - Continue Toprol 200 daily - Consider Farxiga 10 mg daily    2. CAD s/p CABG - grafts patent on cath 05/05/21   3. 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 Needs weight loss   4. Chronic AF with LBBB and intermittent rate control  - Has been in AF > 10 years despite previous AF/AFL ablations - Reportedly has had problems with rate control - Now controlled on digoxin and Toprol  - s/p CRT-D implant 11/2 - Could consider AV nodal ablation in the future if difficult to rate control  - Restart Eliquis once ok w/ EP    5. OSA - on CPAP   6. Morbid obesity - needs weight loss.   7. Hypokalemia - K 3.4. Mg ok at 2.3  - Supp K  - c/w spiro   Length of Stay: [address], PA-C  05/08/2021, 9:32 AM  Advanced Heart Failure Team Pager [phone] (M-F; 7a - 5p)  Please contact [facility] for night-coverage after hours (5p -7a ) and weekends on [email or web address]  Patient seen and examined with the above-signed Advanced Practice Provider and/or Housestaff. I personally reviewed laboratory data, imaging studies and relevant  notes. I independently examined the patient and formulated the important aspects of the plan. I have edited the note to reflect any of my changes or salient points. I have personally discussed the plan with the patient and/or family.   Underwent CRT-D yesterday. Continues to diurese on IV lasix, metolazone and diamox. Weight down 7 pounds total. Breathing a bit better  but still dyspneic. No CP. Remains in AF 90-100.   General:  Obese male sitting up  No resp difficulty HEENT: normal Neck: supple. JVP up. Carotids 2+ bilat; no bruits. No lymphadenopathy or thryomegaly appreciated. Cor: PMI nondisplaced. Irregular rate & rhythm. No rubs, gallops or murmurs. Pocket site dressing ok  Lungs: decreased at bases Abdomen: obese soft, nontender, nondistended. No hepatosplenomegaly. No bruits or masses. Good bowel sounds. Extremities: no cyanosis, clubbing, rash, 1-2+ edema wrapped  Neuro: alert & orientedx3, cranial nerves grossly intact. moves all 4 extremities w/o difficulty. Affect pleasant  Volume status improving slowly. Will continue diuresis. Watch renal function carefully. Hopefully CRT will help. AF currently rate controlled. Supp K+.  Glori Bickers, MD  11:25 AM

## 2021-05-09 LAB — GLUCOSE, CAPILLARY
Glucose-Capillary: 185 mg/dL — ABNORMAL HIGH (ref 70–99)
Glucose-Capillary: 274 mg/dL — ABNORMAL HIGH (ref 70–99)
Glucose-Capillary: 167 mg/dL — ABNORMAL HIGH (ref 70–99)

## 2021-05-09 LAB — BASIC METABOLIC PANEL
Anion gap: 8 (ref 5–15)
Anion gap: 11 (ref 5–15)
Anion gap: 10 (ref 5–15)
BUN: 36 mg/dL — ABNORMAL HIGH (ref 8–23)
BUN: 35 mg/dL — ABNORMAL HIGH (ref 8–23)
BUN: 42 mg/dL — ABNORMAL HIGH (ref 8–23)
CO2: 35 mmol/L — ABNORMAL HIGH (ref 22–32)
CO2: 34 mmol/L — ABNORMAL HIGH (ref 22–32)
CO2: 32 mmol/L (ref 22–32)
Calcium: 8.8 mg/dL — ABNORMAL LOW (ref 8.9–10.3)
Calcium: 8.8 mg/dL — ABNORMAL LOW (ref 8.9–10.3)
Calcium: 8.6 mg/dL — ABNORMAL LOW (ref 8.9–10.3)
Chloride: 97 mmol/L — ABNORMAL LOW (ref 98–111)
Chloride: 93 mmol/L — ABNORMAL LOW (ref 98–111)
Chloride: 93 mmol/L — ABNORMAL LOW (ref 98–111)
Creatinine, Ser: 1.54 mg/dL — ABNORMAL HIGH (ref 0.61–1.24)
Creatinine, Ser: 1.53 mg/dL — ABNORMAL HIGH (ref 0.61–1.24)
Creatinine, Ser: 1.58 mg/dL — ABNORMAL HIGH (ref 0.61–1.24)
GFR, Estimated: 48 mL/min — ABNORMAL LOW (ref >60)
GFR, Estimated: 48 mL/min — ABNORMAL LOW (ref >60)
GFR, Estimated: 46 mL/min — ABNORMAL LOW (ref >60)
Glucose, Bld: 192 mg/dL — ABNORMAL HIGH (ref 70–99)
Glucose, Bld: 264 mg/dL — ABNORMAL HIGH (ref 70–99)
Glucose, Bld: 209 mg/dL — ABNORMAL HIGH (ref 70–99)
Potassium: 2.8 mmol/L — ABNORMAL LOW (ref 3.5–5.1)
Potassium: 2.7 mmol/L — CL (ref 3.5–5.1)
Potassium: 4.7 mmol/L (ref 3.5–5.1)
Sodium: 140 mmol/L (ref 135–145)
Sodium: 138 mmol/L (ref 135–145)
Sodium: 135 mmol/L (ref 135–145)

## 2021-05-09 LAB — CBC
HCT: 40.3 % (ref 39.0–52.0)
Hemoglobin: 12.8 g/dL — ABNORMAL LOW (ref 13.0–17.0)
MCH: 29.8 pg (ref 26.0–34.0)
MCHC: 31.8 g/dL (ref 30.0–36.0)
MCV: 93.7 fL (ref 80.0–100.0)
Platelets: 146 10*3/uL — ABNORMAL LOW (ref 150–400)
RBC: 4.3 MIL/uL (ref 4.22–5.81)
RDW: 16.1 % — ABNORMAL HIGH (ref 11.5–15.5)
WBC: 4.8 10*3/uL (ref 4.0–10.5)
nRBC: 0 % (ref 0.0–0.2)

## 2021-05-09 LAB — MAGNESIUM: Magnesium: 2.6 mg/dL — ABNORMAL HIGH (ref 1.7–2.4)

## 2021-05-09 MED ORDER — POTASSIUM CHLORIDE CRYS ER 20 MEQ PO TBCR: 40.0000 meq | EXTENDED_RELEASE_TABLET | ORAL | Status: DC

## 2021-05-09 MED ORDER — POTASSIUM CHLORIDE CRYS ER 20 MEQ PO TBCR: 20.0000 meq | EXTENDED_RELEASE_TABLET | Freq: Two times a day (BID) | ORAL | 3 refills | Status: DC

## 2021-05-09 MED ORDER — POTASSIUM CHLORIDE CRYS ER 20 MEQ PO TBCR
40.0000 meq | EXTENDED_RELEASE_TABLET | ORAL | Status: AC
  Administered 2021-05-09 (×2): 40 meq via ORAL
  Filled 2021-05-09: qty 2

## 2021-05-09 MED ORDER — POTASSIUM CHLORIDE CRYS ER 20 MEQ PO TBCR: 60.0000 meq | EXTENDED_RELEASE_TABLET | Freq: Two times a day (BID) | ORAL | Status: DC

## 2021-05-09 MED ORDER — TORSEMIDE 60 MG PO TABS: 60.0000 mg | ORAL_TABLET | Freq: Two times a day (BID) | ORAL | 5 refills | Status: DC

## 2021-05-09 MED ORDER — POTASSIUM CHLORIDE 10 MEQ/100ML IV SOLN
10.0000 meq | INTRAVENOUS | Status: AC
  Administered 2021-05-09 (×3): 10 meq via INTRAVENOUS
  Filled 2021-05-09 (×3): qty 100

## 2021-05-09 MED ORDER — TORSEMIDE 20 MG PO TABS: 60.0000 mg | ORAL_TABLET | Freq: Every day | ORAL | Status: DC

## 2021-05-09 MED ORDER — OMEGA-3-ACID ETHYL ESTERS 1 G PO CAPS: 2.0000 g | ORAL_CAPSULE | Freq: Two times a day (BID) | ORAL | Status: DC

## 2021-05-09 MED ORDER — POTASSIUM CHLORIDE CRYS ER 20 MEQ PO TBCR
40.0000 meq | EXTENDED_RELEASE_TABLET | ORAL | Status: AC
  Administered 2021-05-09 (×2): 40 meq via ORAL
  Filled 2021-05-09 (×2): qty 2

## 2021-05-09 MED ORDER — SPIRONOLACTONE 25 MG PO TABS: 25.0000 mg | ORAL_TABLET | Freq: Every day | ORAL | 5 refills | Status: DC

## 2021-05-09 MED ORDER — APIXABAN 5 MG PO TABS: 5.0000 mg | ORAL_TABLET | Freq: Two times a day (BID) | ORAL | 5 refills | Status: DC

## 2021-05-09 MED ORDER — POTASSIUM CHLORIDE CRYS ER 20 MEQ PO TBCR: 40.0000 meq | EXTENDED_RELEASE_TABLET | Freq: Two times a day (BID) | ORAL | 3 refills | Status: DC

## 2021-05-09 NOTE — Progress Notes (Signed)
Physical Therapy Treatment Patient Details Name: Tommy Riley MRN: [id number] DOB: [birth year] Today's Date: 05/09/2021   History of Present Illness 75 y.o. male via direct admit from cardiologist office 04/30/21 for treatment of persistent A fib. S/p R/L heart cath 05/05/21. S/p CRT-D implant 11/2. PMH:  type 2 diabetes mellitus, hyperlipidemia, CHF, CAD s/p CABG, COPD, OSA on CPAP, gout    PT Comments    Focused session on educating pt and wife on safe progression of frequent mobility, sleep habits, improving healthy diet, weighing self consistently, pacemaker precautions, donning of sling, and limiting sodium, processed foods, and fluid intake. They verbalized understanding. Performed bedroom mobility with pt deferring further mobility due to just finishing working with mobility specialist. Will continue to follow acutely. Current recommendations remain appropriate.    Recommendations for follow up therapy are one component of a multi-disciplinary discharge planning process, led by the attending physician.  Recommendations may be updated based on patient status, additional functional criteria and insurance authorization.  Follow Up Recommendations  Home health PT     Assistance Recommended at Discharge Intermittent Supervision/Assistance  Equipment Recommendations  None recommended by PT    Recommendations for Other Services       Precautions / Restrictions Precautions Precautions: Fall;ICD/Pacemaker Precaution Comments: Provided handout and reviewed pacemaker precautions and use of sling Required Braces or Orthoses: Sling Restrictions Weight Bearing Restrictions: No LUE Weight Bearing:  (pacemaker precautions) Other Position/Activity Restrictions: pacemaker precautions     Mobility  Bed Mobility               General bed mobility comments: Pt sitting EOB end of session and in bathroom upon arrival.    Transfers Overall transfer level: Needs  assistance Equipment used: Straight cane Transfers: Sit to/from Stand Sit to Stand: Supervision           General transfer comment: supervision for safety    Ambulation/Gait Ambulation/Gait assistance: Supervision Gait Distance (Feet): 20 Feet (x2 bouts of ~20 ft each) Assistive device: Straight cane Gait Pattern/deviations: Step-through pattern;Decreased step length - right;Decreased step length - left;Trunk flexed;Shuffle;Wide base of support Gait velocity: slowed Gait velocity interpretation: <1.8 ft/sec, indicate of risk for recurrent falls General Gait Details: supervision for slow, shuffling gait, mild instability with wide BOS (likely due to reported neuropathy), no overt LOB. Pt deferring increased gait distance due to just working with mobility specialist and leaving to go home soon.   Stairs             Wheelchair Mobility    Modified Rankin (Stroke Patients Only)       Balance Overall balance assessment: Needs assistance Sitting-balance support: Feet supported;No upper extremity supported Sitting balance-Leahy Scale: Normal     Standing balance support: Single extremity supported;During functional activity;No upper extremity supported Standing balance-Leahy Scale: Fair Standing balance comment: able to static stand and take a few steps without UE support but otherwise uses 1 UE support with cane for mobility.                            Cognition Arousal/Alertness: Awake/alert Behavior During Therapy: WFL for tasks assessed/performed Overall Cognitive Status: Within Functional Limits for tasks assessed                                          Exercises      General  Comments General comments (skin integrity, edema, etc.): Educated pt on weighing self daily, increasing frequency of activity with progression, sleeping habits, improved healthy diet, and limiting sodium, processed foods, and fluid intake; educated and  demonstrated pt and wife on donning of sling      Pertinent Vitals/Pain Pain Assessment: Faces Faces Pain Scale: No hurt Pain Intervention(s): Monitored during session    Home Living                          Prior Function            PT Goals (current goals can now be found in the care plan section) Acute Rehab PT Goals Patient Stated Goal: to go home today PT Goal Formulation: With patient/family Time For Goal Achievement: 05/19/21 Potential to Achieve Goals: Fair Progress towards PT goals: Progressing toward goals    Frequency    Min 3X/week      PT Plan Current plan remains appropriate    Co-evaluation              AM-PAC PT "6 Clicks" Mobility   Outcome Measure  Help needed turning from your back to your side while in a flat bed without using bedrails?: None Help needed moving from lying on your back to sitting on the side of a flat bed without using bedrails?: None Help needed moving to and from a bed to a chair (including a wheelchair)?: A Little Help needed standing up from a chair using your arms (e.g., wheelchair or bedside chair)?: A Little Help needed to walk in hospital room?: A Little Help needed climbing 3-5 steps with a railing? : A Little 6 Click Score: 20    End of Session Equipment Utilized During Treatment: Oxygen Activity Tolerance: Patient tolerated treatment well Patient left: in bed;with call bell/phone within reach;with family/visitor present Nurse Communication: Mobility status PT Visit Diagnosis: Other abnormalities of gait and mobility (R26.89);Muscle weakness (generalized) (M62.81);Difficulty in walking, not elsewhere classified (R26.2);Unsteadiness on feet (R26.81)     Time: 6659-9357 PT Time Calculation (min) (ACUTE ONLY): 22 min  Charges:  $Therapeutic Activity: 8-22 mins                     Moishe Spice, PT, DPT Acute Rehabilitation Services  Pager: [phone] Office: [place] 05/09/2021, 4:00 PM

## 2021-05-09 NOTE — Progress Notes (Signed)
TRH night cross cover note:  Notified by RN of this morning's potassium level 2.8.  Per chart review, it appears that he has an existing order for potassium chloride 40 mill equivalents p.o. twice daily.  will make no additional modifications to this existing order for potassium supplementation at this time.          Babs Bertin, DO Hospitalist

## 2021-05-09 NOTE — TOC CM/SW Note (Addendum)
HF TOC CM faxed updated oxygen order with CPAP bleed in. Jonnie Finner RN3 CCM, Heart Failure TOC CM [phone]   4:00 pm S/W DEBRA  @ [employer] : CONTACT FOR D/C PLANNING NEED. HAVE NCM TO RETURN CALL. PHONE # [phone]

## 2021-05-09 NOTE — TOC Initial Note (Signed)
Transition of Care [facility]) - Initial/Assessment Note    Patient Details  Name: Tommy Riley MRN: [id number] Date of Birth: 10/23/1945  Transition of Care [facility]) CM/SW Contact:    Erenest Rasher, RN Phone Number: [phone] 05/09/2021, 12:04 PM  Clinical Narrative:                 HF TOC CM spoke to pt and offered choice for [facility]. Pt states he is active with [facility]. Contacted [facility] rep, Kenzie for resumption of care. Contacted [facility] rep, Caryl Pina with new referral for oxygen. Will deliver a portable to room and Elane Fritz will deliver his oxygen concentrator to home. Pt has RW, cane, neb machine and CPAP at home.   Expected Discharge Plan: [place] Barriers to Discharge: No Barriers Identified   Patient Goals and CMS Choice Patient states their goals for this hospitalization and ongoing recovery are:: wants to remain independent at home CMS Medicare.gov Compare Post Acute Care list provided to:: Patient Choice offered to / list presented to : Patient  Expected Discharge Plan and Services Expected Discharge Plan: [place] In-house Referral: Clinical Social Work Discharge Planning Services: CM Consult Post Acute Care Choice: [place] arrangements for the past 2 months: [place]                   DME Agency: [facility] Date DME Agency Contacted: 05/09/21 Time DME Agency Contacted: 1203 Representative spoke with at DME Agency: Caryl Pina HH Arranged: RN, PT [facility] Agency: [place] ([place]) Date [place]: 05/09/21 Time [place]: 1203 Representative spoke with at [place]: Wylene Men  Prior Living Arrangements/Services Living arrangements for the past 2 months: [place] with:: Spouse Patient language and need for interpreter reviewed:: Yes Do you feel safe going back to the place where you live?: Yes      Need for Family Participation in Patient  Care: No (Comment) Care giver support system in place?: No (comment) Current home services: DME, Home PT, Home RN (CPAP, cane, nebulizer machine) Criminal Activity/Legal Involvement Pertinent to Current Situation/Hospitalization: No - Comment as needed  Activities of Daily Living Home Assistive Devices/Equipment: CPAP, Cane (specify quad or straight), CBG Meter, Nebulizer, Shower chair without back, Grab bars in shower, Hearing aid ADL Screening (condition at time of admission) Patient's cognitive ability adequate to safely complete daily activities?: Yes Is the patient deaf or have difficulty hearing?: Yes Does the patient have difficulty seeing, even when wearing glasses/contacts?: No Does the patient have difficulty concentrating, remembering, or making decisions?: No Patient able to express need for assistance with ADLs?: Yes Does the patient have difficulty dressing or bathing?: No Independently performs ADLs?: Yes (appropriate for developmental age) Does the patient have difficulty walking or climbing stairs?: Yes Weakness of Legs: Both Weakness of Arms/Hands: None  Permission Sought/Granted Permission sought to share information with : Case Manager, Family Supports, PCP Permission granted to share information with : Yes, Verbal Permission Granted  Share Information with NAME: Roxanne Orner  Permission granted to share info w AGENCY: [facility], DME agency  Permission granted to share info w Relationship: wife  Permission granted to share info w Contact Information: [phone]  Emotional Assessment Appearance:: Appears stated age Attitude/Demeanor/Rapport: Gracious, Engaged Affect (typically observed): Accepting Orientation: : Oriented to Self, Oriented to Place, Oriented to  Time, Oriented to Situation   Psych Involvement: No (comment)  Admission diagnosis:  Acute on chronic systolic heart failure (HCC) [I50.23] Patient Active Problem List   Diagnosis Date Noted    Elevated brain natriuretic peptide (BNP) level 04/13/2021   Elevated troponin I level 04/13/2021   Hyperglycemia due to diabetes mellitus ([place]) 04/13/2021   Acute respiratory failure with hypoxia ([place]) 04/13/2021   Shortness of breath 04/13/2021   Fall at home, initial encounter 04/13/2021   Ecchymosis 04/13/2021   Pneumonia due to COVID-19 virus 04/04/2020   Chronic respiratory failure with hypoxia ([place]) 08/29/2018   Other secondary pulmonary hypertension ([place]) 07/29/2018   Cough 07/29/2018   Non-nicotine vapor product user 07/29/2018   COPD (chronic obstructive pulmonary disease) ([place]) 12/14/2014   ASHD (arteriosclerotic heart disease) 12/14/2014   S/P CABG (coronary artery bypass graft) 12/14/2014   Permanent atrial fibrillation ([place]) 12/14/2014   HBP (high blood pressure) 12/14/2014   Acute on chronic systolic heart failure ([place]) 12/14/2014   Obesity 12/14/2014   OSA on CPAP 12/14/2014   Diabetes mellitus type 2 with complications ([place]) 46/80/3212   PCP:  Clinton Quant, MD Pharmacy:   [facility] [place] - [address] [place] [address] Phone: [phone] Fax: [phone]     Social Determinants of Health (SDOH) Interventions Food Insecurity Interventions: Intervention Not Indicated Financial Strain Interventions: Intervention Not Indicated Housing Interventions: Intervention Not Indicated Transportation Interventions: Intervention Not Indicated  Readmission Risk Interventions Readmission Risk Prevention Plan 04/16/2021  Transportation Screening Complete  Home Care Screening Complete  Medication Review (RN CM) Complete  Some recent data might be hidden

## 2021-05-09 NOTE — Discharge Summary (Addendum)
Advanced Heart Failure Team  Discharge Summary   Patient ID: Tommy Riley MRN: [id number], DOB/AGE: 1945/09/13 75 y.o. Admit date: 04/30/2021 D/C date:     05/09/2021   Primary Discharge Diagnoses:  Acute on chronic systolic HF CAD Pulmonary HTN Chronic AF LBBB OSA Morbid obesity Hypokalemia  Hospital Course:   Tommy Riley is seen today for evaluation of a/c systolic HF at the request of Dr. Lovena Le with EP. This is a 75 y.o. male with history of chronic systolic HF, ischemic cardiomyopathy, CAD s/p CABG in 2001, permanent atrial fibrillation s/p atrial fibrillation and atrial flutter ablation in 2001, OSA on CPAP, DM II.    Previously seen by Dr. Gilles Chiquito at [facility] for pulmonary hypertension in 2018. Felt to be multifactorial and likely combination of WHO groups 2 and 3.    Admitted to [facility] 10/09-10/15 with a/c HF. Echo with LVEF 35-40%, RV mildly reduced, RVSP 66 mmHg, trivial MR.   Directly admitted from Cardiology clinic on 10/26 with a/c CHF. Admitted by Cardiology. EP and Advanced Heart Failure consulted.   L/RHC 10/31 with patent grafts (Y graft from LIMA to distal LAD and distal circumflex marginal branch, vein graft to PDA). RHC- 1: Right atrial pressure-15/17 2: Right ventricular pressure-68/7 3: Pulmonary artery [id number] systolic, mean 40 4: CWCBJ-62 5: Cardiac output-4.9 L/min with an index of 2 L/min/m by Fick  Pulmonary HTN felt be d/t combination of WHO Group II and III.   cMRI 11/02 with evidence of mixed ischemic and nonischemic cardiomyopathy. LVEF 34%. Underwent CRT-D on 11/02.    Diuresed with IV lasix + diamox + metolazone during admit. IV lasix switched to Torsemide 60 mg BID day of discharge (prior home torsemide 40 mg BID). Counseled extensively on cutting back fluid intake. Continued on GDMT with digoxin, spironolactone, entresto, farxiga and metoprolol succinate. Had hypokalemia which was corrected prior to d/c. K supplement prescribed.  Repeat K was 4.7, ok to discharge per Dr. Haroldine Laws. Discussed with Dr. Haroldine Laws, will discharge on 40 mg BID of potassium chloride. Will need BMET on follow up. Note, patient was previously on 178m spironolactone in early Oct, now he is on 232mdaily.   Seen by PT. HH/PT and O2 recommended and were arranged.  Discharge Weight Range: 287 - 280 Discharge Vitals: Blood pressure (!) 109/58, pulse 80, temperature 97.6 F (36.4 C), temperature source Oral, resp. rate 16, height 6' (1.829 m), weight 127.4 kg, SpO2 95 %.  Labs: Lab Results  Component Value Date   WBC 4.8 05/09/2021   HGB 12.8 (L) 05/09/2021   HCT 40.3 05/09/2021   MCV 93.7 05/09/2021   PLT 146 (L) 05/09/2021    Recent Labs  Lab 05/09/21 1834  NA 135  K 4.7  CL 93*  CO2 32  BUN 42*  CREATININE 1.58*  CALCIUM 8.6*  GLUCOSE 209*   No results found for: CHOL, HDL, LDLCALC, TRIG BNP (last 3 results) Recent Labs    04/13/21 1724 04/17/21 0646 05/01/21 0304  BNP 305.0* 382.0* 498.7*    ProBNP (last 3 results) No results for input(s): PROBNP in the last 8760 hours.   Diagnostic Studies/Procedures   DG Chest 2 View  Result Date: 05/08/2021 CLINICAL DATA:  Defibrillator placement. EXAM: CHEST - 2 VIEW COMPARISON:  April 13, 2021. FINDINGS: Stable cardiomegaly. Interval placement of left-sided defibrillator with leads in grossly good position. No pneumothorax is noted. Lungs are clear. Bony thorax is unremarkable. IMPRESSION: Interval placement of left-sided defibrillator. No pneumothorax is noted.  Electronically Signed   By: Marijo Conception M.D.   On: 05/08/2021 08:58    Discharge Medications   Allergies as of 05/09/2021       Reactions   Codeine Anaphylaxis   Tightness in chest        Medication List     TAKE these medications    Accu-Chek Aviva Plus test strip Generic drug: glucose blood   albuterol (2.5 MG/3ML) 0.083% nebulizer solution Commonly known as: PROVENTIL Take 3 mLs (2.5 mg total)  by nebulization every 6 (six) hours as needed for wheezing or shortness of breath.   albuterol 108 (90 Base) MCG/ACT inhaler Commonly known as: VENTOLIN HFA USE 2 INHALATIONS BY MOUTH  EVERY 6 HOURS AS NEEDED FOR WHEEZING OR SHORTNESS OF  BREATH   apixaban 5 MG Tabs tablet Commonly known as: ELIQUIS Take 1 tablet (5 mg total) by mouth 2 (two) times daily. Start taking on: May 10, 2021   aspirin 81 MG tablet Take 81 mg by mouth daily.   b complex vitamins tablet Take 1 tablet by mouth in the morning and at bedtime.   CENTRUM SILVER PO Take 1 tablet by mouth daily.   COLD AND FLU PO Take 1 tablet by mouth daily as needed (cough aches).   digoxin 0.125 MG tablet Commonly known as: LANOXIN Take 1 tablet (0.125 mg total) by mouth daily.   diphenhydramine-acetaminophen 25-500 MG Tabs tablet Commonly known as: TYLENOL PM Take 1 tablet by mouth at bedtime as needed (sleep).   Entresto 24-26 MG Generic drug: sacubitril-valsartan Take 1 tablet by mouth 2 (two) times daily.   ezetimibe 10 MG tablet Commonly known as: ZETIA Take 10 mg by mouth daily.   Farxiga 10 MG Tabs tablet Generic drug: dapagliflozin propanediol Take 1 tablet (10 mg total) by mouth daily.   folic acid 638 MCG tablet Commonly known as: FOLVITE Take 400 mcg by mouth daily.   guaiFENesin 600 MG 12 hr tablet Commonly known as: MUCINEX Take 1,200 mg by mouth 2 (two) times daily.   HumaLOG KwikPen 200 UNIT/ML KwikPen Generic drug: insulin lispro Inject 20 Units into the skin every evening.   insulin detemir 100 UNIT/ML injection Commonly known as: LEVEMIR Inject 30 Units into the skin daily.   loratadine 10 MG tablet Commonly known as: CLARITIN Take 10 mg by mouth daily.   MELATONIN PO Take 5 mg by mouth at bedtime as needed (sleep).   metoprolol 200 MG 24 hr tablet Commonly known as: TOPROL-XL Take 1 tablet (200 mg total) by mouth daily. Take with or immediately following a meal.    omega-3 acid ethyl esters 1 g capsule Commonly known as: LOVAZA Take by mouth in the morning, at noon, and at bedtime. What changed: Another medication with the same name was added. Make sure you understand how and when to take each.   omega-3 acid ethyl esters 1 g capsule Commonly known as: LOVAZA Take 2 capsules (2 g total) by mouth 2 (two) times daily. What changed: You were already taking a medication with the same name, and this prescription was added. Make sure you understand how and when to take each.   ONE TOUCH ULTRA 2 w/Device Kit by Does not apply route.   oxymetazoline 0.05 % nasal spray Commonly known as: AFRIN Place 1 spray into both nostrils 2 (two) times daily.   potassium chloride SA 20 MEQ tablet Commonly known as: KLOR-CON Take 2 tablets (40 mEq total) by mouth 2 (two) times  daily.   rosuvastatin 40 MG tablet Commonly known as: CRESTOR Take 40 mg by mouth daily.   SERTRALINE HCL PO Take 150 mg by mouth daily.   spironolactone 25 MG tablet Commonly known as: ALDACTONE Take 1 tablet (25 mg total) by mouth daily. Start taking on: May 10, 2021 What changed: how much to take   Torsemide 60 MG Tabs Take 60 mg by mouth 2 (two) times daily. Start taking on: May 10, 2021 What changed:  medication strength how much to take   Trelegy Ellipta 100-62.5-25 MCG/ACT Aepb Generic drug: Fluticasone-Umeclidin-Vilant Inhale 1 puff into the lungs daily. Patient needs 90 day script with 3 refills.   vitamin C 500 MG tablet Commonly known as: ASCORBIC ACID Take 500 mg by mouth daily.   Vitamin D3 50 MCG (2000 UT) Tabs Take 1 capsule by mouth in the morning and at bedtime.   Zinc Methionate 50 MG Caps Take 1 capsule by mouth daily.               Durable Medical Equipment  (From admission, onward)           Start     Ordered   05/09/21 1410  For home use only DME oxygen  Once       Comments: ICD I50.23, J44.9 CHF, COPD  POC CPAP bleed in   Question Answer Comment  Length of Need Lifetime   Mode or (Route) Nasal cannula   Liters per Minute 2   Frequency Continuous (stationary and portable oxygen unit needed)   Oxygen conserving device Yes   Oxygen delivery system Gas      05/09/21 1425            Disposition   The patient will be discharged in stable condition to home. Discharge Instructions     (HEART FAILURE PATIENTS) Call MD:  Anytime you have any of the following symptoms: 1) 3 pound weight gain in 24 hours or 5 pounds in 1 week 2) shortness of breath, with or without a dry hacking cough 3) swelling in the hands, feet or stomach 4) if you have to sleep on extra pillows at night in order to breathe.   Complete by: As directed    Diet - low sodium heart healthy   Complete by: As directed    Heart Failure patients record your daily weight using the same scale at the same time of day   Complete by: As directed    Increase activity slowly   Complete by: As directed        Follow-up Information     [facility] Follow up.   Why: on 11/14 at 245 pm for post defibrillator check Contact information: [place] [address] [place] Follow up.   Why: Home Health Physical Therapy, and RN-agency will call to scheduel appt Contact information: [address] [place] Follow up.   Why: will deliver concentator to your home Contact information: [place] [address] [place] Follow up on 05/14/2021.   Specialty: Cardiology Why: Advanced Heart Failure Clinic at [facility], 3pm Entrance C, Garage Code 4503 Contact information: [address] [phone] [place] [phone]  Duration of Discharge Encounter: Greater than 35 minutes   Signed, Almyra Deforest  05/09/2021, 8:12 PM

## 2021-05-09 NOTE — Progress Notes (Signed)
Mobility Specialist Progress Note    05/09/21 1244  Mobility  Activity Ambulated in hall  Level of Assistance Modified independent, requires aide device or extra time  Assistive Device [facility] Ambulated (ft) 160 ft  Mobility Ambulated with assistance in hallway  Mobility Response Tolerated well  Mobility performed by Mobility specialist  $Mobility charge 1 Mobility   Pt received in bed and agreeable. No complaints on walk. Took a short standing break at nurse's station with HR in 90s and some DOE. Returned to [facility] with wife present.   Hildred Alamin Mobility Specialist  Mobility Specialist Phone: [phone]

## 2021-05-09 NOTE — Progress Notes (Signed)
   Came to check on patient. He is getting his 3 run of Kcl. Very eager to go home tonight.   Will draw stat labs. If K >= 3.5 can go home. Otherwise will continue to supplement and plan d/c to home tomorrow.   Glori Bickers, MD  6:22 PM

## 2021-05-09 NOTE — Progress Notes (Addendum)
Advanced Heart Failure Rounding Note  PCP-Cardiologist: Rozann Lesches, MD   Subjective:    cMRI EF 34%, findings c/w mixed ischemic/NICM (see below)  Underwent CRT-D placement 11/02. Did not get AV nodal ablation.   V paced 80s, underlying rhythm afib  CXR today w/ no PTX. Device pocket stable.   -4L yesterday. On 80 mg lasix IV TID + diamox + 2.5 metolazone  Scr 1.2 > 1.4 > 1.5  K 2.8.  cMRI 05/07/21  IMPRESSION: 1. Mixed cardiomyopathy with evidence of ischemic and nonischemic etiologies   2. Subendocardial late gadolinium enhancement consistent with prior infarcts at apex, basal to mid inferolateral, and mid anterolateral walls. LGE is greater than 50% transmural at the apex, suggesting nonviability. Remainder of myocardium appears viable.   3. Basal septal midwall LGE, which is a scar pattern seen in nonischemic cardiomyopathies and associated with a worse prognosis. Also with mid myocardial LGE in basal anterior wall   4. Elevated myocardial T2 values (up to 39ms in basal inferior wall), suggesting myocardial edema   5. Normal LV size with moderate systolic dysfunction (EF 28%). Septal dyskinesis consistent with LBBB.   6.  Normal RV size and systolic function (EF 78%)   7.  Dilated ascending aorta measuring 32mm    Objective:   Weight Range: 127.4 kg Body mass index is 38.09 kg/m.   Vital Signs:   Temp:  [98.4 F (36.9 C)-98.6 F (37 C)] 98.6 F (37 C) (11/04 0442) Pulse Rate:  [80-88] 80 (11/04 0442) Resp:  [16-19] 16 (11/04 0442) BP: (104-129)/(57-66) 129/66 (11/04 0442) SpO2:  [94 %-100 %] 100 % (11/04 0442) Weight:  [127.4 kg] 127.4 kg (11/04 0442) Last BM Date: 05/08/21  Weight change: Filed Weights   05/07/21 0547 05/08/21 0521 05/09/21 0442  Weight: 128.5 kg 127.5 kg 127.4 kg    Intake/Output:   Intake/Output Summary (Last 24 hours) at 05/09/2021 0949 Last data filed at 05/09/2021 0443 Gross per 24 hour  Intake 540 ml   Output 4600 ml  Net -4060 ml      Physical Exam     General:  Sitting up in bed, no distress HEENT: normal Neck: supple. no JVD. Carotids 2+ bilat; no bruits. No lymphadenopathy or thryomegaly appreciated. Cor: PMI nondisplaced. Regular rate & rhythm. No rubs, gallops or murmurs. Dressing over ICD pocket left upper chest. Lungs: diminished Abdomen: soft, nontender, nondistended. No hepatosplenomegaly. No bruits or masses. Good bowel sounds. Extremities: no cyanosis, clubbing, rash, 1+ edema, TED hose on Neuro: alert & orientedx3, cranial nerves grossly intact. moves all 4 extremities w/o difficulty. Affect pleasant     Telemetry   Afib 80s, V paced, personally reviewed    Labs    CBC Recent Labs    05/08/21 0245 05/09/21 0459  WBC 5.2 4.8  HGB 12.9* 12.8*  HCT 40.7 40.3  MCV 94.7 93.7  PLT 145* 676*   Basic Metabolic Panel Recent Labs    05/08/21 0245 05/09/21 0459  NA 140 140  K 3.4* 2.8*  CL 98 97*  CO2 32 35*  GLUCOSE 148* 192*  BUN 29* 36*  CREATININE 1.43* 1.54*  CALCIUM 8.9 8.8*  MG 2.3  --    Liver Function Tests No results for input(s): AST, ALT, ALKPHOS, BILITOT, PROT, ALBUMIN in the last 72 hours. No results for input(s): LIPASE, AMYLASE in the last 72 hours. Cardiac Enzymes No results for input(s): CKTOTAL, CKMB, CKMBINDEX, TROPONINI in the last 72 hours.  BNP: BNP (last 3  results) Recent Labs    04/13/21 1724 04/17/21 0646 05/01/21 0304  BNP 305.0* 382.0* 498.7*    ProBNP (last 3 results) No results for input(s): PROBNP in the last 8760 hours.   D-Dimer No results for input(s): DDIMER in the last 72 hours. Hemoglobin A1C No results for input(s): HGBA1C in the last 72 hours. Fasting Lipid Panel No results for input(s): CHOL, HDL, LDLCALC, TRIG, CHOLHDL, LDLDIRECT in the last 72 hours. Thyroid Function Tests No results for input(s): TSH, T4TOTAL, T3FREE, THYROIDAB in the last 72 hours.  Invalid input(s): FREET3  Other  results:   Imaging    No results found.   Medications:     Scheduled Medications:  acetaZOLAMIDE  250 mg Oral BID   albuterol  2.5 mg Nebulization BID   aspirin  81 mg Oral Daily   dapagliflozin propanediol  10 mg Oral Daily   digoxin  0.125 mg Oral Daily   ezetimibe  10 mg Oral Daily   feeding supplement  237 mL Oral BID BM   fluticasone  1 spray Each Nare QHS   fluticasone furoate-vilanterol  1 puff Inhalation Daily   And   umeclidinium bromide  1 puff Inhalation Daily   folic acid  626 mcg Oral Daily   furosemide  80 mg Intravenous TID with meals   guaiFENesin  1,200 mg Oral BID   insulin aspart  0-20 Units Subcutaneous TID WC   insulin aspart  10 Units Subcutaneous QPM   insulin detemir  15 Units Subcutaneous QHS   loratadine  10 mg Oral Daily   metolazone  2.5 mg Oral Daily   metoprolol  200 mg Oral Daily   omega-3 acid ethyl esters  2 g Oral BID   potassium chloride  40 mEq Oral BID   potassium chloride  40 mEq Oral Q1 Hr x 2   rosuvastatin  40 mg Oral QHS   sacubitril-valsartan  1 tablet Oral BID   sertraline  150 mg Oral Daily   sodium chloride flush  3 mL Intravenous Q12H   spironolactone  25 mg Oral Daily    Infusions:  sodium chloride      PRN Medications: sodium chloride, acetaminophen, albuterol, ondansetron (ZOFRAN) IV, ondansetron (ZOFRAN) IV, sodium chloride flush, zolpidem    Patient Profile   Tommy Riley is a 75 y.o. male with history of chronic systolic and diastolic HF, permanent AF, CAD s/p CABG, CKD, DM. Admitted with second heart failure exacerbation in the past month.  Assessment/Plan   1. Acute on chronic systolic HF: - EF 94-85% on echo, RV mildly reduced - Etiology of LV dysfunction unclear but cMRI suggest mixed ischemic/nonischemic CM - Has diuresed well. Renal function bumped slightly. Will give IV lasix again this am. Plan for discharge later today. Increase home torsemide to 60 mg BID at discharge. Will stop diamox and  metolazone. May need metolazone prn.  - Long discussion about need to limit high fluid intake at home - I think this is the majority of his problem  - May benefit from [place] Entresto 24-26 mg bid today. If renal fx remains stable will gradually titrate as outpatient. Slight bump in Scr with diuresis. - Continue Spiro 25 mg daily  - Continue Toprol 200 daily - Consider Farxiga 10 mg daily    2. CAD s/p CABG - grafts patent on cath 05/05/21   3. 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 Needs weight loss   4. Chronic AF with LBBB and intermittent rate control  - Has been in AF > 10 years despite previous AF/AFL ablations - Reportedly has had problems with rate control - Now controlled on digoxin and Toprol  - s/p CRT-D implant 11/2 - Could consider AV nodal ablation in the future if difficult to rate control  - Restart Eliquis 11/05 per EP   5. OSA - on CPAP   6. Morbid obesity - needs weight loss.   7. Hypokalemia - K 2.8 this am. Mg ok at 2.3  - Given 80 mEq K this am. K still 2.7 this afternoon. Will continue to supplement this afternoon. - c/w spiro   Initially planned for discharge today. Will plan to send home tomorrow once K repleted. Transition off IV lasix to po Torsemide.  HH PT at discharge. Qualifies for home 02.   Length of Stay: [place], Muniz, PA-C  05/09/2021, 9:49 AM  Advanced Heart Failure Team Pager [phone] (M-F; 7a - 5p)  Please contact [facility] for night-coverage after hours (5p -7a ) and weekends on [email or web address]  Patient seen and examined with the above-signed Advanced Practice Provider and/or Housestaff. I personally reviewed laboratory data, imaging studies and relevant notes. I independently examined the patient and formulated the important aspects of the plan. I have  edited the note to reflect any of my changes or salient points. I have personally discussed the plan with the patient and/or family.  Diuresed over 4L yesterday but weight only down one more pound (about 7 pounds total). Breathing better. Denies SOB, orthopnea or PND. Scr worsening slowly. Remains biv paced   General:  Obese male in chair No resp difficulty HEENT: normal Neck: supple. Hard to see jvp Carotids 2+ bilat; no bruits. No lymphadenopathy or thryomegaly appreciated. Cor: PMI nondisplaced. Irregular rate & rhythm. No rubs, gallops or murmurs. ICD dressing ok  Lungs: clear Abdomen: obese  soft, nontender, nondistended. No hepatosplenomegaly. No bruits or masses. Good bowel sounds. Extremities: no cyanosis, clubbing, rash, 1+ edema + wraps  Neuro: alert & orientedx3, cranial nerves grossly intact. moves all 4 extremities w/o difficulty. Affect pleasant  Weight down only 1 pound despite brisk diuresis. Suspect high intake will remain an issue. That said, I think that this is about as good as we can get him. Will switch to po torsemide. Supp K aggressively. I was originally hopeful that we could get him home today but with persistent hypokalemia we will have to wait until tomorrow.   Glori Bickers, MD  5:41 PM

## 2021-05-12 ENCOUNTER — Encounter (HOSPITAL_COMMUNITY): Payer: Medicare Other

## 2021-05-12 ENCOUNTER — Telehealth: Payer: Self-pay | Admitting: Cardiology

## 2021-05-12 NOTE — Telephone Encounter (Signed)
[place] with [facility] is requesting to have orders approved for patient to begin home health 2x/2 weeks and then 1x/4 weeks with 2 PRN for medication education and monitoring vitals. Please return call to discuss at [phone].

## 2021-05-13 NOTE — Telephone Encounter (Signed)
Advised that per last phone note, orders approved

## 2021-05-13 NOTE — Telephone Encounter (Signed)
Tommy Riley with [facility] calling back requesting a verbal order to approve starting physical therapy Monday. She says they do not have a therapist this week. Phone: [phone]

## 2021-05-14 ENCOUNTER — Institutional Professional Consult (permissible substitution): Payer: Medicare Other | Admitting: Cardiology

## 2021-05-14 ENCOUNTER — Encounter (HOSPITAL_COMMUNITY): Payer: Self-pay

## 2021-05-14 ENCOUNTER — Ambulatory Visit (HOSPITAL_COMMUNITY)
Admit: 2021-05-14 | Discharge: 2021-05-14 | Disposition: A | Discharge status: Home / Self Care | Payer: Medicare Other | Attending: Family Medicine | Admitting: Family Medicine

## 2021-05-14 VITALS — BP 100/60 | HR 81 | Wt 271.4 lb

## 2021-05-14 DIAGNOSIS — I2510 Atherosclerotic heart disease of native coronary artery without angina pectoris: Secondary | ICD-10-CM | POA: Diagnosis not present

## 2021-05-14 DIAGNOSIS — Z951 Presence of aortocoronary bypass graft: Secondary | ICD-10-CM | POA: Diagnosis not present

## 2021-05-14 DIAGNOSIS — I255 Ischemic cardiomyopathy: Secondary | ICD-10-CM | POA: Insufficient documentation

## 2021-05-14 DIAGNOSIS — I4819 Other persistent atrial fibrillation: Secondary | ICD-10-CM

## 2021-05-14 DIAGNOSIS — Z6836 Body mass index (BMI) 36.0-36.9, adult: Secondary | ICD-10-CM | POA: Insufficient documentation

## 2021-05-14 DIAGNOSIS — I5022 Chronic systolic (congestive) heart failure: Secondary | ICD-10-CM | POA: Insufficient documentation

## 2021-05-14 DIAGNOSIS — G4733 Obstructive sleep apnea (adult) (pediatric): Secondary | ICD-10-CM | POA: Diagnosis not present

## 2021-05-14 DIAGNOSIS — I4821 Permanent atrial fibrillation: Secondary | ICD-10-CM | POA: Insufficient documentation

## 2021-05-14 DIAGNOSIS — Z7984 Long term (current) use of oral hypoglycemic drugs: Secondary | ICD-10-CM | POA: Insufficient documentation

## 2021-05-14 DIAGNOSIS — Z7901 Long term (current) use of anticoagulants: Secondary | ICD-10-CM | POA: Insufficient documentation

## 2021-05-14 DIAGNOSIS — Z7982 Long term (current) use of aspirin: Secondary | ICD-10-CM | POA: Insufficient documentation

## 2021-05-14 DIAGNOSIS — I25119 Atherosclerotic heart disease of native coronary artery with unspecified angina pectoris: Secondary | ICD-10-CM

## 2021-05-14 DIAGNOSIS — Z79899 Other long term (current) drug therapy: Secondary | ICD-10-CM | POA: Diagnosis not present

## 2021-05-14 DIAGNOSIS — Z9989 Dependence on other enabling machines and devices: Secondary | ICD-10-CM

## 2021-05-14 DIAGNOSIS — Z9981 Dependence on supplemental oxygen: Secondary | ICD-10-CM | POA: Insufficient documentation

## 2021-05-14 DIAGNOSIS — I272 Pulmonary hypertension, unspecified: Secondary | ICD-10-CM | POA: Insufficient documentation

## 2021-05-14 DIAGNOSIS — I130 Hypertensive heart and chronic kidney disease with heart failure and stage 1 through stage 4 chronic kidney disease, or unspecified chronic kidney disease: Secondary | ICD-10-CM | POA: Diagnosis present

## 2021-05-14 DIAGNOSIS — I447 Left bundle-branch block, unspecified: Secondary | ICD-10-CM | POA: Diagnosis not present

## 2021-05-14 DIAGNOSIS — E1122 Type 2 diabetes mellitus with diabetic chronic kidney disease: Secondary | ICD-10-CM | POA: Diagnosis not present

## 2021-05-14 LAB — BASIC METABOLIC PANEL
Anion gap: 8 (ref 5–15)
BUN: 38 mg/dL — ABNORMAL HIGH (ref 8–23)
CO2: 33 mmol/L — ABNORMAL HIGH (ref 22–32)
Calcium: 9.1 mg/dL (ref 8.9–10.3)
Chloride: 100 mmol/L (ref 98–111)
Creatinine, Ser: 1.36 mg/dL — ABNORMAL HIGH (ref 0.61–1.24)
GFR, Estimated: 55 mL/min — ABNORMAL LOW (ref >60)
Glucose, Bld: 116 mg/dL — ABNORMAL HIGH (ref 70–99)
Potassium: 3.9 mmol/L (ref 3.5–5.1)
Sodium: 141 mmol/L (ref 135–145)

## 2021-05-14 LAB — DIGOXIN LEVEL: Digoxin Level: 0.9 ng/mL (ref 0.8–2.0)

## 2021-05-14 MED ORDER — SPIRONOLACTONE 25 MG PO TABS: 25.0000 mg | ORAL_TABLET | Freq: Every day | ORAL | 5 refills | Status: DC

## 2021-05-14 MED ORDER — DIGOXIN 125 MCG PO TABS: 0.1250 mg | ORAL_TABLET | Freq: Every day | ORAL | 6 refills | Status: DC

## 2021-05-14 NOTE — Telephone Encounter (Signed)
Advised this is fine

## 2021-05-14 NOTE — Progress Notes (Addendum)
ADVANCED HF CLINIC CONSULT NOTE   Primary Care: Pomposini, [name], MD HF Cardiologist: Dr. Haroldine Laws  HPI: Tommy Riley is a 75 y.o. male with history of chronic systolic HF, ischemic cardiomyopathy, CAD s/p CABG in 2001, permanent atrial fibrillation s/p atrial fibrillation and atrial flutter ablation in 2001, OSA on CPAP, DM II.    Previously seen by Dr. Gilles Chiquito at [facility] for pulmonary hypertension in 2018. Felt to be multifactorial and likely combination of WHO groups 2 and 3.    Admitted to [facility] 10/09-10/15/22 with a/c HF. Echo with LVEF 35-40%, RV mildly reduced, RVSP 66 mmHg, trivial MR.   Directly admitted from Cardiology clinic on 04/30/21 with a/c CHF. Diuresed with IV lasix, diamox, and metolazone.  Marland Kitchen Has LBBB with wide QRS. Seen by EP and unclear how much he would benefit from CRT. Underwent L/RHC  with patent grafts (Y graft from LIMA to distal LAD and distal circumflex marginal branch, vein graft to PDA) and showed pulmonary hypertension, CO preserved. Pulmonary HTN felt be d/t combination of WHO Group II and III. cMRI 11/02 with evidence of mixed ischemic and nonischemic cardiomyopathy. LVEF 34%. Underwent CRT-D on 11/02, did not get AV nodal ablation. IV lasix switched to Torsemide. Counseled extensively on cutting back fluid intake. Continued on GDMT with digoxin, spironolactone, entresto, farxiga and metoprolol succinate.  Discharge weight 280 lbs.  Today he returns for post hospitalization HF follow up with his wife. No SOB with walking on flat ground. Feels much better since discharge. Denies  CP, dizziness, edema, or PND/Orthopnea. Appetite ok. No fever or chills. Weight at home 268 pounds. Taking all medications. Limiting fluids and salty foods. Wears CPAP nightly and currently on 2L oxygen continuously. Asking when he can shower.  Review of Systems: [y] = yes, _0  = no   General: Weight gain _1 ; Weight loss _2 ; Anorexia _3 ; Fatigue _4 ; Fever _5 ; Chills _6 ;  Weakness Blue.Reese ]  Cardiac: Chest pain/pressure _7 ; Resting SOB _8 ; Exertional SOB [ y]; Orthopnea _9 ; Pedal Edema _10 ; Palpitations _11 ; Syncope _12 ; Presyncope _13 ; Paroxysmal nocturnal dyspnea_14   Pulmonary: Cough _15 ; Wheezing_16 ; Hemoptysis_17 ; Sputum _18 ; Snoring _19   GI: Vomiting_20 ; Dysphagia_21 ; Melena_22 ; Hematochezia _23 ; Heartburn_24 ; Abdominal pain _25 ; Constipation _26 ; Diarrhea _27 ; BRBPR _28   GU: Hematuria_29 ; Dysuria _30 ; Nocturia_31   Vascular: Pain in legs with walking _32 ; Pain in feet with lying flat _33 ; Non-healing sores _34 ; Stroke _35 ; TIA _36 ; Slurred speech _37 ;  Neuro: Headaches_38 ; Vertigo_39 ; Seizures_40 ; Paresthesias_41 ;Blurred vision _42 ; Diplopia _43 ; Vision changes _44   Ortho/Skin: Arthritis _45 ; Joint pain _46 ; Muscle pain _47 ; Joint swelling _48 ; Back Pain _49 ; Rash _50   Psych: Depression_51 ; Anxiety_52   Heme: Bleeding problems _53 ; Clotting disorders _54 ; Anemia _55   Endocrine: Diabetes Blue.Reese ]; Thyroid dysfunction_56   Past Medical History:  Diagnosis Date   Anxiety    Atrial fibrillation and flutter (HCC)    Atrial fibrillation ablation 2011 at [facility]   CAD in native artery    Chronic systolic heart failure (HCC)    CKD (chronic kidney disease), stage III (HCC)    COPD (chronic obstructive pulmonary disease) ([place])    COVID-22 February 2020   Depression  Essential hypertension    Gout    Hyperlipidemia    Ischemic heart disease    OSA (obstructive sleep apnea)    Stroke [facility]) 2003   Type 2 diabetes mellitus (HCC)     Current Outpatient Medications  Medication Sig Dispense Refill   ACCU-CHEK AVIVA PLUS test strip      albuterol (PROVENTIL) (2.5 MG/3ML) 0.083% nebulizer solution Take 3 mLs (2.5 mg total) by nebulization every 6 (six) hours as needed for wheezing or shortness of breath. 75 mL 4   albuterol (VENTOLIN HFA) 108 (90 Base) MCG/ACT inhaler USE 2 INHALATIONS BY MOUTH  EVERY 6 HOURS AS NEEDED FOR WHEEZING OR SHORTNESS OF  BREATH 54 g 1    apixaban (ELIQUIS) 5 MG TABS tablet Take 1 tablet (5 mg total) by mouth 2 (two) times daily. 60 tablet 5   aspirin 81 MG tablet Take 81 mg by mouth daily.     b complex vitamins tablet Take 1 tablet by mouth in the morning and at bedtime.     Blood Glucose Monitoring Suppl (ONE TOUCH ULTRA 2) W/DEVICE KIT by Does not apply route.     Cholecalciferol (VITAMIN D3) 2000 UNITS TABS Take 1 capsule by mouth in the morning and at bedtime.     digoxin (LANOXIN) 0.125 MG tablet Take 1 tablet (0.125 mg total) by mouth daily. 30 tablet 0   diphenhydramine-acetaminophen (TYLENOL PM) 25-500 MG TABS tablet Take 2 tablets by mouth at bedtime.     ezetimibe (ZETIA) 10 MG tablet Take 10 mg by mouth daily.     FARXIGA 10 MG TABS tablet Take 1 tablet (10 mg total) by mouth daily. 30 tablet 90   Fexofenadine HCl (MUCINEX ALLERGY PO) Take 1,200 mg of amoxicillin by mouth daily.     Fluticasone-Umeclidin-Vilant (TRELEGY ELLIPTA) 100-62.5-25 MCG/INH AEPB Inhale 1 puff into the lungs daily. Patient needs 90 day script with 3 refills. 992 each 3   folic acid (FOLVITE) 426 MCG tablet Take 400 mcg by mouth daily.     insulin detemir (LEVEMIR) 100 UNIT/ML injection Inject 50 Units into the skin as needed. Up to 50 units     insulin lispro (HUMALOG) 100 UNIT/ML injection Inject 20 Units into the skin as needed for high blood sugar. Up to 20 units     loratadine (CLARITIN) 10 MG tablet Take 10 mg by mouth daily.     MELATONIN PO Take 5 mg by mouth at bedtime as needed (sleep).     metoprolol succinate (TOPROL-XL) 200 MG 24 hr tablet Take 1 tablet (200 mg total) by mouth daily. Take with or immediately following a meal. 30 tablet 0   Multiple Vitamins-Minerals (CENTRUM SILVER PO) Take 1 tablet by mouth daily.      omega-3 acid ethyl esters (LOVAZA) 1 G capsule Take by mouth in the morning, at noon, and at bedtime.     omega-3 acid ethyl esters (LOVAZA) 1 g capsule Take 2 capsules (2 g total) by mouth 2 (two) times daily.      oxymetazoline (AFRIN) 0.05 % nasal spray Place 1 spray into both nostrils 2 (two) times daily.     potassium chloride SA (KLOR-CON) 20 MEQ tablet Take 2 tablets (40 mEq total) by mouth 2 (two) times daily. 120 tablet 3   rosuvastatin (CRESTOR) 40 MG tablet Take 40 mg by mouth daily.     sacubitril-valsartan (ENTRESTO) 24-26 MG Take 1 tablet by mouth 2 (two) times daily. 180 tablet 3   SERTRALINE  HCL PO Take 150 mg by mouth daily.     spironolactone (ALDACTONE) 25 MG tablet Take 1 tablet (25 mg total) by mouth daily. 30 tablet 5   torsemide 60 MG TABS Take 60 mg by mouth 2 (two) times daily. 60 tablet 5   vitamin C (ASCORBIC ACID) 500 MG tablet Take 500 mg by mouth daily.     Zinc Methionate 50 MG CAPS Take 1 capsule by mouth daily.     No current facility-administered medications for this encounter.   Allergies  Allergen Reactions   Codeine Anaphylaxis    Tightness in chest   Social History   Socioeconomic History   Marital status: Married    Spouse name: Slayden Mennenga   Number of children: 3   Years of education: 14   Highest education level: Some college, no degree  Occupational History    Comment: retired  Tobacco Use   Smoking status: Former    Packs/day: 2.50    Years: 50.00    Pack years: 125.00    Types: Cigarettes    Quit date: 06/17/2011    Years since quitting: 9.9   Smokeless tobacco: Never  Vaping Use   Vaping Use: Some days   Start date: 07/06/2017   Last attempt to quit: 04/19/2021   Substances: Flavoring  Substance and Sexual Activity   Alcohol use: No    Comment: 1 beer a month   Drug use: No   Sexual activity: Not on file  Other Topics Concern   Not on file  Social History Narrative   Consumes no caffeine   Social Determinants of Health   Financial Resource Strain: Low Risk    Difficulty of Paying Living Expenses: Not hard at all  Food Insecurity: No Food Insecurity   Worried About Charity fundraiser in the Last Year: Never true   Ran Out of  Food in the Last Year: Never true  Transportation Needs: No Transportation Needs   Lack of Transportation (Medical): No   Lack of Transportation (Non-Medical): No  Physical Activity: Not on file  Stress: Not on file  Social Connections: Not on file  Intimate Partner Violence: Not on file   Family History  Problem Relation Age of Onset   Raynaud syndrome Mother    Stroke Father    Diabetes Mellitus II Father    Hypertension Father    Hypertension Sister    Diabetes Mellitus II Sister    Stroke Brother    Crohn's disease Brother    Breast cancer Brother    COPD Brother    BP 100/60   Pulse 81   Wt 123.1 kg (271 lb 6.4 oz)   SpO2 96% Comment: 2 l N/C  BMI 36.81 kg/m   Wt Readings from Last 3 Encounters:  05/14/21 123.1 kg (271 lb 6.4 oz)  05/09/21 127.4 kg (280 lb 13.9 oz)  04/30/21 130.8 kg (288 lb 6.4 oz)   PHYSICAL EXAM: General:  NAD. No resp difficulty, arrived in [facility] on oxygen. HEENT: Normal Neck: Supple. Thick neck. Carotids 2+ bilat; no bruits. No lymphadenopathy or thryomegaly appreciated. Cor: PMI nondisplaced. Regular rate & rhythm. No rubs, gallops or murmurs. Lungs: Clear, dressing to ICD pocket removed, steri strips in place, minimal swelling & ecchymosis. Abdomen: Obese, nontender, nondistended. No hepatosplenomegaly. No bruits or masses. Good bowel sounds. Extremities: No cyanosis, clubbing, rash, 1+ BLE edema, TED hose on Neuro: Alert & oriented x 3, cranial nerves grossly intact. Moves all 4 extremities w/o  difficulty. Affect pleasant.  ECG: BiV pacing with PVC (personally reviewed).  ASSESSMENT & PLAN:  1.Chronic systolic HF: - EF 31-42% on echo, RV mildly reduced - Etiology of LV dysfunction unclear but cMRI suggest mixed ischemic/nonischemic CM - NYHA III, volume OK today, weight down 9 lbs. - No BP room for GDMT titration. - Continue torsemide 60 mg bid for now. May need metolazone prn.  - Continue Entresto 24-26 mg bid. - Continue spiro 25 mg  daily  - Continue Toprol 200 mg daily. - Continue Farxiga 10 mg daily.  - Continue digoxin (rate control). - He is doing better with limiting fluids to <2L/day, wife is helping. - May benefit from [place]. - Labs today.   2. CAD s/p CABG - Grafts patent on cath 05/05/21. - No chest pain. - Continue ASA + beta blocker + statin.   3. Pulmonary HTN - RHC 05/05/21:  RA 14 RV 68/7 PA 68/unrecorded (40) PCWP unobtained LVEDP 21 Fick  4.9/2.0  PVR (using LVEDP) = 3.9 WU - Mixed PH WHO Group II & III. - Continue torsemide and CPAP.  - Needs weight loss.   4. Chronic AF with LBBB and intermittent rate control  - Has been in AF > 10 years despite previous AF/AFL ablations - Rate controlled on digoxin + Toprol  - s/p CRT-D implant 11/22. No data yet on interrogation. - Consider AV nodal ablation in the future if difficult to rate control  - Continue Eliquis 5 mg bid. No bleeding issues. - CBC today. - Spoke to [place], [place], no showering until wound check at EP visit next week.    5. OSA - On CPAP. - Continue nightly use.   6. Morbid obesity - Body mass index is 36.81 kg/m. - Needs weight loss.   Follow up with APP in 4 weeks and Dr. Haroldine Laws in 3 months + echo.  Allena Katz, FNP-BC 05/14/21

## 2021-05-14 NOTE — Patient Instructions (Signed)
It was great to see you today! No medication changes are needed at this time.  Labs today We will only contact you if something comes back abnormal or we need to make some changes. Otherwise no news is good news!  Your physician recommends that you schedule a follow-up appointment in: 4 weeks  in the Advanced Practitioners (PA/NP) Clinic and in 4 months with Dr Haroldine Laws and echo  Your physician has requested that you have an echocardiogram. Echocardiography is a painless test that uses sound waves to create images of your heart. It provides your doctor with information about the size and shape of your heart and how well your heart's chambers and valves are working. This procedure takes approximately one hour. There are no restrictions for this procedure.    Do the following things EVERYDAY: Weigh yourself in the morning before breakfast. Write it down and keep it in a log. Take your medicines as prescribed Eat low salt foods--Limit salt (sodium) to 2000 mg per day.  Stay as active as you can everyday Limit all fluids for the day to less than 2 liters  At the [facility], you and your health needs are our priority. As part of our continuing mission to provide you with exceptional heart care, we have created designated Provider Care Teams. These Care Teams include your primary Cardiologist (physician) and Advanced Practice Providers (APPs- Physician Assistants and Nurse Practitioners) who all work together to provide you with the care you need, when you need it.   You may see any of the following providers on your designated Care Team at your next follow up: Dr Glori Bickers Dr Haynes Kerns, NP Lyda Jester, [place] [facility] [place], [place] Audry Riles, PharmD   Please be sure to bring in all your medications bottles to every appointment.    If you have any questions or concerns before your next appointment please send [place] a message through  [place] or call our office at [phone].    TO LEAVE A MESSAGE FOR THE NURSE SELECT OPTION 2, PLEASE LEAVE A MESSAGE INCLUDING: YOUR NAME DATE OF BIRTH CALL BACK NUMBER REASON FOR CALL**this is important as we prioritize the call backs  YOU WILL RECEIVE A CALL BACK THE SAME DAY AS LONG AS YOU CALL BEFORE 4:00 PM

## 2021-05-15 NOTE — Addendum Note (Signed)
Encounter addended by: Rafael Bihari, FNP on: 05/15/2021 10:17 AM  Actions taken: Clinical Note Signed

## 2021-05-16 ENCOUNTER — Telehealth (HOSPITAL_COMMUNITY): Payer: Self-pay

## 2021-05-16 DIAGNOSIS — I5022 Chronic systolic (congestive) heart failure: Secondary | ICD-10-CM

## 2021-05-16 NOTE — Telephone Encounter (Signed)
Patient advised and verbalized understanding. Patient will have repeat blood work done at [facility] in [place], [place]

## 2021-05-16 NOTE — Telephone Encounter (Signed)
-----   Message from Rafael Bihari, [place] sent at 05/14/2021  5:17 PM EST ----- Dig level elevated. Please have him repeat dig level on Monday, but hold digoxin dose the morning of to get a trough.  Kidney function stable

## 2021-05-17 NOTE — Progress Notes (Signed)
Cardiology Office Note Date:  05/19/2021  Patient ID:  Tommy Riley, DOB 05-01-1946, MRN [id number] PCP:  Clinton Quant, MD  Cardiologist:  Dr. Domenic Polite Electrophysiologist: Dr. Lovena Le    Chief Complaint:  wound check   History of Present Illness: Tommy Riley is a 75 y.o. male with history of AFib/Flutter (s/p ablation at [facility] 2011) now described as permanent, CAD (remote CABG), chronic CHF (mixed ischemic/NICM), CKD (III), HTN, HLD, morbid obesity, LBBB, p.HTN, hx stroke, DM  Of late with increasing difficulties with HF exacerbation, admitted last to [facility] 05/04/21 with recurrent HF reduction in LVEF and [facility] noted patent grafts Seen by EP and AHF team while there. Underwent GDMT and volume optimization and ICD implanted and AV node ablated. Discharged 05/09/21  TODAY He is accompanied by his wife He saw HF team last week in hospital follow up Remains on O2, though told that would likely not be forever, he is complaint with his CPAP at night No new symptoms No near syncope or syncope No CP SOB/DOE is minimal with average walking pace of his, he uses a cane  No bleeding or signs of bleeding     Device information BSci CRT ICD implanted 05/07/21, Dr. Lovena Le Left bundle lead is in RA port Defib lead in RV port CS lead S/p AV node ablation Programmed AAIR (Left bundle pacing)  Past Medical History:  Diagnosis Date   Anxiety    Atrial fibrillation and flutter ([place])    Atrial fibrillation ablation [facility] at [facility]   CAD in native artery    Chronic systolic heart failure ([place])    CKD (chronic kidney disease), stage III ([place])    COPD (chronic obstructive pulmonary disease) ([place])    COVID-22 February 2020   Depression    Essential hypertension    Gout    Hyperlipidemia    Ischemic heart disease    OSA (obstructive sleep apnea)    Stroke ([place]) 2003   Type 2 diabetes mellitus ([place])     Past Surgical History:  Procedure Laterality Date   ATRIAL  FIBRILLATION ABLATION  2011   [facility]   AV NODE ABLATION N/A 05/07/2021   Procedure: AV NODE ABLATION;  Surgeon: Evans Lance, MD;  Location: [facility];  Service: Cardiovascular;  Laterality: N/A;   CATARACT EXTRACTION Bilateral    CORONARY ARTERY BYPASS GRAFT  2001   HERNIA REPAIR Right    ICD IMPLANT N/A 05/07/2021   Procedure: ICD IMPLANT;  Surgeon: Evans Lance, MD;  Location: [facility];  Service: Cardiovascular;  Laterality: N/A;   RIGHT/LEFT HEART CATH AND CORONARY/GRAFT ANGIOGRAPHY N/A 05/05/2021   Procedure: RIGHT/LEFT HEART CATH AND CORONARY/GRAFT ANGIOGRAPHY;  Surgeon: Lorretta Harp, MD;  Location: [place] [facility];  Service: Cardiovascular;  Laterality: N/A;   TONSILLECTOMY AND ADENOIDECTOMY      Current Outpatient Medications  Medication Sig Dispense Refill   ACCU-CHEK AVIVA PLUS test strip      albuterol (PROVENTIL) (2.5 MG/3ML) 0.083% nebulizer solution Take 3 mLs (2.5 mg total) by nebulization every 6 (six) hours as needed for wheezing or shortness of breath. 75 mL 4   albuterol (VENTOLIN HFA) 108 (90 Base) MCG/ACT inhaler USE 2 INHALATIONS BY MOUTH  EVERY 6 HOURS AS NEEDED FOR WHEEZING OR SHORTNESS OF  BREATH 54 g 1   apixaban (ELIQUIS) 5 MG TABS tablet Take 1 tablet (5 mg total) by mouth 2 (two) times daily. 60 tablet 5  aspirin 81 MG tablet Take 81 mg by mouth daily.     b complex vitamins tablet Take 1 tablet by mouth in the morning and at bedtime.     Blood Glucose Monitoring Suppl (ONE TOUCH ULTRA 2) W/DEVICE KIT by Does not apply route.     Cholecalciferol (VITAMIN D3) 2000 UNITS TABS Take 1 capsule by mouth in the morning and at bedtime.     digoxin (LANOXIN) 0.125 MG tablet Take 1 tablet (0.125 mg total) by mouth daily. 30 tablet 6   diphenhydramine-acetaminophen (TYLENOL PM) 25-500 MG TABS tablet Take 2 tablets by mouth at bedtime.     ezetimibe (ZETIA) 10 MG tablet Take 10 mg by mouth daily.     FARXIGA 10 MG TABS tablet Take 1 tablet (10  mg total) by mouth daily. 30 tablet 90   Fexofenadine HCl (MUCINEX ALLERGY PO) Take 1,200 mg of amoxicillin by mouth daily.     Fluticasone-Umeclidin-Vilant (TRELEGY ELLIPTA) 100-62.5-25 MCG/INH AEPB Inhale 1 puff into the lungs daily. Patient needs 90 day script with 3 refills. 177 each 3   folic acid (FOLVITE) 939 MCG tablet Take 400 mcg by mouth daily.     insulin detemir (LEVEMIR) 100 UNIT/ML injection Inject 50 Units into the skin as needed. Up to 50 units     insulin lispro (HUMALOG) 100 UNIT/ML injection Inject 20 Units into the skin as needed for high blood sugar. Up to 20 units     loratadine (CLARITIN) 10 MG tablet Take 10 mg by mouth daily.     MELATONIN PO Take 5 mg by mouth at bedtime as needed (sleep).     metoprolol succinate (TOPROL-XL) 200 MG 24 hr tablet Take 1 tablet (200 mg total) by mouth daily. Take with or immediately following a meal. 30 tablet 0   Multiple Vitamins-Minerals (CENTRUM SILVER PO) Take 1 tablet by mouth daily.      omega-3 acid ethyl esters (LOVAZA) 1 g capsule Take 2 capsules (2 g total) by mouth 2 (two) times daily.     oxymetazoline (AFRIN) 0.05 % nasal spray Place 1 spray into both nostrils 2 (two) times daily.     potassium chloride SA (KLOR-CON) 20 MEQ tablet Take 2 tablets (40 mEq total) by mouth 2 (two) times daily. 120 tablet 3   rosuvastatin (CRESTOR) 40 MG tablet Take 40 mg by mouth daily.     sacubitril-valsartan (ENTRESTO) 24-26 MG Take 1 tablet by mouth 2 (two) times daily. 180 tablet 3   SERTRALINE HCL PO Take 150 mg by mouth daily.     spironolactone (ALDACTONE) 25 MG tablet Take 1 tablet (25 mg total) by mouth daily. 30 tablet 5   torsemide 60 MG TABS Take 60 mg by mouth 2 (two) times daily. 60 tablet 5   vitamin C (ASCORBIC ACID) 500 MG tablet Take 500 mg by mouth daily.     Zinc Methionate 50 MG CAPS Take 1 capsule by mouth daily.     omega-3 acid ethyl esters (LOVAZA) 1 G capsule Take by mouth in the morning, at noon, and at bedtime.  (Patient not taking: Reported on 05/19/2021)     No current facility-administered medications for this visit.    Allergies:   Codeine   Social History:  The patient  reports that he quit smoking about 9 years ago. His smoking use included cigarettes. He has a 125.00 pack-year smoking history. He has never used smokeless tobacco. He reports that he does not drink alcohol and does  not use drugs.   Family History:  The patient's family history includes Breast cancer in his brother; COPD in his brother; Crohn's disease in his brother; Diabetes Mellitus II in his father and sister; Hypertension in his father and sister; Raynaud syndrome in his mother; Stroke in his brother and father.  ROS:  Please see the history of present illness.    All other systems are reviewed and otherwise negative.   PHYSICAL EXAM:  VS:  BP (!) 110/58   Pulse 80   Ht 5' 11.5" (1.816 m)   Wt 272 lb 6.4 oz (123.6 kg)   SpO2 98% Comment: On 2L of oxygen  BMI 37.46 kg/m  BMI: Body mass index is 37.46 kg/m. Well nourished, well developed, in no acute distress HEENT: normocephalic, atraumatic Neck: no JVD, carotid bruits or masses Cardiac:  RRR; no significant murmurs, no rubs, or gallops Lungs:  CTA b/l, no wheezing, rhonchi or rales Abd: soft, nontender MS: no deformity or atrophy Ext: wearing compression socks, trace edema appreciated only, he has chronic looking b/l UE forearm bruising no edema Skin: warm and dry, no rash Neuro:  No gross deficits appreciated Psych: euthymic mood, full affect  ICD site steri strips are removed without difficulty, very small skin tear at the superior aspect of the suture line, though skin edges are wel healed, he has peri-site ecchymosis in later stages of coloration, no hematoma, no bleeding, no t erythematous or warm, no signs of infection, is nontender  EKG:  not done today  Device interrogation done today and reviewed by myself:  He is programmed AAIR 80 A channel (Left  bundle lead) and RV  (apex/ICD lead) measurements are good LV lead threshold today is up 5.0V/1.106m Base pacing rate reduced to 70 today   cMRI 05/07/21   IMPRESSION: 1. Mixed cardiomyopathy with evidence of ischemic and nonischemic etiologies 2. Subendocardial late gadolinium enhancement consistent with prior infarcts at apex, basal to mid inferolateral, and mid anterolateral walls. LGE is greater than 50% transmural at the apex, suggesting nonviability. Remainder of myocardium appears viable 3. Basal septal midwall LGE, which is a scar pattern seen in nonischemic cardiomyopathies and associated with a worse prognosis. Also with mid myocardial LGE in basal anterior wall 4. Elevated myocardial T2 values (up to 660min basal inferior wall), suggesting myocardial edema 5. Normal LV size with moderate systolic dysfunction (EF 3420% Septal dyskinesis consistent with LBBB. 6.  Normal RV size and systolic function (EF 5394%7.  Dilated ascending aorta measuring 4084m 05/05/21: R/LHC HEMODYNAMICS:  1: Right atrial pressure-15/17 2: Right ventricular pressure-68/7 3: Pulmonary artery [id number]-62stolic, mean 40 4: [id number] Cardiac output-4.9 L/min with an index of 2 L/min/m by Fick   IMPRESSION: Mr. LavOtilio Sabers patent grafts including a vein to the PDA and a Y graft originating from the internal mammary artery to the distal LAD and distal circumflex marginal branch.  He does have pulmonary hypertension.  His right common femoral vein was closed with a Mynx closure device.  The right common femoral artery will be manually pulled and held.  The anatomy has been reviewed with Dr. TayLovena Leo is anticipating biventricular pacing (CRT).  The patient left lab in stable condition.   04/14/21: TTE IMPRESSIONS   1. Left ventricular ejection fraction, by estimation, is 35 to 40%. The  left ventricle has moderately decreased function. The left ventricle  demonstrates global hypokinesis.  There is mild left ventricular  hypertrophy. Left ventricular diastolic  parameters  are indeterminate. In Afib with RVR during study, consider  repeat limited echo to better evaluate systolic function once rates better  controlled.   2. Right ventricular systolic function is mildly reduced. The right  ventricular size is normal. There is severely elevated pulmonary artery  systolic pressure. The estimated right ventricular systolic pressure is  68.1 mmHg.   3. Left atrial size was severely dilated.   4. Right atrial size was severely dilated.   5. The mitral valve is normal in structure. Trivial mitral valve  regurgitation.   6. The aortic valve is tricuspid. Aortic valve regurgitation is not  visualized. No aortic stenosis is present.   7. The inferior vena cava is dilated in size with <50% respiratory  variability, suggesting right atrial pressure of 15 mmHg.    Recent Labs: 04/14/2021: TSH 0.541 05/01/2021: ALT 43; B Natriuretic Peptide 498.7 05/09/2021: Hemoglobin 12.8; Magnesium 2.6; Platelets 146 05/14/2021: BUN 38; Creatinine, Ser 1.36; Potassium 3.9; Sodium 141  No results found for requested labs within last 8760 hours.   Estimated Creatinine Clearance: 66.2 mL/min (A) (by C-G formula based on SCr of 1.36 mg/dL (H)).   Wt Readings from Last 3 Encounters:  05/19/21 272 lb 6.4 oz (123.6 kg)  05/14/21 271 lb 6.4 oz (123.1 kg)  05/09/21 280 lb 13.9 oz (127.4 kg)     Other studies reviewed: Additional studies/records reviewed today include: summarized above  ASSESSMENT AND PLAN:  CRT-D LV lead thresholds are up CXR today Base rate reduced post AVnode ablation 80>70, no other programming changes made  Chronic CHF Mixed CM (ischemic/nonischemic) No heart failure data yet on device C/w HF team and Dr. Domenic Polite  Permanent AFib CHA2DS2Vasc is 7, on Eliquis, appropriately dosed S/p AV node ablation   Disposition: F/u with Dr. Lovena Le as scheduled, pending CXR  findings  Current medicines are reviewed at length with the patient today.  The patient did not have any concerns regarding medicines.  Venetia Night, PA-C 05/19/2021 3:39 PM     [address] [place] [address] [phone] (office)  [phone] (fax)

## 2021-05-19 ENCOUNTER — Other Ambulatory Visit: Payer: Self-pay

## 2021-05-19 ENCOUNTER — Ambulatory Visit (INDEPENDENT_AMBULATORY_CARE_PROVIDER_SITE_OTHER): Payer: Medicare Other | Admitting: Physician Assistant

## 2021-05-19 ENCOUNTER — Encounter: Payer: Self-pay | Admitting: Physician Assistant

## 2021-05-19 ENCOUNTER — Ambulatory Visit
Admission: RE | Admit: 2021-05-19 | Discharge: 2021-05-19 | Disposition: A | Discharge status: Home / Self Care | Payer: Medicare Other | Source: Ambulatory Visit | Attending: Physician Assistant | Admitting: Physician Assistant

## 2021-05-19 VITALS — BP 110/58 | HR 80 | Ht 71.5 in | Wt 272.4 lb

## 2021-05-19 DIAGNOSIS — Z95810 Presence of automatic (implantable) cardiac defibrillator: Secondary | ICD-10-CM | POA: Diagnosis not present

## 2021-05-19 DIAGNOSIS — T82120A Displacement of cardiac electrode, initial encounter: Secondary | ICD-10-CM

## 2021-05-19 DIAGNOSIS — I5022 Chronic systolic (congestive) heart failure: Secondary | ICD-10-CM | POA: Diagnosis not present

## 2021-05-19 DIAGNOSIS — I5023 Acute on chronic systolic (congestive) heart failure: Secondary | ICD-10-CM | POA: Diagnosis not present

## 2021-05-19 DIAGNOSIS — Z5189 Encounter for other specified aftercare: Secondary | ICD-10-CM

## 2021-05-19 DIAGNOSIS — I4821 Permanent atrial fibrillation: Secondary | ICD-10-CM

## 2021-05-19 LAB — CUP PACEART INCLINIC DEVICE CHECK
Date Time Interrogation Session: 20221114163000
HighPow Impedance: 65 Ohm
Implantable Lead Implant Date: 20221103
Implantable Lead Implant Date: 20221103
Implantable Lead Implant Date: 20221103
Implantable Lead Location: 753858
Implantable Lead Location: 753860
Implantable Lead Location: 753860
Implantable Lead Model: 138
Implantable Lead Model: 3830
Implantable Lead Model: 4671
Implantable Lead Serial Number: 304581
Implantable Lead Serial Number: 852093
Implantable Pulse Generator Implant Date: 20221103
Lead Channel Impedance Value: 557 Ohm
Lead Channel Impedance Value: 589 Ohm
Lead Channel Impedance Value: 600 Ohm
Lead Channel Pacing Threshold Amplitude: 0.5 V
Lead Channel Pacing Threshold Amplitude: 0.6 V
Lead Channel Pacing Threshold Amplitude: 5 V
Lead Channel Pacing Threshold Pulse Width: 0.4 ms
Lead Channel Pacing Threshold Pulse Width: 0.4 ms
Lead Channel Pacing Threshold Pulse Width: 1 ms
Lead Channel Sensing Intrinsic Amplitude: 18.2 mV
Lead Channel Sensing Intrinsic Amplitude: 6.7 mV
Lead Channel Setting Pacing Amplitude: 3.5 V
Lead Channel Setting Sensing Sensitivity: 0.5 mV
Lead Channel Setting Sensing Sensitivity: 1 mV
Pulse Gen Serial Number: 390312

## 2021-05-19 NOTE — Patient Instructions (Addendum)
Medication Instructions:   Your physician recommends that you continue on your current medications as directed. Please refer to the Current Medication list given to you today.   *If you need a refill on your cardiac medications before your next appointment, please call your pharmacy*   Lab Work:  [place]   If you have labs (blood work) drawn today and your tests are completely normal, you will receive your results only by: [place] (if you have MyChart) OR A paper copy in the mail If you have any lab test that is abnormal or we need to change your treatment, we will call you to review the results.   Testing/Procedures: A chest x-ray takes a picture of the organs and structures inside the chest, including the heart, lungs, and blood vessels. This test can show several things, including, whether the heart is enlarges; whether fluid is building up in the lungs; and whether pacemaker / defibrillator leads are still in place.  [address] [facility] 1st level [facility] )   Follow-Up: At [facility], you and your health needs are our priority.  As part of our continuing mission to provide you with exceptional heart care, we have created designated Provider Care Teams.  These Care Teams include your primary Cardiologist (physician) and Advanced Practice Providers (APPs -  Physician Assistants and Nurse Practitioners) who all work together to provide you with the care you need, when you need it.  We recommend signing up for the patient portal called "MyChart".  Sign up information is provided on this After Visit Summary.  MyChart is used to connect with patients for Virtual Visits (Telemedicine).  Patients are able to view lab/test results, encounter notes, upcoming appointments, etc.  Non-urgent messages can be sent to your provider as well.   To learn more about what you can do with MyChart, go to [email or web address].    Your next appointment:  AS  SCHEDULED    The format for your next appointment:   In Person  Provider:   Cristopher Peru, MD    Other Instructions

## 2021-05-20 ENCOUNTER — Telehealth: Payer: Self-pay | Admitting: *Deleted

## 2021-05-20 LAB — DIGOXIN LEVEL: Digoxin, Serum: 0.7 ng/mL (ref 0.5–0.9)

## 2021-05-20 NOTE — Telephone Encounter (Signed)
-----   Message from [facility], [place] sent at 05/20/2021  8:50 AM EST ----- CXR was reviewed with Dr. Lovena Le, lead position looks stable Will follow up measurements at his visit with Dr. Lovena Le as scheduled.  Please put a not on his appointment for that day to check ALL LV lead vector thresholds

## 2021-05-20 NOTE — Telephone Encounter (Signed)
Lvm of results of chest xray and will take a look in area of leads at follow up visit. Left Clinic number [phone]) directly to me if any questions or concerns.

## 2021-05-21 ENCOUNTER — Telehealth: Payer: Self-pay | Admitting: Cardiology

## 2021-05-21 NOTE — Telephone Encounter (Signed)
Sharee Pimple advised that orders have been approved by [employer]

## 2021-05-21 NOTE — Telephone Encounter (Signed)
Tommy Riley, from [facility] is calling for PT orders for 1 time a week for 5 weeks.   Tommy Riley can be reached at [phone]

## 2021-06-02 ENCOUNTER — Other Ambulatory Visit (HOSPITAL_COMMUNITY): Payer: Self-pay

## 2021-06-02 MED ORDER — DIGOXIN 125 MCG PO TABS: 0.1250 mg | ORAL_TABLET | Freq: Every day | ORAL | 1 refills | Status: DC

## 2021-06-04 ENCOUNTER — Other Ambulatory Visit: Payer: Self-pay | Admitting: *Deleted

## 2021-06-04 MED ORDER — POTASSIUM CHLORIDE CRYS ER 20 MEQ PO TBCR: 40.0000 meq | EXTENDED_RELEASE_TABLET | Freq: Two times a day (BID) | ORAL | 3 refills | Status: DC

## 2021-06-06 ENCOUNTER — Telehealth: Payer: Self-pay | Admitting: Cardiology

## 2021-06-06 NOTE — Telephone Encounter (Signed)
Noted  

## 2021-06-06 NOTE — Telephone Encounter (Signed)
Latoria from [facility] called to make [place] aware that patient has some leaking of fluid through his skin, clear liquid. No complains of SOB, he does have upper extremity swelling.  Patient has appt for this coming Monday.

## 2021-06-09 ENCOUNTER — Other Ambulatory Visit: Payer: Self-pay

## 2021-06-09 ENCOUNTER — Encounter: Payer: Self-pay | Admitting: Cardiology

## 2021-06-09 ENCOUNTER — Ambulatory Visit: Payer: Medicare Other | Admitting: Cardiology

## 2021-06-09 VITALS — BP 128/64 | HR 70 | Ht 71.5 in | Wt 274.0 lb

## 2021-06-09 DIAGNOSIS — I25119 Atherosclerotic heart disease of native coronary artery with unspecified angina pectoris: Secondary | ICD-10-CM

## 2021-06-09 DIAGNOSIS — I4821 Permanent atrial fibrillation: Secondary | ICD-10-CM

## 2021-06-09 DIAGNOSIS — I5020 Unspecified systolic (congestive) heart failure: Secondary | ICD-10-CM | POA: Diagnosis not present

## 2021-06-09 MED ORDER — POTASSIUM CHLORIDE CRYS ER 20 MEQ PO TBCR: 40.0000 meq | EXTENDED_RELEASE_TABLET | Freq: Two times a day (BID) | ORAL | 3 refills | Status: DC

## 2021-06-09 MED ORDER — DIGOXIN 125 MCG PO TABS: 0.1250 mg | ORAL_TABLET | Freq: Every day | ORAL | 3 refills | Status: DC

## 2021-06-09 MED ORDER — SPIRONOLACTONE 25 MG PO TABS: 25.0000 mg | ORAL_TABLET | Freq: Every day | ORAL | 3 refills | Status: DC

## 2021-06-09 MED ORDER — METOPROLOL SUCCINATE ER 200 MG PO TB24: 200.0000 mg | ORAL_TABLET | Freq: Every day | ORAL | 3 refills | Status: DC

## 2021-06-09 MED ORDER — ROSUVASTATIN CALCIUM 40 MG PO TABS: 40.0000 mg | ORAL_TABLET | Freq: Every day | ORAL | 3 refills | Status: DC

## 2021-06-09 NOTE — Patient Instructions (Addendum)
Medication Instructions:  Your physician recommends that you continue on your current medications as directed. Please refer to the Current Medication list given to you today.  Labwork: none  Testing/Procedures: none  Follow-Up: Your physician recommends that you schedule a follow-up appointment in: 6 weeks  Any Other Special Instructions Will Be Listed Below (If Applicable).  If you need a refill on your cardiac medications before your next appointment, please call your pharmacy. 

## 2021-06-09 NOTE — Progress Notes (Signed)
Cardiology Office Note  Date: 06/09/2021   ID: [name], DOB 1945-08-07, MRN [id number]  PCP:  Clinton Quant, MD  Cardiologist:  Rozann Lesches, MD Electrophysiologist:  Vickie Epley, MD   Chief Complaint  Patient presents with   Cardiac follow-up    History of Present Illness: Tommy Riley is a 75 y.o. male just recently seen in the EP clinic on November 14 status post CRT-D and hospitalization with acute on chronic HFrEF and permanent atrial fibrillation.  He has a mixed cardiomyopathy (ischemic/nonischemic), was seen by the advanced heart failure team during hospital stay in November, I reviewed the notes.  He was found to have patent bypass grafts at cardiac catheterization in October.  Also has pulmonary hypertension, WHO group 2 and 3.  He has a Chemical engineer CRT-D in place per Dr. Lovena Le.  Recent device interrogation revealed 93% pacing.  He is here for a follow-up visit.  Overall, doing better.  He has had some leg swelling, but Lasix was increased to 80 mg twice daily about a week ago by his nephrologist and he is starting to feel better.  Still using compression stockings.  He will have a follow-up BMET with his nephrologist in about 3 weeks.  Weight at home has come down from 273 to 270 pounds.  I reviewed his medications which are noted below and otherwise stable from a cardiac perspective following recent hospital adjustments.  He does not report any palpitations.  No bleeding problems on Eliquis.  Past Medical History:  Diagnosis Date   Anxiety    Atrial fibrillation and flutter ([place])    Atrial fibrillation ablation 2011 at [facility]   CAD in native artery    Chronic systolic heart failure (HCC)    CKD (chronic kidney disease), stage III (HCC)    COPD (chronic obstructive pulmonary disease) ([place])    COVID-22 February 2020   Depression    Essential hypertension    Gout    Hyperlipidemia    Ischemic heart disease    OSA (obstructive  sleep apnea)    Stroke ([place]) 2003   Type 2 diabetes mellitus ([place])     Past Surgical History:  Procedure Laterality Date   ATRIAL FIBRILLATION ABLATION  2011   [facility]   AV NODE ABLATION N/A 05/07/2021   Procedure: AV NODE ABLATION;  Surgeon: Evans Lance, MD;  Location: [place] [facility];  Service: Cardiovascular;  Laterality: N/A;   CATARACT EXTRACTION Bilateral    CORONARY ARTERY BYPASS GRAFT  2001   HERNIA REPAIR Right    ICD IMPLANT N/A 05/07/2021   Procedure: ICD IMPLANT;  Surgeon: Evans Lance, MD;  Location: [place] [facility];  Service: Cardiovascular;  Laterality: N/A;   RIGHT/LEFT HEART CATH AND CORONARY/GRAFT ANGIOGRAPHY N/A 05/05/2021   Procedure: RIGHT/LEFT HEART CATH AND CORONARY/GRAFT ANGIOGRAPHY;  Surgeon: Lorretta Harp, MD;  Location: [place] [facility];  Service: Cardiovascular;  Laterality: N/A;   TONSILLECTOMY AND ADENOIDECTOMY      Current Outpatient Medications  Medication Sig Dispense Refill   ACCU-CHEK AVIVA PLUS test strip      albuterol (PROVENTIL) (2.5 MG/3ML) 0.083% nebulizer solution Take 3 mLs (2.5 mg total) by nebulization every 6 (six) hours as needed for wheezing or shortness of breath. 75 mL 4   albuterol (VENTOLIN HFA) 108 (90 Base) MCG/ACT inhaler USE 2 INHALATIONS BY MOUTH  EVERY 6 HOURS AS NEEDED FOR WHEEZING OR SHORTNESS OF  BREATH 54  g 1   apixaban (ELIQUIS) 5 MG TABS tablet Take 1 tablet (5 mg total) by mouth 2 (two) times daily. 60 tablet 5   aspirin 81 MG tablet Take 81 mg by mouth daily.     Blood Glucose Monitoring Suppl (ONE TOUCH ULTRA 2) W/DEVICE KIT by Does not apply route.     Cholecalciferol (VITAMIN D3) 2000 UNITS TABS Take 1 capsule by mouth in the morning and at bedtime.     diphenhydramine-acetaminophen (TYLENOL PM) 25-500 MG TABS tablet Take 2 tablets by mouth at bedtime.     ezetimibe (ZETIA) 10 MG tablet Take 10 mg by mouth daily.     FARXIGA 10 MG TABS tablet Take 1 tablet (10 mg total) by mouth daily. 30 tablet 90    Fexofenadine HCl (MUCINEX ALLERGY PO) Take 1,200 mg of amoxicillin by mouth daily.     Fluticasone-Umeclidin-Vilant (TRELEGY ELLIPTA) 100-62.5-25 MCG/INH AEPB Inhale 1 puff into the lungs daily. Patient needs 90 day script with 3 refills. 707 each 3   folic acid (FOLVITE) 615 MCG tablet Take 400 mcg by mouth daily.     insulin detemir (LEVEMIR) 100 UNIT/ML injection Inject 50 Units into the skin as needed. Up to 50 units     insulin lispro (HUMALOG) 100 UNIT/ML injection Inject 20 Units into the skin as needed for high blood sugar. Up to 20 units     loratadine (CLARITIN) 10 MG tablet Take 10 mg by mouth daily.     MELATONIN PO Take 5 mg by mouth at bedtime as needed (sleep).     Multiple Vitamins-Minerals (CENTRUM SILVER PO) Take 1 tablet by mouth daily.      omega-3 acid ethyl esters (LOVAZA) 1 g capsule Take 2 capsules (2 g total) by mouth 2 (two) times daily.     oxymetazoline (AFRIN) 0.05 % nasal spray Place 1 spray into both nostrils 2 (two) times daily.     sacubitril-valsartan (ENTRESTO) 24-26 MG Take 1 tablet by mouth 2 (two) times daily. 180 tablet 3   SERTRALINE HCL PO Take 150 mg by mouth daily.     torsemide 60 MG TABS Take 60 mg by mouth 2 (two) times daily. 60 tablet 5   vitamin C (ASCORBIC ACID) 500 MG tablet Take 500 mg by mouth daily.     Zinc Methionate 50 MG CAPS Take 1 capsule by mouth daily.     digoxin (LANOXIN) 0.125 MG tablet Take 1 tablet (0.125 mg total) by mouth daily. 90 tablet 3   metoprolol (TOPROL-XL) 200 MG 24 hr tablet Take 1 tablet (200 mg total) by mouth daily. Take with or immediately following a meal. 90 tablet 3   potassium chloride SA (KLOR-CON M) 20 MEQ tablet Take 2 tablets (40 mEq total) by mouth 2 (two) times daily. 360 tablet 3   rosuvastatin (CRESTOR) 40 MG tablet Take 1 tablet (40 mg total) by mouth daily. 90 tablet 3   spironolactone (ALDACTONE) 25 MG tablet Take 1 tablet (25 mg total) by mouth daily. 90 tablet 3   No current facility-administered  medications for this visit.   Allergies:  Codeine   ROS: No palpitations or device discharges.  Physical Exam: VS:  BP 128/64   Pulse 70   Ht 5' 11.5" (1.816 m)   Wt 274 lb (124.3 kg)   SpO2 93%   BMI 37.68 kg/m , BMI Body mass index is 37.68 kg/m.  Wt Readings from Last 3 Encounters:  06/09/21 274 lb (124.3 kg)  05/19/21 272 lb 6.4 oz (123.6 kg)  05/14/21 271 lb 6.4 oz (123.1 kg)    General: Patient appears comfortable at rest. HEENT: Conjunctiva and lids normal, wearing a mask. Neck: Supple, no elevated JVP or carotid bruits, no thyromegaly Lungs: Clear to auscultation, nonlabored breathing at rest. Thorax: Well-healed device pocket site left upper chest, mild puffiness but no erythema or pain. Cardiac: Regular rate and rhythm, no S3, 1/6 systolic murmur, no pericardial rub. Abdomen: Soft, nontender, bowel sounds present. Extremities: 1-2+ edema with compression stockings in place. Skin: Warm and dry. Musculoskeletal: No kyphosis. Neuropsychiatric: Alert and oriented x3, affect grossly appropriate.  ECG:  An ECG dated 05/14/2021 was personally reviewed today and demonstrated:  Ventricular paced rhythm with nonspecific ST changes.  Recent Labwork: 04/14/2021: TSH 0.541 05/01/2021: ALT 43; AST 41; B Natriuretic Peptide 498.7 05/09/2021: Hemoglobin 12.8; Magnesium 2.6; Platelets 146 05/14/2021: BUN 38; Creatinine, Ser 1.36; Potassium 3.9; Sodium 141   Other Studies Reviewed Today:  Echocardiogram 04/14/2021:  1. Left ventricular ejection fraction, by estimation, is 35 to 40%. The  left ventricle has moderately decreased function. The left ventricle  demonstrates global hypokinesis. There is mild left ventricular  hypertrophy. Left ventricular diastolic  parameters are indeterminate. In Afib with RVR during study, consider  repeat limited echo to better evaluate systolic function once rates better  controlled.   2. Right ventricular systolic function is mildly reduced.  The right  ventricular size is normal. There is severely elevated pulmonary artery  systolic pressure. The estimated right ventricular systolic pressure is  08.6 mmHg.   3. Left atrial size was severely dilated.   4. Right atrial size was severely dilated.   5. The mitral valve is normal in structure. Trivial mitral valve  regurgitation.   6. The aortic valve is tricuspid. Aortic valve regurgitation is not  visualized. No aortic stenosis is present.   7. The inferior vena cava is dilated in size with <50% respiratory  variability, suggesting right atrial pressure of 15 mmHg.   Cardiac MRI 05/07/2021: IMPRESSION: 1. Mixed cardiomyopathy with evidence of ischemic and nonischemic etiologies   2. Subendocardial late gadolinium enhancement consistent with prior infarcts at apex, basal to mid inferolateral, and mid anterolateral walls. LGE is greater than 50% transmural at the apex, suggesting nonviability. Remainder of myocardium appears viable.   3. Basal septal midwall LGE, which is a scar pattern seen in nonischemic cardiomyopathies and associated with a worse prognosis. Also with mid myocardial LGE in basal anterior wall   4. Elevated myocardial T2 values (up to 37m in basal inferior wall), suggesting myocardial edema   5. Normal LV size with moderate systolic dysfunction (EF 357%. Septal dyskinesis consistent with LBBB.   6.  Normal RV size and systolic function (EF 584%   7.  Dilated ascending aorta measuring 411m  Assessment and Plan:  1.  HFrEF with chronic systolic heart failure and mixed cardiomyopathy with LVEF 35 to 40% based on recent work-up.  RV contraction normal although he does have pulmonary hypertension, WHO group 2 and 3.  Right-sided/peripheral edema present but improving following recent increase in Lasix.  No changes made today, he will get BMET in 3 weeks with his nephrologist.  Continue Entresto, Aldactone, Toprol-XL, and Farxiga.  Follow-up with Dr.  BeHaroldine Lawss scheduled.  2.  Permanent atrial fibrillation with left bundle branch block.  He is on Eliquis for stroke prophylaxis.  3.  Status post Bo[employer] MutualRT-D.  Recent device interrogation indicated 93% pacing.  4.  OSA on CPAP.  5.  Multivessel CAD status post CABG with recently documented patent bypass grafts and no active angina symptoms.  Continue statin therapy.  Medication Adjustments/Labs and Tests Ordered: Current medicines are reviewed at length with the patient today.  Concerns regarding medicines are outlined above.   Tests Ordered: No orders of the defined types were placed in this encounter.   Medication Changes: Meds ordered this encounter  Medications   rosuvastatin (CRESTOR) 40 MG tablet    Sig: Take 1 tablet (40 mg total) by mouth daily.    Dispense:  90 tablet    Refill:  3   potassium chloride SA (KLOR-CON M) 20 MEQ tablet    Sig: Take 2 tablets (40 mEq total) by mouth 2 (two) times daily.    Dispense:  360 tablet    Refill:  3   metoprolol (TOPROL-XL) 200 MG 24 hr tablet    Sig: Take 1 tablet (200 mg total) by mouth daily. Take with or immediately following a meal.    Dispense:  90 tablet    Refill:  3   spironolactone (ALDACTONE) 25 MG tablet    Sig: Take 1 tablet (25 mg total) by mouth daily.    Dispense:  90 tablet    Refill:  3   digoxin (LANOXIN) 0.125 MG tablet    Sig: Take 1 tablet (0.125 mg total) by mouth daily.    Dispense:  90 tablet    Refill:  3     Disposition:  Follow up  6 weeks.  Signed, Satira Sark, MD, [facility] 06/09/2021 2:19 PM    [facility] [place], [place], [place] [address] Phone: [phone]; Fax: [phone]

## 2021-06-10 NOTE — Progress Notes (Signed)
ADVANCED HF CLINIC CONSULT NOTE   Primary Care: Pomposini, [name], MD HF Cardiologist: Dr. Haroldine Laws  HPI: Brennin Durfee is a 75 y.o. male with history of chronic systolic HF, ischemic cardiomyopathy, CAD s/p CABG in 2001, permanent atrial fibrillation s/p atrial fibrillation and atrial flutter ablation in 2001, OSA on CPAP, DM II.    Previously seen by Dr. Gilles Chiquito at [facility] for pulmonary hypertension in 2018. Felt to be multifactorial and likely combination of WHO groups 2 and 3.    Admitted to [facility] 10/09-10/15/22 with a/c HF. Echo with LVEF 35-40%, RV mildly reduced, RVSP 66 mmHg, trivial MR.   Directly admitted from Cardiology clinic on 04/30/21 with a/c CHF. Diuresed with IV lasix, diamox, and metolazone.  Marland Kitchen Has LBBB with wide QRS. Seen by EP and unclear how much he would benefit from CRT. Underwent L/RHC  with patent grafts (Y graft from LIMA to distal LAD and distal circumflex marginal branch, vein graft to PDA) and showed pulmonary hypertension, CO preserved. Pulmonary HTN felt be d/t combination of WHO Group II and III. cMRI 11/02 with evidence of mixed ischemic and nonischemic cardiomyopathy. LVEF 34%. Underwent CRT-D on 11/02, did not get AV nodal ablation. IV lasix switched to Torsemide. Counseled extensively on cutting back fluid intake. Continued on GDMT with digoxin, spironolactone, entresto, farxiga and metoprolol succinate.  Discharge weight 280 lbs.  Today he returns for HF follow up. Overall feeling fine. Trying to cut down on fluids. No SOB walking on flat ground. Denies palpitations, abnormal bleeding, CP, dizziness, edema. Chronically sleeps reclined. Appetite ok. No fever or chills. Weight at home 272 pounds. Taking all medications. Wears CPAP nightly and currently on 2L oxygen continuously. Has a Malti-Poo named Lexi.  Past Medical History:  Diagnosis Date   Anxiety    Atrial fibrillation and flutter ([place])    Atrial fibrillation ablation 2011 at [facility]   CAD in  native artery    Chronic systolic heart failure (HCC)    CKD (chronic kidney disease), stage III (HCC)    COPD (chronic obstructive pulmonary disease) ([place])    COVID-22 February 2020   Depression    Essential hypertension    Gout    Hyperlipidemia    Ischemic heart disease    OSA (obstructive sleep apnea)    Stroke ([place]) 2003   Type 2 diabetes mellitus (HCC)    Current Outpatient Medications  Medication Sig Dispense Refill   ACCU-CHEK AVIVA PLUS test strip      albuterol (PROVENTIL) (2.5 MG/3ML) 0.083% nebulizer solution Take 3 mLs (2.5 mg total) by nebulization every 6 (six) hours as needed for wheezing or shortness of breath. 75 mL 4   albuterol (VENTOLIN HFA) 108 (90 Base) MCG/ACT inhaler USE 2 INHALATIONS BY MOUTH  EVERY 6 HOURS AS NEEDED FOR WHEEZING OR SHORTNESS OF  BREATH 54 g 1   apixaban (ELIQUIS) 5 MG TABS tablet Take 1 tablet (5 mg total) by mouth 2 (two) times daily. 60 tablet 5   aspirin 81 MG tablet Take 81 mg by mouth daily.     Blood Glucose Monitoring Suppl (ONE TOUCH ULTRA 2) W/DEVICE KIT by Does not apply route.     Cholecalciferol (VITAMIN D3) 2000 UNITS TABS Take 1 capsule by mouth in the morning and at bedtime.     digoxin (LANOXIN) 0.125 MG tablet Take 1 tablet (0.125 mg total) by mouth daily. 90 tablet 3   diphenhydramine-acetaminophen (TYLENOL PM) 25-500 MG TABS tablet Take 2  tablets by mouth at bedtime.     ezetimibe (ZETIA) 10 MG tablet Take 10 mg by mouth daily.     FARXIGA 10 MG TABS tablet Take 1 tablet (10 mg total) by mouth daily. 30 tablet 90   Fexofenadine HCl (MUCINEX ALLERGY PO) Take 1,200 mg of amoxicillin by mouth daily.     Fluticasone-Umeclidin-Vilant (TRELEGY ELLIPTA) 100-62.5-25 MCG/INH AEPB Inhale 1 puff into the lungs daily. Patient needs 90 day script with 3 refills. 390 each 3   folic acid (FOLVITE) 300 MCG tablet Take 400 mcg by mouth daily.     insulin detemir (LEVEMIR) 100 UNIT/ML injection Inject 50 Units into the skin as needed. Up to  50 units     insulin lispro (HUMALOG) 100 UNIT/ML injection Inject 20 Units into the skin as needed for high blood sugar. Up to 20 units     loratadine (CLARITIN) 10 MG tablet Take 10 mg by mouth daily.     MELATONIN PO Take 5 mg by mouth at bedtime as needed (sleep).     metoprolol (TOPROL-XL) 200 MG 24 hr tablet Take 1 tablet (200 mg total) by mouth daily. Take with or immediately following a meal. 90 tablet 3   Multiple Vitamins-Minerals (CENTRUM SILVER PO) Take 1 tablet by mouth daily.      omega-3 acid ethyl esters (LOVAZA) 1 g capsule Take 2 capsules (2 g total) by mouth 2 (two) times daily.     oxymetazoline (AFRIN) 0.05 % nasal spray Place 1 spray into both nostrils 2 (two) times daily.     potassium chloride SA (KLOR-CON M) 20 MEQ tablet Take 2 tablets (40 mEq total) by mouth 2 (two) times daily. 360 tablet 3   rosuvastatin (CRESTOR) 40 MG tablet Take 1 tablet (40 mg total) by mouth daily. 90 tablet 3   sacubitril-valsartan (ENTRESTO) 24-26 MG Take 1 tablet by mouth 2 (two) times daily. 180 tablet 3   SERTRALINE HCL PO Take 150 mg by mouth daily.     spironolactone (ALDACTONE) 25 MG tablet Take 1 tablet (25 mg total) by mouth daily. 90 tablet 3   torsemide 60 MG TABS Take 60 mg by mouth 2 (two) times daily. 60 tablet 5   Zinc Methionate 50 MG CAPS Take 1 capsule by mouth daily.     vitamin C (ASCORBIC ACID) 500 MG tablet Take 500 mg by mouth daily.     No current facility-administered medications for this encounter.   Allergies  Allergen Reactions   Codeine Anaphylaxis, Other (See Comments) and Shortness Of Breath    Tightness in chest Tightness in chest    Social History   Socioeconomic History   Marital status: Married    Spouse name: Kelvis Berger   Number of children: 3   Years of education: 14   Highest education level: Some college, no degree  Occupational History    Comment: retired  Tobacco Use   Smoking status: Former    Packs/day: 2.50    Years: 50.00    Pack  years: 125.00    Types: Cigarettes    Quit date: 06/17/2011    Years since quitting: 9.9   Smokeless tobacco: Never  Vaping Use   Vaping Use: Some days   Start date: 07/06/2017   Last attempt to quit: 04/19/2021   Substances: Flavoring  Substance and Sexual Activity   Alcohol use: No    Comment: 1 beer a month   Drug use: No   Sexual activity: Not on file  Other Topics Concern   Not on file  Social History Narrative   Consumes no caffeine   Social Determinants of Health   Financial Resource Strain: Low Risk    Difficulty of Paying Living Expenses: Not hard at all  Food Insecurity: No Food Insecurity   Worried About Charity fundraiser in the Last Year: Never true   Arboriculturist in the Last Year: Never true  Transportation Needs: No Transportation Needs   Lack of Transportation (Medical): No   Lack of Transportation (Non-Medical): No  Physical Activity: Not on file  Stress: Not on file  Social Connections: Not on file  Intimate Partner Violence: Not on file   Family History  Problem Relation Age of Onset   Raynaud syndrome Mother    Stroke Father    Diabetes Mellitus II Father    Hypertension Father    Hypertension Sister    Diabetes Mellitus II Sister    Stroke Brother    Crohn's disease Brother    Breast cancer Brother    COPD Brother    BP 134/64   Pulse 71   Wt 124.6 kg (274 lb 12.8 oz)   SpO2 90%   BMI 37.79 kg/m   Wt Readings from Last 3 Encounters:  06/13/21 124.6 kg (274 lb 12.8 oz)  06/09/21 124.3 kg (274 lb)  05/19/21 123.6 kg (272 lb 6.4 oz)   PHYSICAL EXAM: General:  NAD. No resp difficulty, walked into clinic w/ cane, on oxygen HEENT: Normal Neck: Supple. No JVD. Carotids 2+ bilat; no bruits. No lymphadenopathy or thryomegaly appreciated. Cor: PMI nondisplaced. Regular rate & rhythm. No rubs, gallops or murmurs. Lungs: Clear Abdomen: Obese, nontender, nondistended. No hepatosplenomegaly. No bruits or masses. Good bowel  sounds. Extremities: No cyanosis, clubbing, rash, trace LE edema L>R, compression hose on Neuro: Alert & oriented x 3, cranial nerves grossly intact. Moves all 4 extremities w/o difficulty. Affect pleasant.  Device interrogation: no HL score, thoracic impedence appears at baseline, 1 hr/day activity, no shocks (personally reviewed).  ASSESSMENT & PLAN:  1.Chronic systolic HF: - EF 93-79% on echo, RV mildly reduced - Etiology of LV dysfunction unclear but cMRI suggest mixed ischemic/nonischemic CM - Improved NYHA II, volume looks OK today on exam. - Continue torsemide 80 mg bid (increased recently by Nephrology). - Continue Entresto 24-26 mg bid. - Continue spiro 25 mg daily.  - Continue Toprol 200 mg daily. - Continue Farxiga 10 mg daily.  - Continue digoxin 0.125 mg daily (rate control). - He is doing better with limiting fluids to <2L/day, wife is helping. - May benefit from [place]. - BMET and dig today.   2. CAD s/p CABG - Grafts patent on cath 05/05/21. - No chest pain. - Continue ASA + beta blocker + statin.   3. Pulmonary HTN - RHC 05/05/21:  RA 14 RV 68/7 PA 68/unrecorded (40) PCWP unobtained LVEDP 21 Fick  4.9/2.0  PVR (using LVEDP) = 3.9 WU - Mixed PH WHO Group II & III. - Continue torsemide and CPAP.  - Needs weight loss.   4. Chronic AF with LBBB and intermittent rate control  - Has been in AF > 10 years despite previous AF/AFL ablations - Rate controlled on digoxin + Toprol  - s/p CRT-D implant 11/22. - Consider AV nodal ablation in the future if difficult to rate control.  - Continue Eliquis 5 mg bid. No bleeding issues.   5. OSA - On CPAP. - Continue nightly use.  6. Morbid obesity - Body mass index is 37.79 kg/m. - Needs weight loss.   Doing well! Follow up with Dr. Haroldine Laws in 2-3 months + echo.  Allena Katz, FNP-BC 06/13/21

## 2021-06-13 ENCOUNTER — Telehealth (HOSPITAL_COMMUNITY): Payer: Self-pay

## 2021-06-13 ENCOUNTER — Other Ambulatory Visit: Payer: Self-pay

## 2021-06-13 ENCOUNTER — Ambulatory Visit (HOSPITAL_COMMUNITY)
Admission: RE | Admit: 2021-06-13 | Discharge: 2021-06-13 | Disposition: A | Discharge status: Home / Self Care | Payer: Medicare Other | Source: Ambulatory Visit | Attending: Family Medicine | Admitting: Family Medicine

## 2021-06-13 ENCOUNTER — Encounter (HOSPITAL_COMMUNITY): Payer: Self-pay

## 2021-06-13 VITALS — BP 134/64 | HR 71 | Wt 274.8 lb

## 2021-06-13 DIAGNOSIS — Z7901 Long term (current) use of anticoagulants: Secondary | ICD-10-CM | POA: Diagnosis not present

## 2021-06-13 DIAGNOSIS — I4821 Permanent atrial fibrillation: Secondary | ICD-10-CM | POA: Diagnosis not present

## 2021-06-13 DIAGNOSIS — Z6837 Body mass index (BMI) 37.0-37.9, adult: Secondary | ICD-10-CM | POA: Diagnosis not present

## 2021-06-13 DIAGNOSIS — Z7984 Long term (current) use of oral hypoglycemic drugs: Secondary | ICD-10-CM | POA: Insufficient documentation

## 2021-06-13 DIAGNOSIS — I255 Ischemic cardiomyopathy: Secondary | ICD-10-CM | POA: Diagnosis not present

## 2021-06-13 DIAGNOSIS — I447 Left bundle-branch block, unspecified: Secondary | ICD-10-CM | POA: Diagnosis not present

## 2021-06-13 DIAGNOSIS — N183 Chronic kidney disease, stage 3 unspecified: Secondary | ICD-10-CM | POA: Insufficient documentation

## 2021-06-13 DIAGNOSIS — I130 Hypertensive heart and chronic kidney disease with heart failure and stage 1 through stage 4 chronic kidney disease, or unspecified chronic kidney disease: Secondary | ICD-10-CM | POA: Diagnosis not present

## 2021-06-13 DIAGNOSIS — Z951 Presence of aortocoronary bypass graft: Secondary | ICD-10-CM | POA: Insufficient documentation

## 2021-06-13 DIAGNOSIS — Z9989 Dependence on other enabling machines and devices: Secondary | ICD-10-CM

## 2021-06-13 DIAGNOSIS — I25119 Atherosclerotic heart disease of native coronary artery with unspecified angina pectoris: Secondary | ICD-10-CM | POA: Diagnosis not present

## 2021-06-13 DIAGNOSIS — I272 Pulmonary hypertension, unspecified: Secondary | ICD-10-CM

## 2021-06-13 DIAGNOSIS — G4733 Obstructive sleep apnea (adult) (pediatric): Secondary | ICD-10-CM

## 2021-06-13 DIAGNOSIS — Z79899 Other long term (current) drug therapy: Secondary | ICD-10-CM | POA: Insufficient documentation

## 2021-06-13 DIAGNOSIS — E1122 Type 2 diabetes mellitus with diabetic chronic kidney disease: Secondary | ICD-10-CM | POA: Diagnosis not present

## 2021-06-13 DIAGNOSIS — Z09 Encounter for follow-up examination after completed treatment for conditions other than malignant neoplasm: Secondary | ICD-10-CM | POA: Insufficient documentation

## 2021-06-13 DIAGNOSIS — Z8616 Personal history of COVID-19: Secondary | ICD-10-CM | POA: Diagnosis not present

## 2021-06-13 DIAGNOSIS — Z9981 Dependence on supplemental oxygen: Secondary | ICD-10-CM | POA: Insufficient documentation

## 2021-06-13 DIAGNOSIS — I5022 Chronic systolic (congestive) heart failure: Secondary | ICD-10-CM

## 2021-06-13 DIAGNOSIS — Z87891 Personal history of nicotine dependence: Secondary | ICD-10-CM | POA: Insufficient documentation

## 2021-06-13 DIAGNOSIS — I2510 Atherosclerotic heart disease of native coronary artery without angina pectoris: Secondary | ICD-10-CM | POA: Insufficient documentation

## 2021-06-13 LAB — BASIC METABOLIC PANEL
Anion gap: 9 (ref 5–15)
BUN: 22 mg/dL (ref 8–23)
CO2: 34 mmol/L — ABNORMAL HIGH (ref 22–32)
Calcium: 9.3 mg/dL (ref 8.9–10.3)
Chloride: 99 mmol/L (ref 98–111)
Creatinine, Ser: 1.18 mg/dL (ref 0.61–1.24)
GFR, Estimated: >60 mL/min (ref >60)
Glucose, Bld: 86 mg/dL (ref 70–99)
Potassium: 3.5 mmol/L (ref 3.5–5.1)
Sodium: 142 mmol/L (ref 135–145)

## 2021-06-13 LAB — DIGOXIN LEVEL: Digoxin Level: 0.9 ng/mL (ref 0.8–2.0)

## 2021-06-13 NOTE — Telephone Encounter (Signed)
Pt aware, agreeable, and verbalized understanding Script mailed to patient to have labs drawn at pcp office next week. Instructions to hold digoxin day of lab work given to patient, also written and mailed to him with script.

## 2021-06-13 NOTE — Patient Instructions (Signed)
Labs done today. We will contact you only if your labs are abnormal.  Your physician recommends that you keep your scheduled follow-up appointment.  If you have any questions or concerns before your next appointment please send [place] a message through [place] or call our office at [phone].    TO LEAVE A MESSAGE FOR THE NURSE SELECT OPTION 2, PLEASE LEAVE A MESSAGE INCLUDING: YOUR NAME DATE OF BIRTH CALL BACK NUMBER REASON FOR CALL**this is important as we prioritize the call backs  YOU WILL RECEIVE A CALL BACK THE SAME DAY AS LONG AS YOU CALL BEFORE 4:00 PM  At the [facility], you and your health needs are our priority. As part of our continuing mission to provide you with exceptional heart care, we have created designated Provider Care Teams. These Care Teams include your primary Cardiologist (physician) and Advanced Practice Providers (APPs- Physician Assistants and Nurse Practitioners) who all work together to provide you with the care you need, when you need it.   You may see any of the following providers on your designated Care Team at your next follow up: Dr Glori Bickers Dr Haynes Kerns, NP Lyda Jester, [place] [facility] [place], [place] Audry Riles, PharmD   Please be sure to bring in all your medications bottles to every appointment.

## 2021-06-13 NOTE — Telephone Encounter (Signed)
-----   Message from Rafael Bihari, [place] sent at 06/13/2021  2:24 PM EST ----- Dig level mildly elevated. Please repeat dig level next week as a trough (needs to hold dig before labs that day).

## 2021-06-22 ENCOUNTER — Other Ambulatory Visit: Payer: Self-pay | Admitting: Emergency Medicine

## 2021-06-26 ENCOUNTER — Telehealth (HOSPITAL_COMMUNITY): Payer: Self-pay | Admitting: Cardiology

## 2021-06-26 NOTE — Telephone Encounter (Signed)
Abnormal labs received Labs drawn 06/23/2021 Digoxin 1.2 Per Amy Clegg,NP Stop digoxin  Pt aware and voiced understanding

## 2021-07-08 ENCOUNTER — Other Ambulatory Visit (HOSPITAL_COMMUNITY): Payer: Self-pay | Admitting: Internal Medicine

## 2021-07-22 ENCOUNTER — Encounter: Payer: Self-pay | Admitting: Cardiology

## 2021-07-22 ENCOUNTER — Ambulatory Visit: Payer: Medicare Other | Admitting: Cardiology

## 2021-07-22 ENCOUNTER — Other Ambulatory Visit: Payer: Self-pay | Admitting: *Deleted

## 2021-07-22 VITALS — BP 128/72 | HR 83 | Ht 71.0 in | Wt 278.8 lb

## 2021-07-22 DIAGNOSIS — I5020 Unspecified systolic (congestive) heart failure: Secondary | ICD-10-CM

## 2021-07-22 DIAGNOSIS — I25119 Atherosclerotic heart disease of native coronary artery with unspecified angina pectoris: Secondary | ICD-10-CM | POA: Diagnosis not present

## 2021-07-22 DIAGNOSIS — I4821 Permanent atrial fibrillation: Secondary | ICD-10-CM

## 2021-07-22 MED ORDER — ENTRESTO 24-26 MG PO TABS: 1.0000 | ORAL_TABLET | Freq: Two times a day (BID) | ORAL | 3 refills | Status: DC

## 2021-07-22 MED ORDER — TORSEMIDE 20 MG PO TABS: 80.0000 mg | ORAL_TABLET | Freq: Two times a day (BID) | ORAL | 2 refills | Status: DC

## 2021-07-22 NOTE — Progress Notes (Signed)
Cardiology Office Note  Date: 07/22/2021   ID: Tommy Riley, DOB [birth year], MRN [id number]  PCP:  Clinton Quant, MD  Cardiologist:  Rozann Lesches, MD Electrophysiologist:  Vickie Epley, MD   Chief Complaint  Patient presents with   Cardiac follow-up    History of Present Illness: Tommy Riley is a 76 y.o. male last seen in December 2022.  He is here for a follow-up visit.  Reports NYHA class II-III dyspnea, his weight is up about 4 pounds.  He has been elevating his legs in bed at nighttime which has led to a more consistent diuresis.  We discussed adjustment of his Demadex on an as-needed basis as well.  Follow-up noted in the heart failure clinic in December 2022, I reviewed the note.  No medication changes were made at that time.  He is to have a follow-up echocardiogram in the next few months.  I do see that he had follow-up lab work including digoxin level of 1.0, this was discontinued.  He has a Chemical engineer CRT-D in place with follow-up by Dr. Lovena Le.  We went over the remainder of his medications which are outlined below.  Past Medical History:  Diagnosis Date   Anxiety    Atrial fibrillation and flutter ([place])    Atrial fibrillation ablation 2011 at [facility]   CAD in native artery    Chronic systolic heart failure (HCC)    CKD (chronic kidney disease), stage III (HCC)    COPD (chronic obstructive pulmonary disease) ([place])    COVID-22 February 2020   Depression    Essential hypertension    Gout    Hyperlipidemia    Ischemic heart disease    OSA (obstructive sleep apnea)    Stroke ([place]) 2003   Type 2 diabetes mellitus ([place])     Past Surgical History:  Procedure Laterality Date   ATRIAL FIBRILLATION ABLATION  2011   [facility]   AV NODE ABLATION N/A 05/07/2021   Procedure: AV NODE ABLATION;  Surgeon: Evans Lance, MD;  Location: [place] [facility];  Service: Cardiovascular;  Laterality: N/A;   CATARACT EXTRACTION Bilateral    CORONARY  ARTERY BYPASS GRAFT  2001   HERNIA REPAIR Right    ICD IMPLANT N/A 05/07/2021   Procedure: ICD IMPLANT;  Surgeon: Evans Lance, MD;  Location: [place] [facility];  Service: Cardiovascular;  Laterality: N/A;   RIGHT/LEFT HEART CATH AND CORONARY/GRAFT ANGIOGRAPHY N/A 05/05/2021   Procedure: RIGHT/LEFT HEART CATH AND CORONARY/GRAFT ANGIOGRAPHY;  Surgeon: Lorretta Harp, MD;  Location: [place] [facility];  Service: Cardiovascular;  Laterality: N/A;   TONSILLECTOMY AND ADENOIDECTOMY      Current Outpatient Medications  Medication Sig Dispense Refill   ACCU-CHEK AVIVA PLUS test strip      albuterol (PROVENTIL) (2.5 MG/3ML) 0.083% nebulizer solution Take 3 mLs (2.5 mg total) by nebulization every 6 (six) hours as needed for wheezing or shortness of breath. 75 mL 4   albuterol (VENTOLIN HFA) 108 (90 Base) MCG/ACT inhaler USE 2 INHALATIONS BY MOUTH  EVERY 4 HOURS AS NEEDED FOR SHORTNESS OF BREATH OR  WHEEZE 51 g 3   apixaban (ELIQUIS) 5 MG TABS tablet Take 1 tablet (5 mg total) by mouth 2 (two) times daily. 60 tablet 5   aspirin 81 MG tablet Take 81 mg by mouth daily.     Blood Glucose Monitoring Suppl (ONE TOUCH ULTRA 2) W/DEVICE KIT by Does not apply route.  Cholecalciferol (VITAMIN D3) 2000 UNITS TABS Take 1 capsule by mouth in the morning and at bedtime.     diphenhydramine-acetaminophen (TYLENOL PM) 25-500 MG TABS tablet Take 2 tablets by mouth at bedtime.     ezetimibe (ZETIA) 10 MG tablet Take 10 mg by mouth daily.     FARXIGA 10 MG TABS tablet Take 1 tablet (10 mg total) by mouth daily. 30 tablet 90   Fexofenadine HCl (MUCINEX ALLERGY PO) Take 1,200 mg of amoxicillin by mouth daily.     Fluticasone-Umeclidin-Vilant (TRELEGY ELLIPTA) 100-62.5-25 MCG/INH AEPB Inhale 1 puff into the lungs daily. Patient needs 90 day script with 3 refills. 491 each 3   folic acid (FOLVITE) 791 MCG tablet Take 400 mcg by mouth daily.     insulin detemir (LEVEMIR) 100 UNIT/ML injection Inject 50 Units into  the skin as needed. Up to 50 units     insulin lispro (HUMALOG) 100 UNIT/ML injection Inject 20 Units into the skin as needed for high blood sugar. Up to 20 units     loratadine (CLARITIN) 10 MG tablet Take 10 mg by mouth daily.     MELATONIN PO Take 5 mg by mouth at bedtime as needed (sleep).     metoprolol (TOPROL-XL) 200 MG 24 hr tablet Take 1 tablet (200 mg total) by mouth daily. Take with or immediately following a meal. 90 tablet 3   Multiple Vitamins-Minerals (CENTRUM SILVER PO) Take 1 tablet by mouth daily.      omega-3 acid ethyl esters (LOVAZA) 1 g capsule Take 2 capsules (2 g total) by mouth 2 (two) times daily.     oxymetazoline (AFRIN) 0.05 % nasal spray Place 1 spray into both nostrils 2 (two) times daily.     potassium chloride SA (KLOR-CON M) 20 MEQ tablet Take 2 tablets (40 mEq total) by mouth 2 (two) times daily. 360 tablet 3   rosuvastatin (CRESTOR) 40 MG tablet Take 1 tablet (40 mg total) by mouth daily. 90 tablet 3   SERTRALINE HCL PO Take 150 mg by mouth daily.     spironolactone (ALDACTONE) 25 MG tablet Take 1 tablet (25 mg total) by mouth daily. 90 tablet 3   torsemide (DEMADEX) 20 MG tablet Take 80 mg by mouth 2 (two) times daily.     vitamin C (ASCORBIC ACID) 500 MG tablet Take 500 mg by mouth daily.     Zinc Methionate 50 MG CAPS Take 1 capsule by mouth daily.     sacubitril-valsartan (ENTRESTO) 24-26 MG Take 1 tablet by mouth 2 (two) times daily. 180 tablet 3   No current facility-administered medications for this visit.   Allergies:  Codeine   ROS: No palpitations or syncope.  Physical Exam: VS:  BP 128/72    Pulse 83    Ht 5' 11"  (1.803 m)    Wt 278 lb 12.8 oz (126.5 kg)    SpO2 91%    BMI 38.88 kg/m , BMI Body mass index is 38.88 kg/m.  Wt Readings from Last 3 Encounters:  07/22/21 278 lb 12.8 oz (126.5 kg)  06/13/21 274 lb 12.8 oz (124.6 kg)  06/09/21 274 lb (124.3 kg)    General: Patient appears comfortable at rest. HEENT: Conjunctiva and lids  normal, wearing a mask. Neck: Supple, no elevated JVP or carotid bruits, no thyromegaly. Lungs: Clear to auscultation, nonlabored breathing at rest. Cardiac: Regular rate and rhythm, no S3, 1/6 systolic murmur, no pericardial rub. Extremities: Compression stockings in place with 1-2+ pitting edema.  ECG:  An ECG dated 05/14/2021 was personally reviewed today and demonstrated:  Ventricular paced rhythm with nonspecific ST changes.  Recent Labwork: 04/14/2021: TSH 0.541 05/01/2021: ALT 43; AST 41; B Natriuretic Peptide 498.7 05/09/2021: Hemoglobin 12.8; Magnesium 2.6; Platelets 146 06/13/2021: BUN 22; Creatinine, Ser 1.18; Potassium 3.5; Sodium 142   Other Studies Reviewed Today:  Echocardiogram 04/14/2021:  1. Left ventricular ejection fraction, by estimation, is 35 to 40%. The  left ventricle has moderately decreased function. The left ventricle  demonstrates global hypokinesis. There is mild left ventricular  hypertrophy. Left ventricular diastolic  parameters are indeterminate. In Afib with RVR during study, consider  repeat limited echo to better evaluate systolic function once rates better  controlled.   2. Right ventricular systolic function is mildly reduced. The right  ventricular size is normal. There is severely elevated pulmonary artery  systolic pressure. The estimated right ventricular systolic pressure is  01.6 mmHg.   3. Left atrial size was severely dilated.   4. Right atrial size was severely dilated.   5. The mitral valve is normal in structure. Trivial mitral valve  regurgitation.   6. The aortic valve is tricuspid. Aortic valve regurgitation is not  visualized. No aortic stenosis is present.   7. The inferior vena cava is dilated in size with <50% respiratory  variability, suggesting right atrial pressure of 15 mmHg.    Cardiac MRI 05/07/2021: IMPRESSION: 1. Mixed cardiomyopathy with evidence of ischemic and nonischemic etiologies   2. Subendocardial late  gadolinium enhancement consistent with prior infarcts at apex, basal to mid inferolateral, and mid anterolateral walls. LGE is greater than 50% transmural at the apex, suggesting nonviability. Remainder of myocardium appears viable.   3. Basal septal midwall LGE, which is a scar pattern seen in nonischemic cardiomyopathies and associated with a worse prognosis. Also with mid myocardial LGE in basal anterior wall   4. Elevated myocardial T2 values (up to 64m in basal inferior wall), suggesting myocardial edema   5. Normal LV size with moderate systolic dysfunction (EF 301%. Septal dyskinesis consistent with LBBB.   6.  Normal RV size and systolic function (EF 509%   7.  Dilated ascending aorta measuring 460m Assessment and Plan:  1.  HFrEF with chronic systolic heart failure, mixed cardiomyopathy with LVEF 35 to 40% by last assessment.  Plan to continue Demadex at 80 mg twice daily with potassium supplement, we did discuss home titration of therapy based on weight gain and leg swelling.  He also remains on Entresto, Toprol-XL, Aldactone, and FaIran Follow-up with echocardiogram and visit in the heart failure clinic as scheduled in March.  We will see him 2 months thereafter.  2.  Permanent atrial fibrillation, continue Eliquis for stroke prophylaxis.  CHA2DS2-VASc score is 7.  3.  Multivessel CAD status post CABG with patent bypass grafts and no active angina.  Continue Crestor.  4.  OSA on CPAP.  5.  [employer] CRT-D in place.  No device shocks or syncope.  Medication Adjustments/Labs and Tests Ordered: Current medicines are reviewed at length with the patient today.  Concerns regarding medicines are outlined above.   Tests Ordered: No orders of the defined types were placed in this encounter.   Medication Changes: Meds ordered this encounter  Medications   sacubitril-valsartan (ENTRESTO) 24-26 MG    Sig: Take 1 tablet by mouth 2 (two) times daily.    Dispense:   180 tablet    Refill:  3    Disposition:  Follow up  4 months.  Signed, Satira Sark, MD, [facility] 07/22/2021 1:45 PM    [facility] [place], [place], [place] [address] Phone: [phone]; Fax: [phone]

## 2021-07-22 NOTE — Patient Instructions (Addendum)
Medication Instructions:  Your physician has recommended you make the following change in your medication:  You may take an extra torsemide 20 mg daily as needed for 2-3 lbs weight gain in a day or 5 lbs weight gain in one week Continue all other medications the same  Labwork: none  Testing/Procedures: none  Follow-Up: Your physician recommends that you schedule a follow-up appointment in: 4 months  Any Other Special Instructions Will Be Listed Below (If Applicable).  If you need a refill on your cardiac medications before your next appointment, please call your pharmacy.

## 2021-07-23 ENCOUNTER — Other Ambulatory Visit: Payer: Self-pay | Admitting: Internal Medicine

## 2021-08-07 ENCOUNTER — Ambulatory Visit (INDEPENDENT_AMBULATORY_CARE_PROVIDER_SITE_OTHER): Payer: Medicare Other

## 2021-08-07 DIAGNOSIS — I4821 Permanent atrial fibrillation: Secondary | ICD-10-CM | POA: Diagnosis not present

## 2021-08-07 LAB — CUP PACEART REMOTE DEVICE CHECK
Battery Remaining Longevity: 132 mo
Battery Remaining Percentage: 100 %
Brady Statistic RA Percent Paced: 67 %
Brady Statistic RV Percent Paced: 0 %
Date Time Interrogation Session: 20230202010400
HighPow Impedance: 56 Ohm
Implantable Lead Implant Date: 20221103
Implantable Lead Implant Date: 20221103
Implantable Lead Implant Date: 20221103
Implantable Lead Location: 753858
Implantable Lead Location: 753860
Implantable Lead Location: 753860
Implantable Lead Model: 138
Implantable Lead Model: 3830
Implantable Lead Model: 4671
Implantable Lead Serial Number: 304581
Implantable Lead Serial Number: 852093
Implantable Pulse Generator Implant Date: 20221103
Lead Channel Impedance Value: 557 Ohm
Lead Channel Impedance Value: 581 Ohm
Lead Channel Impedance Value: 593 Ohm
Lead Channel Setting Pacing Amplitude: 3.5 V
Lead Channel Setting Sensing Sensitivity: 0.5 mV
Lead Channel Setting Sensing Sensitivity: 1 mV
Pulse Gen Serial Number: 390312

## 2021-08-12 ENCOUNTER — Other Ambulatory Visit: Payer: Self-pay

## 2021-08-12 MED ORDER — TORSEMIDE 20 MG PO TABS: 80.0000 mg | ORAL_TABLET | Freq: Two times a day (BID) | ORAL | 2 refills | Status: DC

## 2021-08-13 NOTE — Progress Notes (Signed)
Remote ICD transmission.   

## 2021-08-18 ENCOUNTER — Telehealth: Payer: Self-pay | Admitting: Emergency Medicine

## 2021-08-19 NOTE — Telephone Encounter (Signed)
I called [facility] and I am trying to find a sleep study and they do not have one one file. I do not see a sleep study on file for this patient.   I called the patient and left a message to call back to get more information about the CPAP and sleep study.

## 2021-08-21 ENCOUNTER — Ambulatory Visit: Payer: Medicare Other | Admitting: Internal Medicine

## 2021-08-21 ENCOUNTER — Encounter: Payer: Self-pay | Admitting: Internal Medicine

## 2021-08-21 ENCOUNTER — Other Ambulatory Visit: Payer: Self-pay

## 2021-08-21 VITALS — BP 112/72 | HR 79 | Ht 71.0 in | Wt 279.0 lb

## 2021-08-21 DIAGNOSIS — I4821 Permanent atrial fibrillation: Secondary | ICD-10-CM | POA: Diagnosis not present

## 2021-08-21 DIAGNOSIS — I5023 Acute on chronic systolic (congestive) heart failure: Secondary | ICD-10-CM | POA: Diagnosis not present

## 2021-08-21 DIAGNOSIS — Z95810 Presence of automatic (implantable) cardiac defibrillator: Secondary | ICD-10-CM

## 2021-08-21 NOTE — Progress Notes (Signed)
HPI Mr. Morini returns today for followup. He is a pleasant 76 yo man with chronic atrial fib and poorly controlled VR. He has chronic systolic heart failure and presented about 4 months ago with decompensated CHF, was hospitalized and underwent left heart cath which revealed 3 vessel CAD with prior CABG and patent grafts and an EF of 35%. He has felt poorly since undergoing his biv ICD insertion. We decided at the time of his procedure not to proceed with AV node ablation, instead hoping that uptitration of his AV nodal blocking drugs would control his VR. At the time of his procedure, the atrial lead was placed in the left bundle area, ICD place in the RV apical septum and LV lead placed on the lateral LV wall.  Allergies  Allergen Reactions   Codeine Anaphylaxis, Other (See Comments) and Shortness Of Breath    Tightness in chest Tightness in chest      Current Outpatient Medications  Medication Sig Dispense Refill   ACCU-CHEK AVIVA PLUS test strip      albuterol (PROVENTIL) (2.5 MG/3ML) 0.083% nebulizer solution Take 3 mLs (2.5 mg total) by nebulization every 6 (six) hours as needed for wheezing or shortness of breath. 75 mL 4   albuterol (VENTOLIN HFA) 108 (90 Base) MCG/ACT inhaler USE 2 INHALATIONS BY MOUTH  EVERY 4 HOURS AS NEEDED FOR SHORTNESS OF BREATH OR  WHEEZE 51 g 3   apixaban (ELIQUIS) 5 MG TABS tablet Take 1 tablet (5 mg total) by mouth 2 (two) times daily. 60 tablet 5   aspirin 81 MG tablet Take 81 mg by mouth daily.     Blood Glucose Monitoring Suppl (ONE TOUCH ULTRA 2) W/DEVICE KIT by Does not apply route.     Cholecalciferol (VITAMIN D3) 2000 UNITS TABS Take 1 capsule by mouth in the morning and at bedtime.     diphenhydramine-acetaminophen (TYLENOL PM) 25-500 MG TABS tablet Take 2 tablets by mouth at bedtime.     ezetimibe (ZETIA) 10 MG tablet Take 10 mg by mouth daily.     FARXIGA 10 MG TABS tablet Take 1 tablet (10 mg total) by mouth daily. 30 tablet 90    Fexofenadine HCl (MUCINEX ALLERGY PO) Take 1,200 mg of amoxicillin by mouth daily.     Fluticasone-Umeclidin-Vilant (TRELEGY ELLIPTA) 100-62.5-25 MCG/INH AEPB Inhale 1 puff into the lungs daily. Patient needs 90 day script with 3 refills. 578 each 3   folic acid (FOLVITE) 469 MCG tablet Take 400 mcg by mouth daily.     insulin detemir (LEVEMIR) 100 UNIT/ML injection Inject 50 Units into the skin as needed. Up to 50 units     insulin lispro (HUMALOG) 100 UNIT/ML injection Inject 20 Units into the skin as needed for high blood sugar. Up to 20 units     loratadine (CLARITIN) 10 MG tablet Take 10 mg by mouth daily.     MELATONIN PO Take 5 mg by mouth at bedtime as needed (sleep).     metoprolol (TOPROL-XL) 200 MG 24 hr tablet Take 1 tablet (200 mg total) by mouth daily. Take with or immediately following a meal. 90 tablet 3   Multiple Vitamins-Minerals (CENTRUM SILVER PO) Take 1 tablet by mouth daily.      omega-3 acid ethyl esters (LOVAZA) 1 g capsule Take 2 capsules (2 g total) by mouth 2 (two) times daily.     oxymetazoline (AFRIN) 0.05 % nasal spray Place 1 spray into both nostrils 2 (two) times daily.  potassium chloride SA (KLOR-CON M) 20 MEQ tablet Take 2 tablets (40 mEq total) by mouth 2 (two) times daily. 360 tablet 3   rosuvastatin (CRESTOR) 40 MG tablet Take 1 tablet (40 mg total) by mouth daily. 90 tablet 3   sacubitril-valsartan (ENTRESTO) 24-26 MG Take 1 tablet by mouth 2 (two) times daily. 180 tablet 3   SERTRALINE HCL PO Take 150 mg by mouth daily.     spironolactone (ALDACTONE) 25 MG tablet Take 1 tablet (25 mg total) by mouth daily. 90 tablet 3   torsemide (DEMADEX) 20 MG tablet Take 4 tablets (80 mg total) by mouth 2 (two) times daily. May take an additional 20 mg tablet daily as needed for 2-3 lbs weight gain in one day or 5 lbs in 1 week 990 tablet 2   vitamin C (ASCORBIC ACID) 500 MG tablet Take 500 mg by mouth daily.     Zinc Methionate 50 MG CAPS Take 1 capsule by mouth  daily.     No current facility-administered medications for this visit.     Past Medical History:  Diagnosis Date   Anxiety    Atrial fibrillation and flutter ([place])    Atrial fibrillation ablation 2011 at [facility]   CAD in native artery    Chronic systolic heart failure (HCC)    CKD (chronic kidney disease), stage III (HCC)    COPD (chronic obstructive pulmonary disease) ([place])    COVID-22 February 2020   Depression    Essential hypertension    Gout    Hyperlipidemia    Ischemic heart disease    OSA (obstructive sleep apnea)    Stroke ([place]) 2003   Type 2 diabetes mellitus ([place])     ROS:   All systems reviewed and negative except as noted in the HPI.   Past Surgical History:  Procedure Laterality Date   ATRIAL FIBRILLATION ABLATION  2011   [facility]   AV NODE ABLATION N/A 05/07/2021   Procedure: AV NODE ABLATION;  Surgeon: Evans Lance, MD;  Location: [place] [facility];  Service: Cardiovascular;  Laterality: N/A;   CATARACT EXTRACTION Bilateral    CORONARY ARTERY BYPASS GRAFT  2001   HERNIA REPAIR Right    ICD IMPLANT N/A 05/07/2021   Procedure: ICD IMPLANT;  Surgeon: Evans Lance, MD;  Location: [place] [facility];  Service: Cardiovascular;  Laterality: N/A;   RIGHT/LEFT HEART CATH AND CORONARY/GRAFT ANGIOGRAPHY N/A 05/05/2021   Procedure: RIGHT/LEFT HEART CATH AND CORONARY/GRAFT ANGIOGRAPHY;  Surgeon: Lorretta Harp, MD;  Location: [place] [facility];  Service: Cardiovascular;  Laterality: N/A;   TONSILLECTOMY AND ADENOIDECTOMY       Family History  Problem Relation Age of Onset   Raynaud syndrome Mother    Stroke Father    Diabetes Mellitus II Father    Hypertension Father    Hypertension Sister    Diabetes Mellitus II Sister    Stroke Brother    Crohn's disease Brother    Breast cancer Brother    COPD Brother      Social History   Socioeconomic History   Marital status: Married    Spouse name: Cruze Zingaro   Number of children: 3   Years of  education: 14   Highest education level: Some college, no degree  Occupational History    Comment: retired  Tobacco Use   Smoking status: Former    Packs/day: 2.50    Years: 50.00    Pack years: 125.00  Types: Cigarettes    Quit date: 06/17/2011    Years since quitting: 10.1   Smokeless tobacco: Never  Vaping Use   Vaping Use: Some days   Start date: 07/06/2017   Last attempt to quit: 04/19/2021   Substances: Flavoring  Substance and Sexual Activity   Alcohol use: No    Comment: 1 beer a month   Drug use: No   Sexual activity: Not on file  Other Topics Concern   Not on file  Social History Narrative   Consumes no caffeine   Social Determinants of Health   Financial Resource Strain: Low Risk    Difficulty of Paying Living Expenses: Not hard at all  Food Insecurity: No Food Insecurity   Worried About Charity fundraiser in the Last Year: Never true   Ran Out of Food in the Last Year: Never true  Transportation Needs: No Transportation Needs   Lack of Transportation (Medical): No   Lack of Transportation (Non-Medical): No  Physical Activity: Not on file  Stress: Not on file  Social Connections: Not on file  Intimate Partner Violence: Not on file     BP 112/72    Pulse 79    Ht 5' 11"  (1.803 m)    Wt 279 lb (126.6 kg)    SpO2 93%    BMI 38.91 kg/m   Physical Exam:  Well appearing NAD HEENT: Unremarkable Neck:  No JVD, no thyromegally Lymphatics:  No adenopathy Back:  No CVA tenderness Lungs:  Clear with no wheezes HEART:  IRegular rate rhythm, no murmurs, no rubs, no clicks Abd:  soft, positive bowel sounds, no organomegally, no rebound, no guarding Ext:  2 plus pulses, no edema, no cyanosis, no clubbing Skin:  No rashes no nodules Neuro:  CN II through XII intact, motor grossly intact  EKG - atrial fib with a RVR and LBBB  DEVICE  Normal device function.  See PaceArt for details.   Assess/Plan:  Uncontrolled atrial fib - I have recommended he undergo  AV node ablation which was not performed at the time of his biv implant. I have paced his left bundle area and his paced QRS is 114 ms, down from over 140 ms with his unpaced rhythm.  Chronic systolic heart failure - his symptoms are class 3B. Hopefully we can improve his symptoms with AV node ablation and left bundle area pacing Coags - he will continue his eliquis, holding a day prior to the ablation. CAD - he is s/p left heart cath with 3 vessel disease and patent grafts.  Obesity - this is playing a role. I suspect he has sleep apnea. He is encouraged to lose weight.  Carleene Overlie Anntionette Madkins,MD

## 2021-08-21 NOTE — Patient Instructions (Addendum)
Medication Instructions:  Your physician recommends that you continue on your current medications as directed. Please refer to the Current Medication list given to you today.  Labwork: You will get lab work today:  CBC and BMP  Testing/Procedures: None ordered.  Follow-Up:  SEE INSTRUCTION LETTER  Any Other Special Instructions Will Be Listed Below (If Applicable).  If you need a refill on your cardiac medications before your next appointment, please call your pharmacy.   Cardiac Ablation Cardiac ablation is a procedure to destroy, or ablate, a small amount of heart tissue in very specific places. The heart has many electrical connections. Sometimes these connections are abnormal and can cause the heart to beat very fast or irregularly. Ablating some of the areas that cause problems can improve the heart's rhythm or return it to normal. Ablation may be done for people who: Have Wolff-Parkinson-White syndrome. Have fast heart rhythms (tachycardia). Have taken medicines for an abnormal heart rhythm (arrhythmia) that were not effective or caused side effects. Have a high-risk heartbeat that may be life-threatening. During the procedure, a small incision is made in the neck or the groin, and a long, thin tube (catheter) is inserted into the incision and moved to the heart. Small devices (electrodes) on the tip of the catheter will send out electrical currents. A type of X-ray (fluoroscopy) will be used to help guide the catheter and to provide images of the heart. Tell a health care provider about: Any allergies you have. All medicines you are taking, including vitamins, herbs, eye drops, creams, and over-the-counter medicines. Any problems you or family members have had with anesthetic medicines. Any blood disorders you have. Any surgeries you have had. Any medical conditions you have, such as kidney failure. Whether you are pregnant or may be pregnant. What are the risks? Generally,  this is a safe procedure. However, problems may occur, including: Infection. Bruising and bleeding at the catheter insertion site. Bleeding into the chest, especially into the sac that surrounds the heart. This is a serious complication. Stroke or blood clots. Damage to nearby structures or organs. Allergic reaction to medicines or dyes. Need for a permanent pacemaker if the normal electrical system is damaged. A pacemaker is a small computer that sends electrical signals to the heart and helps your heart beat normally. The procedure not being fully effective. This may not be recognized until months later. Repeat ablation procedures are sometimes done. What happens before the procedure? Medicines Ask your health care provider about: Changing or stopping your regular medicines. This is especially important if you are taking diabetes medicines or blood thinners. Taking medicines such as aspirin and ibuprofen. These medicines can thin your blood. Do not take these medicines unless your health care provider tells you to take them. Taking over-the-counter medicines, vitamins, herbs, and supplements. General instructions Follow instructions from your health care provider about eating or drinking restrictions. Plan to have someone take you home from the hospital or clinic. If you will be going home right after the procedure, plan to have someone with you for 24 hours. Ask your health care provider what steps will be taken to prevent infection. What happens during the procedure?  An IV will be inserted into one of your veins. You will be given a medicine to help you relax (sedative). The skin on your neck or groin will be numbed. An incision will be made in your neck or your groin. A needle will be inserted through the incision and into a large vein  in your neck or groin. A catheter will be inserted into the needle and moved to your heart. Dye may be injected through the catheter to help your  surgeon see the area of the heart that needs treatment. Electrical currents will be sent from the catheter to ablate heart tissue in desired areas. There are three types of energy that may be used to do this: Heat (radiofrequency energy). Laser energy. Extreme cold (cryoablation). When the tissue has been ablated, the catheter will be removed. Pressure will be held on the insertion area to prevent a lot of bleeding. A bandage (dressing) will be placed over the insertion area. The exact procedure may vary among health care providers and hospitals. What happens after the procedure? Your blood pressure, heart rate, breathing rate, and blood oxygen level will be monitored until you leave the hospital or clinic. Your insertion area will be monitored for bleeding. You will need to lie still for a few hours to ensure that you do not bleed from the insertion area. Do not drive for 24 hours or as long as told by your health care provider. Summary Cardiac ablation is a procedure to destroy, or ablate, a small amount of heart tissue using an electrical current. This procedure can improve the heart rhythm or return it to normal. Tell your health care provider about any medical conditions you may have and all medicines you are taking to treat them. This is a safe procedure, but problems may occur. Problems may include infection, bruising, damage to nearby organs or structures, or allergic reactions to medicines. Follow your health care provider's instructions about eating and drinking before the procedure. You may also be told to change or stop some of your medicines. After the procedure, do not drive for 24 hours or as long as told by your health care provider. This information is not intended to replace advice given to you by your health care provider. Make sure you discuss any questions you have with your health care provider. Document Revised: 05/01/2019 Document Reviewed: 05/01/2019 Elsevier Patient  Education  [place].

## 2021-08-22 LAB — CBC WITH DIFFERENTIAL/PLATELET
Basophils Absolute: 0 10*3/uL (ref 0.0–0.2)
Basos: 1 %
EOS (ABSOLUTE): 0.1 10*3/uL (ref 0.0–0.4)
Eos: 2 %
Hematocrit: 41.5 % (ref 37.5–51.0)
Hemoglobin: 13.5 g/dL (ref 13.0–17.7)
Immature Grans (Abs): 0 10*3/uL (ref 0.0–0.1)
Immature Granulocytes: 0 %
Lymphocytes Absolute: 0.7 10*3/uL (ref 0.7–3.1)
Lymphs: 14 %
MCH: 27.4 pg (ref 26.6–33.0)
MCHC: 32.5 g/dL (ref 31.5–35.7)
MCV: 84 fL (ref 79–97)
Monocytes Absolute: 0.5 10*3/uL (ref 0.1–0.9)
Monocytes: 9 %
Neutrophils Absolute: 3.9 10*3/uL (ref 1.4–7.0)
Neutrophils: 74 %
Platelets: 149 10*3/uL — ABNORMAL LOW (ref 150–450)
RBC: 4.92 x10E6/uL (ref 4.14–5.80)
RDW: 15.2 % (ref 11.6–15.4)
WBC: 5.2 10*3/uL (ref 3.4–10.8)

## 2021-08-22 LAB — BASIC METABOLIC PANEL
BUN/Creatinine Ratio: 20 (ref 10–24)
BUN: 21 mg/dL (ref 8–27)
CO2: 32 mmol/L — ABNORMAL HIGH (ref 20–29)
Calcium: 9.2 mg/dL (ref 8.6–10.2)
Chloride: 101 mmol/L (ref 96–106)
Creatinine, Ser: 1.06 mg/dL (ref 0.76–1.27)
Glucose: 171 mg/dL — ABNORMAL HIGH (ref 70–99)
Potassium: 3.5 mmol/L (ref 3.5–5.2)
Sodium: 149 mmol/L — ABNORMAL HIGH (ref 134–144)
eGFR: 74 mL/min/{1.73_m2} (ref >59)

## 2021-08-25 ENCOUNTER — Telehealth: Payer: Self-pay | Admitting: Internal Medicine

## 2021-08-25 NOTE — Telephone Encounter (Signed)
Wife calling in to see if procedure can be move up from 2/27. Patient is having a hard time with his breathing. Please advise

## 2021-08-25 NOTE — Telephone Encounter (Signed)
Returned call to Pt's wife.  Per wife Pt was interested in  moving up his appointment.  Wife states he has trouble breathing.  Wife also states that Pt drinks a lot of fluids, which she has tried to get him to stop.  Per wife-Pt does weigh himself daily, and does not usually go over 1 pound difference from day to day.  He is scheduled to see his pulmonologist tomorrow.  Advised to decrease fluids and continue to monitor daily weights for need for extra torsemide.  Pt is scheduled at next available time for procedure.

## 2021-08-26 ENCOUNTER — Ambulatory Visit: Payer: Medicare Other | Admitting: Primary Care

## 2021-08-26 ENCOUNTER — Encounter: Payer: Self-pay | Admitting: Primary Care

## 2021-08-26 ENCOUNTER — Telehealth: Payer: Self-pay | Admitting: Internal Medicine

## 2021-08-26 ENCOUNTER — Other Ambulatory Visit: Payer: Self-pay

## 2021-08-26 VITALS — BP 122/86 | HR 130 | Ht 72.0 in | Wt 281.0 lb

## 2021-08-26 DIAGNOSIS — I4821 Permanent atrial fibrillation: Secondary | ICD-10-CM | POA: Diagnosis not present

## 2021-08-26 DIAGNOSIS — I5023 Acute on chronic systolic (congestive) heart failure: Secondary | ICD-10-CM | POA: Diagnosis not present

## 2021-08-26 DIAGNOSIS — G4733 Obstructive sleep apnea (adult) (pediatric): Secondary | ICD-10-CM

## 2021-08-26 DIAGNOSIS — R0602 Shortness of breath: Secondary | ICD-10-CM | POA: Diagnosis not present

## 2021-08-26 DIAGNOSIS — Z9989 Dependence on other enabling machines and devices: Secondary | ICD-10-CM

## 2021-08-26 DIAGNOSIS — J438 Other emphysema: Secondary | ICD-10-CM | POA: Diagnosis not present

## 2021-08-26 LAB — BASIC METABOLIC PANEL
BUN: 23 mg/dL (ref 6–23)
CO2: 37 mEq/L — ABNORMAL HIGH (ref 19–32)
Calcium: 9.6 mg/dL (ref 8.4–10.5)
Chloride: 101 mEq/L (ref 96–112)
Creatinine, Ser: 1.2 mg/dL (ref 0.40–1.50)
GFR: 60.1 mL/min (ref >60.00)
Glucose, Bld: 82 mg/dL (ref 70–99)
Potassium: 3.6 mEq/L (ref 3.5–5.1)
Sodium: 144 mEq/L (ref 135–145)

## 2021-08-26 LAB — BRAIN NATRIURETIC PEPTIDE: Pro B Natriuretic peptide (BNP): 353 pg/mL — ABNORMAL HIGH (ref 0.0–100.0)

## 2021-08-26 MED ORDER — ALBUTEROL SULFATE (2.5 MG/3ML) 0.083% IN NEBU: 2.5000 mg | INHALATION_SOLUTION | Freq: Four times a day (QID) | RESPIRATORY_TRACT | 4 refills | Status: DC | PRN

## 2021-08-26 NOTE — Patient Instructions (Signed)
Please call cardiologist d/t afib HR 130 and increased weight gain [phone]  No med changes at this time, checking labs. May adjust diuretics and HR medication   Orders: Labs today (BMET, BNP)

## 2021-08-26 NOTE — Telephone Encounter (Signed)
STAT if HR is under 50 or over 120 (normal HR is 60-100 beats per minute)  What is your heart rate? 130  Do you have a log of your heart rate readings (document readings)?   Do you have any other symptoms? Short of breath

## 2021-08-26 NOTE — Assessment & Plan Note (Addendum)
-   Uncontrolled afib, HR 120 - Scheduled for AV ablation with cardiology next week - Continue Troprol XL 200mg   - Advised patient to reach out to cardiology, I will also send Dr. Lovena Le a message

## 2021-08-26 NOTE — Assessment & Plan Note (Signed)
-   COPD does not appear acutely exacerbated today. No wheezing on exam. Shortness of breath d/t afib, heart failure. Continue Trelegy 137mcg and prn albuterol. No indication for prednisone.

## 2021-08-26 NOTE — Assessment & Plan Note (Addendum)
-   Patient is 100% compliant with CPAP use. Pressure 17cm h20, residual AHI 2.0. [facility] needs updated sleep study as last one was >15-20 years ago and not available. Order split night sleep study d/t hx OSA and chronic respiratory failure.

## 2021-08-26 NOTE — Telephone Encounter (Signed)
Call transferred to triage nurse. Spoke w patient and his wife.  His HR is 120-130s.  He is fatigued and SOB and has gained "a few pounds and feels like crap".    They are in town from [place] and just left a pulmonary appointment.  He is scheduled for ablation on 09/01/21.  They want to know if he should be seen by Dr. Lovena Le or go to the ER or wait.  I spoke w Dr. Lovena Le and then adv the patient that he can go into the ER and one of his partners may see him if he feels that is the right thing.  The patient states as long as he isn't moving around he feels alright.  We discussed that his HR is fast and sustaining at that rate may worsen his symptoms.  Adv seeing him in the office today is not going to be helpful.   They are getting lunch at the drive through while on the phone and will then decide what he should do.

## 2021-08-26 NOTE — Progress Notes (Signed)
@Patient  ID: Tommy Riley, male    DOB: December 18, 1945, 76 y.o.   MRN: [id number]  Chief Complaint  Patient presents with   Shortness of Breath    Pt is waiting on cardiac abrasion 3lpm     Referring provider: Pomposini, [name], MD  HPI: 76 year old male, smoker quit in 2012 (125-pack-year history).  Past medical history significant for COPD, chronic respiratory failure with hypoxia, OSA on CPAP, pneumonia due to COVID-19, hypertension,pulmonary HTN, CABG, permanent A-fib status post ICD, chronic systolic heart failure, type 2 diabetes. Patient of Dr. Lamonte Sakai.   08/26/2021 Patient presents today for acute office visit. He reports increased shortness of breath symptoms over the last week. He saw cardiology on 08/21/21 for uncontrolled afib , recommended patient undergo AV node. He is taking 41m torsemide twice daily along with 28m, Entresto and Toprol 20053maily. He is compliant with his medication. His weight is increased 5 lbs over the last week. He has some swelling in his knees above his compression stockings and in his hands. He is following a 64oz fluid restriction. He is on Trelegy for COPD. Needs refill of nebulizer medication. He has a chronic cough with clear mucus production. NO chest tightness of wheezing. He is on 3-4L supplemental oxygen without new requirements. He is complaint with CPAP 17cm h20. He needs new cpap supplies, [facility] is needing him to have repeat sleep study as original is >15-20 years and not available.    Allergies  Allergen Reactions   Codeine Anaphylaxis, Other (See Comments) and Shortness Of Breath    Tightness in chest Tightness in chest     Immunization History  Administered Date(s) Administered   Influenza, High Dose Seasonal PF 08/16/2015, 04/30/2016, 04/21/2017, 05/10/2017, 04/25/2018, 05/03/2018   Influenza,inj,Quad PF,6+ Mos 05/08/2015   Influenza,inj,Quad PF,6-35 Mos 05/07/2019   Influenza,inj,quad, With Preservative 04/30/2016,  04/22/2017, 04/07/2019, 04/05/2020   Influenza-Unspecified 04/14/2014, 04/21/2017, 05/29/2018   Moderna Sars-Covid-2 Vaccination 07/29/2019, 08/26/2019, 03/25/2020   Pneumococcal Conjugate-13 06/22/2013, 04/14/2014, 08/16/2015   Pneumococcal Polysaccharide-23 06/13/2013, 03/25/2015   Tdap 12/17/2016, 03/15/2017   Zoster Recombinat (Shingrix) 03/15/2017, 03/29/2017   Zoster, Live 03/04/2011    Past Medical History:  Diagnosis Date   Anxiety    Atrial fibrillation and flutter (HCC)    [facility]    Chronic systolic heart failure (HCC)    CKD (chronic kidney disease), stage III (HCC)    [place]  COVID[place] 2020   Depression    Essential hypertension    Gout    Hyperlipidemia    Ischemic heart disease    OSA (obstructive sleep apnea)    Stroke ([facility]   Type 2 diabetes mellitus (HCC)     Tobacco History: Social History   Tobacco Use  Smoking Status Former   Packs/day: 2.50   Years: 50.00   Pack years: 125.00   Types: Cigarettes   Quit date: 06/17/2011   Years since quitting: 10.2  Smokeless Tobacco Never   Counseling given: Not Answered   Outpatient Medications Prior to Visit  Medication Sig Dispense Refill   ACCU-CHEK AVIVA PLUS test strip      albuterol (PROVENTIL) (2.5 MG/3ML) 0.083% nebulizer solution Take 3 mLs (2.5 mg total) by nebulization every 6 (six) hours as needed for wheezing or shortness of breath. 75 mL 4   albuterol (VENTOLIN HFA) 108 (90 Base) MCG/ACT inhaler USE 2 INHALATIONS BY  MOUTH  EVERY 4 HOURS AS NEEDED FOR SHORTNESS OF BREATH OR  WHEEZE 51 g 3   apixaban (ELIQUIS) 5 MG TABS tablet Take 1 tablet (5 mg total) by mouth 2 (two) times daily. 60 tablet 5   aspirin 81 MG tablet Take 81 mg by mouth daily.     Blood Glucose Monitoring Suppl (ONE TOUCH ULTRA 2) W/DEVICE KIT by Does not apply route.     Cholecalciferol (VITAMIN D3) 2000 UNITS  TABS Take 1 capsule by mouth in the morning and at bedtime.     diphenhydramine-acetaminophen (TYLENOL PM) 25-500 MG TABS tablet Take 2 tablets by mouth at bedtime.     ezetimibe (ZETIA) 10 MG tablet Take 10 mg by mouth daily.     FARXIGA 10 MG TABS tablet Take 1 tablet (10 mg total) by mouth daily. 30 tablet 90   Fexofenadine HCl (MUCINEX ALLERGY PO) Take 1,200 mg of amoxicillin by mouth daily.     Fluticasone-Umeclidin-Vilant (TRELEGY ELLIPTA) 100-62.5-25 MCG/INH AEPB Inhale 1 puff into the lungs daily. Patient needs 90 day script with 3 refills. 102 each 3   folic acid (FOLVITE) 725 MCG tablet Take 400 mcg by mouth daily.     insulin detemir (LEVEMIR) 100 UNIT/ML injection Inject 50 Units into the skin as needed. Up to 50 units     insulin lispro (HUMALOG) 100 UNIT/ML injection Inject 20 Units into the skin as needed for high blood sugar. Up to 20 units     loratadine (CLARITIN) 10 MG tablet Take 10 mg by mouth daily.     MELATONIN PO Take 5 mg by mouth at bedtime as needed (sleep).     metoprolol (TOPROL-XL) 200 MG 24 hr tablet Take 1 tablet (200 mg total) by mouth daily. Take with or immediately following a meal. 90 tablet 3   Multiple Vitamins-Minerals (CENTRUM SILVER PO) Take 1 tablet by mouth daily.      omega-3 acid ethyl esters (LOVAZA) 1 g capsule Take 2 capsules (2 g total) by mouth 2 (two) times daily.     oxymetazoline (AFRIN) 0.05 % nasal spray Place 1 spray into both nostrils 2 (two) times daily.     potassium chloride SA (KLOR-CON M) 20 MEQ tablet Take 2 tablets (40 mEq total) by mouth 2 (two) times daily. 360 tablet 3   rosuvastatin (CRESTOR) 40 MG tablet Take 1 tablet (40 mg total) by mouth daily. 90 tablet 3   sacubitril-valsartan (ENTRESTO) 24-26 MG Take 1 tablet by mouth 2 (two) times daily. 180 tablet 3   SERTRALINE HCL PO Take 150 mg by mouth daily.     spironolactone (ALDACTONE) 25 MG tablet Take 1 tablet (25 mg total) by mouth daily. 90 tablet 3   torsemide (DEMADEX) 20  MG tablet Take 4 tablets (80 mg total) by mouth 2 (two) times daily. May take an additional 20 mg tablet daily as needed for 2-3 lbs weight gain in one day or 5 lbs in 1 week 990 tablet 2   vitamin C (ASCORBIC ACID) 500 MG tablet Take 500 mg by mouth daily.     Zinc Methionate 50 MG CAPS Take 1 capsule by mouth daily.     No facility-administered medications prior to visit.   Review of Systems  Review of Systems  Constitutional: Negative.   HENT: Negative.    Respiratory:  Positive for shortness of breath. Negative for cough, chest tightness and wheezing.   Cardiovascular:  Positive for leg swelling. Negative for chest pain.  Physical Exam  BP 122/86    Pulse (!) 130    Ht 6' (1.829 m)    Wt 281 lb (127.5 kg)    SpO2 95% Comment: 3lpm   BMI 38.11 kg/m  Physical Exam Constitutional:      General: He is not in acute distress.    Appearance: He is well-developed. He is not ill-appearing.  HENT:     Head: Normocephalic and atraumatic.  Cardiovascular:     Rate and Rhythm: Tachycardia present. Rhythm irregular.     Comments: HR 120-130; +2BLE edema  Pulmonary:     Effort: Pulmonary effort is normal.     Breath sounds: Examination of the right-lower field reveals rales. Examination of the left-lower field reveals rales. Rales present. No wheezing or rhonchi.  Musculoskeletal:        General: Normal range of motion.  Skin:    General: Skin is warm and dry.  Neurological:     General: No focal deficit present.     Mental Status: He is oriented to person, place, and time.  Psychiatric:        Mood and Affect: Mood normal.        Behavior: Behavior normal.     Lab Results:  CBC    Component Value Date/Time   WBC 5.2 08/21/2021 1549   WBC 4.8 05/09/2021 0459   RBC 4.92 08/21/2021 1549   RBC 4.30 05/09/2021 0459   HGB 13.5 08/21/2021 1549   HCT 41.5 08/21/2021 1549   PLT 149 (L) 08/21/2021 1549   MCV 84 08/21/2021 1549   MCH 27.4 08/21/2021 1549   MCH 29.8 05/09/2021  0459   MCHC 32.5 08/21/2021 1549   MCHC 31.8 05/09/2021 0459   RDW 15.2 08/21/2021 1549   LYMPHSABS 0.7 08/21/2021 1549   MONOABS 0.6 04/13/2021 1724   EOSABS 0.1 08/21/2021 1549   BASOSABS 0.0 08/21/2021 1549    BMET    Component Value Date/Time   NA 149 (H) 08/21/2021 1549   K 3.5 08/21/2021 1549   CL 101 08/21/2021 1549   CO2 32 (H) 08/21/2021 1549   GLUCOSE 171 (H) 08/21/2021 1549   GLUCOSE 86 06/13/2021 1300   BUN 21 08/21/2021 1549   CREATININE 1.06 08/21/2021 1549   CALCIUM 9.2 08/21/2021 1549   GFRNONAA >60 06/13/2021 1300    BNP    Component Value Date/Time   BNP 498.7 (H) 05/01/2021 0304    ProBNP    Component Value Date/Time   PROBNP 72.0 12/14/2014 1321    Imaging: CUP PACEART REMOTE DEVICE CHECK  Result Date: 08/07/2021 Scheduled remote reviewed. Normal device function.  HL=29 Next remote 91 days. [place]    Assessment & Plan:   COPD (chronic obstructive pulmonary disease) ([place]) - COPD does not appear acutely exacerbated today. No wheezing on exam. Shortness of breath d/t afib, heart failure. Continue Trelegy 122mg and prn albuterol. No indication for prednisone.   Acute on chronic systolic heart failure (HCC) - Increased sob last week. Currently in AFIB. Weight +5lbs with associated LE swelling - Checking BNP and BMET - Continue Torsemide 818mBID, potassium supplement, spironolactone and Entresto  Permanent atrial fibrillation (HCC) - Uncontrolled afib, HR 120 - Scheduled for AV ablation with cardiology next week - Continue Troprol XL 20060m- Advised patient to reach out to cardiology, I will also send Dr. TayLovena Lemessage   OSA on CPAP - Patient is 100% compliant with CPAP use. Pressure 17cm h20, residual AHI 2.0. [facility]  needs updated sleep study as last one was >15-20 years ago and not available. Order split night sleep study d/t hx OSA and chronic respiratory failure.   40 mins spent on case: >50% face to face with patient   Martyn Ehrich, NP 08/26/2021

## 2021-08-26 NOTE — Progress Notes (Signed)
Please let patient know BNP was elevated, per cardiology note they were recommending ED visit as they could not get him in today

## 2021-08-26 NOTE — Assessment & Plan Note (Addendum)
-   Increased sob last week. Currently in AFIB. Weight +5lbs with associated LE swelling - Checking BNP and BMET - Continue Torsemide 80mg  BID, potassium supplement, spironolactone and Entresto

## 2021-08-28 ENCOUNTER — Other Ambulatory Visit: Payer: Self-pay

## 2021-08-28 ENCOUNTER — Emergency Department (HOSPITAL_COMMUNITY): Payer: Medicare Other

## 2021-08-28 ENCOUNTER — Inpatient Hospital Stay (HOSPITAL_COMMUNITY)
Admission: EM | Admit: 2021-08-28 | Discharge: 2021-09-02 | DRG: 273 | Disposition: A | Discharge status: Home / Self Care | Payer: Medicare Other | Attending: Internal Medicine | Admitting: Internal Medicine

## 2021-08-28 ENCOUNTER — Encounter (HOSPITAL_COMMUNITY): Payer: Self-pay

## 2021-08-28 DIAGNOSIS — I272 Pulmonary hypertension, unspecified: Secondary | ICD-10-CM | POA: Diagnosis present

## 2021-08-28 DIAGNOSIS — G4733 Obstructive sleep apnea (adult) (pediatric): Secondary | ICD-10-CM | POA: Diagnosis present

## 2021-08-28 DIAGNOSIS — Z7982 Long term (current) use of aspirin: Secondary | ICD-10-CM

## 2021-08-28 DIAGNOSIS — F32A Depression, unspecified: Secondary | ICD-10-CM | POA: Diagnosis present

## 2021-08-28 DIAGNOSIS — E669 Obesity, unspecified: Secondary | ICD-10-CM | POA: Diagnosis present

## 2021-08-28 DIAGNOSIS — I5023 Acute on chronic systolic (congestive) heart failure: Secondary | ICD-10-CM | POA: Diagnosis present

## 2021-08-28 DIAGNOSIS — E785 Hyperlipidemia, unspecified: Secondary | ICD-10-CM | POA: Diagnosis present

## 2021-08-28 DIAGNOSIS — E876 Hypokalemia: Secondary | ICD-10-CM | POA: Diagnosis present

## 2021-08-28 DIAGNOSIS — I4821 Permanent atrial fibrillation: Secondary | ICD-10-CM | POA: Diagnosis present

## 2021-08-28 DIAGNOSIS — I4819 Other persistent atrial fibrillation: Secondary | ICD-10-CM

## 2021-08-28 DIAGNOSIS — Z951 Presence of aortocoronary bypass graft: Secondary | ICD-10-CM

## 2021-08-28 DIAGNOSIS — J449 Chronic obstructive pulmonary disease, unspecified: Secondary | ICD-10-CM | POA: Diagnosis present

## 2021-08-28 DIAGNOSIS — I4892 Unspecified atrial flutter: Secondary | ICD-10-CM | POA: Diagnosis present

## 2021-08-28 DIAGNOSIS — E119 Type 2 diabetes mellitus without complications: Secondary | ICD-10-CM | POA: Diagnosis not present

## 2021-08-28 DIAGNOSIS — M109 Gout, unspecified: Secondary | ICD-10-CM | POA: Diagnosis present

## 2021-08-28 DIAGNOSIS — Z885 Allergy status to narcotic agent status: Secondary | ICD-10-CM | POA: Diagnosis not present

## 2021-08-28 DIAGNOSIS — Z79899 Other long term (current) drug therapy: Secondary | ICD-10-CM

## 2021-08-28 DIAGNOSIS — Z95810 Presence of automatic (implantable) cardiac defibrillator: Secondary | ICD-10-CM

## 2021-08-28 DIAGNOSIS — R0602 Shortness of breath: Secondary | ICD-10-CM

## 2021-08-28 DIAGNOSIS — J9611 Chronic respiratory failure with hypoxia: Secondary | ICD-10-CM | POA: Diagnosis present

## 2021-08-28 DIAGNOSIS — E1169 Type 2 diabetes mellitus with other specified complication: Secondary | ICD-10-CM | POA: Diagnosis present

## 2021-08-28 DIAGNOSIS — I130 Hypertensive heart and chronic kidney disease with heart failure and stage 1 through stage 4 chronic kidney disease, or unspecified chronic kidney disease: Secondary | ICD-10-CM | POA: Diagnosis present

## 2021-08-28 DIAGNOSIS — I482 Chronic atrial fibrillation, unspecified: Secondary | ICD-10-CM

## 2021-08-28 DIAGNOSIS — N182 Chronic kidney disease, stage 2 (mild): Secondary | ICD-10-CM | POA: Diagnosis not present

## 2021-08-28 DIAGNOSIS — I2510 Atherosclerotic heart disease of native coronary artery without angina pectoris: Secondary | ICD-10-CM | POA: Diagnosis present

## 2021-08-28 DIAGNOSIS — Z9981 Dependence on supplemental oxygen: Secondary | ICD-10-CM

## 2021-08-28 DIAGNOSIS — Z9989 Dependence on other enabling machines and devices: Secondary | ICD-10-CM | POA: Diagnosis not present

## 2021-08-28 DIAGNOSIS — Z20822 Contact with and (suspected) exposure to covid-19: Secondary | ICD-10-CM | POA: Diagnosis present

## 2021-08-28 DIAGNOSIS — I5043 Acute on chronic combined systolic (congestive) and diastolic (congestive) heart failure: Secondary | ICD-10-CM

## 2021-08-28 DIAGNOSIS — E1122 Type 2 diabetes mellitus with diabetic chronic kidney disease: Secondary | ICD-10-CM | POA: Diagnosis present

## 2021-08-28 DIAGNOSIS — Z7984 Long term (current) use of oral hypoglycemic drugs: Secondary | ICD-10-CM

## 2021-08-28 DIAGNOSIS — R778 Other specified abnormalities of plasma proteins: Secondary | ICD-10-CM | POA: Diagnosis not present

## 2021-08-28 DIAGNOSIS — J90 Pleural effusion, not elsewhere classified: Secondary | ICD-10-CM

## 2021-08-28 DIAGNOSIS — J438 Other emphysema: Secondary | ICD-10-CM | POA: Diagnosis not present

## 2021-08-28 DIAGNOSIS — Z794 Long term (current) use of insulin: Secondary | ICD-10-CM

## 2021-08-28 DIAGNOSIS — Z7951 Long term (current) use of inhaled steroids: Secondary | ICD-10-CM

## 2021-08-28 DIAGNOSIS — Z6838 Body mass index (BMI) 38.0-38.9, adult: Secondary | ICD-10-CM

## 2021-08-28 DIAGNOSIS — Z8673 Personal history of transient ischemic attack (TIA), and cerebral infarction without residual deficits: Secondary | ICD-10-CM | POA: Diagnosis not present

## 2021-08-28 DIAGNOSIS — Z833 Family history of diabetes mellitus: Secondary | ICD-10-CM

## 2021-08-28 DIAGNOSIS — N183 Chronic kidney disease, stage 3 unspecified: Secondary | ICD-10-CM | POA: Diagnosis present

## 2021-08-28 DIAGNOSIS — I10 Essential (primary) hypertension: Secondary | ICD-10-CM

## 2021-08-28 DIAGNOSIS — I509 Heart failure, unspecified: Secondary | ICD-10-CM

## 2021-08-28 DIAGNOSIS — I447 Left bundle-branch block, unspecified: Secondary | ICD-10-CM | POA: Diagnosis present

## 2021-08-28 DIAGNOSIS — I5041 Acute combined systolic (congestive) and diastolic (congestive) heart failure: Secondary | ICD-10-CM | POA: Diagnosis not present

## 2021-08-28 DIAGNOSIS — Z87891 Personal history of nicotine dependence: Secondary | ICD-10-CM

## 2021-08-28 DIAGNOSIS — Z8249 Family history of ischemic heart disease and other diseases of the circulatory system: Secondary | ICD-10-CM

## 2021-08-28 DIAGNOSIS — Z823 Family history of stroke: Secondary | ICD-10-CM

## 2021-08-28 DIAGNOSIS — Z7901 Long term (current) use of anticoagulants: Secondary | ICD-10-CM

## 2021-08-28 LAB — CBC WITH DIFFERENTIAL/PLATELET
Abs Immature Granulocytes: 0.02 10*3/uL (ref 0.00–0.07)
Basophils Absolute: 0 10*3/uL (ref 0.0–0.1)
Basophils Relative: 1 %
Eosinophils Absolute: 0.1 10*3/uL (ref 0.0–0.5)
Eosinophils Relative: 2 %
HCT: 43.2 % (ref 39.0–52.0)
Hemoglobin: 13 g/dL (ref 13.0–17.0)
Immature Granulocytes: 0 %
Lymphocytes Relative: 11 %
Lymphs Abs: 0.6 10*3/uL — ABNORMAL LOW (ref 0.7–4.0)
MCH: 27.4 pg (ref 26.0–34.0)
MCHC: 30.1 g/dL (ref 30.0–36.0)
MCV: 91.1 fL (ref 80.0–100.0)
Monocytes Absolute: 0.5 10*3/uL (ref 0.1–1.0)
Monocytes Relative: 8 %
Neutro Abs: 4.2 10*3/uL (ref 1.7–7.7)
Neutrophils Relative %: 78 %
Platelets: 192 10*3/uL (ref 150–400)
RBC: 4.74 MIL/uL (ref 4.22–5.81)
RDW: 17 % — ABNORMAL HIGH (ref 11.5–15.5)
WBC: 5.4 10*3/uL (ref 4.0–10.5)
nRBC: 0 % (ref 0.0–0.2)

## 2021-08-28 LAB — BASIC METABOLIC PANEL
Anion gap: 10 (ref 5–15)
BUN: 23 mg/dL (ref 8–23)
CO2: 33 mmol/L — ABNORMAL HIGH (ref 22–32)
Calcium: 9.1 mg/dL (ref 8.9–10.3)
Chloride: 100 mmol/L (ref 98–111)
Creatinine, Ser: 1.25 mg/dL — ABNORMAL HIGH (ref 0.61–1.24)
GFR, Estimated: >60 mL/min (ref >60)
Glucose, Bld: 180 mg/dL — ABNORMAL HIGH (ref 70–99)
Potassium: 3.6 mmol/L (ref 3.5–5.1)
Sodium: 143 mmol/L (ref 135–145)

## 2021-08-28 LAB — TROPONIN I (HIGH SENSITIVITY)
Troponin I (High Sensitivity): 54 ng/L — ABNORMAL HIGH (ref <18)
Troponin I (High Sensitivity): 57 ng/L — ABNORMAL HIGH (ref <18)

## 2021-08-28 LAB — CBG MONITORING, ED: Glucose-Capillary: 161 mg/dL — ABNORMAL HIGH (ref 70–99)

## 2021-08-28 LAB — RESP PANEL BY RT-PCR (FLU A&B, COVID) ARPGX2
Influenza A by PCR: NEGATIVE
Influenza B by PCR: NEGATIVE
SARS Coronavirus 2 by RT PCR: NEGATIVE

## 2021-08-28 LAB — BRAIN NATRIURETIC PEPTIDE: B Natriuretic Peptide: 200 pg/mL — ABNORMAL HIGH (ref 0.0–100.0)

## 2021-08-28 MED ORDER — ZINC SULFATE 220 (50 ZN) MG PO CAPS
220.0000 mg | ORAL_CAPSULE | Freq: Every evening | ORAL | Status: DC
Start: 2021-08-29 — End: 2021-09-02
  Administered 2021-08-29 – 2021-09-01 (×4): 220 mg via ORAL
  Filled 2021-08-28 (×4): qty 1

## 2021-08-28 MED ORDER — EZETIMIBE 10 MG PO TABS
10.0000 mg | ORAL_TABLET | Freq: Every day | ORAL | Status: DC
  Administered 2021-08-29 – 2021-09-02 (×5): 10 mg via ORAL
  Filled 2021-08-28 (×5): qty 1

## 2021-08-28 MED ORDER — ONDANSETRON HCL 4 MG/2ML IJ SOLN: 4.0000 mg | Freq: Four times a day (QID) | INTRAMUSCULAR | Status: DC | PRN

## 2021-08-28 MED ORDER — METOPROLOL SUCCINATE ER 100 MG PO TB24
200.0000 mg | ORAL_TABLET | Freq: Every day | ORAL | Status: DC
  Administered 2021-08-29 – 2021-09-02 (×5): 200 mg via ORAL
  Filled 2021-08-28 (×6): qty 2

## 2021-08-28 MED ORDER — LORATADINE 10 MG PO TABS
10.0000 mg | ORAL_TABLET | Freq: Every day | ORAL | Status: DC
  Administered 2021-08-29 – 2021-09-02 (×5): 10 mg via ORAL
  Filled 2021-08-28 (×5): qty 1

## 2021-08-28 MED ORDER — ASCORBIC ACID 500 MG PO TABS
500.0000 mg | ORAL_TABLET | Freq: Two times a day (BID) | ORAL | Status: DC
  Administered 2021-08-28 – 2021-09-02 (×10): 500 mg via ORAL
  Filled 2021-08-28 (×10): qty 1

## 2021-08-28 MED ORDER — FUROSEMIDE 10 MG/ML IJ SOLN
40.0000 mg | Freq: Two times a day (BID) | INTRAMUSCULAR | Status: DC
  Administered 2021-08-29: 40 mg via INTRAVENOUS
  Filled 2021-08-28: qty 4

## 2021-08-28 MED ORDER — MELATONIN 5 MG PO TABS
5.0000 mg | ORAL_TABLET | Freq: Every day | ORAL | Status: DC
Start: 2021-08-28 — End: 2021-09-02
  Administered 2021-08-28 – 2021-09-01 (×5): 5 mg via ORAL
  Filled 2021-08-28 (×5): qty 1

## 2021-08-28 MED ORDER — TRAZODONE HCL 50 MG PO TABS
25.0000 mg | ORAL_TABLET | Freq: Every evening | ORAL | Status: DC | PRN
  Administered 2021-08-28: 25 mg via ORAL
  Filled 2021-08-28 (×2): qty 1

## 2021-08-28 MED ORDER — ALBUTEROL SULFATE (2.5 MG/3ML) 0.083% IN NEBU
2.5000 mg | INHALATION_SOLUTION | RESPIRATORY_TRACT | Status: DC | PRN
  Administered 2021-08-29: 2.5 mg via RESPIRATORY_TRACT
  Filled 2021-08-28: qty 3

## 2021-08-28 MED ORDER — ROSUVASTATIN CALCIUM 20 MG PO TABS
40.0000 mg | ORAL_TABLET | Freq: Every day | ORAL | Status: DC
  Administered 2021-08-29 – 2021-09-02 (×5): 40 mg via ORAL
  Filled 2021-08-28 (×5): qty 2

## 2021-08-28 MED ORDER — VITAMIN D 25 MCG (1000 UNIT) PO TABS
2000.0000 [IU] | ORAL_TABLET | Freq: Every day | ORAL | Status: DC
  Administered 2021-08-29 – 2021-09-02 (×5): 2000 [IU] via ORAL
  Filled 2021-08-28 (×5): qty 2

## 2021-08-28 MED ORDER — ACETAMINOPHEN 650 MG RE SUPP: 650.0000 mg | Freq: Four times a day (QID) | RECTAL | Status: DC | PRN

## 2021-08-28 MED ORDER — SACUBITRIL-VALSARTAN 24-26 MG PO TABS
1.0000 | ORAL_TABLET | Freq: Two times a day (BID) | ORAL | Status: DC
  Administered 2021-08-28 – 2021-09-02 (×10): 1 via ORAL
  Filled 2021-08-28 (×10): qty 1

## 2021-08-28 MED ORDER — ONDANSETRON HCL 4 MG PO TABS: 4.0000 mg | ORAL_TABLET | Freq: Four times a day (QID) | ORAL | Status: DC | PRN

## 2021-08-28 MED ORDER — OXYMETAZOLINE HCL 0.05 % NA SOLN
1.0000 | Freq: Two times a day (BID) | NASAL | Status: AC
  Administered 2021-08-28 – 2021-08-31 (×6): 1 via NASAL
  Filled 2021-08-28: qty 30

## 2021-08-28 MED ORDER — ALBUTEROL SULFATE HFA 108 (90 BASE) MCG/ACT IN AERS: 2.0000 | INHALATION_SPRAY | RESPIRATORY_TRACT | Status: DC | PRN

## 2021-08-28 MED ORDER — DAPAGLIFLOZIN PROPANEDIOL 10 MG PO TABS
10.0000 mg | ORAL_TABLET | Freq: Every day | ORAL | Status: DC
  Administered 2021-08-29 – 2021-08-31 (×3): 10 mg via ORAL
  Filled 2021-08-28 (×4): qty 1

## 2021-08-28 MED ORDER — APIXABAN 5 MG PO TABS
5.0000 mg | ORAL_TABLET | Freq: Two times a day (BID) | ORAL | Status: DC
  Administered 2021-08-28 – 2021-08-30 (×5): 5 mg via ORAL
  Filled 2021-08-28 (×5): qty 1

## 2021-08-28 MED ORDER — SPIRONOLACTONE 25 MG PO TABS
25.0000 mg | ORAL_TABLET | Freq: Every day | ORAL | Status: DC
  Administered 2021-08-29 – 2021-09-02 (×5): 25 mg via ORAL
  Filled 2021-08-28 (×5): qty 1

## 2021-08-28 MED ORDER — FLUOROURACIL 5 % EX CREA: 1.0000 "application " | TOPICAL_CREAM | Freq: Two times a day (BID) | CUTANEOUS | Status: DC

## 2021-08-28 MED ORDER — FOLIC ACID 1 MG PO TABS
500.0000 ug | ORAL_TABLET | Freq: Every evening | ORAL | Status: DC
  Administered 2021-08-29 – 2021-09-01 (×4): 0.5 mg via ORAL
  Filled 2021-08-28 (×4): qty 1

## 2021-08-28 MED ORDER — ASPIRIN EC 81 MG PO TBEC
81.0000 mg | DELAYED_RELEASE_TABLET | Freq: Every day | ORAL | Status: DC
  Administered 2021-08-29 – 2021-09-02 (×5): 81 mg via ORAL
  Filled 2021-08-28 (×5): qty 1

## 2021-08-28 MED ORDER — INSULIN ASPART 100 UNIT/ML IJ SOLN
0.0000 [IU] | Freq: Three times a day (TID) | INTRAMUSCULAR | Status: DC
  Administered 2021-08-30 (×2): 3 [IU] via SUBCUTANEOUS
  Administered 2021-08-31 (×3): 4 [IU] via SUBCUTANEOUS
  Administered 2021-09-01: 7 [IU] via SUBCUTANEOUS
  Administered 2021-09-01 – 2021-09-02 (×2): 3 [IU] via SUBCUTANEOUS

## 2021-08-28 MED ORDER — DIPHENHYDRAMINE-APAP (SLEEP) 25-500 MG PO TABS: 2.0000 | ORAL_TABLET | Freq: Every day | ORAL | Status: DC

## 2021-08-28 MED ORDER — DIPHENHYDRAMINE HCL 25 MG PO CAPS
50.0000 mg | ORAL_CAPSULE | Freq: Every day | ORAL | Status: DC
  Administered 2021-08-28 – 2021-09-01 (×5): 50 mg via ORAL
  Filled 2021-08-28 (×5): qty 2

## 2021-08-28 MED ORDER — ACETAMINOPHEN 325 MG PO TABS: 650.0000 mg | ORAL_TABLET | Freq: Four times a day (QID) | ORAL | Status: DC | PRN

## 2021-08-28 MED ORDER — SERTRALINE HCL 50 MG PO TABS
150.0000 mg | ORAL_TABLET | Freq: Every day | ORAL | Status: DC
  Administered 2021-08-29 – 2021-09-02 (×5): 150 mg via ORAL
  Filled 2021-08-28 (×5): qty 3

## 2021-08-28 MED ORDER — FUROSEMIDE 10 MG/ML IJ SOLN
80.0000 mg | Freq: Once | INTRAMUSCULAR | Status: AC
  Administered 2021-08-28: 80 mg via INTRAVENOUS
  Filled 2021-08-28: qty 8

## 2021-08-28 MED ORDER — MAGNESIUM HYDROXIDE 400 MG/5ML PO SUSP
30.0000 mL | Freq: Every day | ORAL | Status: DC | PRN
  Filled 2021-08-28: qty 30

## 2021-08-28 MED ORDER — POTASSIUM CHLORIDE CRYS ER 20 MEQ PO TBCR
40.0000 meq | EXTENDED_RELEASE_TABLET | Freq: Two times a day (BID) | ORAL | Status: DC
  Administered 2021-08-28 – 2021-08-30 (×4): 40 meq via ORAL
  Filled 2021-08-28 (×4): qty 2

## 2021-08-28 MED ORDER — ACETAMINOPHEN 500 MG PO TABS
1000.0000 mg | ORAL_TABLET | Freq: Every day | ORAL | Status: DC
Start: 2021-08-28 — End: 2021-09-02
  Administered 2021-08-28 – 2021-09-01 (×5): 1000 mg via ORAL
  Filled 2021-08-28 (×5): qty 2

## 2021-08-28 MED ORDER — ADULT MULTIVITAMIN W/MINERALS CH
1.0000 | ORAL_TABLET | Freq: Every day | ORAL | Status: DC
  Administered 2021-08-29 – 2021-09-02 (×5): 1 via ORAL
  Filled 2021-08-28 (×5): qty 1

## 2021-08-28 MED ORDER — OMEGA-3-ACID ETHYL ESTERS 1 G PO CAPS
2.0000 g | ORAL_CAPSULE | Freq: Two times a day (BID) | ORAL | Status: DC
  Administered 2021-08-28 – 2021-09-02 (×10): 2 g via ORAL
  Filled 2021-08-28 (×11): qty 2

## 2021-08-28 MED ORDER — INSULIN DETEMIR 100 UNIT/ML ~~LOC~~ SOLN
50.0000 [IU] | Freq: Every day | SUBCUTANEOUS | Status: DC
  Administered 2021-08-28: 50 [IU] via SUBCUTANEOUS
  Filled 2021-08-28 (×2): qty 0.5

## 2021-08-28 NOTE — ED Notes (Signed)
Pt refuses to take compression stockings off lower legs. Pt states they are too hard to get back on and refuses to take them off.

## 2021-08-28 NOTE — ED Triage Notes (Signed)
Pt c/o worsening SOB. Pt was referred to ED after doctor said he was having worsening CHF exacerbation (BNP was 353 on 2/21). Pt notes increase in daily weight. Normally on 2-3 L O2 at home, now increased to 4 lpm. Pt denies CP.   During triage pt endorses SI with plan to shoot himself with a gun due to feelings of despair about recent illnesses. Pt states he does have access to firearms at home.

## 2021-08-28 NOTE — H&P (Addendum)
[place]   PATIENT NAME: Tommy Riley    MR#:  [id number]  DATE OF BIRTH:  09/12/1945  DATE OF ADMISSION:  08/28/2021  PRIMARY CARE PHYSICIAN: Pomposini, [name], MD   Patient is coming from: Home  REQUESTING/REFERRING PHYSICIAN: Lajean Saver, MD   CHIEF COMPLAINT:   Chief Complaint  Patient presents with   Shortness of Breath    HISTORY OF PRESENT ILLNESS:  Tommy Riley is a 76 y.o. Caucasian male with medical history significant for atrial fibrillation and flutter on Eliquis, chronic systolic CHF, OSA on CPAP, COPD, chronic respiratory failure on home O2 at 2 to 2-1/2 L/min, hypertension, dyslipidemia, type 2 diabetes mellitus, obesity and CVA, presented to the emergency room with acute onset of worsening dyspnea with associated abdominal swelling and bilateral lower extremity edema over the last couple weeks.  He has noticed increased oxygen requirement from 2 to 4 L/min.  He has been compliant with his medications as well as his CPAP.  He admitted to orthopnea sleeping in a sleep comfort bed at 45 degree, dyspnea on exertion and denies PND since he uses CPAP.  No fever or chills.  No cough or wheezing.  No nausea or vomiting or abdominal pain.  No dysuria, oliguria or hematuria or flank pain.  ED Course: When he came to the ER, respiratory rate was 22 and heart rate was 115 and pulse currently was up to 92% on 2 L and later 94%.  Labs revealed a blood glucose of 180 and CO2 of 33 with a creatinine 1.25, BNP of 200 and high-sensitivity troponin I of 54.  CBC was within normal. EKG as reviewed by me : Showed atrial fibrillation with rapid ventricular response of 106, left bundle branch block and occasional ventricular paced complexes and PVCs. Imaging: 2 view chest x-ray showed findings consistent with CHF with cardiomegaly and pulmonary edema as well as small bilateral pleural effusions.  The patient was given 80 mg of IV Lasix.  He will be admitted to a cardiac  telemetry bed for further evaluation and management. PAST MEDICAL HISTORY:   Past Medical History:  Diagnosis Date   Anxiety    Atrial fibrillation and flutter (HCC)    Atrial fibrillation ablation 2011 at [facility]   CAD in native artery    Chronic systolic heart failure (HCC)    CKD (chronic kidney disease), stage III (HCC)    COPD (chronic obstructive pulmonary disease) ([place])    COVID-22 February 2020   Depression    Essential hypertension    Gout    Hyperlipidemia    Ischemic heart disease    OSA (obstructive sleep apnea)    Stroke ([place]) 2003   Type 2 diabetes mellitus ([place])     PAST SURGICAL HISTORY:   Past Surgical History:  Procedure Laterality Date   ATRIAL FIBRILLATION ABLATION  2011   [facility]   AV NODE ABLATION N/A 05/07/2021   Procedure: AV NODE ABLATION;  Surgeon: Evans Lance, MD;  Location: [place] [facility];  Service: Cardiovascular;  Laterality: N/A;   CATARACT EXTRACTION Bilateral    CORONARY ARTERY BYPASS GRAFT  2001   HERNIA REPAIR Right    ICD IMPLANT N/A 05/07/2021   Procedure: ICD IMPLANT;  Surgeon: Evans Lance, MD;  Location: [place] [facility];  Service: Cardiovascular;  Laterality: N/A;   RIGHT/LEFT HEART CATH AND CORONARY/GRAFT ANGIOGRAPHY N/A 05/05/2021   Procedure: RIGHT/LEFT HEART CATH AND CORONARY/GRAFT ANGIOGRAPHY;  Surgeon: Lorretta Harp, MD;  Location: [place] [facility];  Service: Cardiovascular;  Laterality: N/A;   TONSILLECTOMY AND ADENOIDECTOMY      SOCIAL HISTORY:   Social History   Tobacco Use   Smoking status: Former    Packs/day: 2.50    Years: 50.00    Pack years: 125.00    Types: Cigarettes    Quit date: 06/17/2011    Years since quitting: 10.2   Smokeless tobacco: Never  Substance Use Topics   Alcohol use: No    Comment: 1 beer a month    FAMILY HISTORY:   Family History  Problem Relation Age of Onset   Raynaud syndrome Mother    Stroke Father    Diabetes Mellitus II Father    Hypertension Father     Hypertension Sister    Diabetes Mellitus II Sister    Stroke Brother    Crohn's disease Brother    Breast cancer Brother    COPD Brother     DRUG ALLERGIES:   Allergies  Allergen Reactions   Codeine Anaphylaxis, Shortness Of Breath and Other (See Comments)    Tightness in chest     REVIEW OF SYSTEMS:   ROS As per history of present illness. All pertinent systems were reviewed above. Constitutional, HEENT, cardiovascular, respiratory, GI, GU, musculoskeletal, neuro, psychiatric, endocrine, integumentary and hematologic systems were reviewed and are otherwise negative/unremarkable except for positive findings mentioned above in the HPI.   MEDICATIONS AT HOME:   Prior to Admission medications   Medication Sig Start Date End Date Taking? Authorizing Provider  ACCU-CHEK AVIVA PLUS test strip  07/09/14   [provider]  albuterol (PROVENTIL) (2.5 MG/3ML) 0.083% nebulizer solution Take 3 mLs (2.5 mg total) by nebulization every 6 (six) hours as needed for wheezing or shortness of breath. 08/26/21   Martyn Ehrich, NP  albuterol (VENTOLIN HFA) 108 (90 Base) MCG/ACT inhaler USE 2 INHALATIONS BY MOUTH  EVERY 4 HOURS AS NEEDED FOR SHORTNESS OF BREATH OR  WHEEZE 06/23/21   Byrum, Rose Fillers, MD  apixaban (ELIQUIS) 5 MG TABS tablet Take 1 tablet (5 mg total) by mouth 2 (two) times daily. 05/10/21 11/06/21  Almyra Deforest, PA  aspirin 81 MG EC tablet Take 81 mg by mouth in the morning.    [provider]  Blood Glucose Monitoring Suppl (ONE TOUCH ULTRA 2) W/DEVICE KIT by Does not apply route.    [provider]  Cholecalciferol (VITAMIN D3) 2000 UNITS TABS Take 1 capsule by mouth in the morning and at bedtime.    [provider]  diphenhydramine-acetaminophen (TYLENOL PM) 25-500 MG TABS tablet Take 2 tablets by mouth at bedtime.    [provider]  ezetimibe (ZETIA) 10 MG tablet Take 10 mg by mouth in the morning.    [provider]  FARXIGA 10 MG  TABS tablet Take 1 tablet (10 mg total) by mouth daily. 12/27/20   Satira Sark, MD  fluorouracil (EFUDEX) 5 % cream Apply 1 application topically in the morning, at noon, in the evening, and at bedtime.    [provider]  Fluticasone-Umeclidin-Vilant (TRELEGY ELLIPTA) 100-62.5-25 MCG/INH AEPB Inhale 1 puff into the lungs daily. Patient needs 90 day script with 3 refills. 04/22/21   Deneise Lever, MD  folic acid (FOLVITE) 518 MCG tablet Take 400 mcg by mouth every evening.    [provider]  insulin detemir (LEVEMIR FLEXPEN) 100 UNIT/ML FlexPen Inject 50 Units into the skin in  the morning and at bedtime.    [provider]  insulin lispro (HUMALOG) 100 UNIT/ML injection Inject 0-20 Units into the skin 4 (four) times daily -  before meals and at bedtime. Sliding Scale Insulin    [provider]  loratadine (CLARITIN) 10 MG tablet Take 10 mg by mouth in the morning.    [provider]  melatonin 5 MG TABS Take 5 mg by mouth at bedtime.    [provider]  metoprolol (TOPROL-XL) 200 MG 24 hr tablet Take 1 tablet (200 mg total) by mouth daily. Take with or immediately following a meal. 06/09/21   Satira Sark, MD  Multiple Vitamin (MULTIVITAMIN WITH MINERALS) TABS tablet Take 1 tablet by mouth in the morning.    [provider]  omega-3 acid ethyl esters (LOVAZA) 1 g capsule Take 2 capsules (2 g total) by mouth 2 (two) times daily. 05/09/21   Almyra Deforest, PA  oxymetazoline (AFRIN) 0.05 % nasal spray Place 1 spray into both nostrils 2 (two) times daily.    [provider]  potassium chloride SA (KLOR-CON M) 20 MEQ tablet Take 2 tablets (40 mEq total) by mouth 2 (two) times daily. 06/09/21   Satira Sark, MD  rosuvastatin (CRESTOR) 40 MG tablet Take 1 tablet (40 mg total) by mouth daily. 06/09/21   Satira Sark, MD  sacubitril-valsartan (ENTRESTO) 24-26 MG Take 1 tablet by mouth 2 (two) times daily. 07/22/21    Satira Sark, MD  sertraline (ZOLOFT) 100 MG tablet Take 150 mg by mouth in the morning. 08/04/21   [provider]  spironolactone (ALDACTONE) 25 MG tablet Take 1 tablet (25 mg total) by mouth daily. 06/09/21   Satira Sark, MD  torsemide (DEMADEX) 20 MG tablet Take 4 tablets (80 mg total) by mouth 2 (two) times daily. May take an additional 20 mg tablet daily as needed for 2-3 lbs weight gain in one day or 5 lbs in 1 week 08/12/21   Satira Sark, MD  vitamin C (ASCORBIC ACID) 500 MG tablet Take 500 mg by mouth in the morning and at bedtime.    [provider]  zinc sulfate 220 (50 Zn) MG capsule Take 220 mg by mouth every evening.    [provider]      VITAL SIGNS:  Blood pressure (!) 132/92, pulse 78, temperature 98.5 F (36.9 C), temperature source Oral, resp. rate (!) 30, height 6' (1.829 m), weight 127.5 kg, SpO2 94 %.  PHYSICAL EXAMINATION:  Physical Exam  GENERAL:  76 y.o.-year-old Caucasian male patient lying in the bed with no acute distress.  EYES: Pupils equal, round, reactive to light and accommodation. No scleral icterus. Extraocular muscles intact.  HEENT: Head atraumatic, normocephalic. Oropharynx and nasopharynx clear.  NECK:  Supple, no jugular venous distention. No thyroid enlargement, no tenderness.  LUNGS: Diminished bibasilar breath sounds with bibasilar rales.  No use of accessory muscles of respiration.  CARDIOVASCULAR: Regular rate and rhythm, S1, S2 normal. No murmurs, rubs, or gallops.  ABDOMEN: Soft, nondistended, nontender. Bowel sounds present. No organomegaly or mass.  EXTREMITIES: 1-2+ bilateral lower extremity pitting edema with no cyanosis, or clubbing.  NEUROLOGIC: Cranial nerves II through XII are intact. Muscle strength 5/5 in all extremities. Sensation intact. Gait not checked.  PSYCHIATRIC: The patient is alert and oriented x 3.  Normal affect and good eye contact. SKIN: No obvious rash, lesion, or ulcer.    LABORATORY PANEL:   CBC Recent Labs  Lab 08/28/21 1821  WBC 5.4  HGB 13.0  HCT 43.2  PLT 192   ------------------------------------------------------------------------------------------------------------------  Chemistries  Recent Labs  Lab 08/28/21 1821  NA 143  K 3.6  CL 100  CO2 33*  GLUCOSE 180*  BUN 23  CREATININE 1.25*  CALCIUM 9.1   ------------------------------------------------------------------------------------------------------------------  Cardiac Enzymes No results for input(s): TROPONINI in the last 168 hours. ------------------------------------------------------------------------------------------------------------------  RADIOLOGY:  DG Chest 2 View  Result Date: 08/28/2021 CLINICAL DATA:  Shortness of breath.  Extremity swelling. EXAM: CHEST - 2 VIEW COMPARISON:  Radiograph 05/19/2021.  Chest CT 04/23/2020 FINDINGS: Cardiomegaly with questionable increase from prior exam. Post median sternotomy and CABG. Left-sided pacemaker in place. There are small bilateral pleural effusions. Interstitial and septal thickening consistent with pulmonary edema. No pneumothorax. No confluent consolidation. No acute osseous abnormalities are seen. IMPRESSION: Findings consistent with CHF. Cardiomegaly with pulmonary edema and small pleural effusions. Electronically Signed   By: Keith Rake M.D.   On: 08/28/2021 19:56      IMPRESSION AND PLAN:  Principal Problem:   Acute CHF (congestive heart failure) ([place])  1.  Acute on chronic systolic CHF. - The patient admitted to a cardiac telemetry bed. - We will diurese with IV Lasix. - We will follow serial troponins. - I's and O's will be monitored as well as daily weights. - We will continue Entresto, Farxiga and Aldactone. - The patient had a 2D echo on 04/14/2021 revealing an EF of 35 to 40% with indeterminate diastolic function, severe left atrial as well as right atrial dilatation and trivial mitral valve  regurgitation. - We will defer cardiology evaluation for the morning hospitalist should need be.  2.  Atrial fibrillation with RVR. - We will continue Eliquis and Toprol-XL. - We will utilize digoxin and IV Cardizem as needed.  3.  Dyslipidemia. - Continue statin therapy.  4.  Type 2 diabetes mellitus. - The patient will be placed on supplement coverage with NovoLog. - We will continue his basal coverage.  5.  COPD without exacerbation. - We will continue his inhaler while holding off long-acting beta agonist given his history of CHF.  6.  Depression. - We will continue Zoloft.   DVT prophylaxis: Eliquis Advanced Care Planning:  Code Status: DNI only Family Communication:  The plan of care was discussed in details with the patient (and family). I answered all questions. The patient agreed to proceed with the above mentioned plan. Further management will depend upon hospital course. Disposition Plan: Back to previous home environment Consults called: none. All the records are reviewed and case discussed with ED provider.  Status is: Inpatient   At the time of the admission, it appears that the appropriate admission status for this patient is inpatient.  This is judged to be reasonable and necessary in order to provide the required intensity of service to ensure the patient's safety given the presenting symptoms, physical exam findings and initial radiographic and laboratory data in the context of comorbid conditions.  The patient requires inpatient status due to high intensity of service, high risk of further deterioration and high frequency of surveillance required.  I certify that at the time of admission, it is my clinical judgment that the patient will require inpatient hospital care extending more than 2 midnights.                            Dispo: The patient is from: Home  Anticipated d/c is to: Home              Patient currently is not medically stable to  d/c.              Difficult to place patient: No   Christel Mormon M.D on 08/28/2021 at 9:05 PM  [facility]   From 7 PM-7 AM, contact night-coverage [email or web address]  CC: Primary care physician; Pomposini, [name], MD

## 2021-08-28 NOTE — ED Notes (Signed)
I'm UTA lower extremities or edema of extremities due to the compression stockings on

## 2021-08-28 NOTE — ED Provider Triage Note (Addendum)
Emergency Medicine Provider Triage Evaluation Note  Tommy Riley , a 76 y.o. male  was evaluated in triage.  Pt complains of shortness of breath.  Shortness of breath and bilateral leg and arm swelling have been worsening for 2 weeks. He is normally on 2 L O2 at home, but when he went to Urgent Care today, he was found to be hypoxic and they increased his O2 to 4 L/min. He was recently admitted for CHF exasperation back in October or November.  Denies chest pain, n/v.  Of note, patient endorses SI with an active plan.   Review of Systems  Positive: Shortness of breath, leg swelling Negative:   Physical Exam  BP 117/76 (BP Location: Left Arm)    Pulse (!) 115    Temp 98.5 F (36.9 C) (Oral)    Resp (!) 22    SpO2 96%  Gen:   Awake, no distress   Resp:  Normal effort  MSK:   Moves extremities without difficulty  Other:  Bilateral LE pitting edema. Bilateral UE edema. Lungs with bilateral base rales.  Medical Decision Making  Medically screening exam initiated at 5:57 PM.  Appropriate orders placed.  Celene Kras was informed that the remainder of the evaluation will be completed by another provider, this initial triage assessment does not replace that evaluation, and the importance of remaining in the ED until their evaluation is complete.     Adolphus Birchwood, PA-C 08/28/21 1806    Adolphus Birchwood, [place] 08/28/21 1807

## 2021-08-28 NOTE — ED Provider Notes (Signed)
[facility] Provider Note   CSN: [id number] Arrival date & time: 08/28/21  1748     History  Chief Complaint  Patient presents with   Shortness of Breath    Tommy Riley is a 76 y.o. male.  Patient with hx chf, home o2, c/o increased sob in the past 1-2 weeks, and increased bilateral leg swelling. States recently saw cardiology and pulmonary - denies change in meds, was called and told bnp high and to go to ER.  Patient denies current or recent chest pain or discomfort. No cough or uri symptoms. No fever or chills. Denies abd pain or nv. No calf pain, +symmetric leg edema. States compliant w home meds, and is urinating normal amount. +orthopnea.   The history is provided by the patient, the spouse and medical records.  Shortness of Breath Associated symptoms: no abdominal pain, no chest pain, no cough, no fever, no headaches, no neck pain, no rash, no sore throat and no vomiting       Home Medications Prior to Admission medications   Medication Sig Start Date End Date Taking? Authorizing Provider  ACCU-CHEK AVIVA PLUS test strip  07/09/14   [provider]  albuterol (PROVENTIL) (2.5 MG/3ML) 0.083% nebulizer solution Take 3 mLs (2.5 mg total) by nebulization every 6 (six) hours as needed for wheezing or shortness of breath. 08/26/21   Martyn Ehrich, NP  albuterol (VENTOLIN HFA) 108 (90 Base) MCG/ACT inhaler USE 2 INHALATIONS BY MOUTH  EVERY 4 HOURS AS NEEDED FOR SHORTNESS OF BREATH OR  WHEEZE 06/23/21   Byrum, Rose Fillers, MD  apixaban (ELIQUIS) 5 MG TABS tablet Take 1 tablet (5 mg total) by mouth 2 (two) times daily. 05/10/21 11/06/21  Almyra Deforest, PA  aspirin 81 MG EC tablet Take 81 mg by mouth in the morning.    [provider]  Blood Glucose Monitoring Suppl (ONE TOUCH ULTRA 2) W/DEVICE KIT by Does not apply route.    [provider]  Cholecalciferol (VITAMIN D3) 2000 UNITS TABS Take 1 capsule by mouth in the  morning and at bedtime.    [provider]  diphenhydramine-acetaminophen (TYLENOL PM) 25-500 MG TABS tablet Take 2 tablets by mouth at bedtime.    [provider]  ezetimibe (ZETIA) 10 MG tablet Take 10 mg by mouth in the morning.    [provider]  FARXIGA 10 MG TABS tablet Take 1 tablet (10 mg total) by mouth daily. 12/27/20   Satira Sark, MD  fluorouracil (EFUDEX) 5 % cream Apply 1 application topically in the morning, at noon, in the evening, and at bedtime.    [provider]  Fluticasone-Umeclidin-Vilant (TRELEGY ELLIPTA) 100-62.5-25 MCG/INH AEPB Inhale 1 puff into the lungs daily. Patient needs 90 day script with 3 refills. 04/22/21   Deneise Lever, MD  folic acid (FOLVITE) 354 MCG tablet Take 400 mcg by mouth every evening.    [provider]  insulin detemir (LEVEMIR FLEXPEN) 100 UNIT/ML FlexPen Inject 50 Units into the skin in the morning and at bedtime.    [provider]  insulin lispro (HUMALOG) 100 UNIT/ML injection Inject 0-20 Units into the skin 4 (four) times daily -  before meals and at bedtime. Sliding Scale Insulin    [provider]  loratadine (CLARITIN) 10 MG tablet Take 10 mg by mouth in the morning.    [provider]  melatonin 5 MG TABS Take 5 mg by mouth at bedtime.  [provider]  metoprolol (TOPROL-XL) 200 MG 24 hr tablet Take 1 tablet (200 mg total) by mouth daily. Take with or immediately following a meal. 06/09/21   Satira Sark, MD  Multiple Vitamin (MULTIVITAMIN WITH MINERALS) TABS tablet Take 1 tablet by mouth in the morning.    [provider]  omega-3 acid ethyl esters (LOVAZA) 1 g capsule Take 2 capsules (2 g total) by mouth 2 (two) times daily. 05/09/21   Almyra Deforest, PA  oxymetazoline (AFRIN) 0.05 % nasal spray Place 1 spray into both nostrils 2 (two) times daily.    [provider]  potassium chloride SA (KLOR-CON M) 20 MEQ tablet Take 2 tablets  (40 mEq total) by mouth 2 (two) times daily. 06/09/21   Satira Sark, MD  rosuvastatin (CRESTOR) 40 MG tablet Take 1 tablet (40 mg total) by mouth daily. 06/09/21   Satira Sark, MD  sacubitril-valsartan (ENTRESTO) 24-26 MG Take 1 tablet by mouth 2 (two) times daily. 07/22/21   Satira Sark, MD  sertraline (ZOLOFT) 100 MG tablet Take 150 mg by mouth in the morning. 08/04/21   [provider]  spironolactone (ALDACTONE) 25 MG tablet Take 1 tablet (25 mg total) by mouth daily. 06/09/21   Satira Sark, MD  torsemide (DEMADEX) 20 MG tablet Take 4 tablets (80 mg total) by mouth 2 (two) times daily. May take an additional 20 mg tablet daily as needed for 2-3 lbs weight gain in one day or 5 lbs in 1 week 08/12/21   Satira Sark, MD  vitamin C (ASCORBIC ACID) 500 MG tablet Take 500 mg by mouth in the morning and at bedtime.    [provider]  zinc sulfate 220 (50 Zn) MG capsule Take 220 mg by mouth every evening.    [provider]      Allergies    Codeine    Review of Systems   Review of Systems  Constitutional:  Negative for fever.  HENT:  Negative for sore throat.   Eyes:  Negative for redness.  Respiratory:  Positive for shortness of breath. Negative for cough.   Cardiovascular:  Positive for leg swelling. Negative for chest pain.  Gastrointestinal:  Negative for abdominal pain and vomiting.  Genitourinary:  Negative for flank pain.  Musculoskeletal:  Negative for back pain and neck pain.  Skin:  Negative for rash.  Neurological:  Negative for headaches.  Hematological:  Does not bruise/bleed easily.  Psychiatric/Behavioral:  Negative for confusion.    Physical Exam Updated Vital Signs BP 117/76 (BP Location: Left Arm)    Pulse (!) 115    Temp 98.5 F (36.9 C) (Oral)    Resp (!) 22    Ht 1.829 m (6')    Wt 127.5 kg    SpO2 96%    BMI 38.11 kg/m  Physical Exam Vitals and nursing note reviewed.  Constitutional:      Appearance: Normal  appearance. He is well-developed.  HENT:     Head: Atraumatic.     Nose: Nose normal.     Mouth/Throat:     Mouth: Mucous membranes are moist.  Eyes:     General: No scleral icterus.    Conjunctiva/sclera: Conjunctivae normal.  Neck:     Trachea: No tracheal deviation.  Cardiovascular:     Rate and Rhythm: Tachycardia present. Rhythm irregular.     Pulses: Normal pulses.     Heart sounds: Normal heart sounds. No murmur heard.  No friction rub. No gallop.  Pulmonary:     Effort: Pulmonary effort is normal. No accessory muscle usage or respiratory distress.     Breath sounds: Normal breath sounds.  Abdominal:     General: Bowel sounds are normal. There is no distension.     Palpations: Abdomen is soft. There is no mass.     Tenderness: There is no abdominal tenderness. There is no guarding or rebound.     Hernia: No hernia is present.     Comments: obese  Genitourinary:    Comments: No cva tenderness. Musculoskeletal:     Cervical back: Normal range of motion and neck supple. No rigidity.     Comments: Symmetric bilateral leg edema.   Skin:    General: Skin is warm and dry.     Findings: No rash.  Neurological:     Mental Status: He is alert.     Comments: Alert, speech clear.   Psychiatric:        Mood and Affect: Mood normal.    ED Results / Procedures / Treatments   Labs (all labs ordered are listed, but only abnormal results are displayed) Results for orders placed or performed during the hospital encounter of 77/41/28  Basic metabolic panel  Result Value Ref Range   Sodium 143 135 - 145 mmol/L   Potassium 3.6 3.5 - 5.1 mmol/L   Chloride 100 98 - 111 mmol/L   CO2 33 (H) 22 - 32 mmol/L   Glucose, Bld 180 (H) 70 - 99 mg/dL   BUN 23 8 - 23 mg/dL   Creatinine, Ser 1.25 (H) 0.61 - 1.24 mg/dL   Calcium 9.1 8.9 - 10.3 mg/dL   GFR, Estimated >60 >60 mL/min   Anion gap 10 5 - 15  CBC with Differential  Result Value Ref Range   WBC 5.4 4.0 - 10.5 K/uL   RBC 4.74  4.22 - 5.81 MIL/uL   Hemoglobin 13.0 13.0 - 17.0 g/dL   HCT 43.2 39.0 - 52.0 %   MCV 91.1 80.0 - 100.0 fL   MCH 27.4 26.0 - 34.0 pg   MCHC 30.1 30.0 - 36.0 g/dL   RDW 17.0 (H) 11.5 - 15.5 %   Platelets 192 150 - 400 K/uL   nRBC 0.0 0.0 - 0.2 %   Neutrophils Relative % 78 %   Neutro Abs 4.2 1.7 - 7.7 K/uL   Lymphocytes Relative 11 %   Lymphs Abs 0.6 (L) 0.7 - 4.0 K/uL   Monocytes Relative 8 %   Monocytes Absolute 0.5 0.1 - 1.0 K/uL   Eosinophils Relative 2 %   Eosinophils Absolute 0.1 0.0 - 0.5 K/uL   Basophils Relative 1 %   Basophils Absolute 0.0 0.0 - 0.1 K/uL   Immature Granulocytes 0 %   Abs Immature Granulocytes 0.02 0.00 - 0.07 K/uL  Troponin I (High Sensitivity)  Result Value Ref Range   Troponin I (High Sensitivity) 54 (H) <18 ng/L   CUP PACEART REMOTE DEVICE CHECK  Result Date: 08/07/2021 Scheduled remote reviewed. Normal device function.  HL=29 Next remote 91 days. LA    EKG EKG Interpretation  Date/Time:  Thursday August 28 2021 17:59:45 EST Ventricular Rate:  106 PR Interval:    QRS Duration: 142 QT Interval:  396 QTC Calculation: 526 R Axis:   134 Text Interpretation: Atrial fibrillation with rapid ventricular response with occasional ventricular-paced complexes and with premature ventricular or aberrantly conducted complexes Left bundle branch block Non-specific ST-t changes Baseline  wander Confirmed by Lajean Saver [phone]) on 08/28/2021 6:52:30 PM  Radiology DG Chest 2 View  Result Date: 08/28/2021 CLINICAL DATA:  Shortness of breath.  Extremity swelling. EXAM: CHEST - 2 VIEW COMPARISON:  Radiograph 05/19/2021.  Chest CT 04/23/2020 FINDINGS: Cardiomegaly with questionable increase from prior exam. Post median sternotomy and CABG. Left-sided pacemaker in place. There are small bilateral pleural effusions. Interstitial and septal thickening consistent with pulmonary edema. No pneumothorax. No confluent consolidation. No acute osseous abnormalities are seen.  IMPRESSION: Findings consistent with CHF. Cardiomegaly with pulmonary edema and small pleural effusions. Electronically Signed   By: Keith Rake M.D.   On: 08/28/2021 19:56    Procedures Procedures    Medications Ordered in ED Medications - No data to display  ED Course/ Medical Decision Making/ A&P                           Medical Decision Making Problems Addressed: Acute on chronic combined systolic and diastolic CHF (congestive heart failure) ([place]): acute illness or injury with systemic symptoms that poses a threat to life or bodily functions Diabetes mellitus type 2, insulin dependent ([place]): chronic illness or injury Elevated troponin: chronic illness or injury Essential hypertension: chronic illness or injury Persistent atrial fibrillation ([place]): chronic illness or injury that poses a threat to life or bodily functions Pleural effusion: chronic illness or injury with exacerbation, progression, or side effects of treatment Shortness of breath: chronic illness or injury with exacerbation, progression, or side effects of treatment  Amount and/or Complexity of Data Reviewed Independent Historian:     Details: family member - additional hx. External Data Reviewed: labs and notes. Labs: ordered. Decision-making details documented in ED Course. Radiology: ordered and independent interpretation performed. Decision-making details documented in ED Course. ECG/medicine tests: ordered and independent interpretation performed. Decision-making details documented in ED Course. Discussion of management or test interpretation with external provider(s): Admitting medicine - discussed pt, labs, imaging.   Risk Prescription drug management. Decision regarding hospitalization.   Iv ns. Continuous pulse ox and cardiac monitoring. Ecg. Stat labs. Pcxr.   Reviewed nursing notes and prior charts for additional history. External reports reviewed. Additional hx from family member.   Cardiac  monitor: afib, rate  102.  Labs reviewed/interpreted by me - trop mildly elev, less than prior.   CXR reviewed/interpreted by me - chf.   Lasix iv.   Medicine consulted for admission.            Final Clinical Impression(s) / ED Diagnoses Final diagnoses:  None    Rx / DC Orders ED Discharge Orders     None         Lajean Saver, MD 08/28/21 2031

## 2021-08-29 DIAGNOSIS — E1169 Type 2 diabetes mellitus with other specified complication: Secondary | ICD-10-CM

## 2021-08-29 DIAGNOSIS — G4733 Obstructive sleep apnea (adult) (pediatric): Secondary | ICD-10-CM

## 2021-08-29 DIAGNOSIS — I4821 Permanent atrial fibrillation: Secondary | ICD-10-CM

## 2021-08-29 DIAGNOSIS — I509 Heart failure, unspecified: Secondary | ICD-10-CM

## 2021-08-29 DIAGNOSIS — Z9989 Dependence on other enabling machines and devices: Secondary | ICD-10-CM

## 2021-08-29 DIAGNOSIS — J438 Other emphysema: Secondary | ICD-10-CM

## 2021-08-29 LAB — BASIC METABOLIC PANEL
Anion gap: 12 (ref 5–15)
BUN: 22 mg/dL (ref 8–23)
CO2: 28 mmol/L (ref 22–32)
Calcium: 9.2 mg/dL (ref 8.9–10.3)
Chloride: 104 mmol/L (ref 98–111)
Creatinine, Ser: 1.22 mg/dL (ref 0.61–1.24)
GFR, Estimated: >60 mL/min (ref >60)
Glucose, Bld: 116 mg/dL — ABNORMAL HIGH (ref 70–99)
Potassium: 4.1 mmol/L (ref 3.5–5.1)
Sodium: 144 mmol/L (ref 135–145)

## 2021-08-29 LAB — GLUCOSE, CAPILLARY
Glucose-Capillary: 112 mg/dL — ABNORMAL HIGH (ref 70–99)
Glucose-Capillary: 121 mg/dL — ABNORMAL HIGH (ref 70–99)

## 2021-08-29 LAB — CBC
HCT: 47.2 % (ref 39.0–52.0)
Hemoglobin: 14.1 g/dL (ref 13.0–17.0)
MCH: 27.5 pg (ref 26.0–34.0)
MCHC: 29.9 g/dL — ABNORMAL LOW (ref 30.0–36.0)
MCV: 92.2 fL (ref 80.0–100.0)
Platelets: 120 10*3/uL — ABNORMAL LOW (ref 150–400)
RBC: 5.12 MIL/uL (ref 4.22–5.81)
RDW: 17 % — ABNORMAL HIGH (ref 11.5–15.5)
WBC: 5 10*3/uL (ref 4.0–10.5)
nRBC: 0 % (ref 0.0–0.2)

## 2021-08-29 LAB — HEMOGLOBIN A1C
Hgb A1c MFr Bld: 6.4 % — ABNORMAL HIGH (ref 4.8–5.6)
Mean Plasma Glucose: 136.98 mg/dL

## 2021-08-29 LAB — CBG MONITORING, ED: Glucose-Capillary: 88 mg/dL (ref 70–99)

## 2021-08-29 MED ORDER — INSULIN DETEMIR 100 UNIT/ML ~~LOC~~ SOLN
25.0000 [IU] | Freq: Every day | SUBCUTANEOUS | Status: DC
  Administered 2021-08-29 – 2021-09-01 (×4): 25 [IU] via SUBCUTANEOUS
  Filled 2021-08-29 (×7): qty 0.25

## 2021-08-29 MED ORDER — ALBUTEROL SULFATE (2.5 MG/3ML) 0.083% IN NEBU
2.5000 mg | INHALATION_SOLUTION | Freq: Three times a day (TID) | RESPIRATORY_TRACT | Status: DC
  Administered 2021-08-30 – 2021-09-02 (×11): 2.5 mg via RESPIRATORY_TRACT
  Filled 2021-08-29 (×11): qty 3

## 2021-08-29 MED ORDER — FUROSEMIDE 10 MG/ML IJ SOLN
60.0000 mg | Freq: Two times a day (BID) | INTRAMUSCULAR | Status: DC
  Administered 2021-08-29 – 2021-09-02 (×8): 60 mg via INTRAVENOUS
  Filled 2021-08-29 (×9): qty 6

## 2021-08-29 NOTE — Assessment & Plan Note (Addendum)
His glucose remained stable during his hospitalization.  At the time of his discharge his fasting glucose was 98 mg/dl  Plan to continue insulin therapy at home.   Continue with statin therapy.

## 2021-08-29 NOTE — TOC Progression Note (Signed)
Transition of Care [facility]) - Progression Note    Patient Details  Name: Tommy Riley MRN: [id number] Date of Birth: [birth year]  Transition of Care [facility]) CM/SW Contact  Zenon Mayo, RN Phone Number: 08/29/2021, 4:47 PM  Clinical Narrative:     Transition of Care [facility]) Screening Note   Patient Details  Name: Tommy Riley Date of Birth: Jan 02, 1946   Transition of Care [facility]) CM/SW Contact:    Zenon Mayo, RN Phone Number: 08/29/2021, 4:47 PM    Transition of Care Department [facility]) has reviewed patient and no TOC needs have been identified at this time. We will continue to monitor patient advancement through interdisciplinary progression rounds. If new patient transition needs arise, please place a TOC consult.          Expected Discharge Plan and Services                                                 Social Determinants of Health (SDOH) Interventions    Readmission Risk Interventions Readmission Risk Prevention Plan 04/16/2021  Transportation Screening Complete  Home Care Screening Complete  Medication Review (RN CM) Complete  Some recent data might be hidden

## 2021-08-29 NOTE — Assessment & Plan Note (Signed)
Calculated BMI is 38,1

## 2021-08-29 NOTE — ED Notes (Signed)
Micky Sheller wife [phone] requesting an update on the patient

## 2021-08-29 NOTE — Progress Notes (Addendum)
Pt arrived to the unit at 1247 alert and oriented x 4, with cane, eyeglasses, and CPAP machine with sterile water. Pt has a black bag with all other belongings.  Pt reports he is supposed to have an ablation on Monday September 01, 2021 with Dr. Lovena Le.

## 2021-08-29 NOTE — Progress Notes (Addendum)
Heart Failure Navigator Progress Note  Assessed for Heart & Vascular TOC clinic readiness.  Patient is already established in advanced CHF clinic - sees Dr. Haroldine Laws. Notified team of his admission.   Navigator available for educational resources.   Kerby Nora, PharmD, BCPS Heart Failure Stewardship Pharmacist Phone [phone]

## 2021-08-29 NOTE — Progress Notes (Signed)
Progress Note   Patient: Tommy Riley KGY:[id number] DOB: [birth year] DOA: 08/28/2021     1 DOS: the patient was seen and examined on 08/29/2021   Brief hospital course: Tommy Riley was admitted to the hospital with the working diagnosis of decompensated heart failure.   76 yo male with the past medical history of atrial fibrillation/ flutter, heart failure, COPD with chronic hypoxemic respiratory failure, T2DM, dyslipidemia and obesity class 3 who presented with dyspnea. Reported 2 weeks of worsening lower extremity edema, associated with dyspnea and increased abdominal girth. Positive orthopnea. He increased his oxygen flow with no improvement in his symptoms. On his initial physical examination his blood pressure was 132/92, HR 78, RR 30, oxygen saturation 94% and temperature 98,5, lungs with bibasilar rales with no wheezing, heart with S1 and S2 present, no gallops or murmurs, abdomen non distended and positive lower extremity edema.   Na 143, K 3,6, Cl 100, bicarbonate 33, glucose 180, bun 23 and cr 1,25 BNP 200 High sensitive troponin 54 and 57 Wbc 5.4 hgb 13.0 hct 43.2 and plt 192  Sars covid 19 negative   Chest radiograph with cardiomegaly with bilateral lower lobes interstitial infiltrates with small bilateral pleural effusions. (Pacer in place)   EKG 106 bpm, normal axis, left bundle branch block, qtc 526, atrial fibrillation rhythm with q wave V1 and V2 ST depression and  T wave inversion on lead II, III and AvF.   Assessment and Plan: * Acute CHF (congestive heart failure) ([place]) Patient continue to have dyspnea and lower extremity edema. Oxygenation is 94% on 4 L/min per [place]  Urine output not documented.  Plan to continue diuresis with furosemide to target further negative fluid balance. Increase dose to 60 mg IV q12 hrs Continue with dapagliflozin, spironolactone and metoprolol. Continue with entresto.   Permanent atrial fibrillation ([place])- (present on admission) Patient  with pacer in place, follows with Dr Lovena Le with plan for ablation next week. Continue rate control with metoprolol and continue diuresis with furosemide.  Anticoagulation with apixaban.   COPD (chronic obstructive pulmonary disease) ([place])- (present on admission) No clinical signs of exacerbation Continue with broncho dilator therapy. Continue oxymetry monitoring.   Obesity- (present on admission) Calculated BMI is 38,1   Type 2 diabetes mellitus with hyperlipidemia ([place])- (present on admission) Continue glucose cover and monitoring with insulins sliding scale. Continue with basal insulin therapy for glucose control.  Continue with statin.,   OSA on CPAP Continue with Cpap.        Subjective: patient continue to have dyspnea and lower extremity edema, not back to his baseline   Physical Exam: Vitals:   08/29/21 1130 08/29/21 1247 08/29/21 1300 08/29/21 1606  BP: 138/84 121/70  105/73  Pulse: 86 88 90 74  Resp: (!) 23 18  20   Temp:  97.7 F (36.5 C)  97.8 F (36.6 C)  TempSrc:  Oral  Oral  SpO2: 95% 96% 96% 94%  Weight:  127.7 kg    Height:  6' (1.829 m)     Neurology patient continue to be very weak and deconditioned ENT with pallor Cardiovascular with S1 and S2 present irregularly irregular with no gallops, rubs or murmurs Moderate JVD Positive lower extremity edema ++/+++ Respiratory with rales bilaterally with no wheezing, prolonged expiratory phase  Abdomen soft but not tender   Data Reviewed:    Family Communication: no family at the bedside   Disposition: Status is: Inpatient Remains inpatient appropriate because: heart failure management  Planned Discharge Destination: Home     Author: Tawni Millers, MD 08/29/2021 5:10 PM  For on call review [email or web address].

## 2021-08-29 NOTE — Hospital Course (Addendum)
Tommy Riley was admitted to the hospital with the working diagnosis of decompensated heart failure.   76 yo male with the past medical history of atrial fibrillation/ flutter, heart failure, COPD with chronic hypoxemic respiratory failure, T2DM, dyslipidemia and obesity class 3 who presented with dyspnea. Reported 2 weeks of worsening lower extremity edema, associated with dyspnea and increased abdominal girth. Positive orthopnea. He increased his oxygen flow with no improvement in his symptoms. On his initial physical examination his blood pressure was 132/92, HR 78, RR 30, oxygen saturation 94% and temperature 98,5, lungs with bibasilar rales with no wheezing, heart with S1 and S2 present, no gallops or murmurs, abdomen non distended and positive lower extremity edema.   Na 143, K 3,6, Cl 100, bicarbonate 33, glucose 180, bun 23 and cr 1,25 BNP 200 High sensitive troponin 54 and 57 Wbc 5.4 hgb 13.0 hct 43.2 and plt 192  Sars covid 19 negative   Chest radiograph with cardiomegaly with bilateral lower lobes interstitial infiltrates with small bilateral pleural effusions. (Pacer in place)   EKG 106 bpm, normal axis, left bundle branch block, qtc 526, atrial fibrillation rhythm with q wave V1 and V2 ST depression and  T wave inversion on lead II, III and AvF.   Patient has been placed on diuretic therapy with good response.  Improvement in volume status.   Electrophysiology was consulted and patient underwent AV node ablation with good toleration.   Patient will follow up as outpatient.

## 2021-08-29 NOTE — Progress Notes (Signed)
Home CPAP inspected by this RT for damage/frays and setup at bedside within pt's reach with 4L O2 bled in. Advised pt to notify for RT if any further assistance is needed.

## 2021-08-29 NOTE — Assessment & Plan Note (Addendum)
Uncontrolled chronic atrial fibrillation.  Patient had fast heart rate, rapid ventricular response despite nodal blocking agents and need for better CRT/ HR optimization.   Electrophysiology was consulted and patient underwent AV node ablation as scheduled with good toleration.   Patient will continue with metoprolol and plan to resume anticoagulation on 09/03/21.  Follow up as outpatient.

## 2021-08-29 NOTE — Assessment & Plan Note (Signed)
Continue with Cpap 

## 2021-08-29 NOTE — Assessment & Plan Note (Addendum)
Patient was admitted to the cardiac unit, he was placed on aggressive diuresis with furosemide with good toleration, negative fluid balance was achieved, - 5,760 ml with significant improvement in his symptoms.   Echocardiogram from 04/2021 with LV EF 35 to 40%, with global hypokinesis, mild reduction in RV systolic function, severe elevation in pulmonary systolic pressure up to 66 mmHg. Severe dilatation of right and left atriums.   Patient will continue medical therapy with entresto, dapagliflozin, spironolactone and metoprolol. Resume oral torsemide and plan to follow up as outpatient.

## 2021-08-29 NOTE — Assessment & Plan Note (Addendum)
No signs acute  exacerbation Continue with bronchodilator therapy. Continue supplemental -02 per [place]

## 2021-08-30 DIAGNOSIS — M109 Gout, unspecified: Secondary | ICD-10-CM | POA: Diagnosis present

## 2021-08-30 DIAGNOSIS — N182 Chronic kidney disease, stage 2 (mild): Secondary | ICD-10-CM | POA: Diagnosis present

## 2021-08-30 LAB — GLUCOSE, CAPILLARY
Glucose-Capillary: 95 mg/dL (ref 70–99)
Glucose-Capillary: 135 mg/dL — ABNORMAL HIGH (ref 70–99)
Glucose-Capillary: 139 mg/dL — ABNORMAL HIGH (ref 70–99)
Glucose-Capillary: 144 mg/dL — ABNORMAL HIGH (ref 70–99)

## 2021-08-30 LAB — BASIC METABOLIC PANEL
Anion gap: 12 (ref 5–15)
BUN: 24 mg/dL — ABNORMAL HIGH (ref 8–23)
CO2: 31 mmol/L (ref 22–32)
Calcium: 9.1 mg/dL (ref 8.9–10.3)
Chloride: 98 mmol/L (ref 98–111)
Creatinine, Ser: 1.23 mg/dL (ref 0.61–1.24)
GFR, Estimated: >60 mL/min (ref >60)
Glucose, Bld: 105 mg/dL — ABNORMAL HIGH (ref 70–99)
Potassium: 3.7 mmol/L (ref 3.5–5.1)
Sodium: 141 mmol/L (ref 135–145)

## 2021-08-30 LAB — MAGNESIUM: Magnesium: 1.8 mg/dL (ref 1.7–2.4)

## 2021-08-30 MED ORDER — ALLOPURINOL 100 MG PO TABS
100.0000 mg | ORAL_TABLET | Freq: Every day | ORAL | Status: DC
  Administered 2021-08-30 – 2021-09-01 (×3): 100 mg via ORAL
  Filled 2021-08-30 (×3): qty 1

## 2021-08-30 MED ORDER — ALLOPURINOL 100 MG PO TABS
200.0000 mg | ORAL_TABLET | Freq: Every day | ORAL | Status: DC
  Administered 2021-08-30 – 2021-09-02 (×4): 200 mg via ORAL
  Filled 2021-08-30 (×4): qty 2

## 2021-08-30 NOTE — Evaluation (Signed)
Occupational Therapy Evaluation Patient Details Name: Tommy Riley MRN: [id number] DOB: October 11, 1945 Today's Date: 08/30/2021   History of Present Illness 76 y.o. male who presented with dyspnea. Admitted for heart failure management and possible ablation. PMH:  type 2 diabetes mellitus, hyperlipidemia, CHF, CAD s/p CABG, COPD, pacemaker, OSA on CPAP, gout   Clinical Impression   Tommy Riley was evaluated s/p the above admission list, he is generally indep at baseline with intermittent use of SPC when he is out of the house. He lives in  a 1 level home, 2 STE with his wife who is able to assist as needed. Upon evaluation pt was min guard for all functional ambulation and transfers without AD. Pt reaching for external surfaces, and mildly unsteady when cued to not use external support. Overall he is set up - min guard for ADLs as well. He will benefit from OT acutely to progress limitation listed below and receive education on energy conservation for safe transition home. Recommend d/c home without OT follow up.     Recommendations for follow up therapy are one component of a multi-disciplinary discharge planning process, led by the attending physician.  Recommendations may be updated based on patient status, additional functional criteria and insurance authorization.   Follow Up Recommendations  No OT follow up    Assistance Recommended at Discharge Intermittent Supervision/Assistance  Patient can return home with the following A little help with walking and/or transfers;A little help with bathing/dressing/bathroom;Assist for transportation;Help with stairs or ramp for entrance    Functional Status Assessment  Patient has had a recent decline in their functional status and demonstrates the ability to make significant improvements in function in a reasonable and predictable amount of time.  Equipment Recommendations  None recommended by OT    Recommendations for Other Services        Precautions / Restrictions Precautions Precautions: None Precaution Comments: watch SpO2 Restrictions Weight Bearing Restrictions: No      Mobility Bed Mobility Overal bed mobility: Needs Assistance             General bed mobility comments: not tested this session - anticipate supervision/mod I    Transfers Overall transfer level: Needs assistance Equipment used: None Transfers: Sit to/from Stand Sit to Stand: Supervision           General transfer comment: discussed AD for in home/hospital use, pt declined      Balance Overall balance assessment: Needs assistance, History of Falls Sitting-balance support: Feet supported Sitting balance-Leahy Scale: Good     Standing balance support: No upper extremity supported, During functional activity Standing balance-Leahy Scale: Fair Standing balance comment: statically stands at sink to groom                           ADL either performed or assessed with clinical judgement   ADL Overall ADL's : Needs assistance/impaired Eating/Feeding: Independent;Sitting   Grooming: Supervision/safety;Standing Grooming Details (indicate cue type and reason): at the sink Upper Body Bathing: Set up;Sitting   Lower Body Bathing: Min guard;Sit to/from stand   Upper Body Dressing : Set up;Sitting   Lower Body Dressing: Min guard;Sit to/from stand   Toilet Transfer: Min guard;Ambulation Toilet Transfer Details (indicate cue type and reason): no AD Toileting- Clothing Manipulation and Hygiene: Supervision/safety;Sitting/lateral lean       Functional mobility during ADLs: Min guard General ADL Comments: pt recieved in bathroom. no AD used but pt reaching for external support when walking  in the room. incr WOB and poor activity tolerance     Vision Baseline Vision/History: 1 Wears glasses Ability to See in Adequate Light: 0 Adequate Patient Visual Report: No change from baseline Vision Assessment?: No apparent  visual deficits     Perception     Praxis      Pertinent Vitals/Pain Pain Assessment Pain Assessment: No/denies pain     Hand Dominance Right   Extremity/Trunk Assessment Upper Extremity Assessment Upper Extremity Assessment: Overall WFL for tasks assessed   Lower Extremity Assessment Lower Extremity Assessment: Defer to PT evaluation   Cervical / Trunk Assessment Cervical / Trunk Assessment: Other exceptions Cervical / Trunk Exceptions: increased body habitus   Communication Communication Communication: No difficulties   Cognition Arousal/Alertness: Awake/alert Behavior During Therapy: WFL for tasks assessed/performed Overall Cognitive Status: Within Functional Limits for tasks assessed                                       General Comments  VSS on 4L [place]    Exercises     Shoulder Instructions      Home Living Family/patient expects to be discharged to:: Private residence Living Arrangements: Spouse/significant other Available Help at Discharge: Available 24 hours/day;Neighbor Type of Home: House Home Access: Stairs to enter Technical brewer of Steps: 2 Entrance Stairs-Rails: Right;Left Home Layout: One level     Bathroom Shower/Tub: Teacher, early years/pre: Standard     Home Equipment: Tub bench;Grab bars - tub/shower;Hand held Engineering geologist (2 wheels);Cane - single point          Prior Functioning/Environment Prior Level of Function : Driving;Independent/Modified Independent             Mobility Comments: uses SPC outside of the home ADLs Comments: generally indep        OT Problem List: Decreased strength;Decreased range of motion;Decreased activity tolerance;Impaired balance (sitting and/or standing);Decreased safety awareness;Decreased knowledge of precautions      OT Treatment/Interventions: Self-care/ADL training;Therapeutic exercise;DME and/or AE instruction;Therapeutic  activities;Patient/family education;Balance training    OT Goals(Current goals can be found in the care plan section) Acute Rehab OT Goals Patient Stated Goal: home monday OT Goal Formulation: With patient Time For Goal Achievement: 09/13/21 Potential to Achieve Goals: Good ADL Goals Pt Will Transfer to Toilet: Independently;ambulating Pt Will Perform Tub/Shower Transfer: Tub transfer;ambulating;tub bench Additional ADL Goal #1: pt will indep recall at least 3 energy conservation strategies to apply at discharge Additional ADL Goal #2: pt will tolerate at least 5 mintues of OOB funcitonal activity without seated rest break  OT Frequency: Min 2X/week    Co-evaluation              AM-PAC OT "6 Clicks" Daily Activity     Outcome Measure Help from another person eating meals?: None Help from another person taking care of personal grooming?: None Help from another person toileting, which includes using toliet, bedpan, or urinal?: A Little Help from another person bathing (including washing, rinsing, drying)?: A Little Help from another person to put on and taking off regular upper body clothing?: None Help from another person to put on and taking off regular lower body clothing?: A Little 6 Click Score: 21   End of Session Equipment Utilized During Treatment: Oxygen Nurse Communication: Mobility status  Activity Tolerance: Patient tolerated treatment well Patient left: in chair;with call bell/phone within reach  OT Visit Diagnosis:  Unsteadiness on feet (R26.81);Other abnormalities of gait and mobility (R26.89);Repeated falls (R29.6);Muscle weakness (generalized) (M62.81)                Time: 1450-1520 OT Time Calculation (min): 30 min Charges:  OT General Charges $OT Visit: 1 Visit OT Evaluation $OT Eval Low Complexity: 1 Low OT Treatments $Self Care/Home Management : 8-22 mins   Mattew Chriswell A Rosmarie Esquibel 08/30/2021, 4:03 PM

## 2021-08-30 NOTE — Assessment & Plan Note (Addendum)
Hypokalemia  Patient responded well to aggressive diuresis, renal function remained stable. K was corrected with KCL  At the time of his discharge his serum cr was 1.1 with serum bicarbonate at 36.  Will follow on K before his discharge. Continue diuresis with torsemide along with K supplementation.  Follow up renal function and electrolytes as outpatient in 7 days.

## 2021-08-30 NOTE — Assessment & Plan Note (Addendum)
No signs of acute flare, continue with allopurinol.

## 2021-08-30 NOTE — Progress Notes (Signed)
Pt states that he takes Trelegy at home. Can you please order him an inhaled steroid? Thank you.

## 2021-08-30 NOTE — Progress Notes (Signed)
Progress Note   Patient: Tommy Riley [id number]:[id number] DOB: February 11, 1946 DOA: 08/28/2021     2 DOS: the patient was seen and examined on 08/30/2021   Brief hospital course: Tommy Riley was admitted to the hospital with the working diagnosis of decompensated heart failure.   76 yo male with the past medical history of atrial fibrillation/ flutter, heart failure, COPD with chronic hypoxemic respiratory failure, T2DM, dyslipidemia and obesity class 3 who presented with dyspnea. Reported 2 weeks of worsening lower extremity edema, associated with dyspnea and increased abdominal girth. Positive orthopnea. He increased his oxygen flow with no improvement in his symptoms. On his initial physical examination his blood pressure was 132/92, HR 78, RR 30, oxygen saturation 94% and temperature 98,5, lungs with bibasilar rales with no wheezing, heart with S1 and S2 present, no gallops or murmurs, abdomen non distended and positive lower extremity edema.   Na 143, K 3,6, Cl 100, bicarbonate 33, glucose 180, bun 23 and cr 1,25 BNP 200 High sensitive troponin 54 and 57 Wbc 5.4 hgb 13.0 hct 43.2 and plt 192  Sars covid 19 negative   Chest radiograph with cardiomegaly with bilateral lower lobes interstitial infiltrates with small bilateral pleural effusions. (Pacer in place)   EKG 106 bpm, normal axis, left bundle branch block, qtc 526, atrial fibrillation rhythm with q wave V1 and V2 ST depression and  T wave inversion on lead II, III and AvF.   Patient has been placed on diuretic therapy with good response.   Assessment and Plan: * Acute CHF (congestive heart failure) (HCC) Echocardiogram from 04/2021 with LV EF 35 to 40%, with global hypokinesis, mild reduction in RV systolic function, severe elevation in pulmonary systolic pressure up to 66 mmHg. Severe dilatation of right and left atriums.   Urine output over last 24 hrs 1,490 with improvement of his symptoms.  Systolic blood pressure 734 mmHg.    Plan to continue diuresis with IV furosemide, heart failure management with, entresto, dapagliflozin, spironolactone and metoprolol.   Permanent atrial fibrillation ([place])- (present on admission) Uncontrolled chronic atrial fibrillation. Old records personally reviewed office visit to cardiology on 08/21/21.  Plan for AV node ablation on 09/01/21.   Continue rate control with metoprolol, his rate has been 73 to 88.  Continue anticoagulation with apixaban. Likely will have to post pone his ablation until patient stable from his acute heart failure exacerbation.   CKD (chronic kidney disease) stage 2, GFR 60-89 ml/min- (present on admission) Renal function with serum cr at 1,23 around his baseline.  K is 3,7 and serum bicarbonate at 31  Plan to continue diuresis with IV furosemide, follow up on renal function and electrolytes, for now hold on KCL supplementation.   COPD (chronic obstructive pulmonary disease) ([place])- (present on admission) No acute  exacerbation Continue with broncho dilator therapy. Continue oxymetry monitoring.   Type 2 diabetes mellitus with hyperlipidemia ([place])- (present on admission) Patient is tolerating po well, his fasting glucose this am is 105. Plan to continue insulin sliding scale for glucose cover and monitoring.   Gout- (present on admission) No signs of acute flare, will resume home dose of allopurinol.   OSA on CPAP Continue with Cpap.  Obesity- (present on admission) Calculated BMI is 38,1         Subjective: patient is feeling better, decreased dyspnea on exertion and improved lower extremity edema.   Physical Exam: Vitals:   08/30/21 0523 08/30/21 0733 08/30/21 0809 08/30/21 1113  BP:  118/85  108/75  Pulse:  75 88 73  Resp:  20 18 20   Temp:  97.7 F (36.5 C)  98 F (36.7 C)  TempSrc:  Axillary  Oral  SpO2:  98%  98%  Weight: 128.5 kg     Height:       Neurology awake and alert ENT with no pallor Cardiovascular with S1  and S2 present, irregularly irregular with no gallops or murmurs, no rubs No JVD Lower extremity edema ++ bilaterally, compression stockings in place Respiratory with no wheezing, scattered rales and prolonged expiratory phase  Abdomen protuberant but not tender   Data Reviewed:    Family Communication: no family at the bedside   Disposition: Status is: Inpatient Remains inpatient appropriate because: heart failure management      Planned Discharge Destination: Home     Author: Tawni Millers, MD 08/30/2021 11:41 AM  For on call review [email or web address].

## 2021-08-31 DIAGNOSIS — I5041 Acute combined systolic (congestive) and diastolic (congestive) heart failure: Secondary | ICD-10-CM

## 2021-08-31 LAB — GLUCOSE, CAPILLARY
Glucose-Capillary: 97 mg/dL (ref 70–99)
Glucose-Capillary: 154 mg/dL — ABNORMAL HIGH (ref 70–99)
Glucose-Capillary: 161 mg/dL — ABNORMAL HIGH (ref 70–99)
Glucose-Capillary: 184 mg/dL — ABNORMAL HIGH (ref 70–99)

## 2021-08-31 LAB — BASIC METABOLIC PANEL
Anion gap: 9 (ref 5–15)
BUN: 27 mg/dL — ABNORMAL HIGH (ref 8–23)
CO2: 34 mmol/L — ABNORMAL HIGH (ref 22–32)
Calcium: 9.1 mg/dL (ref 8.9–10.3)
Chloride: 99 mmol/L (ref 98–111)
Creatinine, Ser: 1.2 mg/dL (ref 0.61–1.24)
GFR, Estimated: >60 mL/min (ref >60)
Glucose, Bld: 112 mg/dL — ABNORMAL HIGH (ref 70–99)
Potassium: 3.3 mmol/L — ABNORMAL LOW (ref 3.5–5.1)
Sodium: 142 mmol/L (ref 135–145)

## 2021-08-31 LAB — MAGNESIUM: Magnesium: 2 mg/dL (ref 1.7–2.4)

## 2021-08-31 MED ORDER — POTASSIUM CHLORIDE CRYS ER 20 MEQ PO TBCR
40.0000 meq | EXTENDED_RELEASE_TABLET | Freq: Once | ORAL | Status: AC
  Administered 2021-08-31: 40 meq via ORAL
  Filled 2021-08-31: qty 2

## 2021-08-31 MED ORDER — FLUTICASONE FUROATE-VILANTEROL 100-25 MCG/ACT IN AEPB
1.0000 | INHALATION_SPRAY | Freq: Every day | RESPIRATORY_TRACT | Status: DC
  Administered 2021-09-01 – 2021-09-02 (×2): 1 via RESPIRATORY_TRACT
  Filled 2021-08-31: qty 28

## 2021-08-31 NOTE — Evaluation (Signed)
Physical Therapy Evaluation Patient Details Name: Tommy Riley MRN: [id number] DOB: [birth year] Today's Date: 08/31/2021  History of Present Illness  76 y.o. male who presented with dyspnea. Admitted for heart failure management and possible ablation. PMH:  type 2 diabetes mellitus, hyperlipidemia, CHF, CAD s/p CABG, COPD, pacemaker, OSA on CPAP, gout  Clinical Impression  PTA, pt lives with his spouse and is a household ambulator. Pt reports goal is to be able to return to walking his dog. Pt presents with decreased cardiopulmonary endurance. Ambulating 100 feet with a Rollator at a min guard assist level. SpO2 96% on 4L O2, HR peak 121 bpm. Education provided regarding activity recommendations and progression. Will continue to follow acutely.     Recommendations for follow up therapy are one component of a multi-disciplinary discharge planning process, led by the attending physician.  Recommendations may be updated based on patient status, additional functional criteria and insurance authorization.  Follow Up Recommendations No PT follow up    Assistance Recommended at Discharge Set up Supervision/Assistance  Patient can return home with the following  Help with stairs or ramp for entrance;A little help with walking and/or transfers;A little help with bathing/dressing/bathroom    Equipment Recommendations None recommended by PT (pt declining Rollator)  Recommendations for Other Services       Functional Status Assessment Patient has had a recent decline in their functional status and demonstrates the ability to make significant improvements in function in a reasonable and predictable amount of time.     Precautions / Restrictions Precautions Precautions: None Precaution Comments: watch SpO2 (on 4L baseline) Restrictions Weight Bearing Restrictions: No      Mobility  Bed Mobility               General bed mobility comments: Sitting EOB upon entry     Transfers Overall transfer level: Needs assistance Equipment used: Rollator (4 wheels) Transfers: Sit to/from Stand Sit to Stand: Supervision                Ambulation/Gait Ambulation/Gait assistance: Min guard Gait Distance (Feet): 120 Feet Assistive device: Rollator (4 wheels) Gait Pattern/deviations: Step-through pattern, Decreased stride length, Trunk flexed       General Gait Details: Cues for Rollator use i.e. locking braces prior to sitting/standing and proximity. Also  needs reminders for activity pacing  Stairs            Wheelchair Mobility    Modified Rankin (Stroke Patients Only)       Balance Overall balance assessment: Needs assistance, History of Falls Sitting-balance support: Feet supported Sitting balance-Leahy Scale: Good     Standing balance support: No upper extremity supported, During functional activity Standing balance-Leahy Scale: Fair                               Pertinent Vitals/Pain Pain Assessment Pain Assessment: No/denies pain    Home Living Family/patient expects to be discharged to:: Private residence Living Arrangements: Spouse/significant other Available Help at Discharge: Available 24 hours/day;Neighbor Type of Home: House Home Access: Stairs to enter Entrance Stairs-Rails: Psychiatric nurse of Steps: 2   Home Layout: One level Home Equipment: Tub bench;Grab bars - tub/shower;Hand held shower head;Rolling Walker (2 wheels);Cane - single point      Prior Function Prior Level of Function : Driving;Independent/Modified Independent             Mobility Comments: uses SPC outside of the home ADLs  Comments: generally indep     Hand Dominance   Dominant Hand: Right    Extremity/Trunk Assessment   Upper Extremity Assessment Upper Extremity Assessment: Defer to OT evaluation    Lower Extremity Assessment Lower Extremity Assessment: LLE deficits/detail;RLE  deficits/detail RLE Deficits / Details: Grossly 4/5, increased edema LLE Deficits / Details: Grossly 4/5, increased edema    Cervical / Trunk Assessment Cervical / Trunk Assessment: Other exceptions Cervical / Trunk Exceptions: increased body habitus  Communication   Communication: No difficulties  Cognition Arousal/Alertness: Awake/alert Behavior During Therapy: WFL for tasks assessed/performed Overall Cognitive Status: Within Functional Limits for tasks assessed                                          General Comments      Exercises     Assessment/Plan    PT Assessment Patient needs continued PT services  PT Problem List Decreased strength;Decreased activity tolerance;Decreased mobility;Decreased balance       PT Treatment Interventions DME instruction;Gait training;Stair training;Functional mobility training;Therapeutic activities;Therapeutic exercise;Balance training;Patient/family education    PT Goals (Current goals can be found in the Care Plan section)  Acute Rehab PT Goals Patient Stated Goal: to be able to walk his dog PT Goal Formulation: With patient Time For Goal Achievement: 09/14/21 Potential to Achieve Goals: Good    Frequency Min 3X/week     Co-evaluation               AM-PAC PT "6 Clicks" Mobility  Outcome Measure Help needed turning from your back to your side while in a flat bed without using bedrails?: None Help needed moving from lying on your back to sitting on the side of a flat bed without using bedrails?: None Help needed moving to and from a bed to a chair (including a wheelchair)?: A Little Help needed standing up from a chair using your arms (e.g., wheelchair or bedside chair)?: A Little Help needed to walk in hospital room?: A Little Help needed climbing 3-5 steps with a railing? : A Little 6 Click Score: 20    End of Session Equipment Utilized During Treatment: Oxygen Activity Tolerance: Patient tolerated  treatment well Patient left: in bed;with call bell/phone within reach Nurse Communication: Mobility status PT Visit Diagnosis: Unsteadiness on feet (R26.81);Difficulty in walking, not elsewhere classified (R26.2)    Time: 8921-1941 PT Time Calculation (min) (ACUTE ONLY): 28 min   Charges:   PT Evaluation $PT Eval Low Complexity: 1 Low PT Treatments $Gait Training: 8-22 mins        Wyona Almas, PT, DPT Acute Rehabilitation Services Pager [phone] Office [phone]   Deno Etienne 08/31/2021, 11:32 AM

## 2021-08-31 NOTE — Progress Notes (Signed)
Progress Note   Patient: Tommy Riley JOA:[id number] DOB: 04-28-1946 DOA: 08/28/2021     3 DOS: the patient was seen and examined on 08/31/2021   Brief hospital course: Mr. Waage was admitted to the hospital with the working diagnosis of decompensated heart failure.   76 yo male with the past medical history of atrial fibrillation/ flutter, heart failure, COPD with chronic hypoxemic respiratory failure, T2DM, dyslipidemia and obesity class 3 who presented with dyspnea. Reported 2 weeks of worsening lower extremity edema, associated with dyspnea and increased abdominal girth. Positive orthopnea. He increased his oxygen flow with no improvement in his symptoms. On his initial physical examination his blood pressure was 132/92, HR 78, RR 30, oxygen saturation 94% and temperature 98,5, lungs with bibasilar rales with no wheezing, heart with S1 and S2 present, no gallops or murmurs, abdomen non distended and positive lower extremity edema.   Na 143, K 3,6, Cl 100, bicarbonate 33, glucose 180, bun 23 and cr 1,25 BNP 200 High sensitive troponin 54 and 57 Wbc 5.4 hgb 13.0 hct 43.2 and plt 192  Sars covid 19 negative   Chest radiograph with cardiomegaly with bilateral lower lobes interstitial infiltrates with small bilateral pleural effusions. (Pacer in place)   EKG 106 bpm, normal axis, left bundle branch block, qtc 526, atrial fibrillation rhythm with q wave V1 and V2 ST depression and  T wave inversion on lead II, III and AvF.   Patient has been placed on diuretic therapy with good response.   Plan for AV ablation tomorrow per cardiology recommendations.   Assessment and Plan: * Acute CHF (congestive heart failure) (HCC) Echocardiogram from 04/2021 with LV EF 35 to 40%, with global hypokinesis, mild reduction in RV systolic function, severe elevation in pulmonary systolic pressure up to 66 mmHg. Severe dilatation of right and left atriums.   Urine output over last 24 hrs 1,900  with  improvement of his symptoms, he feels 50% from his baseline.  Systolic blood pressure 601 to 115 mmHg.   Tolerating well diuresis with IV furosemide 60 mg IV q12 hrs.  Continue with entresto, dapagliflozin, spironolactone and metoprolol.   Permanent atrial fibrillation ([place])- (present on admission) Uncontrolled chronic atrial fibrillation. Old records personally reviewed office visit to cardiology on 08/21/21.  Plan for AV node ablation on 09/01/21.   Continue rate control with metoprolol Plan to proceed with AV node ablation tomorrow. Holding anticoagulation today.  Continue telemetry monitoring and diuresis with furosemide.    CKD (chronic kidney disease) stage 2, GFR 60-89 ml/min- (present on admission) Hypokalemia  Today his renal function has a serum cr of 1,20 with K at 3,3, bicarbonate at 34. Plan to continue diuresis with furosemide and follow up renal function and electrolytes in am, including mg.    COPD (chronic obstructive pulmonary disease) ([place])- (present on admission) No acute  exacerbation Continue with broncho dilator therapy. Continue oxymetry monitoring.   Type 2 diabetes mellitus with hyperlipidemia ([place])- (present on admission) Patient is tolerating po well, his fasting glucose this am is 112 mg.dl Capillary glucose has been 144, 97 and 154. Basal insulin 25 units with detemir Continue insulin sliding scale for glucose cover and monitoring Continue with statin therapy.   Gout- (present on admission) No signs of acute flare, will resume home dose of allopurinol.   OSA on CPAP Continue with Cpap.  Obesity- (present on admission) Calculated BMI is 38,1         Subjective: Patient with improvement in dyspnea, edema and  mobility, not  yet back to his baseline, no chest pain   Physical Exam: Vitals:   08/31/21 0452 08/31/21 0912 08/31/21 0917 08/31/21 1102  BP: 111/73  101/67 115/74  Pulse:  89 92 61  Resp: 20 18 18 18   Temp: (!) 97.3 F (36.3  C)  97.8 F (36.6 C) 97.8 F (36.6 C)  TempSrc:   Oral Oral  SpO2: 100%  99% 96%  Weight: 128.2 kg     Height:       Neurology awake and alert. Deconditioned ENT with no pallor Cardiovascular with S1 and S2 present irregularly irregular with systolic murmur at the apex, with no rubs No JVD Positive lower extremity edema +/++ positive compression stockings bilaterally  Respiratory with no wheezing, scattered rales at basese. Abdomen protuberant but not distended   Data Reviewed:    Family Communication: no family at the bedside   Disposition: Status is: Inpatient Remains inpatient appropriate because: heart failure and need for AV ablation      Planned Discharge Destination: Home   Author: Tawni Millers, MD 08/31/2021 2:41 PM  For on call review [email or web address].

## 2021-08-31 NOTE — Progress Notes (Signed)
Mobility Specialist Criteria Algorithm Info.   08/31/21 1530  Oxygen Therapy  SpO2 95 %  O2 Device Nasal Cannula  Patient Activity (if Appropriate) Ambulating  Pulse Oximetry Type Intermittent  Mobility  Activity Ambulated with assistance in hallway;Dangled on edge of bed  Range of Motion/Exercises Active;All extremities  Level of Assistance Standby assist, set-up cues, supervision of patient - no hands on  Assistive Device Cane  Distance Ambulated (ft) 100 ft  Activity Response Tolerated well   HR pre: 98 HR during: 120 HR post: 108  Patient received lying in bed eager to participate in mobility. Ambulated in hallway supervision level with steady gait. Required extended standing rest recovery x1 secondary to SOB. Oxygen saturated 95% throughout on 4LO2. Returned to room without incident. Was left dangling EOB with all needs met, call bell in reach.  08/31/2021 4:34 PM  Tommy Riley, [place], [place]  QGBEE:[phone] Office: [phone]

## 2021-08-31 NOTE — Progress Notes (Signed)
Progress Note  Patient Name: Tommy Riley Date of Encounter: 08/31/2021  Primary Cardiologist: Rozann Lesches, MD   Subjective   Denies chest pain. Dyspnea improved.   Inpatient Medications    Scheduled Meds:  diphenhydrAMINE  50 mg Oral QHS   And   acetaminophen  1,000 mg Oral QHS   albuterol  2.5 mg Nebulization TID   allopurinol  100 mg Oral QHS   allopurinol  200 mg Oral Daily   vitamin C  500 mg Oral BID   aspirin EC  81 mg Oral Daily   cholecalciferol  2,000 Units Oral Daily   dapagliflozin propanediol  10 mg Oral Daily   ezetimibe  10 mg Oral Daily   fluorouracil  1 application Topical BID   folic acid  086 mcg Oral QPM   furosemide  60 mg Intravenous BID   insulin aspart  0-20 Units Subcutaneous TID AC & HS   insulin detemir  25 Units Subcutaneous Q2200   loratadine  10 mg Oral Daily   melatonin  5 mg Oral QHS   metoprolol succinate  200 mg Oral Daily   multivitamin with minerals  1 tablet Oral Daily   omega-3 acid ethyl esters  2 g Oral BID   rosuvastatin  40 mg Oral Daily   sacubitril-valsartan  1 tablet Oral BID   sertraline  150 mg Oral Daily   spironolactone  25 mg Oral Daily   zinc sulfate  220 mg Oral QPM   Continuous Infusions:  PRN Meds: acetaminophen **OR** acetaminophen, albuterol, magnesium hydroxide, ondansetron **OR** ondansetron (ZOFRAN) IV, traZODone   Vital Signs    Vitals:   08/31/21 0452 08/31/21 0912 08/31/21 0917 08/31/21 1102  BP: 111/73  101/67 115/74  Pulse:  89 92 61  Resp: 20 18 18 18   Temp: (!) 97.3 F (36.3 C)  97.8 F (36.6 C) 97.8 F (36.6 C)  TempSrc:   Oral Oral  SpO2: 100%  99% 96%  Weight: 128.2 kg     Height:        Intake/Output Summary (Last 24 hours) at 08/31/2021 1302 Last data filed at 08/31/2021 1137 Gross per 24 hour  Intake 600 ml  Output 1950 ml  Net -1350 ml   Filed Weights   08/29/21 1247 08/30/21 0523 08/31/21 0452  Weight: 127.7 kg 128.5 kg 128.2 kg    Telemetry    Atrial fib  with a CVR/RVR - Personally Reviewed  ECG    none - Personally Reviewed  Physical Exam   GEN: No acute distress.   Neck: No JVD Cardiac: IRIRR, no murmurs, rubs, or gallops.  Respiratory: Clear to auscultation bilaterally. GI: Soft, nontender, non-distended  MS: No edema; No deformity. Neuro:  Nonfocal  Psych: Normal affect   Labs    Chemistry Recent Labs  Lab 08/29/21 0439 08/30/21 0232 08/31/21 0251  NA 144 141 142  K 4.1 3.7 3.3*  CL 104 98 99  CO2 28 31 34*  GLUCOSE 116* 105* 112*  BUN 22 24* 27*  CREATININE 1.22 1.23 1.20  CALCIUM 9.2 9.1 9.1  GFRNONAA >60 >60 >60  ANIONGAP 12 12 9      Hematology Recent Labs  Lab 08/28/21 1821 08/29/21 0439  WBC 5.4 5.0  RBC 4.74 5.12  HGB 13.0 14.1  HCT 43.2 47.2  MCV 91.1 92.2  MCH 27.4 27.5  MCHC 30.1 29.9*  RDW 17.0* 17.0*  PLT 192 120*    Cardiac EnzymesNo results for input(s): TROPONINI in  the last 168 hours. No results for input(s): TROPIPOC in the last 168 hours.   BNP Recent Labs  Lab 08/26/21 1239 08/28/21 1821  BNP  --  200.0*  PROBNP 353.0*  --      DDimer No results for input(s): DDIMER in the last 168 hours.   Radiology    No results found.  Cardiac Studies   none  Patient Profile     76 y.o. male admitted with worsening CHF and uncontrolled atrial fib.  Assessment & Plan    Uncontrolled atrial fib - he will undergo AV node ablation tomorrow. I would anticipate DC home on Tuesday. Acute on chronic systolic heart failure - he is better though still class 3A. Hopefully this will improve once we have his rate controlled and he is biv pacing. Obesity - I told him he would have to lose weight after he is medically optimized.      For questions or updates, please contact [place] Please consult [email or web address] for contact info under Cardiology/STEMI.      Signed, Cristopher Peru, MD  08/31/2021, 1:02 PM

## 2021-08-31 NOTE — Progress Notes (Signed)
Home CPAP set up at bedside. Distilled water added to chamber. 4L O2 bleed in. Patient able to place self on/off as needed.

## 2021-09-01 ENCOUNTER — Ambulatory Visit (HOSPITAL_COMMUNITY): Admission: RE | Admit: 2021-09-01 | Payer: Medicare Other | Source: Home / Self Care | Admitting: Internal Medicine

## 2021-09-01 ENCOUNTER — Encounter (HOSPITAL_COMMUNITY)
Admission: EM | Disposition: A | Discharge status: Home / Self Care | Payer: Medicare Other | Source: Home / Self Care | Attending: Internal Medicine

## 2021-09-01 ENCOUNTER — Encounter (HOSPITAL_COMMUNITY): Payer: Self-pay | Admitting: Internal Medicine

## 2021-09-01 HISTORY — PX: AV NODE ABLATION: EP1193

## 2021-09-01 LAB — BASIC METABOLIC PANEL
Anion gap: 9 (ref 5–15)
BUN: 23 mg/dL (ref 8–23)
CO2: 34 mmol/L — ABNORMAL HIGH (ref 22–32)
Calcium: 9 mg/dL (ref 8.9–10.3)
Chloride: 100 mmol/L (ref 98–111)
Creatinine, Ser: 1.24 mg/dL (ref 0.61–1.24)
GFR, Estimated: >60 mL/min (ref >60)
Glucose, Bld: 100 mg/dL — ABNORMAL HIGH (ref 70–99)
Potassium: 4.4 mmol/L (ref 3.5–5.1)
Sodium: 143 mmol/L (ref 135–145)

## 2021-09-01 LAB — GLUCOSE, CAPILLARY
Glucose-Capillary: 95 mg/dL (ref 70–99)
Glucose-Capillary: 98 mg/dL (ref 70–99)
Glucose-Capillary: 124 mg/dL — ABNORMAL HIGH (ref 70–99)
Glucose-Capillary: 218 mg/dL — ABNORMAL HIGH (ref 70–99)

## 2021-09-01 LAB — MAGNESIUM: Magnesium: 2.2 mg/dL (ref 1.7–2.4)

## 2021-09-01 SURGERY — AV NODE ABLATION

## 2021-09-01 MED ORDER — HEPARIN (PORCINE) IN NACL 1000-0.9 UT/500ML-% IV SOLN
INTRAVENOUS | Status: AC
  Filled 2021-09-01: qty 500

## 2021-09-01 MED ORDER — HEPARIN (PORCINE) IN NACL 1000-0.9 UT/500ML-% IV SOLN
INTRAVENOUS | Status: DC | PRN
  Administered 2021-09-01: 500 mL

## 2021-09-01 MED ORDER — SODIUM CHLORIDE 0.9 % IV SOLN: 250.0000 mL | INTRAVENOUS | Status: DC | PRN

## 2021-09-01 MED ORDER — ACETAMINOPHEN 325 MG PO TABS: 650.0000 mg | ORAL_TABLET | ORAL | Status: DC | PRN

## 2021-09-01 MED ORDER — ONDANSETRON HCL 4 MG/2ML IJ SOLN: 4.0000 mg | Freq: Four times a day (QID) | INTRAMUSCULAR | Status: DC | PRN

## 2021-09-01 MED ORDER — BUPIVACAINE HCL (PF) 0.25 % IJ SOLN
INTRAMUSCULAR | Status: DC | PRN
  Administered 2021-09-01: 30 mL

## 2021-09-01 MED ORDER — DAPAGLIFLOZIN PROPANEDIOL 10 MG PO TABS
10.0000 mg | ORAL_TABLET | Freq: Every day | ORAL | Status: DC
  Administered 2021-09-02: 10 mg via ORAL
  Filled 2021-09-01: qty 1

## 2021-09-01 MED ORDER — SODIUM CHLORIDE 0.9 % IV SOLN: INTRAVENOUS | Status: DC

## 2021-09-01 MED ORDER — BUPIVACAINE HCL (PF) 0.25 % IJ SOLN
INTRAMUSCULAR | Status: AC
  Filled 2021-09-01: qty 30

## 2021-09-01 MED ORDER — SODIUM CHLORIDE 0.9% FLUSH: 3.0000 mL | INTRAVENOUS | Status: DC | PRN

## 2021-09-01 MED ORDER — SODIUM CHLORIDE 0.9% FLUSH
3.0000 mL | Freq: Two times a day (BID) | INTRAVENOUS | Status: DC
  Administered 2021-09-01 – 2021-09-02 (×3): 3 mL via INTRAVENOUS

## 2021-09-01 SURGICAL SUPPLY — 6 items
CATH CELSIUS THERMO F CV 7FR (ABLATOR) ×1 IMPLANT
PACK EP LATEX FREE (CUSTOM PROCEDURE TRAY) ×1
PACK EP LF (CUSTOM PROCEDURE TRAY) ×1 IMPLANT
PAD DEFIB RADIO PHYSIO CONN (PAD) ×2 IMPLANT
SHEATH PINNACLE 8F 10CM (SHEATH) ×1 IMPLANT
SHEATH PROBE COVER 6X72 (BAG) ×1 IMPLANT

## 2021-09-01 NOTE — Care Management Important Message (Signed)
Important Message  Patient Details  Name: Tommy Riley MRN: [id number] Date of Birth: 13-May-1946   Medicare Important Message Given:  Yes     Shelda Altes 09/01/2021, 9:30 AM

## 2021-09-01 NOTE — Discharge Instructions (Signed)
Post procedure care instructions No driving for 4 days. No lifting over 5 lbs for 1 week. No vigorous or sexual activity for 1 week. You may return to work/your usual activities on 09/09/21. Keep procedure site clean & dry. If you notice increased pain, swelling, bleeding or pus, call/return!  You may shower after 24 hours, but no soaking in baths/hot tubs/pools for 1 week.

## 2021-09-01 NOTE — Interval H&P Note (Signed)
History and Physical Interval Note:  09/01/2021 9:47 AM  Tommy Riley  has presented today for surgery, with the diagnosis of av node.  The various methods of treatment have been discussed with the patient and family. After consideration of risks, benefits and other options for treatment, the patient has consented to  Procedure(s): AV NODE ABLATION (N/A) as a surgical intervention.  The patient's history has been reviewed, patient examined, no change in status, stable for surgery.  I have reviewed the patient's chart and labs.  Questions were answered to the patient's satisfaction.     Cristopher Peru

## 2021-09-01 NOTE — H&P (View-Only) (Signed)
Progress Note  Patient Name: Tommy Riley Date of Encounter: 09/01/2021  [facility] Cardiologist: Rozann Lesches, MD   Subjective   SOB having today, at home walking from the living room to the [facility] will make him SOB, he is not back to baseline but better then he felt on admission, no CP  Inpatient Medications    Scheduled Meds:  diphenhydrAMINE  50 mg Oral QHS   And   acetaminophen  1,000 mg Oral QHS   albuterol  2.5 mg Nebulization TID   allopurinol  100 mg Oral QHS   allopurinol  200 mg Oral Daily   vitamin C  500 mg Oral BID   aspirin EC  81 mg Oral Daily   cholecalciferol  2,000 Units Oral Daily   dapagliflozin propanediol  10 mg Oral Daily   ezetimibe  10 mg Oral Daily   fluticasone furoate-vilanterol  1 puff Inhalation Daily   folic acid  564 mcg Oral QPM   furosemide  60 mg Intravenous BID   insulin aspart  0-20 Units Subcutaneous TID AC & HS   insulin detemir  25 Units Subcutaneous Q2200   loratadine  10 mg Oral Daily   melatonin  5 mg Oral QHS   metoprolol succinate  200 mg Oral Daily   multivitamin with minerals  1 tablet Oral Daily   omega-3 acid ethyl esters  2 g Oral BID   rosuvastatin  40 mg Oral Daily   sacubitril-valsartan  1 tablet Oral BID   sertraline  150 mg Oral Daily   spironolactone  25 mg Oral Daily   zinc sulfate  220 mg Oral QPM   Continuous Infusions:  sodium chloride 50 mL/hr at 09/01/21 0600   PRN Meds: acetaminophen **OR** acetaminophen, albuterol, magnesium hydroxide, ondansetron **OR** ondansetron (ZOFRAN) IV, traZODone   Vital Signs    Vitals:   08/31/21 1530 08/31/21 1950 08/31/21 2004 09/01/21 0304  BP:  118/67  124/78  Pulse:  94  68  Resp:  18  18  Temp:  98 F (36.7 C)  (!) 97.4 F (36.3 C)  TempSrc:  Oral  Oral  SpO2: 95% 99% 98% 100%  Weight:    129.3 kg  Height:        Intake/Output Summary (Last 24 hours) at 09/01/2021 0824 Last data filed at 09/01/2021 0818 Gross per 24 hour  Intake 910 ml  Output  2225 ml  Net -1315 ml   Last 3 Weights 09/01/2021 08/31/2021 08/30/2021  Weight (lbs) 285 lb 282 lb 11.2 oz 283 lb 4.7 oz  Weight (kg) 129.275 kg 128.232 kg 128.5 kg      Telemetry    Afib, intermittent V pacing - Personally Reviewed  ECG    No new EKGs - Personally Reviewed  Physical Exam   GEN: No acute distress.   Neck: No JVD Cardiac: irreg-irreg, 1-2/6 SM, rubs, or gallops.  Respiratory: soft crackles, ronchi b/l, R?L, moving air, not wheezing. GI: Soft, nontender, non-distended  MS: 1-2+ edema; cpmpression stocking on,UE also look swollen, not pitting, no deformity. Neuro:  Nonfocal  Psych: Normal affect   Labs    High Sensitivity Troponin:   Recent Labs  Lab 08/28/21 1821 08/28/21 2047  TROPONINIHS 54* 57*     Chemistry Recent Labs  Lab 08/29/21 0439 08/30/21 0232 08/31/21 0251  NA 144 141 142  K 4.1 3.7 3.3*  CL 104 98 99  CO2 28 31 34*  GLUCOSE 116* 105* 112*  BUN 22  24* 27*  CREATININE 1.22 1.23 1.20  CALCIUM 9.2 9.1 9.1  MG  --  1.8 2.0  GFRNONAA >60 >60 >60  ANIONGAP 12 12 9     Lipids No results for input(s): CHOL, TRIG, HDL, LABVLDL, LDLCALC, CHOLHDL in the last 168 hours.  Hematology Recent Labs  Lab 08/28/21 1821 08/29/21 0439  WBC 5.4 5.0  RBC 4.74 5.12  HGB 13.0 14.1  HCT 43.2 47.2  MCV 91.1 92.2  MCH 27.4 27.5  MCHC 30.1 29.9*  RDW 17.0* 17.0*  PLT 192 120*   Thyroid No results for input(s): TSH, FREET4 in the last 168 hours.  BNP Recent Labs  Lab 08/26/21 1239 08/28/21 1821  BNP  --  200.0*  PROBNP 353.0*  --     DDimer No results for input(s): DDIMER in the last 168 hours.   Radiology    No results found.  Cardiac Studies   04/14/21: TTE 1. Left ventricular ejection fraction, by estimation, is 35 to 40%. The  left ventricle has moderately decreased function. The left ventricle  demonstrates global hypokinesis. There is mild left ventricular  hypertrophy. Left ventricular diastolic  parameters are  indeterminate. In Afib with RVR during study, consider  repeat limited echo to better evaluate systolic function once rates better  controlled.   2. Right ventricular systolic function is mildly reduced. The right  ventricular size is normal. There is severely elevated pulmonary artery  systolic pressure. The estimated right ventricular systolic pressure is  62.9 mmHg.   3. Left atrial size was severely dilated.   4. Right atrial size was severely dilated.   5. The mitral valve is normal in structure. Trivial mitral valve  regurgitation.   6. The aortic valve is tricuspid. Aortic valve regurgitation is not  visualized. No aortic stenosis is present.   7. The inferior vena cava is dilated in size with <50% respiratory  variability, suggesting right atrial pressure of 15 mmHg.   05/05/21: R/LHC   Ost RCA to Prox RCA lesion is 100% stenosed.   2nd Mrg lesion is 100% stenosed.   Mid LAD lesion is 95% stenosed.   There is moderate left ventricular systolic dysfunction.   LV end diastolic pressure is moderately elevated.   The left ventricular ejection fraction is 35-45% by visual estimate. HEMODYNAMICS:    1: Right atrial pressure-15/17 2: Right ventricular pressure-68/7 3: Pulmonary artery [id number] systolic, mean 40 4: [id number] 5: Cardiac output-4.9 L/min with an index of 2 L/min/m by Fick   IMPRESSION: Mr. [name] has patent grafts including a vein to the PDA and a Y graft originating from the internal mammary artery to the distal LAD and distal circumflex marginal branch.  He does have pulmonary hypertension.  His right common femoral vein was closed with a Mynx closure device.  The right common femoral artery will be manually pulled and held.  The anatomy has been reviewed with Dr. Lovena Le who is anticipating biventricular pacing (CRT).  The patient left lab in stable condition.  Patient Profile     76 y.o. male w/PMHx of chronic CHF (systolic), CAD (remote CABG, cath 04/2021  with patent grafts), AFib, HTN, HLD , stroke, OSA (w/CPAP), DM, LBBB, Obesity, COPD (chronic home O2)  Last seen by Dr. Lovena Le planned for AV node ablation with continued AFib fast rates despite nodal blocking agents in need for better CRT/HR optimization  Admitted to [facility] 08/28/21 with progressive SOB, edema and increasing home O2 needs found volume OL and IV diuretics started  Device information BSCi CRT-D implanted 05/07/21 A lead is LB pacing RV/ICD lead is RV apex CS lead in the CS/lateral wall  Assessment & Plan    Longstanding persistent ?permanent AFib CHA2DS2Vasc is 8, on Eliquis (none yesterday) Planned for AV node ablation today (scheduled outpt) No plans for DCCV  2. Acute/chronic CHF (systolic) Cumulatively negative -3567ml   The patient reports that while he has baseline SOB, uses O2 at home, he is feeling better but not yet back to his "normal" SOB, but he feels ready for his procedure Would continue IV lasix for now BMET stable  3.  CAD No anginal complaints  For questions or updates, please contact [place] Please consult [email or web address] for contact info under   Signed, Baldwin Jamaica, PA-C  09/01/2021, 8:24 AM    EP Attending  Patient seen and examined. Agree with above. See my note from yesterday as well. He is stable for AV node ablation. I have reviewed the indications/risks/benefits/goals/expectations and he wishes to proceed.  Carleene Overlie Alveta Quintela,MD

## 2021-09-01 NOTE — Progress Notes (Addendum)
Progress Note  Patient Name: Tommy Riley Date of Encounter: 09/01/2021  [facility] Cardiologist: Rozann Lesches, MD   Subjective   SOB having today, at home walking from the living room to the [facility] will make him SOB, he is not back to baseline but better then he felt on admission, no CP  Inpatient Medications    Scheduled Meds:  diphenhydrAMINE  50 mg Oral QHS   And   acetaminophen  1,000 mg Oral QHS   albuterol  2.5 mg Nebulization TID   allopurinol  100 mg Oral QHS   allopurinol  200 mg Oral Daily   vitamin C  500 mg Oral BID   aspirin EC  81 mg Oral Daily   cholecalciferol  2,000 Units Oral Daily   dapagliflozin propanediol  10 mg Oral Daily   ezetimibe  10 mg Oral Daily   fluticasone furoate-vilanterol  1 puff Inhalation Daily   folic acid  324 mcg Oral QPM   furosemide  60 mg Intravenous BID   insulin aspart  0-20 Units Subcutaneous TID AC & HS   insulin detemir  25 Units Subcutaneous Q2200   loratadine  10 mg Oral Daily   melatonin  5 mg Oral QHS   metoprolol succinate  200 mg Oral Daily   multivitamin with minerals  1 tablet Oral Daily   omega-3 acid ethyl esters  2 g Oral BID   rosuvastatin  40 mg Oral Daily   sacubitril-valsartan  1 tablet Oral BID   sertraline  150 mg Oral Daily   spironolactone  25 mg Oral Daily   zinc sulfate  220 mg Oral QPM   Continuous Infusions:  sodium chloride 50 mL/hr at 09/01/21 0600   PRN Meds: acetaminophen **OR** acetaminophen, albuterol, magnesium hydroxide, ondansetron **OR** ondansetron (ZOFRAN) IV, traZODone   Vital Signs    Vitals:   08/31/21 1530 08/31/21 1950 08/31/21 2004 09/01/21 0304  BP:  118/67  124/78  Pulse:  94  68  Resp:  18  18  Temp:  98 F (36.7 C)  (!) 97.4 F (36.3 C)  TempSrc:  Oral  Oral  SpO2: 95% 99% 98% 100%  Weight:    129.3 kg  Height:        Intake/Output Summary (Last 24 hours) at 09/01/2021 0824 Last data filed at 09/01/2021 0818 Gross per 24 hour  Intake 910 ml  Output  2225 ml  Net -1315 ml   Last 3 Weights 09/01/2021 08/31/2021 08/30/2021  Weight (lbs) 285 lb 282 lb 11.2 oz 283 lb 4.7 oz  Weight (kg) 129.275 kg 128.232 kg 128.5 kg      Telemetry    Afib, intermittent V pacing - Personally Reviewed  ECG    No new EKGs - Personally Reviewed  Physical Exam   GEN: No acute distress.   Neck: No JVD Cardiac: irreg-irreg, 1-2/6 SM, rubs, or gallops.  Respiratory: soft crackles, ronchi b/l, R?L, moving air, not wheezing. GI: Soft, nontender, non-distended  MS: 1-2+ edema; cpmpression stocking on,UE also look swollen, not pitting, no deformity. Neuro:  Nonfocal  Psych: Normal affect   Labs    High Sensitivity Troponin:   Recent Labs  Lab 08/28/21 1821 08/28/21 2047  TROPONINIHS 54* 57*     Chemistry Recent Labs  Lab 08/29/21 0439 08/30/21 0232 08/31/21 0251  NA 144 141 142  K 4.1 3.7 3.3*  CL 104 98 99  CO2 28 31 34*  GLUCOSE 116* 105* 112*  BUN 22  24* 27*  CREATININE 1.22 1.23 1.20  CALCIUM 9.2 9.1 9.1  MG  --  1.8 2.0  GFRNONAA >60 >60 >60  ANIONGAP 12 12 9     Lipids No results for input(s): CHOL, TRIG, HDL, LABVLDL, LDLCALC, CHOLHDL in the last 168 hours.  Hematology Recent Labs  Lab 08/28/21 1821 08/29/21 0439  WBC 5.4 5.0  RBC 4.74 5.12  HGB 13.0 14.1  HCT 43.2 47.2  MCV 91.1 92.2  MCH 27.4 27.5  MCHC 30.1 29.9*  RDW 17.0* 17.0*  PLT 192 120*   Thyroid No results for input(s): TSH, FREET4 in the last 168 hours.  BNP Recent Labs  Lab 08/26/21 1239 08/28/21 1821  BNP  --  200.0*  PROBNP 353.0*  --     DDimer No results for input(s): DDIMER in the last 168 hours.   Radiology    No results found.  Cardiac Studies   04/14/21: TTE 1. Left ventricular ejection fraction, by estimation, is 35 to 40%. The  left ventricle has moderately decreased function. The left ventricle  demonstrates global hypokinesis. There is mild left ventricular  hypertrophy. Left ventricular diastolic  parameters are  indeterminate. In Afib with RVR during study, consider  repeat limited echo to better evaluate systolic function once rates better  controlled.   2. Right ventricular systolic function is mildly reduced. The right  ventricular size is normal. There is severely elevated pulmonary artery  systolic pressure. The estimated right ventricular systolic pressure is  61.4 mmHg.   3. Left atrial size was severely dilated.   4. Right atrial size was severely dilated.   5. The mitral valve is normal in structure. Trivial mitral valve  regurgitation.   6. The aortic valve is tricuspid. Aortic valve regurgitation is not  visualized. No aortic stenosis is present.   7. The inferior vena cava is dilated in size with <50% respiratory  variability, suggesting right atrial pressure of 15 mmHg.   05/05/21: R/LHC   Ost RCA to Prox RCA lesion is 100% stenosed.   2nd Mrg lesion is 100% stenosed.   Mid LAD lesion is 95% stenosed.   There is moderate left ventricular systolic dysfunction.   LV end diastolic pressure is moderately elevated.   The left ventricular ejection fraction is 35-45% by visual estimate. HEMODYNAMICS:    1: Right atrial pressure-15/17 2: Right ventricular pressure-68/7 3: Pulmonary artery [id number] systolic, mean 40 4: [id number] 5: Cardiac output-4.9 L/min with an index of 2 L/min/m by Fick   IMPRESSION: Mr. [name] has patent grafts including a vein to the PDA and a Y graft originating from the internal mammary artery to the distal LAD and distal circumflex marginal branch.  He does have pulmonary hypertension.  His right common femoral vein was closed with a Mynx closure device.  The right common femoral artery will be manually pulled and held.  The anatomy has been reviewed with Dr. Lovena Le who is anticipating biventricular pacing (CRT).  The patient left lab in stable condition.  Patient Profile     76 y.o. male w/PMHx of chronic CHF (systolic), CAD (remote CABG, cath 04/2021  with patent grafts), AFib, HTN, HLD , stroke, OSA (w/CPAP), DM, LBBB, Obesity, COPD (chronic home O2)  Last seen by Dr. Lovena Le planned for AV node ablation with continued AFib fast rates despite nodal blocking agents in need for better CRT/HR optimization  Admitted to [facility] 08/28/21 with progressive SOB, edema and increasing home O2 needs found volume OL and IV diuretics started  Device information BSCi CRT-D implanted 05/07/21 A lead is LB pacing RV/ICD lead is RV apex CS lead in the CS/lateral wall  Assessment & Plan    Longstanding persistent ?permanent AFib CHA2DS2Vasc is 8, on Eliquis (none yesterday) Planned for AV node ablation today (scheduled outpt) No plans for DCCV  2. Acute/chronic CHF (systolic) Cumulatively negative -3576ml   The patient reports that while he has baseline SOB, uses O2 at home, he is feeling better but not yet back to his "normal" SOB, but he feels ready for his procedure Would continue IV lasix for now BMET stable  3.  CAD No anginal complaints  For questions or updates, please contact [place] Please consult [email or web address] for contact info under   Signed, Baldwin Jamaica, PA-C  09/01/2021, 8:24 AM    EP Attending  Patient seen and examined. Agree with above. See my note from yesterday as well. He is stable for AV node ablation. I have reviewed the indications/risks/benefits/goals/expectations and he wishes to proceed.  Carleene Overlie Braylynn Ghan,MD

## 2021-09-01 NOTE — Progress Notes (Signed)
Home CPAP unit at bedside. Distilled water added per pt request. 4L O2 bleed in set up. Patient is able to place on/off as needed.

## 2021-09-01 NOTE — Progress Notes (Signed)
OT Cancellation Note  Patient Details Name: Tommy Riley MRN: [id number] DOB: 03-22-1946   Cancelled Treatment:    Reason Eval/Treat Not Completed: Active bedrest order Patient is on bedrest for the next three hours. Will follow back as time allows.   Corinne Ports E. Ayline Dingus, OTR/L Acute Rehabilitation Services [phone] [place] 09/01/2021, 2:08 PM

## 2021-09-01 NOTE — Progress Notes (Signed)
Progress Note   Patient: Tommy Riley OJJ:[id number] DOB: 1945-08-17 DOA: 08/28/2021     4 DOS: the patient was seen and examined on 09/01/2021   Brief hospital course: Tommy Riley was admitted to the hospital with the working diagnosis of decompensated heart failure.   76 yo male with the past medical history of atrial fibrillation/ flutter, heart failure, COPD with chronic hypoxemic respiratory failure, T2DM, dyslipidemia and obesity class 3 who presented with dyspnea. Reported 2 weeks of worsening lower extremity edema, associated with dyspnea and increased abdominal girth. Positive orthopnea. He increased his oxygen flow with no improvement in his symptoms. On his initial physical examination his blood pressure was 132/92, HR 78, RR 30, oxygen saturation 94% and temperature 98,5, lungs with bibasilar rales with no wheezing, heart with S1 and S2 present, no gallops or murmurs, abdomen non distended and positive lower extremity edema.   Na 143, K 3,6, Cl 100, bicarbonate 33, glucose 180, bun 23 and cr 1,25 BNP 200 High sensitive troponin 54 and 57 Wbc 5.4 hgb 13.0 hct 43.2 and plt 192  Sars covid 19 negative   Chest radiograph with cardiomegaly with bilateral lower lobes interstitial infiltrates with small bilateral pleural effusions. (Pacer in place)   EKG 106 bpm, normal axis, left bundle branch block, qtc 526, atrial fibrillation rhythm with q wave V1 and V2 ST depression and  T wave inversion on lead II, III and AvF.   Patient has been placed on diuretic therapy with good response.   Plan for AV ablation tomorrow per cardiology recommendations.   Assessment and Plan: * Acute CHF (congestive heart failure) (HCC) Echocardiogram from 04/2021 with LV EF 35 to 40%, with global hypokinesis, mild reduction in RV systolic function, severe elevation in pulmonary systolic pressure up to 66 mmHg. Severe dilatation of right and left atriums.   Urine output over last 24 hrs 2,025 ml, slowly  improving symptoms.  Continue diuresis with IV furosemide 60 mg IV q12 hrs.  Heart failure regimen with entresto, dapagliflozin, spironolactone and metoprolol. Blood pressure 118 to 124 mmHg.   Continue to encourage mobility, out of bed to chair, PT and OT.    Permanent atrial fibrillation ([place])- (present on admission) Uncontrolled chronic atrial fibrillation. Old records personally reviewed office visit to cardiology on 08/21/21.  Plan for AV node ablation on 09/01/21.   On metoprolol for rate control, continue to hold on anticoagulation.    CKD (chronic kidney disease) stage 2, GFR 60-89 ml/min- (present on admission) Hypokalemia  His renal function continue to be stable with serum cr at 1,24 with K at 4,4 and serum bicarbonate at 34. Continue close follow up on renal function and electrolytes, avoid hypotension and nephrotoxic medications.    COPD (chronic obstructive pulmonary disease) ([place])- (present on admission) No signs acute  exacerbation Continue with bronchodilator therapy. Continue oxymetry monitoring.  Patient with limited physical functional capacity due to chronic poor cardiopulmonary reserve.   Type 2 diabetes mellitus with hyperlipidemia ([place])- (present on admission) Fasting glucose today is 100   Continue with basal insulin 25 units with detemir Continue insulin sliding scale for glucose cover and monitoring Continue with statin therapy.   Gout- (present on admission) No signs of acute flare, will resume home dose of allopurinol.   OSA on CPAP Continue with Cpap.  Obesity- (present on admission) Calculated BMI is 38,1         Subjective: patient reports improvement of his symptoms but not yet back to baseline, continue to  have dyspnea and decreased mobility   Physical Exam: Vitals:   08/31/21 1950 08/31/21 2004 09/01/21 0304 09/01/21 0832  BP: 118/67  124/78   Pulse: 94  68   Resp: 18  18   Temp: 98 F (36.7 C)  (!) 97.4 F (36.3 C)    TempSrc: Oral  Oral   SpO2: 99% 98% 100% 98%  Weight:   129.3 kg   Height:       Neurology awake and alert ENT with no pallor Cardiovascular with S1 and S2 present, irregularly irregular with no gallops or murmurs, no rubs No JVD Positive lower extremity edema + bilaterally, compression stockings in place Respiratory with scattered rales and rhonchi with no wheezing Abdomen soft not distended   Data Reviewed:    Family Communication: no family at the beside   Disposition: Status is: Inpatient Remains inpatient appropriate because: heart failure for AV ablation      Planned Discharge Destination: Home      Author: Tawni Millers, MD 09/01/2021 9:49 AM  For on call review [email or web address].

## 2021-09-01 NOTE — Progress Notes (Signed)
PT Cancellation Note  Patient Details Name: Tommy Riley MRN: [id number] DOB: Aug 09, 1945   Cancelled Treatment:    Reason Eval/Treat Not Completed: Active bedrest order until 5:30PM today following procedure this morning. Will continue to follow as time/schedule allow.   West Carbo, PT, DPT   Acute Rehabilitation Department Pager #: [phone]   Sandra Cockayne 09/01/2021, 3:10 PM

## 2021-09-01 NOTE — Progress Notes (Signed)
Site area: rt groin venous sheath Site Prior to Removal:  Level 0 Pressure Applied For: 10 minutes Manual:   yes Patient Status During Pull:  stable Post Pull Site:  Level 0 Post Pull Instructions Given:  yes Post Pull Pulses Present: rt dp dopplered Dressing Applied:  gauze and tegaderm Bedrest begins @ 3353 Comments:

## 2021-09-02 LAB — BASIC METABOLIC PANEL
Anion gap: 10 (ref 5–15)
BUN: 23 mg/dL (ref 8–23)
CO2: 36 mmol/L — ABNORMAL HIGH (ref 22–32)
Calcium: 9 mg/dL (ref 8.9–10.3)
Chloride: 98 mmol/L (ref 98–111)
Creatinine, Ser: 1.1 mg/dL (ref 0.61–1.24)
GFR, Estimated: >60 mL/min (ref >60)
Glucose, Bld: 91 mg/dL (ref 70–99)
Potassium: 2.9 mmol/L — ABNORMAL LOW (ref 3.5–5.1)
Sodium: 144 mmol/L (ref 135–145)

## 2021-09-02 LAB — GLUCOSE, CAPILLARY
Glucose-Capillary: 97 mg/dL (ref 70–99)
Glucose-Capillary: 128 mg/dL — ABNORMAL HIGH (ref 70–99)

## 2021-09-02 LAB — POTASSIUM: Potassium: 3.3 mmol/L — ABNORMAL LOW (ref 3.5–5.1)

## 2021-09-02 MED ORDER — POTASSIUM CHLORIDE CRYS ER 20 MEQ PO TBCR
40.0000 meq | EXTENDED_RELEASE_TABLET | Freq: Once | ORAL | Status: AC
Start: 2021-09-02 — End: 2021-09-02
  Administered 2021-09-02: 40 meq via ORAL
  Filled 2021-09-02: qty 2

## 2021-09-02 MED ORDER — APIXABAN 5 MG PO TABS: 5.0000 mg | ORAL_TABLET | Freq: Two times a day (BID) | ORAL | 5 refills | Status: DC

## 2021-09-02 MED ORDER — POTASSIUM CHLORIDE CRYS ER 20 MEQ PO TBCR
40.0000 meq | EXTENDED_RELEASE_TABLET | Freq: Once | ORAL | Status: AC
  Administered 2021-09-02: 40 meq via ORAL
  Filled 2021-09-02: qty 2

## 2021-09-02 MED ORDER — POTASSIUM CHLORIDE CRYS ER 20 MEQ PO TBCR
40.0000 meq | EXTENDED_RELEASE_TABLET | ORAL | Status: DC
  Administered 2021-09-02: 40 meq via ORAL
  Filled 2021-09-02: qty 2

## 2021-09-02 NOTE — TOC Progression Note (Addendum)
Transition of Care [facility]) - Progression Note    Patient Details  Name: Tommy Riley MRN: [id number] Date of Birth: Apr 06, 1946  [facility] CM/SW Contact  Zenon Mayo, RN Phone Number: 09/02/2021, 10:23 AM  Clinical Narrative:    Patient is for possible dc today, he states his wife will transport him home at dc and she will bring his oxygen.  His potassium is very low, will replete and check k this afternoon.         Expected Discharge Plan and Services                                                 Social Determinants of Health (SDOH) Interventions    Readmission Risk Interventions Readmission Risk Prevention Plan 04/16/2021  Transportation Screening Complete  Home Care Screening Complete  Medication Review (RN CM) Complete  Some recent data might be hidden

## 2021-09-02 NOTE — Discharge Summary (Signed)
Physician Discharge Summary   Patient: Tommy Riley MRN: [id number] DOB: Jul 24, 1945  Admit date:     08/28/2021  Discharge date: 09/02/21  Discharge Physician: Jimmy Picket [place]   PCP: Clinton Quant, MD   Recommendations at discharge:    Plan to resume anticoagulation with apixaban on 09/03/21 Continue diuresis with torsemide and follow up as outpatient.  Follow up renal function and electrolytes in 7 days.   Discharge Diagnoses: Principal Problem:   Acute CHF (congestive heart failure) (HCC) Active Problems:   Permanent atrial fibrillation (HCC)   CKD (chronic kidney disease) stage 2, GFR 60-89 ml/min   COPD (chronic obstructive pulmonary disease) (HCC)   Type 2 diabetes mellitus with hyperlipidemia (HCC)   Gout   OSA on CPAP   Obesity  Resolved Problems:   * No resolved hospital problems. [facility] Course: Mr. Sage was admitted to the hospital with the working diagnosis of decompensated heart failure.   76 yo male with the past medical history of atrial fibrillation/ flutter, heart failure, COPD with chronic hypoxemic respiratory failure, T2DM, dyslipidemia and obesity class 3 who presented with dyspnea. Reported 2 weeks of worsening lower extremity edema, associated with dyspnea and increased abdominal girth. Positive orthopnea. He increased his oxygen flow with no improvement in his symptoms. On his initial physical examination his blood pressure was 132/92, HR 78, RR 30, oxygen saturation 94% and temperature 98,5, lungs with bibasilar rales with no wheezing, heart with S1 and S2 present, no gallops or murmurs, abdomen non distended and positive lower extremity edema.   Na 143, K 3,6, Cl 100, bicarbonate 33, glucose 180, bun 23 and cr 1,25 BNP 200 High sensitive troponin 54 and 57 Wbc 5.4 hgb 13.0 hct 43.2 and plt 192  Sars covid 19 negative   Chest radiograph with cardiomegaly with bilateral lower lobes interstitial infiltrates with small bilateral  pleural effusions. (Pacer in place)   EKG 106 bpm, normal axis, left bundle branch block, qtc 526, atrial fibrillation rhythm with q wave V1 and V2 ST depression and  T wave inversion on lead II, III and AvF.   Patient has been placed on diuretic therapy with good response.  Improvement in volume status.   Electrophysiology was consulted and patient underwent AV node ablation with good toleration.   Patient will follow up as outpatient.   Assessment and Plan: * Acute CHF (congestive heart failure) ([place]) Patient was admitted to the cardiac unit, he was placed on aggressive diuresis with furosemide with good toleration, negative fluid balance was achieved, - 5,760 ml with significant improvement in his symptoms.   Echocardiogram from 04/2021 with LV EF 35 to 40%, with global hypokinesis, mild reduction in RV systolic function, severe elevation in pulmonary systolic pressure up to 66 mmHg. Severe dilatation of right and left atriums.   Patient will continue medical therapy with entresto, dapagliflozin, spironolactone and metoprolol. Resume oral torsemide and plan to follow up as outpatient.    Permanent atrial fibrillation ([place])- (present on admission) Uncontrolled chronic atrial fibrillation.  Patient had fast heart rate, rapid ventricular response despite nodal blocking agents and need for better CRT/ HR optimization.   Electrophysiology was consulted and patient underwent AV node ablation as scheduled with good toleration.   Patient will continue with metoprolol and plan to resume anticoagulation on 09/03/21.  Follow up as outpatient.      CKD (chronic kidney disease) stage 2, GFR 60-89 ml/min- (present on admission) Hypokalemia  Patient responded well to aggressive  diuresis, renal function remained stable. K was corrected with KCL  At the time of his discharge his serum cr was 1.1 with serum bicarbonate at 36.  Will follow on K before his discharge. Continue diuresis with  torsemide along with K supplementation.  Follow up renal function and electrolytes as outpatient in 7 days.   COPD (chronic obstructive pulmonary disease) ([place])- (present on admission) No signs acute  exacerbation Continue with bronchodilator therapy. Continue supplemental -02 per [place]   Type 2 diabetes mellitus with hyperlipidemia ([place])- (present on admission) His glucose remained stable during his hospitalization.  At the time of his discharge his fasting glucose was 98 mg/dl  Plan to continue insulin therapy at home.   Continue with statin therapy.   Gout- (present on admission) No signs of acute flare, continue with allopurinol.   OSA on CPAP Continue with Cpap.  Obesity- (present on admission) Calculated BMI is 38,1            Consultants: electrophysiology  Procedures performed: AV node ablation   Disposition: Home Diet recommendation:  Cardiac diet  DISCHARGE MEDICATION: Allergies as of 09/02/2021       Reactions   Codeine Anaphylaxis, Shortness Of Breath, Other (See Comments)   Tightness in chest        Medication List     TAKE these medications    Accu-Chek Aviva Plus test strip Generic drug: glucose blood   albuterol 108 (90 Base) MCG/ACT inhaler Commonly known as: VENTOLIN HFA USE 2 INHALATIONS BY MOUTH  EVERY 4 HOURS AS NEEDED FOR SHORTNESS OF BREATH OR  WHEEZE   albuterol (2.5 MG/3ML) 0.083% nebulizer solution Commonly known as: PROVENTIL Take 3 mLs (2.5 mg total) by nebulization every 6 (six) hours as needed for wheezing or shortness of breath.   Allopurinol 200 MG Tabs Take 200 mg by mouth in the morning and at bedtime.   apixaban 5 MG Tabs tablet Commonly known as: ELIQUIS Take 1 tablet (5 mg total) by mouth 2 (two) times daily. Resume taking apixaban on 09/03/21 What changed: additional instructions   aspirin 81 MG EC tablet Take 81 mg by mouth in the morning.   diphenhydramine-acetaminophen 25-500 MG Tabs tablet Commonly  known as: TYLENOL PM Take 2 tablets by mouth at bedtime.   Entresto 24-26 MG Generic drug: sacubitril-valsartan Take 1 tablet by mouth 2 (two) times daily.   ezetimibe 10 MG tablet Commonly known as: ZETIA Take 10 mg by mouth in the morning.   Farxiga 10 MG Tabs tablet Generic drug: dapagliflozin propanediol Take 1 tablet (10 mg total) by mouth daily.   fluorouracil 5 % cream Commonly known as: EFUDEX Apply 1 application topically in the morning, at noon, in the evening, and at bedtime.   folic acid 127 MCG tablet Commonly known as: FOLVITE Take 400 mcg by mouth every evening.   insulin lispro 100 UNIT/ML injection Commonly known as: HUMALOG Inject 0-20 Units into the skin 4 (four) times daily -  before meals and at bedtime. Sliding Scale Insulin   Levemir FlexPen 100 UNIT/ML FlexPen Generic drug: insulin detemir Inject 50 Units into the skin in the morning and at bedtime.   loratadine 10 MG tablet Commonly known as: CLARITIN Take 10 mg by mouth in the morning.   melatonin 5 MG Tabs Take 5 mg by mouth at bedtime.   metoprolol 200 MG 24 hr tablet Commonly known as: TOPROL-XL Take 1 tablet (200 mg total) by mouth daily. Take with or immediately following a  meal.   multivitamin with minerals Tabs tablet Take 1 tablet by mouth in the morning.   omega-3 acid ethyl esters 1 g capsule Commonly known as: LOVAZA Take 2 capsules (2 g total) by mouth 2 (two) times daily.   ONE TOUCH ULTRA 2 w/Device Kit by Does not apply route.   oxymetazoline 0.05 % nasal spray Commonly known as: AFRIN Place 1 spray into both nostrils 2 (two) times daily.   potassium chloride SA 20 MEQ tablet Commonly known as: KLOR-CON M Take 2 tablets (40 mEq total) by mouth 2 (two) times daily.   rosuvastatin 40 MG tablet Commonly known as: CRESTOR Take 1 tablet (40 mg total) by mouth daily.   sertraline 100 MG tablet Commonly known as: ZOLOFT Take 150 mg by mouth in the morning.    spironolactone 25 MG tablet Commonly known as: ALDACTONE Take 1 tablet (25 mg total) by mouth daily.   torsemide 20 MG tablet Commonly known as: DEMADEX Take 4 tablets (80 mg total) by mouth 2 (two) times daily. May take an additional 20 mg tablet daily as needed for 2-3 lbs weight gain in one day or 5 lbs in 1 week   Trelegy Ellipta 100-62.5-25 MCG/ACT Aepb Generic drug: Fluticasone-Umeclidin-Vilant Inhale 1 puff into the lungs daily. Patient needs 90 day script with 3 refills.   vitamin C 500 MG tablet Commonly known as: ASCORBIC ACID Take 500 mg by mouth in the morning and at bedtime.   Vitamin D3 50 MCG (2000 UT) Tabs Take 1 capsule by mouth in the morning and at bedtime.   zinc sulfate 220 (50 Zn) MG capsule Take 220 mg by mouth every evening.        Follow-up Information     [facility] Follow up.   Specialty: Cardiology Why: 09/18/21 @ 11:20AM, routine pacmaker programming after your ablation procedure Contact information: [address] [place] [phone]        Shirley Friar, [place] .   Specialty: Physician Assistant Why: 09/30/21 @ 8:40AM, for Dr. Lonzo Candy information: [place] [address] [phone]                 Discharge Exam: Danley Danker Weights   08/31/21 0452 09/01/21 0304 09/02/21 0343  Weight: 128.2 kg 129.3 kg 127.5 kg   BP 133/82 (BP Location: Left Arm)    Pulse 91    Temp (!) 97.5 F (36.4 C) (Oral)    Resp 19    Ht 6' (1.829 m)    Wt 127.5 kg Comment: scale c   SpO2 97%    BMI 38.12 kg/m   Patient is feeling better, dyspnea and edema have improved  Neurology awake and alert ENT with no pallor Cardiovascular with S1 and S2 present and rhythmic (paced) with no gallops or rubs, positive systolic murmur at the apex No JVD No lower extremity edema, compression stockings in place.  Respiratory with no wheezing or rhonchi Abdomen  soft and non tender   Condition at discharge: stable  The results of significant diagnostics from this hospitalization (including imaging, microbiology, ancillary and laboratory) are listed below for reference.   Imaging Studies: DG Chest 2 View  Result Date: 08/28/2021 CLINICAL DATA:  Shortness of breath.  Extremity swelling. EXAM: CHEST - 2 VIEW COMPARISON:  Radiograph 05/19/2021.  Chest CT 04/23/2020 FINDINGS: Cardiomegaly with questionable increase from prior exam. Post median sternotomy and CABG. Left-sided pacemaker in  place. There are small bilateral pleural effusions. Interstitial and septal thickening consistent with pulmonary edema. No pneumothorax. No confluent consolidation. No acute osseous abnormalities are seen. IMPRESSION: Findings consistent with CHF. Cardiomegaly with pulmonary edema and small pleural effusions. Electronically Signed   By: Keith Rake M.D.   On: 08/28/2021 19:56   EP STUDY  Result Date: 09/01/2021 Conclusion: Successful AV node ablation in a patient with uncontrolled atrial fibrillation who had previously undergone biventricular ICD insertion. Cristopher Peru, MD   CUP PACEART REMOTE DEVICE CHECK  Result Date: 08/07/2021 Scheduled remote reviewed. Normal device function.  HL=29 Next remote 91 days. [facility]   Microbiology: Results for orders placed or performed during the hospital encounter of 08/28/21  Resp Panel by RT-PCR (Flu A&B, Covid) Nasopharyngeal Swab     Status: None   Collection Time: 08/28/21  8:36 PM   Specimen: Nasopharyngeal Swab; Nasopharyngeal(NP) swabs in vial transport medium  Result Value Ref Range Status   SARS Coronavirus 2 by RT PCR NEGATIVE NEGATIVE Final    Comment: (NOTE) SARS-CoV-2 target nucleic acids are NOT DETECTED.  The SARS-CoV-2 RNA is generally detectable in upper respiratory specimens during the acute phase of infection. The lowest concentration of SARS-CoV-2 viral copies this assay can detect is 138 copies/mL. A  negative result does not preclude SARS-Cov-2 infection and should not be used as the sole basis for treatment or other patient management decisions. A negative result may occur with  improper specimen collection/handling, submission of specimen other than nasopharyngeal swab, presence of viral mutation(s) within the areas targeted by this assay, and inadequate number of viral copies(<138 copies/mL). A negative result must be combined with clinical observations, patient history, and epidemiological information. The expected result is Negative.  Fact Sheet for Patients:  EntrepreneurPulse.com.au  Fact Sheet for Healthcare Providers:  IncredibleEmployment.be  This test is no t yet approved or cleared by the [place] FDA and  has been authorized for detection and/or diagnosis of SARS-CoV-2 by FDA under an Emergency Use Authorization (EUA). This EUA will remain  in effect (meaning this test can be used) for the duration of the COVID-19 declaration under Section 564(b)(1) of the Act, 21 U.S.C.section 360bbb-3(b)(1), unless the authorization is terminated  or revoked sooner.       Influenza A by PCR NEGATIVE NEGATIVE Final   Influenza B by PCR NEGATIVE NEGATIVE Final    Comment: (NOTE) The Xpert Xpress SARS-CoV-2/FLU/RSV plus assay is intended as an aid in the diagnosis of influenza from Nasopharyngeal swab specimens and should not be used as a sole basis for treatment. Nasal washings and aspirates are unacceptable for Xpert Xpress SARS-CoV-2/FLU/RSV testing.  Fact Sheet for Patients: EntrepreneurPulse.com.au  Fact Sheet for Healthcare Providers: IncredibleEmployment.be  This test is not yet approved or cleared by the [place] FDA and has been authorized for detection and/or diagnosis of SARS-CoV-2 by FDA under an Emergency Use Authorization (EUA). This EUA will remain in effect (meaning this test can  be used) for the duration of the COVID-19 declaration under Section 564(b)(1) of the Act, 21 U.S.C. section 360bbb-3(b)(1), unless the authorization is terminated or revoked.  Performed at [facility], [place] [address] [place], [place] [address]     Labs: CBC: Recent Labs  Lab 08/28/21 1821 08/29/21 0439  WBC 5.4 5.0  NEUTROABS 4.2  --   HGB 13.0 14.1  HCT 43.2 47.2  MCV 91.1 92.2  PLT 192 354*   Basic Metabolic Panel: Recent Labs  Lab 08/29/21 0439 08/30/21  0093 08/31/21 0251 09/01/21 0722 09/02/21 0301  NA 144 141 142 143 144  K 4.1 3.7 3.3* 4.4 2.9*  CL 104 98 99 100 98  CO2 28 31 34* 34* 36*  GLUCOSE 116* 105* 112* 100* 91  BUN 22 24* 27* 23 23  CREATININE 1.22 1.23 1.20 1.24 1.10  CALCIUM 9.2 9.1 9.1 9.0 9.0  MG  --  1.8 2.0 2.2  --    Liver Function Tests: No results for input(s): AST, ALT, ALKPHOS, BILITOT, PROT, ALBUMIN in the last 168 hours. CBG: Recent Labs  Lab 09/01/21 1105 09/01/21 1518 09/01/21 2108 09/02/21 0644 09/02/21 1105  GLUCAP 98 124* 218* 97 128*    Discharge time spent: greater than 30 minutes.  Signed: Tawni Millers, MD [facility] 09/02/2021

## 2021-09-02 NOTE — Progress Notes (Signed)
Discharge instructions given. Patient verbalized understanding and all questions were answered.  ?

## 2021-09-02 NOTE — TOC Transition Note (Signed)
Transition of Care [facility]) - CM/SW Discharge Note   Patient Details  Name: Tommy Riley MRN: [id number] Date of Birth: 03-20-1946  Transition of Care [facility] CM/SW Contact:  Zenon Mayo, RN Phone Number: 09/02/2021, 3:15 PM   Clinical Narrative:     Patient is for dc today, needs a rollator, NCM made referral to [facility] with [employer], the rollator will be delivered to patient room prior to dc.         Patient Goals and CMS Choice        Discharge Placement                       Discharge Plan and Services                                     Social Determinants of Health (SDOH) Interventions     Readmission Risk Interventions Readmission Risk Prevention Plan 04/16/2021  Transportation Screening Complete  Home Care Screening Complete  Medication Review (RN CM) Complete  Some recent data might be hidden

## 2021-09-02 NOTE — Progress Notes (Signed)
Occupational Therapy Treatment Patient Details Name: Tommy Riley MRN: [id number] DOB: 1945-12-10 Today's Date: 09/02/2021   History of present illness 76 y.o. male who presented with dyspnea. Admitted for heart failure management and possible ablation. PMH:  type 2 diabetes mellitus, hyperlipidemia, CHF, CAD s/p CABG, COPD, pacemaker, OSA on CPAP, gout   OT comments  Patient continues to make steady progress towards goals in skilled OT session. Patient's session encompassed  lower body dressing and energy conservation education. Patient able to demonstrate lower body dressing sitting EOB, and had already full dressed to go home without issue. Patient able to state numerous strategies for energy conservation for discharge home, and is well-versed in monitoring his oxygen at home. Patient is discharged from OT services at this time. It has been a pleasure to treat this patient and be a part of his care.    Recommendations for follow up therapy are one component of a multi-disciplinary discharge planning process, led by the attending physician.  Recommendations may be updated based on patient status, additional functional criteria and insurance authorization.    Follow Up Recommendations  No OT follow up    Assistance Recommended at Discharge Intermittent Supervision/Assistance  Patient can return home with the following  A little help with walking and/or transfers;A little help with bathing/dressing/bathroom;Assist for transportation;Help with stairs or ramp for entrance   Equipment Recommendations  None recommended by OT    Recommendations for Other Services      Precautions / Restrictions Precautions Precautions: None Precaution Comments: watch SpO2 (on 4L baseline) Restrictions Weight Bearing Restrictions: No       Mobility Bed Mobility               General bed mobility comments: Sitting EOB upon entry    Transfers                         Balance  Overall balance assessment: Needs assistance, History of Falls Sitting-balance support: Feet supported Sitting balance-Leahy Scale: Good                                     ADL either performed or assessed with clinical judgement   ADL                       Lower Body Dressing: Modified independent                 General ADL Comments: Session focus on lower body dressing and education with regard to energy conservation    Extremity/Trunk Assessment              Vision       Perception     Praxis      Cognition Arousal/Alertness: Awake/alert Behavior During Therapy: WFL for tasks assessed/performed Overall Cognitive Status: Within Functional Limits for tasks assessed                                          Exercises      Shoulder Instructions       General Comments      Pertinent Vitals/ Pain       Pain Assessment Pain Assessment: No/denies pain  Home Living  Prior Functioning/Environment              Frequency  Min 2X/week        Progress Toward Goals  OT Goals(current goals can now be found in the care plan section)  Progress towards OT goals: Goals met/education completed, patient discharged from OT  Acute Rehab OT Goals Patient Stated Goal: to go home OT Goal Formulation: With patient Time For Goal Achievement: 09/13/21 Potential to Achieve Goals: Good  Plan All goals met and education completed, patient discharged from OT services    Co-evaluation                 AM-PAC OT "6 Clicks" Daily Activity     Outcome Measure   Help from another person eating meals?: None Help from another person taking care of personal grooming?: None Help from another person toileting, which includes using toliet, bedpan, or urinal?: None Help from another person bathing (including washing, rinsing, drying)?: A Little Help from another  person to put on and taking off regular upper body clothing?: None Help from another person to put on and taking off regular lower body clothing?: None 6 Click Score: 23    End of Session Equipment Utilized During Treatment: Oxygen  OT Visit Diagnosis: Unsteadiness on feet (R26.81);Other abnormalities of gait and mobility (R26.89);Repeated falls (R29.6);Muscle weakness (generalized) (M62.81)   Activity Tolerance Patient tolerated treatment well   Patient Left in bed;with call bell/phone within reach   Nurse Communication Mobility status        Time: 9323-5573 OT Time Calculation (min): 14 min  Charges: OT General Charges $OT Visit: 1 Visit OT Treatments $Self Care/Home Management : 8-22 mins  Corinne Ports E. Mivaan Corbitt, OTR/L Acute Rehabilitation Services [phone] [place] 09/02/2021, 9:59 AM

## 2021-09-02 NOTE — Progress Notes (Signed)
Physical Therapy Treatment Patient Details Name: Tommy Riley MRN: [id number] DOB: Mar 21, 1946 Today's Date: 09/02/2021   History of Present Illness 76 y.o. male who presented with dyspnea. Admitted for heart failure management and possible ablation. PMH:  type 2 diabetes mellitus, hyperlipidemia, CHF, CAD s/p CABG, COPD, pacemaker, OSA on CPAP, gout    PT Comments    Pt received in supine, agreeable to therapy session, received on room air with SpO2 reading 85%, once [place] replaced pt SpO2 WFL on 4L O2 [place]. Pt Supervision for transfers using cane or rollator and min guard using rollator for hallway gait trial and min guard with cane and railing for stair ascent/descent. Pt interested in rollator for home and would benefit from this for activity pacing/mobility progression and safety. Pt continues to benefit from PT services to progress toward functional mobility goals. DME updated below for clarity per pt now agreeable to PT recommendation (see previous session note) for rollator, RN notified.  Recommendations for follow up therapy are one component of a multi-disciplinary discharge planning process, led by the attending physician.  Recommendations may be updated based on patient status, additional functional criteria and insurance authorization.  Follow Up Recommendations  No PT follow up     Assistance Recommended at Discharge Set up Supervision/Assistance  Patient can return home with the following Help with stairs or ramp for entrance;A little help with walking and/or transfers;A little help with bathing/dressing/bathroom   Equipment Recommendations  Rollator (4 wheels);Other (comment) (bariatric; pt now agreeable)    Recommendations for Other Services       Precautions / Restrictions Precautions Precautions: None Precaution Comments: watch SpO2 (on 4L baseline) Restrictions Weight Bearing Restrictions: No     Mobility  Bed Mobility Overal bed mobility: Modified Independent              General bed mobility comments: use of bed features/rails    Transfers Overall transfer level: Needs assistance Equipment used: Rollator (4 wheels) Transfers: Sit to/from Stand Sit to Stand: Supervision           General transfer comment: pt agreeable to rollator this session, cues for use of brakes with fair carryover    Ambulation/Gait Ambulation/Gait assistance: Min guard (using cane) to Supervision (with rollator) Gait Distance (Feet): 100 Feet (x2 with seated break on rollator) Assistive device: Rollator (4 wheels); cane Gait Pattern/deviations: Step-through pattern, Decreased stride length, Trunk flexed       General Gait Details: Cues for Rollator use i.e. locking braces prior to sitting/standing and proximity. Also  needs reminders for activity pacing; SpO2 88-92% on 4L [place]   Stairs Stairs: Yes Stairs assistance: Min guard, +2 safety/equipment Stair Management: One rail Left, Forwards, With cane, Step to pattern Number of Stairs: 3 General stair comments: cues for sequencing, good use of cane, increased time to perform; needed seated break after performing for SpO2 to improve (88% with exertion on 4L). Second person present for assist with IV line and for safety but not physically assisting to ascend/descend   Wheelchair Mobility    Modified Rankin (Stroke Patients Only)       Balance Overall balance assessment: Needs assistance, History of Falls Sitting-balance support: Feet supported Sitting balance-Leahy Scale: Good     Standing balance support: Reliant on assistive device for balance Standing balance-Leahy Scale: Fair Standing balance comment: needs at least 1 UE support for gait but reliant on BUE support after stairs/when fatigued  Cognition Arousal/Alertness: Awake/alert Behavior During Therapy: WFL for tasks assessed/performed Overall Cognitive Status: Within Functional Limits for tasks  assessed                                 General Comments: pt received on RA with nasal cannula off under his chin, per pt he took it off to blow his nose and forgot to put it back in; SpO2 reading 85%, otherwise pt following 1 and 2 step cues well.        Exercises      General Comments General comments (skin integrity, edema, etc.): HR 90's bpm with exertion, SpO2 88-92% on 4L O2 [place]; received on RA SpO2 85%      Pertinent Vitals/Pain Pain Assessment Pain Assessment: No/denies pain     PT Goals (current goals can now be found in the care plan section) Acute Rehab PT Goals Patient Stated Goal: to be able to walk his dog PT Goal Formulation: With patient Time For Goal Achievement: 09/14/21 Progress towards PT goals: Progressing toward goals    Frequency    Min 3X/week      PT Plan Current plan remains appropriate       AM-PAC PT "6 Clicks" Mobility   Outcome Measure  Help needed turning from your back to your side while in a flat bed without using bedrails?: None Help needed moving from lying on your back to sitting on the side of a flat bed without using bedrails?: None Help needed moving to and from a bed to a chair (including a wheelchair)?: A Little Help needed standing up from a chair using your arms (e.g., wheelchair or bedside chair)?: A Little Help needed to walk in hospital room?: A Little Help needed climbing 3-5 steps with a railing? : A Little 6 Click Score: 20    End of Session Equipment Utilized During Treatment: Oxygen;Gait belt Activity Tolerance: Patient tolerated treatment well Patient left: in bed;with call bell/phone within reach Nurse Communication: Mobility status;Other (comment) (pt asking for rollator) PT Visit Diagnosis: Unsteadiness on feet (R26.81);Difficulty in walking, not elsewhere classified (R26.2)     Time: 5701-7793 PT Time Calculation (min) (ACUTE ONLY): 18 min  Charges:  $Gait Training: 8-22 mins                      Porter Nakama P., PTA Acute Rehabilitation Services Pager: [phone] Office: [place] 09/02/2021, 4:00 PM

## 2021-09-02 NOTE — Progress Notes (Addendum)
Progress Note  Patient Name: Tommy Riley Date of Encounter: 09/02/2021  [facility] Cardiologist: Rozann Lesches, MD   Subjective   I feel much better today, no groin pain, less SOB, no CP  Inpatient Medications    Scheduled Meds:  diphenhydrAMINE  50 mg Oral QHS   And   acetaminophen  1,000 mg Oral QHS   albuterol  2.5 mg Nebulization TID   allopurinol  100 mg Oral QHS   allopurinol  200 mg Oral Daily   vitamin C  500 mg Oral BID   aspirin EC  81 mg Oral Daily   cholecalciferol  2,000 Units Oral Daily   dapagliflozin propanediol  10 mg Oral Daily   ezetimibe  10 mg Oral Daily   fluticasone furoate-vilanterol  1 puff Inhalation Daily   folic acid  086 mcg Oral QPM   furosemide  60 mg Intravenous BID   insulin aspart  0-20 Units Subcutaneous TID AC & HS   insulin detemir  25 Units Subcutaneous Q2200   loratadine  10 mg Oral Daily   melatonin  5 mg Oral QHS   metoprolol succinate  200 mg Oral Daily   multivitamin with minerals  1 tablet Oral Daily   omega-3 acid ethyl esters  2 g Oral BID   potassium chloride  40 mEq Oral Q4H   rosuvastatin  40 mg Oral Daily   sacubitril-valsartan  1 tablet Oral BID   sertraline  150 mg Oral Daily   sodium chloride flush  3 mL Intravenous Q12H   spironolactone  25 mg Oral Daily   zinc sulfate  220 mg Oral QPM   Continuous Infusions:  sodium chloride     PRN Meds: sodium chloride, acetaminophen, albuterol, magnesium hydroxide, ondansetron (ZOFRAN) IV, sodium chloride flush, traZODone   Vital Signs    Vitals:   09/01/21 2037 09/01/21 2112 09/02/21 0343 09/02/21 0758  BP:  128/64 139/85   Pulse:  90 91   Resp:  18 20   Temp:  98.3 F (36.8 C) 98.5 F (36.9 C)   TempSrc:  Oral    SpO2: 100% 94% 100% 97%  Weight:   127.5 kg   Height:        Intake/Output Summary (Last 24 hours) at 09/02/2021 0826 Last data filed at 09/02/2021 0645 Gross per 24 hour  Intake 720 ml  Output 3225 ml  Net -2505 ml   Last 3 Weights  09/02/2021 09/01/2021 08/31/2021  Weight (lbs) 281 lb 1.6 oz 285 lb 282 lb 11.2 oz  Weight (kg) 127.506 kg 129.275 kg 128.232 kg      Telemetry    Afib, intermittent V pacing - Personally Reviewed  ECG    No new EKGs - Personally Reviewed  Physical Exam   GEN: No acute distress.   Neck: No JVD Cardiac: RRR (paced), 1-2/6 SM, rubs, or gallops.  Respiratory: soft crackles, ronchi b/l, RLL, moving air, not wheezing. GI: Soft, nontender, non-distended  MS: 1+ edema; cpmpression stocking on,UE less swollen, not pitting, no deformity. Neuro:  Nonfocal  Psych: Normal affect   Labs    High Sensitivity Troponin:   Recent Labs  Lab 08/28/21 1821 08/28/21 2047  TROPONINIHS 54* 57*     Chemistry Recent Labs  Lab 08/30/21 0232 08/31/21 0251 09/01/21 0722 09/02/21 0301  NA 141 142 143 144  K 3.7 3.3* 4.4 2.9*  CL 98 99 100 98  CO2 31 34* 34* 36*  GLUCOSE 105* 112* 100* 91  BUN 24* 27* 23 23  CREATININE 1.23 1.20 1.24 1.10  CALCIUM 9.1 9.1 9.0 9.0  MG 1.8 2.0 2.2  --   GFRNONAA >60 >60 >60 >60  ANIONGAP 12 9 9 10     Lipids No results for input(s): CHOL, TRIG, HDL, LABVLDL, LDLCALC, CHOLHDL in the last 168 hours.  Hematology Recent Labs  Lab 08/28/21 1821 08/29/21 0439  WBC 5.4 5.0  RBC 4.74 5.12  HGB 13.0 14.1  HCT 43.2 47.2  MCV 91.1 92.2  MCH 27.4 27.5  MCHC 30.1 29.9*  RDW 17.0* 17.0*  PLT 192 120*   Thyroid No results for input(s): TSH, FREET4 in the last 168 hours.  BNP Recent Labs  Lab 08/26/21 1239 08/28/21 1821  BNP  --  200.0*  PROBNP 353.0*  --     DDimer No results for input(s): DDIMER in the last 168 hours.   Radiology    EP STUDY  Result Date: 09/01/2021 Conclusion: Successful AV node ablation in a patient with uncontrolled atrial fibrillation who had previously undergone biventricular ICD insertion. Cristopher Peru, MD    Cardiac Studies   04/14/21: TTE 1. Left ventricular ejection fraction, by estimation, is 35 to 40%. The  left  ventricle has moderately decreased function. The left ventricle  demonstrates global hypokinesis. There is mild left ventricular  hypertrophy. Left ventricular diastolic  parameters are indeterminate. In Afib with RVR during study, consider  repeat limited echo to better evaluate systolic function once rates better  controlled.   2. Right ventricular systolic function is mildly reduced. The right  ventricular size is normal. There is severely elevated pulmonary artery  systolic pressure. The estimated right ventricular systolic pressure is  76.7 mmHg.   3. Left atrial size was severely dilated.   4. Right atrial size was severely dilated.   5. The mitral valve is normal in structure. Trivial mitral valve  regurgitation.   6. The aortic valve is tricuspid. Aortic valve regurgitation is not  visualized. No aortic stenosis is present.   7. The inferior vena cava is dilated in size with <50% respiratory  variability, suggesting right atrial pressure of 15 mmHg.   05/05/21: R/LHC   Ost RCA to Prox RCA lesion is 100% stenosed.   2nd Mrg lesion is 100% stenosed.   Mid LAD lesion is 95% stenosed.   There is moderate left ventricular systolic dysfunction.   LV end diastolic pressure is moderately elevated.   The left ventricular ejection fraction is 35-45% by visual estimate. HEMODYNAMICS:    1: Right atrial pressure-15/17 2: Right ventricular pressure-68/7 3: Pulmonary artery [id number] systolic, mean 40 4: [id number] 5: Cardiac output-4.9 L/min with an index of 2 L/min/m by Fick   IMPRESSION: Mr. [name] has patent grafts including a vein to the PDA and a Y graft originating from the internal mammary artery to the distal LAD and distal circumflex marginal branch.  He does have pulmonary hypertension.  His right common femoral vein was closed with a Mynx closure device.  The right common femoral artery will be manually pulled and held.  The anatomy has been reviewed with Dr. Lovena Le who is  anticipating biventricular pacing (CRT).  The patient left lab in stable condition.  Patient Profile     76 y.o. male w/PMHx of chronic CHF (systolic), CAD (remote CABG, cath 04/2021 with patent grafts), AFib, HTN, HLD , stroke, OSA (w/CPAP), DM, LBBB, Obesity, COPD (chronic home O2)  Last seen by Dr. Lovena Le planned for AV node ablation with  continued AFib fast rates despite nodal blocking agents in need for better CRT/HR optimization  Admitted to [facility] 08/28/21 with progressive SOB, edema and increasing home O2 needs found volume OL and IV diuretics started  Device information BSCi CRT-D implanted 05/07/21 A lead is LB pacing RV/ICD lead is RV apex CS lead in the CS/lateral wall  Assessment & Plan    Longstanding persistent ?permanent AFib CHA2DS2Vasc is 8, on Eliquis (none yesterday) S/p AV node ablation yesterday RESUME ELIQUIS TOMORROW 09/03/21 morning  Groin is soft, nontender  2. Acute/chronic CHF (systolic) Cumulatively negative -5660ml He looks and feels better today K+ replacement already addressed by medicine  3.  CAD No anginal complaints   Dr. Lovena Le has seen the patient Discussed post ablation groin care and activity restrictions EP and HF clinic follow up is in place OK to discharge from our perspective when felt ready medically otherwise For now No changes to cardiac meds Resume Eliquis TOMORROW 09/03/21    For questions or updates, please contact [place] Please consult [email or web address] for contact info under   Signed, Baldwin [place], PA-C  09/02/2021, 8:26 AM    EP Attending  Patient seen and examined. Agree with above. He is doing well after AV node ablation. He may be discharged home and followup with me in a couple of months. I can see him in [place].  Carleene Overlie Jeny Nield,MD

## 2021-09-03 ENCOUNTER — Telehealth: Payer: Self-pay

## 2021-09-03 MED ORDER — TORSEMIDE 20 MG PO TABS: 80.0000 mg | ORAL_TABLET | Freq: Two times a day (BID) | ORAL | 2 refills | Status: DC

## 2021-09-03 NOTE — Telephone Encounter (Signed)
Medication refill request for Torsemide 80 mg tablets (20 mg tablets) approved and sent to [facility] per pt request.  ?

## 2021-09-15 ENCOUNTER — Other Ambulatory Visit: Payer: Self-pay

## 2021-09-15 ENCOUNTER — Ambulatory Visit (HOSPITAL_COMMUNITY)
Admission: RE | Admit: 2021-09-15 | Discharge: 2021-09-15 | Disposition: A | Discharge status: Home / Self Care | Payer: Medicare Other | Source: Ambulatory Visit | Attending: Internal Medicine | Admitting: Internal Medicine

## 2021-09-15 ENCOUNTER — Other Ambulatory Visit (HOSPITAL_COMMUNITY): Payer: Medicare Other

## 2021-09-15 VITALS — BP 126/76 | HR 90 | Ht 72.0 in | Wt 273.4 lb

## 2021-09-15 DIAGNOSIS — I4821 Permanent atrial fibrillation: Secondary | ICD-10-CM

## 2021-09-15 DIAGNOSIS — Z95810 Presence of automatic (implantable) cardiac defibrillator: Secondary | ICD-10-CM | POA: Diagnosis not present

## 2021-09-15 DIAGNOSIS — I5041 Acute combined systolic (congestive) and diastolic (congestive) heart failure: Secondary | ICD-10-CM | POA: Diagnosis present

## 2021-09-15 DIAGNOSIS — Z9989 Dependence on other enabling machines and devices: Secondary | ICD-10-CM

## 2021-09-15 DIAGNOSIS — G4733 Obstructive sleep apnea (adult) (pediatric): Secondary | ICD-10-CM | POA: Diagnosis not present

## 2021-09-15 DIAGNOSIS — I5022 Chronic systolic (congestive) heart failure: Secondary | ICD-10-CM

## 2021-09-15 LAB — BASIC METABOLIC PANEL
Anion gap: 10 (ref 5–15)
BUN: 17 mg/dL (ref 8–23)
CO2: 37 mmol/L — ABNORMAL HIGH (ref 22–32)
Calcium: 9.2 mg/dL (ref 8.9–10.3)
Chloride: 99 mmol/L (ref 98–111)
Creatinine, Ser: 1.2 mg/dL (ref 0.61–1.24)
GFR, Estimated: >60 mL/min (ref >60)
Glucose, Bld: 108 mg/dL — ABNORMAL HIGH (ref 70–99)
Potassium: 3.7 mmol/L (ref 3.5–5.1)
Sodium: 146 mmol/L — ABNORMAL HIGH (ref 135–145)

## 2021-09-15 LAB — BRAIN NATRIURETIC PEPTIDE: B Natriuretic Peptide: 258.7 pg/mL — ABNORMAL HIGH (ref 0.0–100.0)

## 2021-09-15 NOTE — Patient Instructions (Signed)
Medication Changes: ? ?Torsemide 80 mg Twice daily ? ? ?Lab Work: ? ?Labs done today, your results will be available in MyChart, we will contact you for abnormal readings. ? ? ?Testing/Procedures: ? ?none ? ?Referrals: ? ?none ? ?Special Instructions // Education: ? ?none ? ?Follow-Up in: 6 months  ? ?At the [facility], you and your health needs are our priority. We have a designated team specialized in the treatment of Heart Failure. This Care Team includes your primary Heart Failure Specialized Cardiologist (physician), Advanced Practice Providers (APPs- Physician Assistants and Nurse Practitioners), and Pharmacist who all work together to provide you with the care you need, when you need it.  ? ?You may see any of the following providers on your designated Care Team at your next follow up: ? ?Dr Glori Bickers ?Dr Loralie Champagne ?Darrick Grinder, NP ?Lyda Jester, PA ?Jessica Milford,NP ?Marlyce Huge, PA ?Audry Riles, PharmD ? ? ?Please be sure to bring in all your medications bottles to every appointment.  ? ?Need to Contact us: ? ?If you have any questions or concerns before your next appointment please send [place] a message through [place] or call our office at [phone].   ? ?TO LEAVE A MESSAGE FOR THE NURSE SELECT OPTION 2, PLEASE LEAVE A MESSAGE INCLUDING: ?YOUR NAME ?DATE OF BIRTH ?CALL BACK NUMBER ?REASON FOR CALL**this is important as we prioritize the call backs ? ?YOU WILL RECEIVE A CALL BACK THE SAME DAY AS LONG AS YOU CALL BEFORE 4:00 PM ? ? ?

## 2021-09-15 NOTE — Progress Notes (Signed)
ReDS Vest / Clip - 09/15/21 1403   ? ?  ? ReDS Vest / Clip  ? Station Marker D   ? Ruler Value 40   ? ReDS Value Range Moderate volume overload   ? ReDS Actual Value 36   ? ?  ?  ? ?  ? ? ?

## 2021-09-15 NOTE — Progress Notes (Incomplete)
ADVANCED HF CLINIC CONSULT NOTE   Primary Care: Pomposini, [name], MD HF Cardiologist: Dr. Haroldine Laws  HPI: Tommy Riley is a 76 y.o. male with history of chronic systolic HF, ischemic cardiomyopathy, CAD s/p CABG in 2001, permanent atrial fibrillation s/p atrial fibrillation and atrial flutter ablation in 2001, OSA on CPAP, DM II.    Previously seen by Dr. Gilles Chiquito at [facility] for pulmonary hypertension in 2018. Felt to be multifactorial and likely combination of WHO groups 2 and 3.    Admitted to [facility] 10/09-10/15/22 with a/c HF. Echo with LVEF 35-40%, RV mildly reduced, RVSP 66 mmHg, trivial MR.   Directly admitted from Cardiology clinic on 04/30/21 with a/c CHF. Diuresed with IV lasix, diamox, and metolazone.  Marland Kitchen Has LBBB with wide QRS. Seen by EP and unclear how much he would benefit from CRT. Underwent L/RHC  with patent grafts (Y graft from LIMA to distal LAD and distal circumflex marginal branch, vein graft to PDA) and showed pulmonary hypertension, CO preserved. Pulmonary HTN felt be d/t combination of WHO Group II and III. cMRI 11/02 with evidence of mixed ischemic and nonischemic cardiomyopathy. LVEF 34%. Underwent CRT-D on 11/02, did not get AV nodal ablation. IV lasix switched to Torsemide. Counseled extensively on cutting back fluid intake. Continued on GDMT with digoxin, spironolactone, entresto, farxiga and metoprolol succinate.  Discharge weight 280 lbs.  Admitted in 2/23 with recurrent HF in setting of AF. Underwent diuresis and AV node ablation by Dr. Lovena Le. Feeling better. Was taking torsemide 80 bid but a few days cut back to 60 bid.  Wears 2L  O2. Wears CPAP religiously. No CP. Mild DOE. Taking Eliquis without bleeding. Wants to lose weight. SBP usually 100-115.   Echo 10/22 EF 35-40% R V mildly reduced Personally reviewed    Past Medical History:  Diagnosis Date   Anxiety    Atrial fibrillation and flutter (HCC)    Atrial fibrillation ablation 2011 at [facility]   CAD  in native artery    Chronic systolic heart failure (HCC)    CKD (chronic kidney disease), stage III (HCC)    COPD (chronic obstructive pulmonary disease) ([place])    COVID-22 February 2020   Depression    Essential hypertension    Gout    Hyperlipidemia    Ischemic heart disease    OSA (obstructive sleep apnea)    Stroke ([place]) 2003   Type 2 diabetes mellitus (HCC)    Current Outpatient Medications  Medication Sig Dispense Refill   ACCU-CHEK AVIVA PLUS test strip      albuterol (PROVENTIL) (2.5 MG/3ML) 0.083% nebulizer solution Take 3 mLs (2.5 mg total) by nebulization every 6 (six) hours as needed for wheezing or shortness of breath. 75 mL 4   albuterol (VENTOLIN HFA) 108 (90 Base) MCG/ACT inhaler USE 2 INHALATIONS BY MOUTH  EVERY 4 HOURS AS NEEDED FOR SHORTNESS OF BREATH OR  WHEEZE 51 g 3   allopurinol (ZYLOPRIM) 100 MG tablet Taking two tablets by mouth in the morning and 1 tablet by mouth in the evening     apixaban (ELIQUIS) 5 MG TABS tablet Take 1 tablet (5 mg total) by mouth 2 (two) times daily. Resume taking apixaban on 09/03/21 60 tablet 5   aspirin 81 MG EC tablet Take 81 mg by mouth in the morning.     b complex vitamins capsule Take 1 capsule by mouth at bedtime.     Blood Glucose Monitoring Suppl (ONE TOUCH ULTRA 2)  W/DEVICE KIT by Does not apply route.     Cholecalciferol (VITAMIN D3) 2000 UNITS TABS Take 1 capsule by mouth in the morning and at bedtime.     digoxin (LANOXIN) 0.125 MG tablet Take 0.125 mg by mouth daily.     diphenhydramine-acetaminophen (TYLENOL PM) 25-500 MG TABS tablet Take 2 tablets by mouth at bedtime.     ezetimibe (ZETIA) 10 MG tablet Take 10 mg by mouth in the morning.     FARXIGA 10 MG TABS tablet Take 1 tablet (10 mg total) by mouth daily. 30 tablet 90   fluorouracil (EFUDEX) 5 % cream Apply 1 application topically in the morning, at noon, in the evening, and at bedtime.     Fluticasone-Umeclidin-Vilant (TRELEGY ELLIPTA) 100-62.5-25 MCG/INH AEPB  Inhale 1 puff into the lungs daily. Patient needs 90 day script with 3 refills. 725 each 3   folic acid (FOLVITE) 366 MCG tablet Take 400 mcg by mouth every evening.     GUAIFENESIN 1200 PO Take 1,200 mg by mouth in the morning. Mucinex     HUMALOG KWIKPEN 200 UNIT/ML KwikPen 20 Units as needed. On a sliding scale     insulin detemir (LEVEMIR) 100 UNIT/ML injection Inject 50 Units into the skin 2 (two) times daily. On a sliding scale     loratadine (CLARITIN) 10 MG tablet Take 10 mg by mouth in the morning.     melatonin 5 MG TABS Take 5 mg by mouth at bedtime.     metoprolol (TOPROL-XL) 200 MG 24 hr tablet Take 1 tablet (200 mg total) by mouth daily. Take with or immediately following a meal. 90 tablet 3   Multiple Vitamins-Minerals (CENTRUM CARDIO) TABS Take 1 tablet by mouth in the morning.     omega-3 acid ethyl esters (LOVAZA) 1 g capsule Take 2 capsules (2 g total) by mouth 2 (two) times daily.     oxymetazoline (AFRIN) 0.05 % nasal spray Place 1 spray into both nostrils 2 (two) times daily.     potassium chloride SA (KLOR-CON M) 20 MEQ tablet Take 2 tablets (40 mEq total) by mouth 2 (two) times daily. 360 tablet 3   RELION PEN NEEDLE 31G/8MM 31G X 8 MM MISC 3 (three) times daily.     rosuvastatin (CRESTOR) 40 MG tablet Take 1 tablet (40 mg total) by mouth daily. 90 tablet 3   sacubitril-valsartan (ENTRESTO) 24-26 MG Take 1 tablet by mouth 2 (two) times daily. 180 tablet 3   sertraline (ZOLOFT) 100 MG tablet Take 150 mg by mouth in the morning.     spironolactone (ALDACTONE) 25 MG tablet Take 1 tablet (25 mg total) by mouth daily. 90 tablet 3   torsemide (DEMADEX) 20 MG tablet Take 4 tablets (80 mg total) by mouth 2 (two) times daily. May take an additional 20 mg tablet daily as needed for 2-3 lbs weight gain in one day or 5 lbs in 1 week 990 tablet 2   vitamin C (ASCORBIC ACID) 500 MG tablet Take 500 mg by mouth in the morning and at bedtime.     zinc sulfate 220 (50 Zn) MG capsule Take 220  mg by mouth every evening.     No current facility-administered medications for this encounter.   Allergies  Allergen Reactions   Codeine Anaphylaxis, Shortness Of Breath and Other (See Comments)    Tightness in chest    Social History   Socioeconomic History   Marital status: Married    Spouse name: Versie Fleener  Number of children: 3   Years of education: 14   Highest education level: Some college, no degree  Occupational History    Comment: retired  Tobacco Use   Smoking status: Former    Packs/day: 2.50    Years: 50.00    Pack years: 125.00    Types: Cigarettes    Quit date: 06/17/2011    Years since quitting: 10.2   Smokeless tobacco: Never  Vaping Use   Vaping Use: Some days   Start date: 07/06/2017   Last attempt to quit: 04/19/2021   Substances: Flavoring  Substance and Sexual Activity   Alcohol use: No    Comment: 1 beer a month   Drug use: No   Sexual activity: Not on file  Other Topics Concern   Not on file  Social History Narrative   Consumes no caffeine   Social Determinants of Health   Financial Resource Strain: Low Risk    Difficulty of Paying Living Expenses: Not hard at all  Food Insecurity: No Food Insecurity   Worried About Charity fundraiser in the Last Year: Never true   Ran Out of Food in the Last Year: Never true  Transportation Needs: No Transportation Needs   Lack of Transportation (Medical): No   Lack of Transportation (Non-Medical): No  Physical Activity: Not on file  Stress: Not on file  Social Connections: Not on file  Intimate Partner Violence: Not on file   Family History  Problem Relation Age of Onset   Raynaud syndrome Mother    Stroke Father    Diabetes Mellitus II Father    Hypertension Father    Hypertension Sister    Diabetes Mellitus II Sister    Stroke Brother    Crohn's disease Brother    Breast cancer Brother    COPD Brother    BP 126/76    Pulse 90    Ht 6' (1.829 m)    Wt 124 kg (273 lb 6.4 oz)    SpO2  98%    BMI 37.08 kg/m   Wt Readings from Last 3 Encounters:  09/15/21 124 kg (273 lb 6.4 oz)  09/02/21 127.5 kg (281 lb 1.6 oz)  08/26/21 127.5 kg (281 lb)   PHYSICAL EXAM: General:  Obese male sitting in WC on O2 No resp difficulty HEENT: normal Neck: supple. no JVD. Carotids 2+ bilat; no bruits. No lymphadenopathy or thryomegaly appreciated. Cor: PMI nondisplaced. Irregular rate & rhythm. No rubs, gallops or murmurs. Lungs: clear Abdomen: obese soft, nontender, nondistended. No hepatosplenomegaly. No bruits or masses. Good bowel sounds. Extremities: no cyanosis, clubbing, rash, 1+ edema Neuro: alert & orientedx3, cranial nerves grossly intact. moves all 4 extremities w/o difficulty. Affect pleasant   Device interrogation: HL score 16, thoracic impedence ok, HR up. Activity level 0.2 hr/day  ASSESSMENT & PLAN:  1.Chronic systolic HF: - EF 09-23% on echo, RV mildly reduced - Etiology of LV dysfunction unclear but cMRI suggest mixed ischemic/nonischemic CM - Improved NYHA II, volume looks OK today on exam. - Continue torsemide 80 mg bid (increased recently by Nephrology). - Continue Entresto 24-26 mg bid. - Continue spiro 25 mg daily.  - Continue Toprol 200 mg daily. - Continue Farxiga 10 mg daily.  - Continue digoxin 0.125 mg daily (rate control). - He is doing better with limiting fluids to <2L/day, wife is helping. - May benefit from [place]. - BMET and dig today.   2. CAD s/p CABG - Grafts patent on cath 05/05/21. -  No chest pain. - Continue ASA + beta blocker + statin.   3. Pulmonary HTN - RHC 05/05/21:  RA 14 RV 68/7 PA 68/unrecorded (40) PCWP unobtained LVEDP 21 Fick  4.9/2.0  PVR (using LVEDP) = 3.9 WU - Mixed PH WHO Group II & III. - Continue torsemide and CPAP.  - Needs weight loss.   4. Chronic AF with LBBB and intermittent rate control  - Has been in AF > 10 years despite previous AF/AFL ablations - Rate controlled on digoxin + Toprol  - s/p CRT-D  implant 11/22. - Consider AV nodal ablation in the future if difficult to rate control.  - Continue Eliquis 5 mg bid. No bleeding issues.   5. OSA - On CPAP. - Continue nightly use.   6. Morbid obesity - Body mass index is 37.08 kg/m. - Needs weight loss.   Glori Bickers, MD  2:52 PM

## 2021-09-17 ENCOUNTER — Ambulatory Visit (INDEPENDENT_AMBULATORY_CARE_PROVIDER_SITE_OTHER): Payer: Medicare Other

## 2021-09-17 ENCOUNTER — Other Ambulatory Visit: Payer: Self-pay

## 2021-09-17 DIAGNOSIS — I5023 Acute on chronic systolic (congestive) heart failure: Secondary | ICD-10-CM

## 2021-09-17 LAB — CUP PACEART INCLINIC DEVICE CHECK
Date Time Interrogation Session: 20230315084431
Implantable Lead Implant Date: 20221103
Implantable Lead Implant Date: 20221103
Implantable Lead Implant Date: 20221103
Implantable Lead Location: 753858
Implantable Lead Location: 753860
Implantable Lead Location: 753860
Implantable Lead Model: 138
Implantable Lead Model: 3830
Implantable Lead Model: 4671
Implantable Lead Serial Number: 304581
Implantable Lead Serial Number: 852093
Implantable Pulse Generator Implant Date: 20221103
Lead Channel Pacing Threshold Amplitude: 0.4 V
Lead Channel Pacing Threshold Amplitude: 0.7 V
Lead Channel Pacing Threshold Amplitude: 1.5 V
Lead Channel Pacing Threshold Pulse Width: 0.4 ms
Lead Channel Pacing Threshold Pulse Width: 0.4 ms
Lead Channel Pacing Threshold Pulse Width: 1 ms
Lead Channel Setting Pacing Amplitude: 3.5 V
Lead Channel Setting Sensing Sensitivity: 0.5 mV
Lead Channel Setting Sensing Sensitivity: 1 mV
Pulse Gen Serial Number: 390312

## 2021-09-17 NOTE — Progress Notes (Signed)
ICD check status post AV node ablation, Lower rate programmed from 90 to 80bpm. Atrial threshold 0.4V'@0'$ .66m. ROV 09/30/21 with And Tillery. ?

## 2021-09-25 ENCOUNTER — Other Ambulatory Visit: Payer: Self-pay | Admitting: Internal Medicine

## 2021-09-30 ENCOUNTER — Encounter: Payer: Medicare Other | Admitting: Student

## 2021-10-14 ENCOUNTER — Encounter: Payer: Self-pay | Admitting: *Deleted

## 2021-10-14 NOTE — Progress Notes (Signed)
Entresto 24/26 mg approved from 07/23/2021-07/05/2022 ?

## 2021-10-17 ENCOUNTER — Ambulatory Visit: Payer: Medicare Other | Attending: Primary Care | Admitting: Pulmonary Disease

## 2021-10-17 DIAGNOSIS — G4733 Obstructive sleep apnea (adult) (pediatric): Secondary | ICD-10-CM | POA: Insufficient documentation

## 2021-10-17 DIAGNOSIS — Z9989 Dependence on other enabling machines and devices: Secondary | ICD-10-CM | POA: Insufficient documentation

## 2021-10-22 DIAGNOSIS — Z9989 Dependence on other enabling machines and devices: Secondary | ICD-10-CM | POA: Diagnosis not present

## 2021-10-22 DIAGNOSIS — G4733 Obstructive sleep apnea (adult) (pediatric): Secondary | ICD-10-CM

## 2021-10-22 NOTE — Procedures (Signed)
? ? ?  Patient Name: Tommy Riley, Tommy Riley ?Study Date: 10/17/2021 ?Gender: Male ?D.O.B: 10-04-1945 ?Age (years): 48 ?Referring Provider: Geraldo Pitter NP ?Height (inches): 72 ?Interpreting Physician: Chesley Mires MD, ABSM ?Weight (lbs): 266 ?RPSGT: Peak, Robert ?BMI: 36 ?MRN: [id number] ?Neck Size: <br> ? ?CLINICAL INFORMATION ?Sleep Study Type: NPSG ? ?Indication for sleep study: History of obstructive sleep apnea, COPD, chronic respiratory failure on 3 liters.  Presents for reassessment of obstructive sleep apnea. ? ?SLEEP STUDY TECHNIQUE ?As per the AASM Manual for the Scoring of Sleep and Associated Events v2.3 (April 2016) with a hypopnea requiring 4% desaturations. ? ?The channels recorded and monitored were frontal, central and occipital EEG, electrooculogram (EOG), submentalis EMG (chin), nasal and oral airflow, thoracic and abdominal wall motion, anterior tibialis EMG, snore microphone, electrocardiogram, and pulse oximetry. ? ?MEDICATIONS ?Medications self-administered by patient taken the night of the study : N/A ? ?SLEEP ARCHITECTURE ?The study was initiated at 10:08:14 PM and ended at 5:15:32 AM. ? ?Sleep onset time was 19.7 minutes and the sleep efficiency was 85.4%. The total sleep time was 365.1 minutes. ? ?Stage REM latency was N/A minutes. ? ?The patient spent 6.03% of the night in stage N1 sleep, 93.97% in stage N2 sleep, 0.00% in stage N3 and 0% in REM. ? ?Alpha intrusion was absent. ? ?Supine sleep was 100.00%. ? ?RESPIRATORY PARAMETERS ?The overall apnea/hypopnea index (AHI) was 8.5 per hour. There were 5 total apneas, including 4 obstructive, 1 central and 0 mixed apneas. There were 47 hypopneas and 15 RERAs. ? ?The AHI during Stage REM sleep was N/A per hour. ? ?AHI while supine was 8.5 per hour. ? ?The mean oxygen saturation was 93.37%. The minimum SpO2 during sleep was 83.00%. ? ?soft snoring was noted during this study. ? ?CARDIAC DATA ?The 2 lead EKG demonstrated sinus rhythm, pacemaker  generated. The mean heart rate was 79.68 beats per minute. Other EKG findings include: Ventricular Tachycardia. ? ?LEG MOVEMENT DATA ?The total PLMS were 0 with a resulting PLMS index of 0.00. Associated arousal with leg movement index was 0.0 . ? ?IMPRESSIONS ?- Mild obstructive sleep apnea occurred during this study (AHI = 8.5/h). ?- Mild oxygen desaturation was noted during this study (Min O2 = 83.00%).  The study was conducted without the use of supplemental oxygen ?- The patient snored with soft snoring volume. ?- EKG findings include 20 beat run of Ventricular Tachycardia during Epoch 286. ? ?DIAGNOSIS ?- Obstructive Sleep Apnea (G47.33) ? ?RECOMMENDATIONS ?- Additional therapies for obstructive sleep apnea include CPAP therapy, oral appliance, or surgical assessment. ?- Further cardiology assessment might be warranted to assess risk for recurrent ventricular tachycardia. ?- Avoid alcohol, sedatives and other CNS depressants that may worsen sleep apnea and disrupt normal sleep architecture. ?- Sleep hygiene should be reviewed to assess factors that may improve sleep quality. ?- Weight management and regular exercise should be initiated or continued if appropriate. ? ?[Electronically signed] 10/22/2021 08:48 AM ? ?Chesley Mires MD, ABSM ?Diplomate, Tax adviser of Sleep Medicine ?NPI: [id number] ? ?[place] ?PH: [phone]   FX: [phone] ?ACCREDITED BY THE AMERICAN ACADEMY OF SLEEP MEDICINE ? ?

## 2021-10-24 ENCOUNTER — Encounter: Payer: Self-pay | Admitting: Internal Medicine

## 2021-10-24 ENCOUNTER — Ambulatory Visit: Payer: Medicare Other | Admitting: Internal Medicine

## 2021-10-24 VITALS — BP 126/72 | HR 81 | Ht 71.5 in | Wt 271.4 lb

## 2021-10-24 DIAGNOSIS — I255 Ischemic cardiomyopathy: Secondary | ICD-10-CM | POA: Diagnosis not present

## 2021-10-24 DIAGNOSIS — I4821 Permanent atrial fibrillation: Secondary | ICD-10-CM

## 2021-10-24 DIAGNOSIS — Z95810 Presence of automatic (implantable) cardiac defibrillator: Secondary | ICD-10-CM | POA: Diagnosis not present

## 2021-10-24 NOTE — Patient Instructions (Addendum)
Medication Instructions:  ?Your physician recommends that you continue on your current medications as directed. Please refer to the Current Medication list given to you today. ? ?Labwork: ?None ordered. ? ?Testing/Procedures: ?None ordered. ? ?Follow-Up: ?Your physician wants you to follow-up in: one year with Tommy Peru, MD  ?You will receive a reminder letter in the mail two months in advance. If you don't receive a letter, please call our office to schedule the follow-up appointment. ? ?Remote monitoring is used to monitor your ICD from home. This monitoring reduces the number of office visits required to check your device to one time per year. It allows [place] to keep an eye on the functioning of your device to ensure it is working properly. You are scheduled for a device check from home on 11/06/2021. You may send your transmission at any time that day. If you have a wireless device, the transmission will be sent automatically. After your physician reviews your transmission, you will receive a postcard with your next transmission date. ? ?Any Other Special Instructions Will Be Listed Below (If Applicable). ? ?If you need a refill on your cardiac medications before your next appointment, please call your pharmacy.  ? ?Important Information About Sugar ? ? ? ? ? ? ? ?

## 2021-10-26 NOTE — Progress Notes (Signed)
? ? ? ? ?HPI ?Mr. Oehlert returns today for followup. He is a pleasant 76 yo man with an ICM, s/p remote CABG, perm atrial fib with a RVR, who underwent biv ICD insertion several months ago. His VR could not be controlled and he underwent AV node ablation a few weeks ago. He is better. He admits to dietary indiscretion. He admits to being sedentary.  ?Allergies  ?Allergen Reactions  ? Codeine Anaphylaxis, Shortness Of Breath and Other (See Comments)  ?  Tightness in chest ?  ? ? ? ?Current Outpatient Medications  ?Medication Sig Dispense Refill  ? ACCU-CHEK AVIVA PLUS test strip     ? albuterol (PROVENTIL) (2.5 MG/3ML) 0.083% nebulizer solution Take 3 mLs (2.5 mg total) by nebulization every 6 (six) hours as needed for wheezing or shortness of breath. 75 mL 4  ? albuterol (VENTOLIN HFA) 108 (90 Base) MCG/ACT inhaler USE 2 INHALATIONS BY MOUTH  EVERY 4 HOURS AS NEEDED FOR SHORTNESS OF BREATH OR  WHEEZE 51 g 3  ? allopurinol (ZYLOPRIM) 100 MG tablet Taking two tablets by mouth in the morning and 1 tablet by mouth in the evening    ? apixaban (ELIQUIS) 5 MG TABS tablet Take 1 tablet (5 mg total) by mouth 2 (two) times daily. Resume taking apixaban on 09/03/21 60 tablet 5  ? aspirin 81 MG EC tablet Take 81 mg by mouth in the morning.    ? Blood Glucose Monitoring Suppl (ONE TOUCH ULTRA 2) W/DEVICE KIT by Does not apply route.    ? Cholecalciferol (VITAMIN D3) 2000 UNITS TABS Take 1 capsule by mouth in the morning and at bedtime.    ? diphenhydramine-acetaminophen (TYLENOL PM) 25-500 MG TABS tablet Take 2 tablets by mouth at bedtime.    ? ezetimibe (ZETIA) 10 MG tablet Take 10 mg by mouth in the morning.    ? FARXIGA 10 MG TABS tablet Take 1 tablet (10 mg total) by mouth daily. 30 tablet 90  ? fluorouracil (EFUDEX) 5 % cream Apply 1 application topically in the morning, at noon, in the evening, and at bedtime.    ? Fluticasone-Umeclidin-Vilant (TRELEGY ELLIPTA) 100-62.5-25 MCG/INH AEPB Inhale 1 puff into the lungs daily.  Patient needs 90 day script with 3 refills. 300 each 3  ? folic acid (FOLVITE) 923 MCG tablet Take 400 mcg by mouth every evening.    ? HUMALOG KWIKPEN 200 UNIT/ML KwikPen 20 Units as needed. On a sliding scale    ? insulin detemir (LEVEMIR) 100 UNIT/ML injection Inject 50 Units into the skin 2 (two) times daily. On a sliding scale    ? loratadine (CLARITIN) 10 MG tablet Take 10 mg by mouth in the morning.    ? melatonin 5 MG TABS Take 5 mg by mouth at bedtime.    ? metoprolol (TOPROL-XL) 200 MG 24 hr tablet Take 1 tablet (200 mg total) by mouth daily. Take with or immediately following a meal. 90 tablet 3  ? Multiple Vitamins-Minerals (CENTRUM CARDIO) TABS Take 1 tablet by mouth in the morning.    ? omega-3 acid ethyl esters (LOVAZA) 1 g capsule Take 2 capsules (2 g total) by mouth 2 (two) times daily.    ? oxymetazoline (AFRIN) 0.05 % nasal spray Place 1 spray into both nostrils 2 (two) times daily.    ? potassium chloride SA (KLOR-CON M) 20 MEQ tablet Take 2 tablets (40 mEq total) by mouth 2 (two) times daily. 360 tablet 3  ? RELION PEN NEEDLE 31G/8MM  31G X 8 MM MISC 3 (three) times daily.    ? rosuvastatin (CRESTOR) 40 MG tablet Take 1 tablet (40 mg total) by mouth daily. 90 tablet 3  ? sacubitril-valsartan (ENTRESTO) 24-26 MG Take 1 tablet by mouth 2 (two) times daily. 180 tablet 3  ? sertraline (ZOLOFT) 100 MG tablet Take 150 mg by mouth in the morning.    ? spironolactone (ALDACTONE) 25 MG tablet Take 1 tablet (25 mg total) by mouth daily. 90 tablet 3  ? torsemide (DEMADEX) 20 MG tablet Take 4 tablets (80 mg total) by mouth 2 (two) times daily. May take an additional 20 mg tablet daily as needed for 2-3 lbs weight gain in one day or 5 lbs in 1 week 990 tablet 2  ? vitamin C (ASCORBIC ACID) 500 MG tablet Take 500 mg by mouth in the morning and at bedtime.    ? zinc sulfate 220 (50 Zn) MG capsule Take 220 mg by mouth every evening.    ? ?No current facility-administered medications for this visit.  ? ? ? ?Past  Medical History:  ?Diagnosis Date  ? Anxiety   ? Atrial fibrillation and flutter ([place])   ? [facility]rial fibrillation ablation [facility]  ? CAD in native artery   ? Chronic systolic heart failure ([place])   ? CKD (chronic kidney disease), stage III ([place])   ? COPD (chronic obstructive pulmonary disease) ([place])   ? COVID-19   ? August 2021  ? Depression   ? Essential hypertension   ? Gout   ? Hyperlipidemia   ? Ischemic heart disease   ? OSA (obstructive sleep apnea)   ? Stroke [facility]) 2003  ? Type 2 diabetes mellitus ([place])   ? ? ?ROS: ? ? All systems reviewed and negative except as noted in the HPI. ? ? ?Past Surgical History:  ?Procedure Laterality Date  ? ATRIAL FIBRILLATION ABLATION  2011  ? [facility]  ? AV NODE ABLATION N/A 05/07/2021  ? Procedure: AV NODE ABLATION;  Surgeon: Evans Lance, MD;  Location: [place] CV [facility];  Service: Cardiovascular;  Laterality: N/A;  ? AV NODE ABLATION N/A 09/01/2021  ? Procedure: AV NODE ABLATION;  Surgeon: Evans Lance, MD;  Location: [place] [facility];  Service: Cardiovascular;  Laterality: N/A;  ? CATARACT EXTRACTION Bilateral   ? CORONARY ARTERY BYPASS GRAFT  2001  ? HERNIA REPAIR Right   ? ICD IMPLANT N/A 05/07/2021  ? Procedure: ICD IMPLANT;  Surgeon: Evans Lance, MD;  Location: [place] [facility];  Service: Cardiovascular;  Laterality: N/A;  ? RIGHT/LEFT HEART CATH AND CORONARY/GRAFT ANGIOGRAPHY N/A 05/05/2021  ? Procedure: RIGHT/LEFT HEART CATH AND CORONARY/GRAFT ANGIOGRAPHY;  Surgeon: Lorretta Harp, MD;  Location: [place] [facility];  Service: Cardiovascular;  Laterality: N/A;  ? TONSILLECTOMY AND ADENOIDECTOMY    ? ? ? ?Family History  ?Problem Relation Age of Onset  ? Raynaud syndrome Mother   ? Stroke Father   ? Diabetes Mellitus II Father   ? Hypertension Father   ? Hypertension Sister   ? Diabetes Mellitus II Sister   ? Stroke Brother   ? Crohn's disease Brother   ? Breast cancer Brother   ? COPD Brother   ? ? ? ?Social History  ? ?Socioeconomic History  ? Marital  status: Married  ?  Spouse name: Eathan Groman  ? Number of children: 3  ? Years of education: 17  ? Highest education level: Some college, no degree  ?  Occupational History  ?  Comment: retired  ?Tobacco Use  ? Smoking status: Former  ?  Packs/day: 2.50  ?  Years: 50.00  ?  Pack years: 125.00  ?  Types: Cigarettes  ?  Quit date: 06/17/2011  ?  Years since quitting: 10.3  ? Smokeless tobacco: Never  ?Vaping Use  ? Vaping Use: Some days  ? Start date: 07/06/2017  ? Last attempt to quit: 04/19/2021  ? Substances: Flavoring  ?Substance and Sexual Activity  ? Alcohol use: No  ?  Comment: 1 beer a month  ? Drug use: No  ? Sexual activity: Not on file  ?Other Topics Concern  ? Not on file  ?Social History Narrative  ? Consumes no caffeine  ? ?Social Determinants of Health  ? ?Financial Resource Strain: Low Risk   ? Difficulty of Paying Living Expenses: Not hard at all  ?Food Insecurity: No Food Insecurity  ? Worried About Charity fundraiser in the Last Year: Never true  ? Ran Out of Food in the Last Year: Never true  ?Transportation Needs: No Transportation Needs  ? Lack of Transportation (Medical): No  ? Lack of Transportation (Non-Medical): No  ?Physical Activity: Not on file  ?Stress: Not on file  ?Social Connections: Not on file  ?Intimate Partner Violence: Not on file  ? ? ? ?BP 126/72   Pulse 81   Ht 5' 11.5" (1.816 m)   Wt 271 lb 6.4 oz (123.1 kg)   SpO2 98%   BMI 37.33 kg/m?  ? ?Physical Exam: ? ?Well appearing NAD ?HEENT: Unremarkable ?Neck:  No JVD, no thyromegally ?Lymphatics:  No adenopathy ?Back:  No CVA tenderness ?Lungs:  Clear with no wheezes ?HEART:  Regular rate rhythm, no murmurs, no rubs, no clicks ?Abd:  soft, positive bowel sounds, no organomegally, no rebound, no guarding ?Ext:  2 plus pulses, no edema, no cyanosis, no clubbing ?Skin:  No rashes no nodules ?Neuro:  CN II through XII intact, motor grossly intact ? ?EKG - atrial fib with ventricular pacing with a QRS duration of 124 ms. ? ?DEVICE   ?Normal [place] [employer] device function.  See PaceArt for details.  ? ?Assess/Plan:  ?Uncontrolled atrial fib - he is s/p AV node ablation and his VR is controlled.  ?ICD - today we reprogrammed his rate from 80

## 2021-11-06 ENCOUNTER — Ambulatory Visit (INDEPENDENT_AMBULATORY_CARE_PROVIDER_SITE_OTHER): Payer: Medicare Other

## 2021-11-06 DIAGNOSIS — I255 Ischemic cardiomyopathy: Secondary | ICD-10-CM

## 2021-11-06 LAB — CUP PACEART REMOTE DEVICE CHECK
Battery Remaining Longevity: 132 mo
Battery Remaining Percentage: 100 %
Brady Statistic RA Percent Paced: 98 %
Brady Statistic RV Percent Paced: 0 %
Date Time Interrogation Session: 20230504011700
HighPow Impedance: 59 Ohm
Implantable Lead Implant Date: 20221103
Implantable Lead Implant Date: 20221103
Implantable Lead Implant Date: 20221103
Implantable Lead Location: 753858
Implantable Lead Location: 753860
Implantable Lead Location: 753860
Implantable Lead Model: 138
Implantable Lead Model: 3830
Implantable Lead Model: 4671
Implantable Lead Serial Number: 304581
Implantable Lead Serial Number: 852093
Implantable Pulse Generator Implant Date: 20221103
Lead Channel Impedance Value: 547 Ohm
Lead Channel Impedance Value: 560 Ohm
Lead Channel Impedance Value: 564 Ohm
Lead Channel Setting Pacing Amplitude: 2.5 V
Lead Channel Setting Sensing Sensitivity: 0.5 mV
Lead Channel Setting Sensing Sensitivity: 1 mV
Pulse Gen Serial Number: 390312

## 2021-11-19 NOTE — Progress Notes (Signed)
Remote ICD transmission.   

## 2021-11-20 ENCOUNTER — Ambulatory Visit: Payer: Medicare Other | Admitting: Cardiology

## 2021-11-20 ENCOUNTER — Encounter: Payer: Self-pay | Admitting: Cardiology

## 2021-11-20 VITALS — BP 122/70 | HR 71 | Ht 71.0 in | Wt 272.6 lb

## 2021-11-20 DIAGNOSIS — I4821 Permanent atrial fibrillation: Secondary | ICD-10-CM | POA: Diagnosis not present

## 2021-11-20 DIAGNOSIS — I255 Ischemic cardiomyopathy: Secondary | ICD-10-CM

## 2021-11-20 DIAGNOSIS — I5020 Unspecified systolic (congestive) heart failure: Secondary | ICD-10-CM

## 2021-11-20 DIAGNOSIS — I272 Pulmonary hypertension, unspecified: Secondary | ICD-10-CM

## 2021-11-20 NOTE — Patient Instructions (Signed)

## 2021-11-20 NOTE — Progress Notes (Signed)
Cardiology Office Note  Date: 11/20/2021   ID: Tommy Riley, DOB [birth year], MRN [id number]  PCP:  Clinton Quant, MD  Cardiologist:  Rozann Lesches, MD Electrophysiologist:  Vickie Epley, MD   Chief Complaint  Patient presents with   Cardiac follow-up    History of Present Illness: Tommy Riley is a 76 y.o. male last seen in January.  He has had interval follow-up in the heart failure clinic and also with Dr. Lovena Le, I reviewed the notes.  He is here for a routine visit.  Overall stable with NYHA class II dyspnea, no significant weight gain on current diuretic regimen.  He does have a lot of trouble with peripheral neuropathy pain, using a cane today.  [employer] CRT-D in place with follow-up by Dr. Lovena Le.  Device was reprogrammed to AAIR at last device check.  He is status post AV node ablation in February as well.  I reviewed his medications which are stable from a cardiac perspective and outlined below.  He did have follow-up lab work in March.  We will plan on getting a repeat echocardiogram around the time of his next visit in the heart failure clinic.  Past Medical History:  Diagnosis Date   Anxiety    Atrial fibrillation and flutter ([place])    Atrial fibrillation ablation 2011 at [facility]   CAD in native artery    Chronic systolic heart failure (HCC)    CKD (chronic kidney disease), stage III (HCC)    COPD (chronic obstructive pulmonary disease) ([place])    COVID-22 February 2020   Depression    Essential hypertension    Gout    Hyperlipidemia    Ischemic heart disease    OSA (obstructive sleep apnea)    Stroke ([place]) 2003   Type 2 diabetes mellitus ([place])     Past Surgical History:  Procedure Laterality Date   ATRIAL FIBRILLATION ABLATION  2011   [facility]   AV NODE ABLATION N/A 05/07/2021   Procedure: AV NODE ABLATION;  Surgeon: Evans Lance, MD;  Location: [facility];  Service: Cardiovascular;  Laterality: N/A;   AV NODE ABLATION  N/A 09/01/2021   Procedure: AV NODE ABLATION;  Surgeon: Evans Lance, MD;  Location: [place] [facility];  Service: Cardiovascular;  Laterality: N/A;   CATARACT EXTRACTION Bilateral    CORONARY ARTERY BYPASS GRAFT  2001   HERNIA REPAIR Right    ICD IMPLANT N/A 05/07/2021   Procedure: ICD IMPLANT;  Surgeon: Evans Lance, MD;  Location: [facility];  Service: Cardiovascular;  Laterality: N/A;   RIGHT/LEFT HEART CATH AND CORONARY/GRAFT ANGIOGRAPHY N/A 05/05/2021   Procedure: RIGHT/LEFT HEART CATH AND CORONARY/GRAFT ANGIOGRAPHY;  Surgeon: Lorretta Harp, MD;  Location: [facility];  Service: Cardiovascular;  Laterality: N/A;   TONSILLECTOMY AND ADENOIDECTOMY      Current Outpatient Medications  Medication Sig Dispense Refill   ACCU-CHEK AVIVA PLUS test strip      albuterol (PROVENTIL) (2.5 MG/3ML) 0.083% nebulizer solution Take 3 mLs (2.5 mg total) by nebulization every 6 (six) hours as needed for wheezing or shortness of breath. 75 mL 4   albuterol (VENTOLIN HFA) 108 (90 Base) MCG/ACT inhaler USE 2 INHALATIONS BY MOUTH  EVERY 4 HOURS AS NEEDED FOR SHORTNESS OF BREATH OR  WHEEZE 51 g 3   allopurinol (ZYLOPRIM) 100 MG tablet Taking two tablets by mouth in the morning and 1 tablet by mouth in the  evening     apixaban (ELIQUIS) 5 MG TABS tablet Take 1 tablet (5 mg total) by mouth 2 (two) times daily. Resume taking apixaban on 09/03/21 60 tablet 5   aspirin 81 MG EC tablet Take 81 mg by mouth in the morning.     Blood Glucose Monitoring Suppl (ONE TOUCH ULTRA 2) W/DEVICE KIT by Does not apply route.     Cholecalciferol (VITAMIN D3) 2000 UNITS TABS Take 1 capsule by mouth in the morning and at bedtime.     diphenhydramine-acetaminophen (TYLENOL PM) 25-500 MG TABS tablet Take 2 tablets by mouth at bedtime.     ezetimibe (ZETIA) 10 MG tablet Take 10 mg by mouth in the morning.     FARXIGA 10 MG TABS tablet Take 1 tablet (10 mg total) by mouth daily. 30 tablet 90   fluorouracil  (EFUDEX) 5 % cream Apply 1 application topically in the morning, at noon, in the evening, and at bedtime.     Fluticasone-Umeclidin-Vilant (TRELEGY ELLIPTA) 100-62.5-25 MCG/INH AEPB Inhale 1 puff into the lungs daily. Patient needs 90 day script with 3 refills. 694 each 3   folic acid (FOLVITE) 854 MCG tablet Take 400 mcg by mouth every evening.     HUMALOG KWIKPEN 200 UNIT/ML KwikPen 20 Units as needed. On a sliding scale     insulin detemir (LEVEMIR) 100 UNIT/ML injection Inject 50 Units into the skin 2 (two) times daily. On a sliding scale     loratadine (CLARITIN) 10 MG tablet Take 10 mg by mouth in the morning.     melatonin 5 MG TABS Take 5 mg by mouth at bedtime.     metoprolol (TOPROL-XL) 200 MG 24 hr tablet Take 1 tablet (200 mg total) by mouth daily. Take with or immediately following a meal. 90 tablet 3   Multiple Vitamins-Minerals (CENTRUM CARDIO) TABS Take 1 tablet by mouth in the morning.     omega-3 acid ethyl esters (LOVAZA) 1 g capsule Take 2 capsules (2 g total) by mouth 2 (two) times daily.     oxymetazoline (AFRIN) 0.05 % nasal spray Place 1 spray into both nostrils 2 (two) times daily.     potassium chloride SA (KLOR-CON M) 20 MEQ tablet Take 2 tablets (40 mEq total) by mouth 2 (two) times daily. 360 tablet 3   RELION PEN NEEDLE 31G/8MM 31G X 8 MM MISC 3 (three) times daily.     rosuvastatin (CRESTOR) 40 MG tablet Take 1 tablet (40 mg total) by mouth daily. 90 tablet 3   sacubitril-valsartan (ENTRESTO) 24-26 MG Take 1 tablet by mouth 2 (two) times daily. 180 tablet 3   sertraline (ZOLOFT) 100 MG tablet Take 150 mg by mouth in the morning.     spironolactone (ALDACTONE) 25 MG tablet Take 1 tablet (25 mg total) by mouth daily. 90 tablet 3   torsemide (DEMADEX) 20 MG tablet Take 4 tablets (80 mg total) by mouth 2 (two) times daily. May take an additional 20 mg tablet daily as needed for 2-3 lbs weight gain in one day or 5 lbs in 1 week 990 tablet 2   vitamin C (ASCORBIC ACID) 500  MG tablet Take 500 mg by mouth in the morning and at bedtime.     zinc sulfate 220 (50 Zn) MG capsule Take 220 mg by mouth every evening.     No current facility-administered medications for this visit.   Allergies:  Codeine   ROS: Palpitations or syncope.  Physical Exam: VS:  BP  122/70   Pulse 71   Ht 5' 11"  (1.803 m)   Wt 272 lb 9.6 oz (123.7 kg)   SpO2 92%   BMI 38.02 kg/m , BMI Body mass index is 38.02 kg/m.  Wt Readings from Last 3 Encounters:  11/20/21 272 lb 9.6 oz (123.7 kg)  10/24/21 271 lb 6.4 oz (123.1 kg)  09/15/21 273 lb 6.4 oz (124 kg)    General: Patient appears comfortable at rest. HEENT: Conjunctiva and lids normal. Neck: Supple, no elevated JVP or carotid bruits, no thyromegaly. Lungs: Clear to auscultation, nonlabored breathing at rest. Cardiac: Regular rate and rhythm, no S3 or significant systolic murmur, no pericardial rub. Extremities: Stable, chronic appearing edema with compression stockings in place.  ECG:  An ECG dated 10/24/2021 was personally reviewed today and demonstrated:  Ventricular pacing with underlying atrial fibrillation.  Recent Labwork: 04/14/2021: TSH 0.541 05/01/2021: ALT 43; AST 41 08/26/2021: Pro B Natriuretic peptide (BNP) 353.0 08/29/2021: Hemoglobin 14.1; Platelets 120 09/01/2021: Magnesium 2.2 09/15/2021: B Natriuretic Peptide 258.7; BUN 17; Creatinine, Ser 1.20; Potassium 3.7; Sodium 146   Other Studies Reviewed Today:  Echocardiogram 04/14/2021:  1. Left ventricular ejection fraction, by estimation, is 35 to 40%. The  left ventricle has moderately decreased function. The left ventricle  demonstrates global hypokinesis. There is mild left ventricular  hypertrophy. Left ventricular diastolic  parameters are indeterminate. In Afib with RVR during study, consider  repeat limited echo to better evaluate systolic function once rates better  controlled.   2. Right ventricular systolic function is mildly reduced. The right   ventricular size is normal. There is severely elevated pulmonary artery  systolic pressure. The estimated right ventricular systolic pressure is  46.9 mmHg.   3. Left atrial size was severely dilated.   4. Right atrial size was severely dilated.   5. The mitral valve is normal in structure. Trivial mitral valve  regurgitation.   6. The aortic valve is tricuspid. Aortic valve regurgitation is not  visualized. No aortic stenosis is present.   7. The inferior vena cava is dilated in size with <50% respiratory  variability, suggesting right atrial pressure of 15 mmHg.    Cardiac MRI 05/07/2021: IMPRESSION: 1. Mixed cardiomyopathy with evidence of ischemic and nonischemic etiologies   2. Subendocardial late gadolinium enhancement consistent with prior infarcts at apex, basal to mid inferolateral, and mid anterolateral walls. LGE is greater than 50% transmural at the apex, suggesting nonviability. Remainder of myocardium appears viable.   3. Basal septal midwall LGE, which is a scar pattern seen in nonischemic cardiomyopathies and associated with a worse prognosis. Also with mid myocardial LGE in basal anterior wall   4. Elevated myocardial T2 values (up to 70m in basal inferior wall), suggesting myocardial edema   5. Normal LV size with moderate systolic dysfunction (EF 362%. Septal dyskinesis consistent with LBBB.   6.  Normal RV size and systolic function (EF 595%   7.  Dilated ascending aorta measuring 419m Assessment and Plan:  1.  HFrEF with mixed cardiomyopathy, LVEF 35 to 40%.  Clinically stable in terms of weight and NYHA class II dyspnea.  Plan follow-up echocardiogram in September.  Recent lab work reviewed.  Continue Toprol-XL, Aldactone, Farxiga, Demadex with potassium supplement, and Entresto.  2.  Permanent atrial fibrillation status post AV node ablation.  He has a BoChemical engineerRT-D in place with followed by Dr. TaLovena Le CHA2DS2-VASc score is 7 and he  remains on Eliquis for stroke prophylaxis.  3.  Multivessel CAD status post CABG with patent bypass grafts by most recent evaluation and no significant angina at this time.  Continue low-dose aspirin and Crestor.  4.  Pulmonary hypertension, WHO group 2 and 3.  Continue fluid management and treatment of OSA.  Medication Adjustments/Labs and Tests Ordered: Current medicines are reviewed at length with the patient today.  Concerns regarding medicines are outlined above.   Tests Ordered: Orders Placed This Encounter  Procedures   ECHOCARDIOGRAM COMPLETE    Medication Changes: No orders of the defined types were placed in this encounter.   Disposition:  Follow up  6 months.  Signed, Satira Sark, MD, [facility] 11/20/2021 11:28 AM    [facility] [place], [place],  [address] Phone: [phone]; Fax: [phone]

## 2021-12-02 ENCOUNTER — Other Ambulatory Visit: Payer: Self-pay | Admitting: Physician Assistant

## 2021-12-03 NOTE — Telephone Encounter (Signed)
Prescription refill request for Eliquis received. Indication:Afib Last office visit:5/23 Scr:1.2 Age: 76 Weight:123.7 kg  Prescription refilled

## 2021-12-10 ENCOUNTER — Telehealth: Payer: Self-pay | Admitting: Emergency Medicine

## 2021-12-11 NOTE — Telephone Encounter (Signed)
Yes I am ok with him seeing Dr Halford Chessman to establish care - whenever [facility] has an opening for a new patient

## 2021-12-11 NOTE — Telephone Encounter (Signed)
Called and spoke with patient, he is requesting a prescription for his Trelegy 100 90 day supply with 3 refills to send to [facility] since he gets his medication through their financial assistance program.  He states that our office usually mails the script to him.  I advised that we generally fax the application and the script from our office.  He stated again that we always mail it to his home.  I verified his home address and let him know that Dr. Brock Ra would be back in the office and we would have him sign it at that time.  He was last seen in February of this year and the last OV did not indicate when for him to f/u.  Advised I would get clarification from Dr. Lamonte Sakai on when he needs to f/u in the office.  He lives in [place], [place] and is looking at getting his doctors closer to where he lives, his cardiologist recommended that he see Dr. Halford Chessman in [place].  I advised him that I would make Dr. Lamonte Sakai aware of the situation/switch and once we heard back from him we would call him back.  He verbalized understanding.  Dr. Lamonte Sakai, Please advise on when this patient needs to be seen again in the office (nothing in last OV note).  He would like to see Dr. Halford Chessman in [place] as that office is closer to his home in [place].  Please advise of it is ok to switch providers.  Thank you.  Dr. Halford Chessman, Are you agreeable to seeing this patient in the [place] office?  Please advise.  Thank you.

## 2021-12-11 NOTE — Telephone Encounter (Signed)
I am okay for him to switch care to [facility] office.

## 2021-12-12 NOTE — Telephone Encounter (Signed)
Called and spoke with patient. Advised patient that Dr. Lamonte Sakai and Dr. Halford Chessman are okay with him switching to the [facility] office. Also advised patient that when Dr. Lamonte Sakai is back in office, we will get him to sign the prescription that needs to be faxed with the [place] application.   Patient verbalized understanding. Nothing further needed.

## 2021-12-23 ENCOUNTER — Telehealth: Payer: Self-pay | Admitting: Emergency Medicine

## 2021-12-25 MED ORDER — TRELEGY ELLIPTA 100-62.5-25 MCG/ACT IN AEPB: 1.0000 | INHALATION_SPRAY | Freq: Every day | RESPIRATORY_TRACT | 6 refills | Status: DC

## 2021-12-25 NOTE — Telephone Encounter (Signed)
Called patient and spoke to wife and was informed that they are needing a hard script for the Trelegy inhaler for Tommy Riley assistance paperwork. They live in [place] and unable to drop paperwork off and she states it is like 40 pages of information for their income. Dr Lamonte Sakai is out of the office so waiting to here from DOD Dr Verlee Monte to see if he would be willing to sign the scrip since Dr Lamonte Sakai is out.

## 2021-12-25 NOTE — Telephone Encounter (Signed)
Dr Verlee Monte agreed to sign the script that was printed out for patient to have with his [place] assistance paperwork. Will send in the mail for patient once script is received. Nothing further needed.

## 2022-01-30 ENCOUNTER — Other Ambulatory Visit: Payer: Self-pay | Admitting: Cardiology

## 2022-02-05 ENCOUNTER — Ambulatory Visit (INDEPENDENT_AMBULATORY_CARE_PROVIDER_SITE_OTHER): Payer: Medicare Other

## 2022-02-05 DIAGNOSIS — I255 Ischemic cardiomyopathy: Secondary | ICD-10-CM

## 2022-02-05 LAB — CUP PACEART REMOTE DEVICE CHECK
Battery Remaining Longevity: 132 mo
Battery Remaining Percentage: 100 %
Brady Statistic RA Percent Paced: 98 %
Brady Statistic RV Percent Paced: 0 %
Date Time Interrogation Session: 20230803011700
HighPow Impedance: 62 Ohm
Implantable Lead Implant Date: 20221103
Implantable Lead Implant Date: 20221103
Implantable Lead Implant Date: 20221103
Implantable Lead Location: 753858
Implantable Lead Location: 753860
Implantable Lead Location: 753860
Implantable Lead Model: 138
Implantable Lead Model: 3830
Implantable Lead Model: 4671
Implantable Lead Serial Number: 304581
Implantable Lead Serial Number: 852093
Implantable Pulse Generator Implant Date: 20221103
Lead Channel Impedance Value: 530 Ohm
Lead Channel Impedance Value: 545 Ohm
Lead Channel Impedance Value: 573 Ohm
Lead Channel Setting Pacing Amplitude: 2.5 V
Lead Channel Setting Sensing Sensitivity: 0.5 mV
Lead Channel Setting Sensing Sensitivity: 1 mV
Pulse Gen Serial Number: 390312

## 2022-02-13 ENCOUNTER — Other Ambulatory Visit: Payer: Self-pay | Admitting: *Deleted

## 2022-02-13 MED ORDER — POTASSIUM CHLORIDE CRYS ER 20 MEQ PO TBCR: 40.0000 meq | EXTENDED_RELEASE_TABLET | Freq: Two times a day (BID) | ORAL | 3 refills | Status: DC

## 2022-02-15 ENCOUNTER — Other Ambulatory Visit: Payer: Self-pay | Admitting: Cardiology

## 2022-02-26 NOTE — Progress Notes (Signed)
Remote ICD transmission.   

## 2022-03-02 ENCOUNTER — Encounter (HOSPITAL_COMMUNITY): Payer: Self-pay

## 2022-03-02 ENCOUNTER — Other Ambulatory Visit: Payer: Self-pay

## 2022-03-02 ENCOUNTER — Emergency Department (HOSPITAL_COMMUNITY): Payer: Medicare Other

## 2022-03-02 ENCOUNTER — Emergency Department (HOSPITAL_COMMUNITY)
Admission: EM | Admit: 2022-03-02 | Discharge: 2022-03-02 | Disposition: A | Discharge status: Home / Self Care | Payer: Medicare Other | Attending: Emergency Medicine | Admitting: Emergency Medicine

## 2022-03-02 DIAGNOSIS — T148XXA Other injury of unspecified body region, initial encounter: Secondary | ICD-10-CM

## 2022-03-02 DIAGNOSIS — J449 Chronic obstructive pulmonary disease, unspecified: Secondary | ICD-10-CM | POA: Diagnosis not present

## 2022-03-02 DIAGNOSIS — Z7901 Long term (current) use of anticoagulants: Secondary | ICD-10-CM | POA: Insufficient documentation

## 2022-03-02 DIAGNOSIS — I110 Hypertensive heart disease with heart failure: Secondary | ICD-10-CM | POA: Diagnosis not present

## 2022-03-02 DIAGNOSIS — Z794 Long term (current) use of insulin: Secondary | ICD-10-CM | POA: Diagnosis not present

## 2022-03-02 DIAGNOSIS — E119 Type 2 diabetes mellitus without complications: Secondary | ICD-10-CM | POA: Diagnosis not present

## 2022-03-02 DIAGNOSIS — Z79899 Other long term (current) drug therapy: Secondary | ICD-10-CM | POA: Diagnosis not present

## 2022-03-02 DIAGNOSIS — M25561 Pain in right knee: Secondary | ICD-10-CM | POA: Insufficient documentation

## 2022-03-02 DIAGNOSIS — M7981 Nontraumatic hematoma of soft tissue: Secondary | ICD-10-CM | POA: Insufficient documentation

## 2022-03-02 DIAGNOSIS — I509 Heart failure, unspecified: Secondary | ICD-10-CM | POA: Insufficient documentation

## 2022-03-02 DIAGNOSIS — Z7982 Long term (current) use of aspirin: Secondary | ICD-10-CM | POA: Diagnosis not present

## 2022-03-02 MED ORDER — DOXYCYCLINE HYCLATE 100 MG PO TABS
100.0000 mg | ORAL_TABLET | Freq: Once | ORAL | Status: AC
  Administered 2022-03-02: 100 mg via ORAL
  Filled 2022-03-02: qty 1

## 2022-03-02 MED ORDER — POVIDONE-IODINE 10 % EX SOLN
CUTANEOUS | Status: DC | PRN
  Filled 2022-03-02: qty 14.8

## 2022-03-02 MED ORDER — LIDOCAINE HCL (PF) 2 % IJ SOLN
INTRAMUSCULAR | Status: AC
  Filled 2022-03-02: qty 5

## 2022-03-02 MED ORDER — DOXYCYCLINE HYCLATE 100 MG PO CAPS: 100.0000 mg | ORAL_CAPSULE | Freq: Two times a day (BID) | ORAL | 0 refills | Status: DC

## 2022-03-02 MED ORDER — LIDOCAINE HCL (PF) 2 % IJ SOLN: 5.0000 mL | Freq: Once | INTRAMUSCULAR | Status: DC

## 2022-03-02 NOTE — Discharge Instructions (Signed)
It appears you have a deep bruise at your right knee, there is no obvious abscess at the site but the redness does suggest possible cellulitis.  You are being placed on antibiotics, you received your first dose here, take your next dose tomorrow morning.  Elevation and warm compresses can help with your pain and redness.  Also recommend twice daily Epsom salt compresses to the site and then application of a new dressing.  Once the incision site has closed, you may apply Vaseline or antibiotic ointment of choice to the site as well.

## 2022-03-02 NOTE — ED Triage Notes (Signed)
Patient reports fall this past Wednesday landing on right knee. States that he thought it was improving but pain is worse today. Noted with bruising to knee.

## 2022-03-02 NOTE — ED Provider Notes (Signed)
[facility] EMERGENCY DEPARTMENT Provider Note   CSN: [id number] Arrival date & time: 03/02/22  1508     History {Add pertinent medical, surgical, social history, OB history to HPI:1} Chief Complaint  Patient presents with  . Knee Pain    Tommy Riley is a 76 y.o. male with a history including COPD, hypertension, history of CHF, type 2 diabetes history of atrial fibrillation on daily Eliquis presenting for evaluation of right knee pain and swelling along with bruising which is triggered by a fall which occurred 5 days ago.  He reports tripping last Wednesday and landed directly on the right knee which was bent in flexion.  He used ice, elevation and minimize weightbearing and thought his symptoms were improving until he developed increased pain, swelling, redness and worsening bruising at the site over the past 2 days.  Reports having history of cellulitis in this leg and prior hematoma in his calf which "burst" resulting in chronic deformity and weakness in the calf.    The history is provided by the patient.       Home Medications Prior to Admission medications   Medication Sig Start Date End Date Taking? Authorizing Provider  ACCU-CHEK AVIVA PLUS test strip  07/09/14   [provider]  albuterol (PROVENTIL) (2.5 MG/3ML) 0.083% nebulizer solution Take 3 mLs (2.5 mg total) by nebulization every 6 (six) hours as needed for wheezing or shortness of breath. 08/26/21   Martyn Ehrich, NP  albuterol (VENTOLIN HFA) 108 (90 Base) MCG/ACT inhaler USE 2 INHALATIONS BY MOUTH  EVERY 4 HOURS AS NEEDED FOR SHORTNESS OF BREATH OR  WHEEZE 06/23/21   Collene Gobble, MD  allopurinol (ZYLOPRIM) 100 MG tablet Taking two tablets by mouth in the morning and 1 tablet by mouth in the evening    [provider]  apixaban (ELIQUIS) 5 MG TABS tablet Take 1 tablet by mouth twice daily 12/03/21   Almyra Deforest, PA  aspirin 81 MG EC tablet Take 81 mg by mouth in the morning.    [provider]  Blood Glucose Monitoring Suppl (ONE TOUCH ULTRA 2) W/DEVICE KIT by Does not apply route.    [provider]  Cholecalciferol (VITAMIN D3) 2000 UNITS TABS Take 1 capsule by mouth in the morning and at bedtime.    [provider]  diphenhydramine-acetaminophen (TYLENOL PM) 25-500 MG TABS tablet Take 2 tablets by mouth at bedtime.    [provider]  ezetimibe (ZETIA) 10 MG tablet Take 10 mg by mouth in the morning.    [provider]  FARXIGA 10 MG TABS tablet Take 1 tablet (10 mg total) by mouth daily. 12/27/20   Satira Sark, MD  fluorouracil (EFUDEX) 5 % cream Apply 1 application topically in the morning, at noon, in the evening, and at bedtime.    [provider]  Fluticasone-Umeclidin-Vilant (TRELEGY ELLIPTA) 100-62.5-25 MCG/ACT AEPB Inhale 1 puff into the lungs daily. 12/25/21   Maryjane Hurter, MD  Fluticasone-Umeclidin-Vilant (TRELEGY ELLIPTA) 100-62.5-25 MCG/INH AEPB Inhale 1 puff into the lungs daily. Patient needs 90 day script with 3 refills. 04/22/21   Deneise Lever, MD  folic acid (FOLVITE) 875 MCG tablet Take 400 mcg by mouth every evening.    [provider]  HUMALOG KWIKPEN 200 UNIT/ML KwikPen 20 Units as needed. On a sliding scale 09/12/21   [provider]  insulin detemir (LEVEMIR) 100 UNIT/ML injection Inject 50 Units into the skin 2 (two) times daily. On a  sliding scale    [provider]  loratadine (CLARITIN) 10 MG tablet Take 10 mg by mouth in the morning.    [provider]  melatonin 5 MG TABS Take 5 mg by mouth at bedtime.    [provider]  metoprolol (TOPROL-XL) 200 MG 24 hr tablet TAKE 1 TABLET BY MOUTH DAILY  WITH OR IMMEDIATELY FOLLOWING A  MEAL 02/16/22   Satira Sark, MD  Multiple Vitamins-Minerals (CENTRUM CARDIO) TABS Take 1 tablet by mouth in the morning.    [provider]  omega-3 acid ethyl esters (LOVAZA) 1 g capsule Take 2 capsules  (2 g total) by mouth 2 (two) times daily. 05/09/21   Almyra Deforest, PA  oxymetazoline (AFRIN) 0.05 % nasal spray Place 1 spray into both nostrils 2 (two) times daily.    [provider]  potassium chloride SA (KLOR-CON M) 20 MEQ tablet Take 2 tablets (40 mEq total) by mouth 2 (two) times daily. 02/13/22   Satira Sark, MD  RELION PEN NEEDLE 31G/8MM 31G X 8 MM MISC 3 (three) times daily. 08/31/21   [provider]  rosuvastatin (CRESTOR) 40 MG tablet TAKE 1 TABLET BY MOUTH DAILY 02/02/22   Satira Sark, MD  sacubitril-valsartan (ENTRESTO) 24-26 MG Take 1 tablet by mouth 2 (two) times daily. 07/22/21   Satira Sark, MD  sertraline (ZOLOFT) 100 MG tablet Take 150 mg by mouth in the morning. 08/04/21   [provider]  spironolactone (ALDACTONE) 25 MG tablet TAKE 1 TABLET BY MOUTH DAILY 02/02/22   Satira Sark, MD  torsemide (DEMADEX) 20 MG tablet Take 4 tablets (80 mg total) by mouth 2 (two) times daily. May take an additional 20 mg tablet daily as needed for 2-3 lbs weight gain in one day or 5 lbs in 1 week 09/03/21   Satira Sark, MD  vitamin C (ASCORBIC ACID) 500 MG tablet Take 500 mg by mouth in the morning and at bedtime.    [provider]  zinc sulfate 220 (50 Zn) MG capsule Take 220 mg by mouth every evening.    [provider]      Allergies    Codeine    Review of Systems   Review of Systems  Constitutional:  Negative for chills and fever.  Musculoskeletal:  Positive for arthralgias and joint swelling. Negative for myalgias.  Skin:  Positive for color change. Negative for wound.  Neurological:  Negative for weakness and numbness.  All other systems reviewed and are negative.   Physical Exam Updated Vital Signs BP 126/60   Pulse 71   Temp 98.2 F (36.8 C)   Resp 18   SpO2 97%  Physical Exam Constitutional:      Appearance: He is well-developed.  HENT:     Head: Atraumatic.  Cardiovascular:     Comments: Pulses  equal bilaterally Musculoskeletal:        General: Swelling and tenderness present. No deformity.     Cervical back: Normal range of motion.  Skin:    General: Skin is warm and dry.     Findings: Bruising and erythema present.     Comments: Suprapatella bruising and swelling noted with erythemand increased warmth right medial lower thigh.    Neurological:     General: No focal deficit present.     Mental Status: He is alert and oriented to person, place, and time.     Sensory: No sensory deficit.     Motor:  No weakness.     Deep Tendon Reflexes: Reflexes normal.    ED Results / Procedures / Treatments   Labs (all labs ordered are listed, but only abnormal results are displayed) Labs Reviewed - No data to display  EKG None  Radiology DG Knee Complete 4 Views Right  Result Date: 03/02/2022 CLINICAL DATA:  Knee pain and bruising since falling 5 days ago. EXAM: RIGHT KNEE - COMPLETE 4+ VIEW COMPARISON:  None Available. FINDINGS: The mineralization and alignment are normal. There is no evidence of acute fracture or dislocation. The joint spaces are preserved. No evidence of joint effusion. Focal soft tissue swelling anterior to the patellar and the patellar tendon, best seen on the lateral view, most consistent with a hematoma. Probable diffuse soft tissue swelling in the distal thigh. Femoropopliteal atherosclerosis noted. IMPRESSION: Probable anterior soft tissue hematoma. No evidence of acute fracture or joint effusion. Electronically Signed   By: Richardean Sale M.D.   On: 03/02/2022 16:30    Procedures Procedures  {Document cardiac monitor, telemetry assessment procedure when appropriate:1}  Medications Ordered in ED Medications - No data to display  ED Course/ Medical Decision Making/ A&P                           Medical Decision Making Amount and/or Complexity of Data Reviewed Radiology: ordered.   ***  {Document critical care time when appropriate:1} {Document  review of labs and clinical decision tools ie heart score, Chads2Vasc2 etc:1}  {Document your independent review of radiology images, and any outside records:1} {Document your discussion with family members, caretakers, and with consultants:1} {Document social determinants of health affecting pt's care:1} {Document your decision making why or why not admission, treatments were needed:1} Final Clinical Impression(s) / ED Diagnoses Final diagnoses:  None    Rx / DC Orders ED Discharge Orders     None

## 2022-03-12 ENCOUNTER — Encounter: Payer: Self-pay | Admitting: Pulmonary Disease

## 2022-03-12 ENCOUNTER — Ambulatory Visit: Payer: Medicare Other | Admitting: Pulmonary Disease

## 2022-03-12 VITALS — BP 138/90 | HR 74 | Temp 98.6°F | Ht 71.0 in | Wt 280.4 lb

## 2022-03-12 DIAGNOSIS — J449 Chronic obstructive pulmonary disease, unspecified: Secondary | ICD-10-CM

## 2022-03-12 DIAGNOSIS — J9611 Chronic respiratory failure with hypoxia: Secondary | ICD-10-CM

## 2022-03-12 DIAGNOSIS — G4733 Obstructive sleep apnea (adult) (pediatric): Secondary | ICD-10-CM | POA: Diagnosis not present

## 2022-03-12 NOTE — Patient Instructions (Signed)
Will have your CPAP setting changed to 14 cm water pressure  Will arrange for a flutter valve  Follow up in 6 months

## 2022-03-12 NOTE — Progress Notes (Signed)
[facility]  Chief Complaint  Patient presents with   New Patient (Initial Visit)    Switch from Dr. Lamonte Sakai for [place] and SOB. CPAP working well.     Past Surgical History:  He  has a past surgical history that includes Coronary artery bypass graft (2001); Cataract extraction (Bilateral); Tonsillectomy and adenoidectomy; Hernia repair (Right); ATRIAL FIBRILLATION ABLATION (2011); RIGHT/LEFT HEART CATH AND CORONARY/GRAFT ANGIOGRAPHY (N/A, 05/05/2021); ICD IMPLANT (N/A, 05/07/2021); AV NODE ABLATION (N/A, 05/07/2021); and AV NODE ABLATION (N/A, 09/01/2021).  Past Medical History:  Anxiety, A fib, CAD s/p CABG, Systolic CHF, CKD 3, COVID August 2021, Depression, HTN, Gout, HLD, CVA, DM type 2  Constitutional:  BP (!) 138/90 (BP Location: Right Arm, Patient Position: Sitting) Comment: < 5 min after walking from lobby to room  Pulse 74   Temp 98.6 F (37 C) (Temporal)   Ht _0  (1.803 m)   Wt 280 lb 6.4 oz (127.2 kg)   SpO2 96% Comment: 3LO2 pulse  BMI 39.11 kg/m   Brief Summary:  Tommy Riley is a 76 y.o. male former smoker with COPD, obstructive sleep apnea and chronic respiratory failure.      Subjective:   He is here with his wife.  Previously seen by Dr. Lamonte Sakai.  He uses CPAP nightly.  No issues with mask fit.  Gets dry mouth.  Has cough with chest congestion.  Hard for him to bring up phlegm.  He has been using mucinex.  Activity is limited due to neuropathic pain in his legs.  Uses 3 liters oxygen 24/7.  He plans to get flu and pneumococcal vaccines in October through his PCP.  He isn't sure he will get the COVID booster.   Physical Exam:   Appearance - well kempt, wearing oxygen  ENMT - no sinus tenderness, no oral exudate, no LAN, Mallampati 3 airway, no stridor  Respiratory - decreased breath sounds bilaterally, no wheezing or rales  CV - s1s2 regular rate and rhythm, no murmurs  Ext - no clubbing, no  edema  Skin - no rashes  Psych - normal mood and affect   Pulmonary testing:  PFT 04/28/17 >> FEV1 1.65 (46%), FEV1% 55, TLC 5.93 (82%), DLCO 67%  Chest Imaging:  HRCT chest 04/24/20 >> scarring at bases  Sleep Tests:  PSG 10/17/21 >> AHI 8.5, SpO2 low 83% CPAP 02/08/22 to 03/09/22 >> used on 30 of 30 nights with average 9 hrs 2 min.  Average AHI 0.6 with CPAP 17 cm H2O  Cardiac Tests:  Echo 04/14/21 >> EF 35 to 40%, RVSP 66 mmHg, severe LA/RA dilation  Social History:  He  reports that he quit smoking about 10 years ago. His smoking use included cigarettes. He has a 125.00 pack-year smoking history. He has never used smokeless tobacco. He reports that he does not drink alcohol and does not use drugs.  Family History:  His family history includes Breast cancer in his brother; COPD in his brother; Crohn's disease in his brother; Diabetes Mellitus II in his father and sister; Hypertension in his father and sister; Raynaud syndrome in his mother; Stroke in his brother and father.     Assessment/Plan:   COPD with chronic bronchitis. - continue trelegy 100 one puff daily - prn albuterol - he has a nebulizer - prn mucinex - will have him try a flutter valve also  Obstructive sleep apnea. - he is compliant with CPAP and reports benefit from therapy -  he uses [facility] for his DME - current CPAP ordered February 2018 - he is having mouth dryness; will change his CPAP to 14 cm H2O  Chronic respiratory failure with hypoxia. - uses 3 liters oxygen 24/7 - he has an Inogen POC he purchased on his own - gets oxygen supplies also through [place]  HFrEF, Permanent Atrial Fibrillation, CAD s/p CABG. - followed by Dr. Johnny Bridge with cardiology  CKD from cardiorenal syndrome. - followed by [facility] in [place]  Time Spent Involved in Patient Care on Day of Examination:  37 minutes  Follow up:   Patient Instructions  Will have your CPAP setting changed to  14 cm water pressure  Will arrange for a flutter valve  Follow up in 6 months  Medication List:   Allergies as of 03/12/2022       Reactions   Codeine Anaphylaxis, Shortness Of Breath, Other (See Comments)   Tightness in chest        Medication List        Accurate as of March 12, 2022  9:30 AM. If you have any questions, ask your nurse or doctor.          STOP taking these medications    doxycycline 100 MG capsule Commonly known as: VIBRAMYCIN Stopped by: Chesley Mires, MD       TAKE these medications    Accu-Chek Aviva Plus test strip Generic drug: glucose blood   albuterol 108 (90 Base) MCG/ACT inhaler Commonly known as: VENTOLIN HFA USE 2 INHALATIONS BY MOUTH  EVERY 4 HOURS AS NEEDED FOR SHORTNESS OF BREATH OR  WHEEZE   albuterol (2.5 MG/3ML) 0.083% nebulizer solution Commonly known as: PROVENTIL Take 3 mLs (2.5 mg total) by nebulization every 6 (six) hours as needed for wheezing or shortness of breath.   allopurinol 100 MG tablet Commonly known as: ZYLOPRIM Taking two tablets by mouth in the morning and 1 tablet by mouth in the evening   ascorbic acid 500 MG tablet Commonly known as: VITAMIN C Take 500 mg by mouth in the morning and at bedtime.   aspirin EC 81 MG tablet Take 81 mg by mouth in the morning.   Centrum Cardio Tabs Take 1 tablet by mouth in the morning.   diphenhydramine-acetaminophen 25-500 MG Tabs tablet Commonly known as: TYLENOL PM Take 2 tablets by mouth at bedtime.   Eliquis 5 MG Tabs tablet Generic drug: apixaban Take 1 tablet by mouth twice daily   Entresto 24-26 MG Generic drug: sacubitril-valsartan Take 1 tablet by mouth 2 (two) times daily.   ezetimibe 10 MG tablet Commonly known as: ZETIA Take 10 mg by mouth in the morning.   Farxiga 10 MG Tabs tablet Generic drug: dapagliflozin propanediol Take 1 tablet (10 mg total) by mouth daily.   fluorouracil 5 % cream Commonly known as: EFUDEX Apply 1 application  topically in the morning, at noon, in the evening, and at bedtime.   folic acid 625 MCG tablet Commonly known as: FOLVITE Take 400 mcg by mouth every evening.   HumaLOG KwikPen 200 UNIT/ML KwikPen Generic drug: insulin lispro 20 Units as needed. On a sliding scale   insulin detemir 100 UNIT/ML injection Commonly known as: LEVEMIR Inject 50 Units into the skin 2 (two) times daily. On a sliding scale   loratadine 10 MG tablet Commonly known as: CLARITIN Take 10 mg by mouth in the morning.   melatonin 5 MG Tabs Take 5 mg by mouth at bedtime.  metoprolol 200 MG 24 hr tablet Commonly known as: TOPROL-XL TAKE 1 TABLET BY MOUTH DAILY  WITH OR IMMEDIATELY FOLLOWING A  MEAL   omega-3 acid ethyl esters 1 g capsule Commonly known as: LOVAZA Take 2 capsules (2 g total) by mouth 2 (two) times daily.   ONE TOUCH ULTRA 2 w/Device Kit by Does not apply route.   oxymetazoline 0.05 % nasal spray Commonly known as: AFRIN Place 1 spray into both nostrils 2 (two) times daily.   potassium chloride SA 20 MEQ tablet Commonly known as: KLOR-CON M Take 2 tablets (40 mEq total) by mouth 2 (two) times daily.   RELION PEN NEEDLE 31G/8MM 31G X 8 MM Misc Generic drug: Insulin Pen Needle 3 (three) times daily.   rosuvastatin 40 MG tablet Commonly known as: CRESTOR TAKE 1 TABLET BY MOUTH DAILY   sertraline 100 MG tablet Commonly known as: ZOLOFT Take 150 mg by mouth in the morning.   spironolactone 25 MG tablet Commonly known as: ALDACTONE TAKE 1 TABLET BY MOUTH DAILY   torsemide 20 MG tablet Commonly known as: DEMADEX Take 4 tablets (80 mg total) by mouth 2 (two) times daily. May take an additional 20 mg tablet daily as needed for 2-3 lbs weight gain in one day or 5 lbs in 1 week   Trelegy Ellipta 100-62.5-25 MCG/ACT Aepb Generic drug: Fluticasone-Umeclidin-Vilant Inhale 1 puff into the lungs daily. What changed: Another medication with the same name was removed. Continue taking this  medication, and follow the directions you see here. Changed by: Chesley Mires, MD   Vitamin D3 50 MCG (2000 UT) Tabs Take 1 capsule by mouth in the morning and at bedtime.   zinc sulfate 220 (50 Zn) MG capsule Take 220 mg by mouth every evening.        Signature:  Chesley Mires, MD [facility] P[facility] - [phone] 03/12/2022, 9:30 AM

## 2022-03-18 ENCOUNTER — Encounter (HOSPITAL_COMMUNITY): Payer: Self-pay

## 2022-03-18 ENCOUNTER — Ambulatory Visit (HOSPITAL_BASED_OUTPATIENT_CLINIC_OR_DEPARTMENT_OTHER)
Admission: RE | Admit: 2022-03-18 | Discharge: 2022-03-18 | Disposition: A | Discharge status: Home / Self Care | Payer: Medicare Other | Source: Ambulatory Visit | Attending: Family Medicine | Admitting: Family Medicine

## 2022-03-18 ENCOUNTER — Ambulatory Visit (HOSPITAL_COMMUNITY)
Admission: RE | Admit: 2022-03-18 | Discharge: 2022-03-18 | Disposition: A | Discharge status: Home / Self Care | Payer: Medicare Other | Source: Ambulatory Visit | Attending: Family Medicine | Admitting: Family Medicine

## 2022-03-18 VITALS — BP 148/82 | HR 70 | Ht 72.0 in | Wt 279.0 lb

## 2022-03-18 DIAGNOSIS — Z8673 Personal history of transient ischemic attack (TIA), and cerebral infarction without residual deficits: Secondary | ICD-10-CM | POA: Insufficient documentation

## 2022-03-18 DIAGNOSIS — Z7901 Long term (current) use of anticoagulants: Secondary | ICD-10-CM | POA: Insufficient documentation

## 2022-03-18 DIAGNOSIS — I5022 Chronic systolic (congestive) heart failure: Secondary | ICD-10-CM | POA: Diagnosis present

## 2022-03-18 DIAGNOSIS — Z9989 Dependence on other enabling machines and devices: Secondary | ICD-10-CM

## 2022-03-18 DIAGNOSIS — Z79899 Other long term (current) drug therapy: Secondary | ICD-10-CM | POA: Diagnosis not present

## 2022-03-18 DIAGNOSIS — E1122 Type 2 diabetes mellitus with diabetic chronic kidney disease: Secondary | ICD-10-CM | POA: Diagnosis not present

## 2022-03-18 DIAGNOSIS — N183 Chronic kidney disease, stage 3 unspecified: Secondary | ICD-10-CM | POA: Insufficient documentation

## 2022-03-18 DIAGNOSIS — I071 Rheumatic tricuspid insufficiency: Secondary | ICD-10-CM | POA: Diagnosis not present

## 2022-03-18 DIAGNOSIS — Z951 Presence of aortocoronary bypass graft: Secondary | ICD-10-CM | POA: Insufficient documentation

## 2022-03-18 DIAGNOSIS — E1140 Type 2 diabetes mellitus with diabetic neuropathy, unspecified: Secondary | ICD-10-CM | POA: Diagnosis not present

## 2022-03-18 DIAGNOSIS — I25119 Atherosclerotic heart disease of native coronary artery with unspecified angina pectoris: Secondary | ICD-10-CM

## 2022-03-18 DIAGNOSIS — I447 Left bundle-branch block, unspecified: Secondary | ICD-10-CM | POA: Insufficient documentation

## 2022-03-18 DIAGNOSIS — I255 Ischemic cardiomyopathy: Secondary | ICD-10-CM | POA: Insufficient documentation

## 2022-03-18 DIAGNOSIS — Z794 Long term (current) use of insulin: Secondary | ICD-10-CM | POA: Diagnosis not present

## 2022-03-18 DIAGNOSIS — Z6837 Body mass index (BMI) 37.0-37.9, adult: Secondary | ICD-10-CM | POA: Insufficient documentation

## 2022-03-18 DIAGNOSIS — J449 Chronic obstructive pulmonary disease, unspecified: Secondary | ICD-10-CM | POA: Insufficient documentation

## 2022-03-18 DIAGNOSIS — I2510 Atherosclerotic heart disease of native coronary artery without angina pectoris: Secondary | ICD-10-CM | POA: Diagnosis not present

## 2022-03-18 DIAGNOSIS — Z8616 Personal history of COVID-19: Secondary | ICD-10-CM | POA: Diagnosis not present

## 2022-03-18 DIAGNOSIS — I130 Hypertensive heart and chronic kidney disease with heart failure and stage 1 through stage 4 chronic kidney disease, or unspecified chronic kidney disease: Secondary | ICD-10-CM | POA: Diagnosis not present

## 2022-03-18 DIAGNOSIS — I4821 Permanent atrial fibrillation: Secondary | ICD-10-CM | POA: Insufficient documentation

## 2022-03-18 DIAGNOSIS — R0602 Shortness of breath: Secondary | ICD-10-CM | POA: Diagnosis not present

## 2022-03-18 DIAGNOSIS — I272 Pulmonary hypertension, unspecified: Secondary | ICD-10-CM | POA: Insufficient documentation

## 2022-03-18 DIAGNOSIS — G629 Polyneuropathy, unspecified: Secondary | ICD-10-CM

## 2022-03-18 DIAGNOSIS — Z7984 Long term (current) use of oral hypoglycemic drugs: Secondary | ICD-10-CM | POA: Diagnosis not present

## 2022-03-18 DIAGNOSIS — G4733 Obstructive sleep apnea (adult) (pediatric): Secondary | ICD-10-CM | POA: Insufficient documentation

## 2022-03-18 LAB — BASIC METABOLIC PANEL
Anion gap: 9 (ref 5–15)
BUN: 17 mg/dL (ref 8–23)
CO2: 35 mmol/L — ABNORMAL HIGH (ref 22–32)
Calcium: 8.8 mg/dL — ABNORMAL LOW (ref 8.9–10.3)
Chloride: 99 mmol/L (ref 98–111)
Creatinine, Ser: 1 mg/dL (ref 0.61–1.24)
GFR, Estimated: >60 mL/min (ref >60)
Glucose, Bld: 106 mg/dL — ABNORMAL HIGH (ref 70–99)
Potassium: 2.8 mmol/L — ABNORMAL LOW (ref 3.5–5.1)
Sodium: 143 mmol/L (ref 135–145)

## 2022-03-18 LAB — ECHOCARDIOGRAM COMPLETE
Area-P 1/2: 4.77 cm2
S' Lateral: 4.1 cm

## 2022-03-18 NOTE — Progress Notes (Signed)
  Echocardiogram 2D Echocardiogram has been performed.  Tommy Riley 03/18/2022, 10:46 AM

## 2022-03-18 NOTE — Progress Notes (Signed)
ADVANCED HF CLINIC NOTE   Primary Care: Pomposini, [name], MD HF Cardiologist: Dr. Haroldine Laws  HPI: Tommy Riley is a 76 y.o. male with history of chronic systolic HF, ischemic cardiomyopathy, CAD s/p CABG in 2001, permanent atrial fibrillation s/p atrial fibrillation and atrial flutter ablation in 2001, OSA on CPAP, DM II.    Previously seen by Dr. Gilles Chiquito at [facility] for pulmonary hypertension in 2018. Felt to be multifactorial and likely combination of WHO groups 2 and 3.    Admitted to [facility] 10/09-10/15/22 with a/c HF. Echo with LVEF 35-40%, RV mildly reduced, RVSP 66 mmHg, trivial MR.   Directly admitted from Cardiology clinic on 04/30/21 with a/c CHF. Diuresed with IV lasix, diamox, and metolazone.  [place] Kitchen Has LBBB with wide QRS. Seen by EP and unclear how much he would benefit from CRT. Underwent L/RHC  with patent grafts (Y graft from LIMA to distal LAD and distal circumflex marginal branch, vein graft to PDA) and showed pulmonary hypertension, CO preserved. Pulmonary HTN felt be d/t combination of WHO Group II and III. cMRI 11/02 with evidence of mixed ischemic and nonischemic cardiomyopathy. LVEF 34%. Underwent CRT-D on 11/02, did not get AV nodal ablation. IV lasix switched to Torsemide. Counseled extensively on cutting back fluid intake. Continued on GDMT with digoxin, spironolactone, entresto, farxiga and metoprolol succinate.  Discharge weight 280 lbs.  Admitted in 2/23 with recurrent HF in setting of AF. Underwent diuresis and AV node ablation by Dr. Lovena Le.  Echo 10/22 EF 35-40% R V mildly reduced   Saw Dr. Lovena Le 4/23 and device reprogrammed rate from 80 down to 70.  Today he returns for HF follow up with his wife. Overall feeling fine. Main complaint is frustration with having to use oxygen regularly. O2 sats drop to 83% with activity when off oxygen. Remains chronically SOB with activity, able to do ADLs around the house if he takes his time. Continues with bilateral lower  leg neuropathy and had a falls 3 weeks ago. + hematoma to right knee that required evacuation in the ED. He did not hit his head. Denies palpitations, CP, dizziness, or PND/Orthopnea. Appetite ok. No fever or chills. Weight at home 280 lbs. Taking all medications. Wears CPAP nightly.  Echo today 03/18/22, results pending.  Past Medical History:  Diagnosis Date   Anxiety    Atrial fibrillation and flutter ([place])    Atrial fibrillation ablation [facility]   CAD in native artery    Chronic systolic heart failure (HCC)    CKD (chronic kidney disease), stage III (HCC)    COPD (chronic obstructive pulmonary disease) ([place])    COVID-22 February 2020   Depression    Essential hypertension    Gout    Hyperlipidemia    Ischemic heart disease    OSA (obstructive sleep apnea)    Stroke ([place]) 2003   Type 2 diabetes mellitus (HCC)    Current Outpatient Medications  Medication Sig Dispense Refill   ACCU-CHEK AVIVA PLUS test strip      albuterol (PROVENTIL) (2.5 MG/3ML) 0.083% nebulizer solution Take 3 mLs (2.5 mg total) by nebulization every 6 (six) hours as needed for wheezing or shortness of breath. 75 mL 4   albuterol (VENTOLIN HFA) 108 (90 Base) MCG/ACT inhaler USE 2 INHALATIONS BY MOUTH  EVERY 4 HOURS AS NEEDED FOR SHORTNESS OF BREATH OR  WHEEZE 51 g 3   allopurinol (ZYLOPRIM) 100 MG tablet Taking two tablets by mouth in the morning  and 1 tablet by mouth in the evening     apixaban (ELIQUIS) 5 MG TABS tablet Take 1 tablet by mouth twice daily 60 tablet 5   aspirin 81 MG EC tablet Take 81 mg by mouth in the morning.     Blood Glucose Monitoring Suppl (ONE TOUCH ULTRA 2) W/DEVICE KIT by Does not apply route.     Cholecalciferol (VITAMIN D3) 2000 UNITS TABS Take 1 capsule by mouth in the morning and at bedtime.     diphenhydramine-acetaminophen (TYLENOL PM) 25-500 MG TABS tablet Take 2 tablets by mouth at bedtime.     ezetimibe (ZETIA) 10 MG tablet Take 10 mg by mouth in the morning.      FARXIGA 10 MG TABS tablet Take 1 tablet (10 mg total) by mouth daily. 30 tablet 90   fluorouracil (EFUDEX) 5 % cream Apply 1 application topically in the morning, at noon, in the evening, and at bedtime.     Fluticasone-Umeclidin-Vilant (TRELEGY ELLIPTA) 100-62.5-25 MCG/ACT AEPB Inhale 1 puff into the lungs daily. 60 each 6   folic acid (FOLVITE) 347 MCG tablet Take 400 mcg by mouth every evening.     HUMALOG KWIKPEN 200 UNIT/ML KwikPen 20 Units as needed. On a sliding scale     insulin detemir (LEVEMIR) 100 UNIT/ML injection Inject 50 Units into the skin 2 (two) times daily. On a sliding scale     loratadine (CLARITIN) 10 MG tablet Take 10 mg by mouth in the morning.     melatonin 5 MG TABS Take 5 mg by mouth at bedtime.     metoprolol (TOPROL-XL) 200 MG 24 hr tablet Take 200 mg by mouth daily.     Multiple Vitamins-Minerals (CENTRUM CARDIO) TABS Take 1 tablet by mouth in the morning.     omega-3 acid ethyl esters (LOVAZA) 1 g capsule Take 2 capsules (2 g total) by mouth 2 (two) times daily.     oxymetazoline (AFRIN) 0.05 % nasal spray Place 1 spray into both nostrils 2 (two) times daily.     potassium chloride SA (KLOR-CON M) 20 MEQ tablet Take 2 tablets (40 mEq total) by mouth 2 (two) times daily. 360 tablet 3   RELION PEN NEEDLE 31G/8MM 31G X 8 MM MISC 3 (three) times daily.     rosuvastatin (CRESTOR) 40 MG tablet TAKE 1 TABLET BY MOUTH DAILY 90 tablet 1   sacubitril-valsartan (ENTRESTO) 24-26 MG Take 1 tablet by mouth 2 (two) times daily. 180 tablet 3   sertraline (ZOLOFT) 100 MG tablet Take 150 mg by mouth in the morning.     spironolactone (ALDACTONE) 25 MG tablet TAKE 1 TABLET BY MOUTH DAILY 90 tablet 1   torsemide (DEMADEX) 20 MG tablet Take 4 tablets (80 mg total) by mouth 2 (two) times daily. May take an additional 20 mg tablet daily as needed for 2-3 lbs weight gain in one day or 5 lbs in 1 week 990 tablet 2   vitamin C (ASCORBIC ACID) 500 MG tablet Take 500 mg by mouth in the morning  and at bedtime.     zinc sulfate 220 (50 Zn) MG capsule Take 220 mg by mouth every evening.     No current facility-administered medications for this encounter.   Allergies  Allergen Reactions   Codeine Anaphylaxis, Shortness Of Breath and Other (See Comments)    Tightness in chest    Social History   Socioeconomic History   Marital status: Married    Spouse name: Jeron Grahn  Number of children: 3   Years of education: 14   Highest education level: Some college, no degree  Occupational History    Comment: retired  Tobacco Use   Smoking status: Former    Packs/day: 2.50    Years: 50.00    Total pack years: 125.00    Types: Cigarettes    Quit date: 06/17/2011    Years since quitting: 10.7   Smokeless tobacco: Never  Vaping Use   Vaping Use: Some days   Start date: 07/06/2017   Last attempt to quit: 04/19/2021   Substances: Flavoring  Substance and Sexual Activity   Alcohol use: No    Comment: 1 beer a month   Drug use: No   Sexual activity: Not on file  Other Topics Concern   Not on file  Social History Narrative   Consumes no caffeine   Social Determinants of Health   Financial Resource Strain: Low Risk  (05/01/2021)   Overall Financial Resource Strain (CARDIA)    Difficulty of Paying Living Expenses: Not hard at all  Food Insecurity: No Food Insecurity (05/01/2021)   Hunger Vital Sign    Worried About Running Out of Food in the Last Year: Never true    Ran Out of Food in the Last Year: Never true  Transportation Needs: No Transportation Needs (05/01/2021)   PRAPARE - Hydrologist (Medical): No    Lack of Transportation (Non-Medical): No  Physical Activity: Not on file  Stress: Not on file  Social Connections: Not on file  Intimate Partner Violence: Not on file   Family History  Problem Relation Age of Onset   Raynaud syndrome Mother    Stroke Father    Diabetes Mellitus II Father    Hypertension Father    Hypertension  Sister    Diabetes Mellitus II Sister    Stroke Brother    Crohn's disease Brother    Breast cancer Brother    COPD Brother    BP (!) 148/82   Pulse 70   Ht 6' (1.829 m)   Wt 126.6 kg (279 lb)   SpO2 97% Comment: on 3L  BMI 37.84 kg/m   Wt Readings from Last 3 Encounters:  03/18/22 126.6 kg (279 lb)  03/12/22 127.2 kg (280 lb 6.4 oz)  11/20/21 123.7 kg (272 lb 9.6 oz)   PHYSICAL EXAM: General:  NAD. No resp difficulty, walked into clinic with cane on portable oxygen HEENT: Normal Neck: Supple. Thick neck. Carotids 2+ bilat; no bruits. No lymphadenopathy or thryomegaly appreciated. Cor: PMI nondisplaced. Irregular rate & rhythm. No rubs, gallops or murmurs. Lungs: Diminished throughout Abdomen: Obese, soft, nontender, nondistended. No hepatosplenomegaly. No bruits or masses. Good bowel sounds. Extremities: No cyanosis, clubbing, rash, edema; ted house on, Right knee edema w/ bandage Neuro: Alert & oriented x 3, cranial nerves grossly intact. Moves all 4 extremities w/o difficulty. Affect pleasant.  Device interrogation: HL score 0, thoracic impedence ok, average HR 71 bpm, activity level 0.4 hr/day (Personally reviewed)  ASSESSMENT & PLAN:  1.Chronic systolic HF: - EF 77-93% on echo, RV mildly reduced - Etiology of LV dysfunction unclear but cMRI suggest mixed ischemic/nonischemic CM - s/p [employer] CRT-D. Device interrogated personally in clinic as above.  - Stable NYHA II-III, volume looks OK. - Continue torsemide 80 mg bid + 40 KCL bid. - Continue Entresto 24-26 mg bid. Has not had morning meds, will not increase today. - Continue spironolactone 25 mg daily.  - Continue  Toprol 200 mg daily. - Continue Farxiga 10 mg daily.  - Reinforced need to limit fluids. - May benefit from [place]. - Echo today, results pending. - Labs today.  2. CAD s/p CABG - Grafts patent on cath 05/05/21. - No s/s angina. - Continue ASA + beta blocker + statin.   3. Pulmonary HTN -  RHC 05/05/21:  RA 14 RV 68/7 PA 68/unrecorded (40) PCWP unobtained LVEDP 21 Fick  4.9/2.0  PVR (using LVEDP) = 3.9 WU - Mixed PH WHO Group II & III. - Continue torsemide and CPAP.  - Needs weight loss.   4. Chronic AF with LBBB and intermittent rate control  - Has been in AF > 10 years despite previous AF/AFL ablations - Now s/p AVN ablation and CRT-D implant 11/22. - Now off digoxin. - CHA2DS2-VASc score is 7. - Continue Eliquis 5 mg bid. No bleeding issues.   5. OSA - On CPAP. - Continue nightly use.   6. Morbid obesity - Body mass index is 37.84 kg/m. - Needs weight loss.   7. Neuropathy - Significantly impairing functional status, right foot is essentially numb. Had a recent fall. - Discussed PT for balance training. - He would like to follow up with his Neurologist, Dr. Rexene Alberts.  Follow up in 4-6 months with Dr. Haroldine Laws.  Rafael Bihari, FNP  11:39 AM

## 2022-03-18 NOTE — Patient Instructions (Addendum)
Labs done today. We will contact you only if your labs are abnormal.  No medication changes were made. Please continue all current medications as prescribed.  Please schedule an appointment with Dr. Rexene Alberts as soon as possible. Her office number is [phone].  Your physician recommends that you schedule a follow-up appointment in: 6 months with Dr. Haroldine Laws. Please contact our office in February 2024 to schedule a March 2024 appointment.   If you have any questions or concerns before your next appointment please send [place] a message through [place] or call our office at [phone].    TO LEAVE A MESSAGE FOR THE NURSE SELECT OPTION 2, PLEASE LEAVE A MESSAGE INCLUDING: YOUR NAME DATE OF BIRTH CALL BACK NUMBER REASON FOR CALL**this is important as we prioritize the call backs  YOU WILL RECEIVE A CALL BACK THE SAME DAY AS LONG AS YOU CALL BEFORE 4:00 PM   Do the following things EVERYDAY: Weigh yourself in the morning before breakfast. Write it down and keep it in a log. Take your medicines as prescribed Eat low salt foods--Limit salt (sodium) to 2000 mg per day.  Stay as active as you can everyday Limit all fluids for the day to less than 2 liters   At the [facility], you and your health needs are our priority. As part of our continuing mission to provide you with exceptional heart care, we have created designated Provider Care Teams. These Care Teams include your primary Cardiologist (physician) and Advanced Practice Providers (APPs- Physician Assistants and Nurse Practitioners) who all work together to provide you with the care you need, when you need it.   You may see any of the following providers on your designated Care Team at your next follow up: Dr Glori Bickers Dr Haynes Kerns, NP Lyda Jester, [place] Audry Riles, PharmD   Please be sure to bring in all your medications bottles to every appointment.

## 2022-03-20 ENCOUNTER — Encounter (HOSPITAL_COMMUNITY): Payer: Self-pay | Admitting: Cardiology

## 2022-03-20 ENCOUNTER — Telehealth: Payer: Self-pay | Admitting: Pulmonary Disease

## 2022-03-20 DIAGNOSIS — G4733 Obstructive sleep apnea (adult) (pediatric): Secondary | ICD-10-CM

## 2022-03-20 MED ORDER — POTASSIUM CHLORIDE CRYS ER 20 MEQ PO TBCR: 40.0000 meq | EXTENDED_RELEASE_TABLET | Freq: Three times a day (TID) | ORAL | 3 refills | Status: DC

## 2022-03-20 NOTE — Telephone Encounter (Signed)
Patient called.  Patient aware.  

## 2022-03-20 NOTE — Telephone Encounter (Signed)
-----   Message from Rafael Bihari, [place] sent at 03/18/2022  1:34 PM EDT ----- K is very low. Has he been taking 40 bid of KCL consistently? If so, need to take extra 60 KCL x 1 today and then increase daily KCL regimen to 40 tid. Repeat BMET on Monday

## 2022-03-23 NOTE — Telephone Encounter (Signed)
Placed new order for [facility] in [place]. Nothing further needed.

## 2022-03-26 ENCOUNTER — Telehealth: Payer: Self-pay | Admitting: Pulmonary Disease

## 2022-03-26 NOTE — Telephone Encounter (Signed)
Called and spoke to patient and let him know that I did send the fax with his order to [facility] and received confirmation before I called him. Nothing further needed. Patient will call if order was not received.

## 2022-03-27 ENCOUNTER — Telehealth: Payer: Self-pay | Admitting: Pulmonary Disease

## 2022-03-27 DIAGNOSIS — G4733 Obstructive sleep apnea (adult) (pediatric): Secondary | ICD-10-CM

## 2022-03-27 NOTE — Telephone Encounter (Signed)
New cpap supplies order signed by Dr.Alva. ATC to notify patient or wife. Left vm on answering machine letting them know sleep study and new order was faxed to [place] [facility]

## 2022-03-27 NOTE — Telephone Encounter (Signed)
New order placed for cpap supplies since prev. Order only mentioned pressure change. Will wait for Dr. Halford Chessman to sign and then fax over to [facility] in [place]

## 2022-04-03 ENCOUNTER — Other Ambulatory Visit (HOSPITAL_COMMUNITY): Payer: Self-pay | Admitting: Internal Medicine

## 2022-04-13 ENCOUNTER — Telehealth: Payer: Self-pay | Admitting: Cardiology

## 2022-04-13 NOTE — Telephone Encounter (Signed)
lmtcb

## 2022-04-13 NOTE — Telephone Encounter (Signed)
Pt returning call

## 2022-04-13 NOTE — Telephone Encounter (Signed)
Pt c/o medication issue:  1. Name of Medication:   apixaban (ELIQUIS) 5 MG TABS tablet    2. How are you currently taking this medication (dosage and times per day)? Take 1 tablet by mouth twice daily  3. Are you having a reaction (difficulty breathing--STAT)? No  4. What is your medication issue? Spouse calling to in regards to paperwork for medication being sent to [facility]. She states that on paperwork dosage for medication is missing. She would like a callback regarding this matter as soon as possible. Please advise

## 2022-04-13 NOTE — Telephone Encounter (Signed)
Patient states that a representative with [employer] informed him that the patient assistance application for eliquis was received but is missing the actual prescription. Requesting that prescription for eliquis be faxed to [facility] at [phone].

## 2022-04-14 ENCOUNTER — Other Ambulatory Visit: Payer: Self-pay | Admitting: *Deleted

## 2022-04-14 MED ORDER — APIXABAN 5 MG PO TABS: 5.0000 mg | ORAL_TABLET | Freq: Two times a day (BID) | ORAL | 3 refills | Status: DC

## 2022-04-14 NOTE — Telephone Encounter (Signed)
Eliquis 5 mg prescription and licensed prescriber-MD section of application (page 4) faxed to [facility] [phone]

## 2022-04-19 ENCOUNTER — Other Ambulatory Visit: Payer: Self-pay | Admitting: Cardiology

## 2022-04-22 ENCOUNTER — Telehealth: Payer: Self-pay | Admitting: Pulmonary Disease

## 2022-04-22 NOTE — Telephone Encounter (Signed)
Called and spoke to [place] with [facility]. Advised her to call Dr. Alessandra Grout office to get a copy of sleep study [phone] since he was the interpreting physician and there is not a copy of sleep study in patients chart. She voiced understanding and nothing further is needed at this time.

## 2022-05-07 ENCOUNTER — Ambulatory Visit (INDEPENDENT_AMBULATORY_CARE_PROVIDER_SITE_OTHER): Payer: Medicare Other

## 2022-05-07 DIAGNOSIS — I255 Ischemic cardiomyopathy: Secondary | ICD-10-CM | POA: Diagnosis not present

## 2022-05-07 LAB — CUP PACEART REMOTE DEVICE CHECK
Battery Remaining Longevity: 132 mo
Battery Remaining Percentage: 100 %
Brady Statistic RA Percent Paced: 98 %
Brady Statistic RV Percent Paced: 0 %
Date Time Interrogation Session: 20231102012900
HighPow Impedance: 69 Ohm
Implantable Lead Connection Status: 753985
Implantable Lead Connection Status: 753985
Implantable Lead Connection Status: 753985
Implantable Lead Implant Date: 20221103
Implantable Lead Implant Date: 20221103
Implantable Lead Implant Date: 20221103
Implantable Lead Location: 753858
Implantable Lead Location: 753860
Implantable Lead Location: 753860
Implantable Lead Model: 138
Implantable Lead Model: 3830
Implantable Lead Model: 4671
Implantable Lead Serial Number: 304581
Implantable Lead Serial Number: 852093
Implantable Pulse Generator Implant Date: 20221103
Lead Channel Impedance Value: 545 Ohm
Lead Channel Impedance Value: 547 Ohm
Lead Channel Impedance Value: 561 Ohm
Lead Channel Setting Pacing Amplitude: 2.5 V
Lead Channel Setting Sensing Sensitivity: 0.5 mV
Lead Channel Setting Sensing Sensitivity: 1 mV
Pulse Gen Serial Number: 390312

## 2022-05-16 ENCOUNTER — Other Ambulatory Visit: Payer: Self-pay | Admitting: Cardiology

## 2022-05-18 ENCOUNTER — Telehealth: Payer: Self-pay | Admitting: Pulmonary Disease

## 2022-05-18 NOTE — Telephone Encounter (Signed)
Orlando Penner with [facility] in [place] called stating she needs OV notes from 03/12/22 from Dr. Halford Chessman and 08/26/21 with Geraldo Pitter faxed as well.  Fax:  [phone].  States order received was verbal cosign versus actual signature that insurance needs.  [phone].  Please advise.

## 2022-05-18 NOTE — Progress Notes (Signed)
Remote ICD transmission.   

## 2022-05-19 NOTE — Telephone Encounter (Signed)
Spoke to [place] in local office - she is going to call [facility] and see what they need and send to them.  Nothing further needed.

## 2022-06-01 ENCOUNTER — Encounter: Payer: Self-pay | Admitting: Cardiology

## 2022-06-01 ENCOUNTER — Ambulatory Visit: Payer: Medicare Other | Attending: Cardiology | Admitting: Cardiology

## 2022-06-01 VITALS — BP 116/78 | HR 69 | Ht 71.0 in | Wt 283.0 lb

## 2022-06-01 DIAGNOSIS — Z79899 Other long term (current) drug therapy: Secondary | ICD-10-CM

## 2022-06-01 DIAGNOSIS — I5022 Chronic systolic (congestive) heart failure: Secondary | ICD-10-CM | POA: Diagnosis not present

## 2022-06-01 DIAGNOSIS — I4819 Other persistent atrial fibrillation: Secondary | ICD-10-CM

## 2022-06-01 DIAGNOSIS — I272 Pulmonary hypertension, unspecified: Secondary | ICD-10-CM

## 2022-06-01 DIAGNOSIS — I25119 Atherosclerotic heart disease of native coronary artery with unspecified angina pectoris: Secondary | ICD-10-CM | POA: Diagnosis not present

## 2022-06-01 MED ORDER — ENTRESTO 24-26 MG PO TABS: 1.0000 | ORAL_TABLET | Freq: Two times a day (BID) | ORAL | 3 refills | Status: DC

## 2022-06-01 NOTE — Progress Notes (Signed)
Cardiology Office Note  Date: 06/01/2022   ID: Tommy Riley, DOB 1946-03-20, MRN [id number]  PCP:  Clinton Quant, MD  Cardiologist:  Rozann Lesches, MD Electrophysiologist:  Vickie Epley, MD   Chief Complaint  Patient presents with   Cardiac follow-up    History of Present Illness: Tommy Riley is a 76 y.o. male last seen in May.  He has had interval evaluation in the heart failure clinic, I reviewed the note from September.  He is here for a routine visit.  Reports NYHA class II dyspnea with most activities, still using supplemental oxygen.  His weight has been stable.  [employer] CRT-D in place with follow-up by Dr. Lovena Le.  Device check in November revealed normal function with AAIR pacing.  Follow-up echocardiogram in September revealed improvement in LVEF to the range of 40 to 45%.  We went over his current medications which are outlined below.  Follow-up lab work in September showed BUN 22, creatinine 1.05, and potassium up to 4.1 with supplementation.  Past Medical History:  Diagnosis Date   Anxiety    Atrial fibrillation and flutter ([place])    Atrial fibrillation ablation [facility] at [facility]   CAD in native artery    Chronic systolic heart failure (HCC)    CKD (chronic kidney disease), stage III (HCC)    COPD (chronic obstructive pulmonary disease) ([place])    COVID-22 February 2020   Depression    Essential hypertension    Gout    Hyperlipidemia    Ischemic heart disease    OSA (obstructive sleep apnea)    Stroke ([place]) 2003   Type 2 diabetes mellitus ([place])     Past Surgical History:  Procedure Laterality Date   ATRIAL FIBRILLATION ABLATION  2011   [facility]   AV NODE ABLATION N/A 05/07/2021   Procedure: AV NODE ABLATION;  Surgeon: Evans Lance, MD;  Location: [facility];  Service: Cardiovascular;  Laterality: N/A;   AV NODE ABLATION N/A 09/01/2021   Procedure: AV NODE ABLATION;  Surgeon: Evans Lance, MD;  Location: [facility]  [facility];  Service: Cardiovascular;  Laterality: N/A;   CATARACT EXTRACTION Bilateral    CORONARY ARTERY BYPASS GRAFT  2001   HERNIA REPAIR Right    ICD IMPLANT N/A 05/07/2021   Procedure: ICD IMPLANT;  Surgeon: Evans Lance, MD;  Location: [place] [facility];  Service: Cardiovascular;  Laterality: N/A;   RIGHT/LEFT HEART CATH AND CORONARY/GRAFT ANGIOGRAPHY N/A 05/05/2021   Procedure: RIGHT/LEFT HEART CATH AND CORONARY/GRAFT ANGIOGRAPHY;  Surgeon: Lorretta Harp, MD;  Location: [facility];  Service: Cardiovascular;  Laterality: N/A;   TONSILLECTOMY AND ADENOIDECTOMY      Current Outpatient Medications  Medication Sig Dispense Refill   ACCU-CHEK AVIVA PLUS test strip      albuterol (PROVENTIL) (2.5 MG/3ML) 0.083% nebulizer solution Take 3 mLs (2.5 mg total) by nebulization every 6 (six) hours as needed for wheezing or shortness of breath. 75 mL 4   albuterol (VENTOLIN HFA) 108 (90 Base) MCG/ACT inhaler USE 2 INHALATIONS BY MOUTH  EVERY 4 HOURS AS NEEDED FOR SHORTNESS OF BREATH OR  WHEEZE 51 g 3   allopurinol (ZYLOPRIM) 100 MG tablet Taking two tablets by mouth in the morning and 1 tablet by mouth in the evening     apixaban (ELIQUIS) 5 MG TABS tablet Take 1 tablet (5 mg total) by mouth 2 (two) times daily. 180 tablet 3  aspirin 81 MG EC tablet Take 81 mg by mouth in the morning.     Blood Glucose Monitoring Suppl (ONE TOUCH ULTRA 2) W/DEVICE KIT by Does not apply route.     Cholecalciferol (VITAMIN D3) 2000 UNITS TABS Take 1 capsule by mouth in the morning and at bedtime.     diphenhydramine-acetaminophen (TYLENOL PM) 25-500 MG TABS tablet Take 2 tablets by mouth at bedtime.     ezetimibe (ZETIA) 10 MG tablet Take 10 mg by mouth in the morning.     FARXIGA 10 MG TABS tablet Take 1 tablet (10 mg total) by mouth daily. 30 tablet 90   fluorouracil (EFUDEX) 5 % cream Apply 1 application topically in the morning, at noon, in the evening, and at bedtime.     Fluticasone-Umeclidin-Vilant  (TRELEGY ELLIPTA) 100-62.5-25 MCG/ACT AEPB Inhale 1 puff into the lungs daily. 60 each 6   folic acid (FOLVITE) 389 MCG tablet Take 400 mcg by mouth every evening.     HUMALOG KWIKPEN 200 UNIT/ML KwikPen 20 Units as needed. On a sliding scale     insulin detemir (LEVEMIR) 100 UNIT/ML injection Inject 50 Units into the skin 2 (two) times daily. On a sliding scale     loratadine (CLARITIN) 10 MG tablet Take 10 mg by mouth in the morning.     melatonin 5 MG TABS Take 5 mg by mouth at bedtime.     metoprolol (TOPROL-XL) 200 MG 24 hr tablet Take 200 mg by mouth daily.     Multiple Vitamins-Minerals (CENTRUM CARDIO) TABS Take 1 tablet by mouth in the morning.     omega-3 acid ethyl esters (LOVAZA) 1 g capsule Take 2 capsules (2 g total) by mouth 2 (two) times daily.     oxymetazoline (AFRIN) 0.05 % nasal spray Place 1 spray into both nostrils 2 (two) times daily.     potassium chloride SA (KLOR-CON M) 20 MEQ tablet Take 2 tablets (40 mEq total) by mouth 3 (three) times daily. 540 tablet 3   RELION PEN NEEDLE 31G/8MM 31G X 8 MM MISC 3 (three) times daily.     rosuvastatin (CRESTOR) 40 MG tablet TAKE 1 TABLET BY MOUTH DAILY 100 tablet 1   sertraline (ZOLOFT) 100 MG tablet Take 150 mg by mouth in the morning.     spironolactone (ALDACTONE) 25 MG tablet TAKE 1 TABLET BY MOUTH DAILY 100 tablet 1   torsemide (DEMADEX) 20 MG tablet TAKE 4 TABLETS BY MOUTH 2 TIMES  DAILY. MAY TAKE AN ADDITIONAL 1  TABLET DAILY AS NEEDED FOR 2-3  LBS WEIGHT GAIN IN ONE DAY OR 5  LBS IN 1 WEEK 900 tablet 1   vitamin C (ASCORBIC ACID) 500 MG tablet Take 500 mg by mouth in the morning and at bedtime.     zinc sulfate 220 (50 Zn) MG capsule Take 220 mg by mouth every evening.     sacubitril-valsartan (ENTRESTO) 24-26 MG Take 1 tablet by mouth 2 (two) times daily. 180 tablet 3   No current facility-administered medications for this visit.   Allergies:  Codeine   ROS: No syncope.  Physical Exam: VS:  BP 116/78   Pulse 69    Ht _0  (1.803 m)   Wt 283 lb (128.4 kg)   SpO2 96%   BMI 39.47 kg/m , BMI Body mass index is 39.47 kg/m.  Wt Readings from Last 3 Encounters:  06/01/22 283 lb (128.4 kg)  03/18/22 279 lb (126.6 kg)  03/12/22 280 lb 6.4  oz (127.2 kg)    General: Patient appears comfortable at rest.  Wearing oxygen via nasal cannula. HEENT: Conjunctiva and lids normal. Neck: Supple, no elevated JVP or carotid bruits, no thyromegaly. Lungs: Clear to auscultation, nonlabored breathing at rest. Cardiac: Regular rate and rhythm, no S3 or significant systolic murmur. Extremities: Stable leg edema.  ECG:  An ECG dated 10/24/2021 was personally reviewed today and demonstrated:  Ventricular pacing with underlying atrial fibrillation.  Recent Labwork: 08/26/2021: Pro B Natriuretic peptide (BNP) 353.0 08/29/2021: Hemoglobin 14.1; Platelets 120 09/01/2021: Magnesium 2.2 09/15/2021: B Natriuretic Peptide 258.7 03/18/2022: BUN 17; Creatinine, Ser 1.00; Potassium 2.8; Sodium 143   Other Studies Reviewed Today:  Echocardiogram 03/18/2022: 1. Left ventricular ejection fraction, by estimation, is 40 to 45%. The  left ventricle has mildly decreased function. The left ventricle  demonstrates global hypokinesis. There is mild left ventricular  hypertrophy. Left ventricular diastolic parameters  are indeterminate.   2. Right ventricular systolic function is moderately reduced. The right  ventricular size is normal.   3. Left atrial size was severely dilated.   4. Right atrial size was moderately dilated.   5. The mitral valve is normal in structure. Mild mitral valve  regurgitation.   6. The aortic valve was not well visualized. Aortic valve regurgitation  is not visualized.   Assessment and Plan:  1.  HFmrEF with history of mixed ischemic/nonischemic cardiomyopathy.  LVEF up to the range of 40 to 45% as of September.  He has a Chemical engineer CRT-D in place.  Currently NYHA class II-III symptoms and stable  weight.  Plan to check BMET.  Continue Farxiga, Toprol-XL, Entresto, Aldactone, Demadex with potassium supplement.  Keep follow-up in the heart failure clinic in 3 months.  2.  Pulmonary hypertension, WHO group 2 and 3.  3.  Chronic atrial fibrillation with history of AV node ablation and CRT-D in place.  He is on Eliquis for stroke prophylaxis with CHA2DS2-VASc score of 7.  No reported spontaneous bleeding problems.  4.  OSA on CPAP.  5.  Multivessel CAD status post CABG.  No active angina symptoms.  Bypass grafts were patent as of October 2022.  Continue aspirin and statin therapy.  Medication Adjustments/Labs and Tests Ordered: Current medicines are reviewed at length with the patient today.  Concerns regarding medicines are outlined above.   Tests Ordered: Orders Placed This Encounter  Procedures   Basic metabolic panel    Medication Changes: Meds ordered this encounter  Medications   sacubitril-valsartan (ENTRESTO) 24-26 MG    Sig: Take 1 tablet by mouth 2 (two) times daily.    Dispense:  180 tablet    Refill:  3    Disposition:  Follow up  6 months.  Signed, Satira Sark, MD, [facility] 06/01/2022 5:06 PM    [facility] [place], [place], [place] [address] Phone: [phone]; Fax: [phone]

## 2022-06-01 NOTE — Patient Instructions (Addendum)
Medication Instructions:  Your physician recommends that you continue on your current medications as directed. Please refer to the Current Medication list given to you today.  Labwork: BMET as soon as possible Non-fasting  Testing/Procedures: none  Follow-Up: Your physician recommends that you schedule a follow-up appointment in: 6 months  Any Other Special Instructions Will Be Listed Below (If Applicable).  If you need a refill on your cardiac medications before your next appointment, please call your pharmacy.

## 2022-06-05 LAB — BASIC METABOLIC PANEL
BUN/Creatinine Ratio: 24 (ref 10–24)
BUN: 27 mg/dL (ref 8–27)
CO2: 30 mmol/L — ABNORMAL HIGH (ref 20–29)
Calcium: 9.1 mg/dL (ref 8.6–10.2)
Chloride: 100 mmol/L (ref 96–106)
Creatinine, Ser: 1.13 mg/dL (ref 0.76–1.27)
Glucose: 151 mg/dL — ABNORMAL HIGH (ref 70–99)
Potassium: 4.5 mmol/L (ref 3.5–5.2)
Sodium: 145 mmol/L — ABNORMAL HIGH (ref 134–144)
eGFR: 68 mL/min/{1.73_m2} (ref >59)

## 2022-06-23 ENCOUNTER — Telehealth: Payer: Self-pay | Admitting: Pulmonary Disease

## 2022-06-23 ENCOUNTER — Encounter: Payer: Self-pay | Admitting: Cardiology

## 2022-06-23 ENCOUNTER — Other Ambulatory Visit: Payer: Self-pay | Admitting: *Deleted

## 2022-06-23 ENCOUNTER — Telehealth: Payer: Self-pay | Admitting: Cardiology

## 2022-06-23 MED ORDER — TRELEGY ELLIPTA 100-62.5-25 MCG/ACT IN AEPB: 1.0000 | INHALATION_SPRAY | Freq: Every day | RESPIRATORY_TRACT | 3 refills | Status: DC

## 2022-06-23 NOTE — Telephone Encounter (Signed)
Called [facility] at the number provided  They advised that the rx can not be called in  Must be faxed to them at [phone]  I have printed and given to beth to sign  This will be faxed today  Pt aware  Nothing further needed

## 2022-06-23 NOTE — Telephone Encounter (Signed)
Farxiga 10 mg prescription faxed to [facility].

## 2022-06-23 NOTE — Telephone Encounter (Signed)
PT calling because his [facility] RX was dcln at Pharmacy. Please call to advise. Pharm is PT assist Program w/[facility]. Pharm # is [phone]  He says his RX script has expired and would like it called in ASAP because he's Is out. If he does not get by tomorrow he is in trouble because the [place] and delivery method will hold it up. Making High Priority due to his concerns. His CB # is [phone].

## 2022-06-23 NOTE — Telephone Encounter (Signed)
  Pt is calling back, he said a prescription Wilder Glade needs to be sent to   [facility]  phone# [phone]  He got approved for pt assistance. He is requesting to be sent today

## 2022-06-23 NOTE — Telephone Encounter (Signed)
Pt c/o medication issue:  1. Name of Medication:   FARXIGA 10 MG TABS tablet    2. How are you currently taking this medication (dosage and times per day)?   Apply 1 application topically in the morning, at noon, in the evening, and at bedtime.    3. Are you having a reaction (difficulty breathing--STAT)? no  4. What is your medication issue? Patient calling in stating that he needs his medication order through the assistant program. Please advise

## 2022-06-23 NOTE — Telephone Encounter (Signed)
error 

## 2022-06-25 ENCOUNTER — Other Ambulatory Visit: Payer: Self-pay | Admitting: *Deleted

## 2022-06-25 ENCOUNTER — Encounter: Payer: Self-pay | Admitting: *Deleted

## 2022-06-25 MED ORDER — ENTRESTO 24-26 MG PO TABS: 1.0000 | ORAL_TABLET | Freq: Two times a day (BID) | ORAL | 3 refills | Status: DC

## 2022-06-25 NOTE — Progress Notes (Signed)
Fax notification received from [employer] that entresto 24/26 mg approved through 07/06/2023.  Patient ID# [id number]

## 2022-07-13 ENCOUNTER — Encounter: Payer: Self-pay | Admitting: Internal Medicine

## 2022-08-03 ENCOUNTER — Telehealth: Payer: Self-pay | Admitting: Cardiology

## 2022-08-03 MED ORDER — APIXABAN 5 MG PO TABS: 5.0000 mg | ORAL_TABLET | Freq: Two times a day (BID) | ORAL | 2 refills | Status: DC

## 2022-08-03 NOTE — Telephone Encounter (Signed)
Prescription refill request for Eliquis received. Indication: AF Last office visit: 06/01/22  Myles Gip MD Scr: 1.05 on 07/10/22 Age: 77 Weight: 128.4kg  Based on above findings Eliquis '5mg'$  twice daily is the appropriate dose.  Refill approved.

## 2022-08-03 NOTE — Telephone Encounter (Signed)
*  STAT* If patient is at the pharmacy, call can be transferred to refill team.   1. Which medications need to be refilled? (please list name of each medication and dose if known)  Eliquis  2. Which pharmacy/location (including street and city if local pharmacy) is medication to be sent to?[facility]  [place] [facility]  3. Do they need a 30 day or 90 day supply? 60 days

## 2022-08-06 ENCOUNTER — Ambulatory Visit (INDEPENDENT_AMBULATORY_CARE_PROVIDER_SITE_OTHER): Payer: Medicare Other

## 2022-08-06 DIAGNOSIS — I255 Ischemic cardiomyopathy: Secondary | ICD-10-CM

## 2022-08-06 LAB — CUP PACEART REMOTE DEVICE CHECK
Battery Remaining Longevity: 132 mo
Battery Remaining Percentage: 100 %
Brady Statistic RA Percent Paced: 98 %
Brady Statistic RV Percent Paced: 0 %
Date Time Interrogation Session: 20240201005000
HighPow Impedance: 73 Ohm
Implantable Lead Connection Status: 753985
Implantable Lead Connection Status: 753985
Implantable Lead Connection Status: 753985
Implantable Lead Implant Date: 20221103
Implantable Lead Implant Date: 20221103
Implantable Lead Implant Date: 20221103
Implantable Lead Location: 753858
Implantable Lead Location: 753860
Implantable Lead Location: 753860
Implantable Lead Model: 138
Implantable Lead Model: 3830
Implantable Lead Model: 4671
Implantable Lead Serial Number: 304581
Implantable Lead Serial Number: 852093
Implantable Pulse Generator Implant Date: 20221103
Lead Channel Impedance Value: 515 Ohm
Lead Channel Impedance Value: 556 Ohm
Lead Channel Impedance Value: 568 Ohm
Lead Channel Setting Pacing Amplitude: 2.5 V
Lead Channel Setting Sensing Sensitivity: 0.5 mV
Lead Channel Setting Sensing Sensitivity: 1 mV
Pulse Gen Serial Number: 390312

## 2022-08-26 NOTE — Progress Notes (Signed)
Remote ICD transmission.   

## 2022-09-10 ENCOUNTER — Encounter (HOSPITAL_COMMUNITY): Payer: Self-pay | Admitting: Internal Medicine

## 2022-09-10 ENCOUNTER — Ambulatory Visit (HOSPITAL_COMMUNITY)
Admission: RE | Admit: 2022-09-10 | Discharge: 2022-09-10 | Disposition: A | Discharge status: Home / Self Care | Payer: Medicare Other | Source: Ambulatory Visit | Attending: Internal Medicine | Admitting: Internal Medicine

## 2022-09-10 VITALS — BP 104/60 | HR 70 | Wt 291.0 lb

## 2022-09-10 DIAGNOSIS — Z79899 Other long term (current) drug therapy: Secondary | ICD-10-CM | POA: Insufficient documentation

## 2022-09-10 DIAGNOSIS — E1122 Type 2 diabetes mellitus with diabetic chronic kidney disease: Secondary | ICD-10-CM | POA: Diagnosis not present

## 2022-09-10 DIAGNOSIS — Z951 Presence of aortocoronary bypass graft: Secondary | ICD-10-CM | POA: Diagnosis not present

## 2022-09-10 DIAGNOSIS — I272 Pulmonary hypertension, unspecified: Secondary | ICD-10-CM | POA: Diagnosis not present

## 2022-09-10 DIAGNOSIS — I4821 Permanent atrial fibrillation: Secondary | ICD-10-CM | POA: Diagnosis not present

## 2022-09-10 DIAGNOSIS — Z6841 Body Mass Index (BMI) 40.0 and over, adult: Secondary | ICD-10-CM | POA: Insufficient documentation

## 2022-09-10 DIAGNOSIS — I2510 Atherosclerotic heart disease of native coronary artery without angina pectoris: Secondary | ICD-10-CM | POA: Diagnosis not present

## 2022-09-10 DIAGNOSIS — G4733 Obstructive sleep apnea (adult) (pediatric): Secondary | ICD-10-CM | POA: Diagnosis not present

## 2022-09-10 DIAGNOSIS — Z95810 Presence of automatic (implantable) cardiac defibrillator: Secondary | ICD-10-CM

## 2022-09-10 DIAGNOSIS — Z7901 Long term (current) use of anticoagulants: Secondary | ICD-10-CM | POA: Diagnosis not present

## 2022-09-10 DIAGNOSIS — I447 Left bundle-branch block, unspecified: Secondary | ICD-10-CM | POA: Diagnosis not present

## 2022-09-10 DIAGNOSIS — I130 Hypertensive heart and chronic kidney disease with heart failure and stage 1 through stage 4 chronic kidney disease, or unspecified chronic kidney disease: Secondary | ICD-10-CM | POA: Insufficient documentation

## 2022-09-10 DIAGNOSIS — I5022 Chronic systolic (congestive) heart failure: Secondary | ICD-10-CM

## 2022-09-10 DIAGNOSIS — I255 Ischemic cardiomyopathy: Secondary | ICD-10-CM | POA: Insufficient documentation

## 2022-09-10 DIAGNOSIS — E1140 Type 2 diabetes mellitus with diabetic neuropathy, unspecified: Secondary | ICD-10-CM | POA: Diagnosis not present

## 2022-09-10 MED ORDER — LOSARTAN POTASSIUM 50 MG PO TABS: 50.0000 mg | ORAL_TABLET | Freq: Every day | ORAL | 3 refills | Status: DC

## 2022-09-10 NOTE — Patient Instructions (Signed)
STOP Entresto  START Losartan 50 mg daily.  Your physician has requested that you have an echocardiogram. Echocardiography is a painless test that uses sound waves to create images of your heart. It provides your doctor with information about the size and shape of your heart and how well your heart's chambers and valves are working. This procedure takes approximately one hour. There are no restrictions for this procedure. Please do NOT wear cologne, perfume, aftershave, or lotions (deodorant is allowed). Please arrive 15 minutes prior to your appointment time.  Your physician recommends that you schedule a follow-up appointment in: 6 months with an echocardiogram (September) ** please call the office in July to arrange your follow up appointment. **  If you have any questions or concerns before your next appointment please send [place] a message through [place] or call our office at [phone].    TO LEAVE A MESSAGE FOR THE NURSE SELECT OPTION 2, PLEASE LEAVE A MESSAGE INCLUDING: YOUR NAME DATE OF BIRTH CALL BACK NUMBER REASON FOR CALL**this is important as we prioritize the call backs  YOU WILL RECEIVE A CALL BACK THE SAME DAY AS LONG AS YOU CALL BEFORE 4:00 PM  At the [facility], you and your health needs are our priority. As part of our continuing mission to provide you with exceptional heart care, we have created designated Provider Care Teams. These Care Teams include your primary Cardiologist (physician) and Advanced Practice Providers (APPs- Physician Assistants and Nurse Practitioners) who all work together to provide you with the care you need, when you need it.   You may see any of the following providers on your designated Care Team at your next follow up: Dr Glori Bickers Dr Loralie Champagne Dr. Roxana Hires, NP Lyda Jester, [place] [facility], [place] Forestine Na, NP Audry Riles, PharmD   Please be sure to bring in all your  medications bottles to every appointment.    Thank you for choosing [facility]

## 2022-09-10 NOTE — Progress Notes (Signed)
ADVANCED HF CLINIC NOTE   Primary Care: Pomposini, [name], MD HF Cardiologist: Dr. Justin Mend cardiologist: Domenic Polite  HPI: Tommy Riley is a 77 y.o. male with history of chronic systolic HF, ischemic cardiomyopathy, CAD s/p CABG in 2001, permanent atrial fibrillation s/p atrial fibrillation and atrial flutter ablation in 2001, OSA on CPAP, DM II.    Previously seen by Dr. Gilles Chiquito at [facility] for pulmonary hypertension in 2018. Felt to be multifactorial and likely combination of WHO groups 2 and 3.    Admitted 10/22 with a/c HF. Echo with LVEF 35-40%, RV mildly reduced, RVSP 66 mmHg, trivial MR.Diuresed with IV lasix, diamox, and metolazone.  Marland Kitchen Has LBBB with wide QRS. Seen by EP and unclear how much he would benefit from CRT. Underwent L/RHC  with patent grafts (Y graft from LIMA to distal LAD and distal circumflex marginal branch, vein graft to PDA) and showed pulmonary hypertension, CO preserved. Pulmonary HTN felt be d/t combination of WHO Group II and III. cMRI 11/02 with evidence of mixed ischemic and nonischemic cardiomyopathy. LVEF 34%. Underwent CRT-D on 11/02, did not get AV nodal ablation. IV lasix switched to Torsemide. Counseled extensively on cutting back fluid intake. Continued on GDMT with digoxin, spironolactone, entresto, farxiga and metoprolol succinate.  Discharge weight 280 lbs.  Admitted in 2/23 with recurrent HF in setting of AF. Underwent diuresis and AV node ablation by Dr. Lovena Le.  Echo 10/22 EF 35-40% RV mildly reduced   Saw Dr. Lovena Le 4/23 and device reprogrammed rate from 80 down to 70.   Echo 03/18/22 EF 40-45%  Today he returns for HF follow up with his wife. Says his worst things are his breathing and his neuropathy. Gets SOB with any movement. No improvement with CRT. Has severe neuropathy. Has gained about 20 pounds. Wife says he doesn't move much and eats all the time. Denies CP or LE edema.    Past Medical History:  Diagnosis Date   Anxiety     Atrial fibrillation and flutter ([place])    Atrial fibrillation ablation 2011 at [facility]   CAD in native artery    Chronic systolic heart failure (HCC)    CKD (chronic kidney disease), stage III (HCC)    COPD (chronic obstructive pulmonary disease) ([place])    COVID-22 February 2020   Depression    Essential hypertension    Gout    Hyperlipidemia    Ischemic heart disease    OSA (obstructive sleep apnea)    Stroke ([place]) 2003   Type 2 diabetes mellitus (HCC)    Current Outpatient Medications  Medication Sig Dispense Refill   ACCU-CHEK AVIVA PLUS test strip      albuterol (PROVENTIL) (2.5 MG/3ML) 0.083% nebulizer solution Take 3 mLs (2.5 mg total) by nebulization every 6 (six) hours as needed for wheezing or shortness of breath. 75 mL 4   albuterol (VENTOLIN HFA) 108 (90 Base) MCG/ACT inhaler USE 2 INHALATIONS BY MOUTH  EVERY 4 HOURS AS NEEDED FOR SHORTNESS OF BREATH OR  WHEEZE 51 g 3   allopurinol (ZYLOPRIM) 100 MG tablet Taking two tablets by mouth in the morning and 1 tablet by mouth in the evening     apixaban (ELIQUIS) 5 MG TABS tablet Take 1 tablet (5 mg total) by mouth 2 (two) times daily. 120 tablet 2   Ascorbic Acid (VITAMIN C) 500 MG CAPS Take 500 mg by mouth daily.     aspirin 81 MG EC tablet Take 81 mg by mouth  in the morning.     Blood Glucose Monitoring Suppl (ONE TOUCH ULTRA 2) W/DEVICE KIT by Does not apply route.     Cholecalciferol (VITAMIN D3) 2000 UNITS TABS Take 1 capsule by mouth in the morning and at bedtime.     diphenhydramine-acetaminophen (TYLENOL PM) 25-500 MG TABS tablet Take 2 tablets by mouth at bedtime.     ezetimibe (ZETIA) 10 MG tablet Take 10 mg by mouth in the morning.     FARXIGA 10 MG TABS tablet Take 1 tablet (10 mg total) by mouth daily. 30 tablet 90   fluorouracil (EFUDEX) 5 % cream Apply 1 application topically in the morning, at noon, in the evening, and at bedtime.     Fluticasone-Umeclidin-Vilant (TRELEGY ELLIPTA) 100-62.5-25 MCG/ACT AEPB Inhale 1  puff into the lungs daily. 99991111 each 3   folic acid (FOLVITE) A999333 MCG tablet Take 400 mcg by mouth every evening.     HUMALOG KWIKPEN 200 UNIT/ML KwikPen 20 Units as needed. On a sliding scale     insulin detemir (LEVEMIR) 100 UNIT/ML injection Inject 50 Units into the skin 2 (two) times daily. On a sliding scale     loratadine (CLARITIN) 10 MG tablet Take 10 mg by mouth in the morning.     melatonin 5 MG TABS Take 5 mg by mouth at bedtime.     metoprolol (TOPROL-XL) 200 MG 24 hr tablet Take 200 mg by mouth daily.     Multiple Vitamins-Minerals (CENTRUM CARDIO) TABS Take 1 tablet by mouth in the morning.     omega-3 acid ethyl esters (LOVAZA) 1 g capsule Take 2 g by mouth 2 (two) times daily.     oxymetazoline (AFRIN) 0.05 % nasal spray Place 1 spray into both nostrils as needed for congestion.     potassium chloride (KLOR-CON) 20 MEQ packet Take 60 mEq by mouth 3 (three) times daily.     RELION PEN NEEDLE 31G/8MM 31G X 8 MM MISC 3 (three) times daily.     rosuvastatin (CRESTOR) 40 MG tablet TAKE 1 TABLET BY MOUTH DAILY 100 tablet 1   sacubitril-valsartan (ENTRESTO) 24-26 MG Take 1 tablet by mouth 2 (two) times daily. 180 tablet 3   sertraline (ZOLOFT) 100 MG tablet Take 150 mg by mouth in the morning.     spironolactone (ALDACTONE) 25 MG tablet TAKE 1 TABLET BY MOUTH DAILY 100 tablet 1   torsemide (DEMADEX) 20 MG tablet TAKE 4 TABLETS BY MOUTH 2 TIMES  DAILY. MAY TAKE AN ADDITIONAL 1  TABLET DAILY AS NEEDED FOR 2-3  LBS WEIGHT GAIN IN ONE DAY OR 5  LBS IN 1 WEEK 900 tablet 1   zinc sulfate 220 (50 Zn) MG capsule Take 220 mg by mouth every evening.     No current facility-administered medications for this encounter.   Allergies  Allergen Reactions   Codeine Anaphylaxis, Shortness Of Breath and Other (See Comments)    Tightness in chest    Social History   Socioeconomic History   Marital status: Married    Spouse name: Wyman Franta   Number of children: 3   Years of education: 14    Highest education level: Some college, no degree  Occupational History    Comment: retired  Tobacco Use   Smoking status: Former    Packs/day: 2.50    Years: 50.00    Total pack years: 125.00    Types: Cigarettes    Quit date: 06/17/2011    Years since quitting: 11.2  Smokeless tobacco: Never  Vaping Use   Vaping Use: Some days   Start date: 07/06/2017   Last attempt to quit: 04/19/2021   Substances: Flavoring  Substance and Sexual Activity   Alcohol use: No    Comment: 1 beer a month   Drug use: No   Sexual activity: Not on file  Other Topics Concern   Not on file  Social History Narrative   Consumes no caffeine   Social Determinants of Health   Financial Resource Strain: Low Risk  (05/01/2021)   Overall Financial Resource Strain (CARDIA)    Difficulty of Paying Living Expenses: Not hard at all  Food Insecurity: No Food Insecurity (05/01/2021)   Hunger Vital Sign    Worried About Running Out of Food in the Last Year: Never true    Ran Out of Food in the Last Year: Never true  Transportation Needs: No Transportation Needs (05/01/2021)   PRAPARE - Hydrologist (Medical): No    Lack of Transportation (Non-Medical): No  Physical Activity: Not on file  Stress: Not on file  Social Connections: Not on file  Intimate Partner Violence: Not on file   Family History  Problem Relation Age of Onset   Raynaud syndrome Mother    Stroke Father    Diabetes Mellitus II Father    Hypertension Father    Hypertension Sister    Diabetes Mellitus II Sister    Stroke Brother    Crohn's disease Brother    Breast cancer Brother    COPD Brother    BP 104/60   Pulse 70   Wt 132 kg (291 lb)   SpO2 96%   BMI 40.59 kg/m   Wt Readings from Last 3 Encounters:  09/10/22 132 kg (291 lb)  06/01/22 128.4 kg (283 lb)  03/18/22 126.6 kg (279 lb)   PHYSICAL EXAM: General:  Obese male sitting in WC on O2 HEENT: normal Neck: supple. no JVD. Carotids 2+  bilat; no bruits. No lymphadenopathy or thryomegaly appreciated. Cor: PMI nondisplaced. Regular rate & rhythm. No rubs, gallops or murmurs. Lungs: clear Abdomen: obese  soft, nontender, nondistended. No hepatosplenomegaly. No bruits or masses. Good bowel sounds. Extremities: no cyanosis, clubbing, rash, edema + compression socks Neuro: alert & orientedx3, cranial nerves grossly intact. moves all 4 extremities w/o difficulty. Affect pleasant   Device interrogation: HL score 1, thoracic impedence ok, no AF/VT, activity level 0.3 hr/day (Personally reviewed)  ASSESSMENT & PLAN:  1.Chronic systolic HF: - EF 123456 on echo, RV mildly reduced - Echo 03/18/22 EF 40-45% - Etiology of LV dysfunction unclear but cMRI suggest mixed ischemic/nonischemic CM - s/p [employer] CRT-D. Device interrogated personally in clinic as above.  - Stable NYHA III - mostly limited by lung disease and obesity. Volume OK - Continue torsemide 80 mg bid + 40 KCL bid. - Switch Entresto 24/26 bid to losartan 50 daily due to cost (wants financial room to afford Ozempic) - Continue spironolactone 25 mg daily.  - Continue Toprol 200 mg daily. - Continue Farxiga 10 mg daily.  - Watch fluid in take   2. CAD s/p CABG - Grafts patent on cath 05/05/21. - No s/s angina - Continue ASA + beta blocker + statin.   3. Pulmonary HTN - RHC 05/05/21:  RA 14 RV 68/7 PA 68/unrecorded (40) PCWP unobtained LVEDP 21 Fick  4.9/2.0  PVR (using LVEDP) = 3.9 WU - Mixed PH WHO Group II & III. - Continue torsemide and CPAP.  -  Needs weight loss.See below   4. Chronic AF with LBBB and intermittent rate control  - Has been in AF > 10 years despite previous AF/AFL ablations - Now s/p AVN ablation and CRT-D implant 11/22. - Now off digoxin. - CHA2DS2-VASc score is 7. - Continue Eliquis 5 bid. No bleeding   5. OSA - On CPAP. -Continue CPAP   6. Morbid obesity - Body mass index is 40.59 kg/m. - Needs weight loss.  - Agree with starting  Ozempic  7. Neuropathy - Followed by his Neurologist, Dr. Rexene Alberts.  8. DM2 - Per PCP - Continue Rhodia Albright, MD  11:49 AM

## 2022-09-11 ENCOUNTER — Telehealth: Payer: Self-pay | Admitting: Cardiology

## 2022-09-11 DIAGNOSIS — I5022 Chronic systolic (congestive) heart failure: Secondary | ICD-10-CM

## 2022-09-11 DIAGNOSIS — Z79899 Other long term (current) drug therapy: Secondary | ICD-10-CM

## 2022-09-11 MED ORDER — LOSARTAN POTASSIUM 50 MG PO TABS: 50.0000 mg | ORAL_TABLET | Freq: Every day | ORAL | 3 refills | Status: DC

## 2022-09-11 MED ORDER — POTASSIUM CHLORIDE CRYS ER 20 MEQ PO TBCR: 60.0000 meq | EXTENDED_RELEASE_TABLET | Freq: Three times a day (TID) | ORAL | 6 refills | Status: DC

## 2022-09-11 NOTE — Telephone Encounter (Signed)
*  STAT* If patient is at the pharmacy, call can be transferred to refill team.   1. Which medications need to be refilled? (please list name of each medication and dose if known)   potassium chloride (KLOR-CON) 20 MEQ packet  losartan (COZAAR) 50 MG tablet   2. Which pharmacy/location (including street and city if local pharmacy) is medication to be sent to?  [place], [place]   3. Do they need a 30 day or 90 day supply?   90 day  Patient states he still has some medication left.

## 2022-09-11 NOTE — Telephone Encounter (Signed)
Contacted to verify dose, directions and form of potassium. Says he takes potassium 60 meq three times daily. Medication profile updated.

## 2022-09-16 ENCOUNTER — Other Ambulatory Visit: Payer: Self-pay | Admitting: *Deleted

## 2022-09-16 DIAGNOSIS — I5022 Chronic systolic (congestive) heart failure: Secondary | ICD-10-CM

## 2022-09-16 DIAGNOSIS — Z79899 Other long term (current) drug therapy: Secondary | ICD-10-CM

## 2022-09-16 NOTE — Telephone Encounter (Signed)
Wife informed and verbalized understanding. Will pick up lab order tomorrow and have done at [facility]

## 2022-09-16 NOTE — Addendum Note (Signed)
Addended by: Merlene Laughter on: 09/16/2022 11:26 AM   Modules accepted: Orders

## 2022-09-19 LAB — BASIC METABOLIC PANEL
BUN/Creatinine Ratio: 18 (ref 10–24)
BUN: 23 mg/dL (ref 8–27)
CO2: 26 mmol/L (ref 20–29)
Calcium: 9.3 mg/dL (ref 8.6–10.2)
Chloride: 102 mmol/L (ref 96–106)
Creatinine, Ser: 1.27 mg/dL (ref 0.76–1.27)
Glucose: 147 mg/dL — ABNORMAL HIGH (ref 70–99)
Potassium: 4.5 mmol/L (ref 3.5–5.2)
Sodium: 146 mmol/L — ABNORMAL HIGH (ref 134–144)
eGFR: 59 mL/min/{1.73_m2} — ABNORMAL LOW (ref >59)

## 2022-10-01 ENCOUNTER — Encounter: Payer: Self-pay | Admitting: Internal Medicine

## 2022-10-12 ENCOUNTER — Other Ambulatory Visit: Payer: Self-pay | Admitting: *Deleted

## 2022-10-12 MED ORDER — TRELEGY ELLIPTA 100-62.5-25 MCG/ACT IN AEPB: 1.0000 | INHALATION_SPRAY | Freq: Every day | RESPIRATORY_TRACT | 3 refills | Status: DC

## 2022-10-26 ENCOUNTER — Other Ambulatory Visit: Payer: Self-pay | Admitting: Cardiology

## 2022-10-27 ENCOUNTER — Encounter: Payer: Self-pay | Admitting: Internal Medicine

## 2022-10-27 ENCOUNTER — Ambulatory Visit: Payer: Medicare Other | Attending: Internal Medicine | Admitting: Internal Medicine

## 2022-10-27 VITALS — BP 104/58 | HR 74 | Ht 71.5 in | Wt 279.0 lb

## 2022-10-27 DIAGNOSIS — I5022 Chronic systolic (congestive) heart failure: Secondary | ICD-10-CM

## 2022-10-27 NOTE — Patient Instructions (Signed)
Medication Instructions:  Your physician recommends that you continue on your current medications as directed. Please refer to the Current Medication list given to you today.  *If you need a refill on your cardiac medications before your next appointment, please call your pharmacy*   Lab Work: NONE   If you have labs (blood work) drawn today and your tests are completely normal, you will receive your results only by: MyChart Message (if you have MyChart) OR A paper copy in the mail If you have any lab test that is abnormal or we need to change your treatment, we will call you to review the results.   Testing/Procedures: NONE    Follow-Up: At [facility], you and your health needs are our priority.  As part of our continuing mission to provide you with exceptional heart care, we have created designated Provider Care Teams.  These Care Teams include your primary Cardiologist (physician) and Advanced Practice Providers (APPs -  Physician Assistants and Nurse Practitioners) who all work together to provide you with the care you need, when you need it.  We recommend signing up for the patient portal called "MyChart".  Sign up information is provided on this After Visit Summary.  MyChart is used to connect with patients for Virtual Visits (Telemedicine).  Patients are able to view lab/test results, encounter notes, upcoming appointments, etc.  Non-urgent messages can be sent to your provider as well.   To learn more about what you can do with MyChart, go to [email or web address].    Your next appointment:   1 year(s)  Provider:   Gregg Taylor, MD    Other Instructions Thank you for choosing  HeartCare!    

## 2022-10-27 NOTE — Progress Notes (Signed)
HPI [name] returns today for followup. He is a pleasant 77 yo man with an ICM, s/p remote CABG, perm atrial fib with a RVR, who underwent biv ICD insertion. His VR could not be controlled and he underwent AV node ablation a few months ago. He is better. He admits to dietary indiscretion. He admits to being sedentary.   Allergies  Allergen Reactions   Codeine Anaphylaxis, Shortness Of Breath and Other (See Comments)    Tightness in chest      Current Outpatient Medications  Medication Sig Dispense Refill   ACCU-CHEK AVIVA PLUS test strip      albuterol (PROVENTIL) (2.5 MG/3ML) 0.083% nebulizer solution Take 3 mLs (2.5 mg total) by nebulization every 6 (six) hours as needed for wheezing or shortness of breath. 75 mL 4   albuterol (VENTOLIN HFA) 108 (90 Base) MCG/ACT inhaler USE 2 INHALATIONS BY MOUTH  EVERY 4 HOURS AS NEEDED FOR SHORTNESS OF BREATH OR  WHEEZE 51 g 3   allopurinol (ZYLOPRIM) 100 MG tablet Taking two tablets by mouth in the morning and 1 tablet by mouth in the evening     apixaban (ELIQUIS) 5 MG TABS tablet Take 1 tablet (5 mg total) by mouth 2 (two) times daily. 120 tablet 2   Ascorbic Acid (VITAMIN C) 500 MG CAPS Take 500 mg by mouth daily.     aspirin 81 MG EC tablet Take 81 mg by mouth in the morning.     Blood Glucose Monitoring Suppl (ONE TOUCH ULTRA 2) W/DEVICE KIT by Does not apply route.     Cholecalciferol (VITAMIN D3) 2000 UNITS TABS Take 1 capsule by mouth in the morning and at bedtime.     diphenhydramine-acetaminophen (TYLENOL PM) 25-500 MG TABS tablet Take 2 tablets by mouth at bedtime.     ezetimibe (ZETIA) 10 MG tablet Take 10 mg by mouth in the morning.     FARXIGA 10 MG TABS tablet Take 1 tablet (10 mg total) by mouth daily. 30 tablet 90   fluorouracil (EFUDEX) 5 % cream Apply 1 application topically in the morning, at noon, in the evening, and at bedtime.     Fluticasone-Umeclidin-Vilant (TRELEGY ELLIPTA) 100-62.5-25 MCG/ACT AEPB Inhale 1 puff  into the lungs daily. 180 each 3   folic acid (FOLVITE) 400 MCG tablet Take 400 mcg by mouth every evening.     HUMALOG KWIKPEN 200 UNIT/ML KwikPen 20 Units as needed. On a sliding scale     insulin detemir (LEVEMIR) 100 UNIT/ML injection Inject 50 Units into the skin 2 (two) times daily. On a sliding scale     loratadine (CLARITIN) 10 MG tablet Take 10 mg by mouth in the morning.     losartan (COZAAR) 50 MG tablet Take 1 tablet (50 mg total) by mouth daily. 90 tablet 3   melatonin 5 MG TABS Take 5 mg by mouth at bedtime.     metoprolol (TOPROL-XL) 200 MG 24 hr tablet Take 200 mg by mouth daily.     Multiple Vitamins-Minerals (CENTRUM CARDIO) TABS Take 1 tablet by mouth in the morning.     omega-3 acid ethyl esters (LOVAZA) 1 g capsule Take 2 g by mouth 2 (two) times daily.     oxymetazoline (AFRIN) 0.05 % nasal spray Place 1 spray into both nostrils as needed for congestion.     OZEMPIC, 0.25 OR 0.5 MG/DOSE, 2 MG/3ML SOPN Inject into the skin.     potassium chloride SA (KLOR-CON M) 20  MEQ tablet Take 3 tablets (60 mEq total) by mouth 3 (three) times daily. 270 tablet 6   RELION PEN NEEDLE 31G/8MM 31G X 8 MM MISC 3 (three) times daily.     rosuvastatin (CRESTOR) 40 MG tablet TAKE 1 TABLET BY MOUTH DAILY 100 tablet 1   sertraline (ZOLOFT) 100 MG tablet Take 150 mg by mouth in the morning.     spironolactone (ALDACTONE) 25 MG tablet TAKE 1 TABLET BY MOUTH DAILY 100 tablet 1   torsemide (DEMADEX) 20 MG tablet TAKE 4 TABLETS BY MOUTH TWICE  DAILY MAY TAKE AN ADDITIONAL 1  TABLET DAILY AS NEEDED FOR 2 TO  3 LBS WEIGHT GAIN IN 1 DAY OR 5  LBS IN 1 WEEK 900 tablet 1   zinc sulfate 220 (50 Zn) MG capsule Take 220 mg by mouth every evening.     No current facility-administered medications for this visit.     Past Medical History:  Diagnosis Date   Anxiety    Atrial fibrillation and flutter    Atrial fibrillation ablation 2011 at [facility]   CAD in native artery    Chronic systolic heart failure     CKD (chronic kidney disease), stage III    COPD (chronic obstructive pulmonary disease)    COVID-22 February 2020   Depression    Essential hypertension    Gout    Hyperlipidemia    Ischemic heart disease    OSA (obstructive sleep apnea)    Stroke 2003   Type 2 diabetes mellitus     ROS:   All systems reviewed and negative except as noted in the HPI.   Past Surgical History:  Procedure Laterality Date   ATRIAL FIBRILLATION ABLATION  2011   [facility]   AV NODE ABLATION N/A 05/07/2021   Procedure: AV NODE ABLATION;  Surgeon: Marinus Maw, MD;  Location: [facility] INVASIVE CV LAB;  Service: Cardiovascular;  Laterality: N/A;   AV NODE ABLATION N/A 09/01/2021   Procedure: AV NODE ABLATION;  Surgeon: Marinus Maw, MD;  Location: [facility] INVASIVE CV LAB;  Service: Cardiovascular;  Laterality: N/A;   CATARACT EXTRACTION Bilateral    CORONARY ARTERY BYPASS GRAFT  2001   HERNIA REPAIR Right    ICD IMPLANT N/A 05/07/2021   Procedure: ICD IMPLANT;  Surgeon: Marinus Maw, MD;  Location[facility] INVASIVE CV LAB;  Service: Cardiovascular;  Laterality: N/A;   RIGHT/LEFT HEART CATH AND CORONARY/GRAFT ANGIOGRAPHY N/A 05/05/2021   Procedure: RIGHT/LEFT HEART CATH AND CORONARY/GRAFT ANGIOGRAPHY;  Surgeon: Runell Gess, MD;  Location: [facility] INVASIVE CV LAB;  Service: Cardiovascular;  Laterality: N/A;   TONSILLECTOMY AND ADENOIDECTOMY       Family History  Problem Relation Age of Onset   Raynaud syndrome Mother    Stroke Father    Diabetes Mellitus II Father    Hypertension Father    Hypertension Sister    Diabetes Mellitus II Sister    Stroke Brother    Crohn's disease Brother    Breast cancer Brother    COPD Brother      Social History   Socioeconomic History   Marital status: Married    Spouse name: Lamount Bankson   Number of children: 3   Years of education: 14   Highest education level: Some college, no degree  Occupational History    Comment: retired  Tobacco Use   Smoking status:  Former    Packs/day: 2.50    Years: 50.00    Additional  pack years: 0.00    Total pack years: 125.00    Types: Cigarettes    Quit date: 06/17/2011    Years since quitting: 11.3   Smokeless tobacco: Never  Vaping Use   Vaping Use: Former   Start date: 07/06/2017   Quit date: 04/19/2021   Substances: Flavoring  Substance and Sexual Activity   Alcohol use: No    Comment: 1 beer a month   Drug use: No   Sexual activity: Not on file  Other Topics Concern   Not on file  Social History Narrative   Consumes no caffeine   Social Determinants of Health   Financial Resource Strain: Low Risk  (05/01/2021)   Overall Financial Resource Strain (CARDIA)    Difficulty of Paying Living Expenses: Not hard at all  Food Insecurity: No Food Insecurity (05/01/2021)   Hunger Vital Sign    Worried About Running Out of Food in the Last Year: Never true    Ran Out of Food in the Last Year: Never true  Transportation Needs: No Transportation Needs (05/01/2021)   PRAPARE - Administrator, Civil Service (Medical): No    Lack of Transportation (Non-Medical): No  Physical Activity: Not on file  Stress: Not on file  Social Connections: Not on file  Intimate Partner Violence: Not on file     BP (!) 104/58   Pulse 74   Ht 5' 11.5" (1.816 m)   Wt 279 lb (126.6 kg)   SpO2 95%   BMI 38.37 kg/m   Physical Exam:  Well appearing NAD HEENT: Unremarkable Neck:  No JVD, no thyromegally Lymphatics:  No adenopathy Back:  No CVA tenderness Lungs:  Clear HEART:  Regular rate rhythm, no murmurs, no rubs, no clicks Abd:  soft, positive bowel sounds, no organomegally, no rebound, no guarding Ext:  2 plus pulses, no edema, no cyanosis, no clubbing; support stockings are in place. Skin:  No rashes no nodules Neuro:  CN II through XII intact, motor grossly intact  DEVICE  Normal device function.  See PaceArt for details.   Assess/Plan:  Uncontrolled atrial fib - he is s/p AV node ablation  and his VR is controlled.  ICD - today we reprogrammed his rate from 80 down to 70/min.  Chronic systolic heart failure - his symptoms are improved. His heart logic is 0. He will continue his current meds under the direction of the CHF clinic and I have strongly encouraged the patient to avoid salty foods.  Sleep apnea - he denies non-compliance with his CPAP. Continue.   Sharlot Gowda Rilyn Scroggs,MD

## 2022-11-04 ENCOUNTER — Ambulatory Visit: Payer: Medicare Other | Admitting: Pulmonary Disease

## 2022-11-05 ENCOUNTER — Ambulatory Visit (INDEPENDENT_AMBULATORY_CARE_PROVIDER_SITE_OTHER): Payer: Medicare Other

## 2022-11-05 DIAGNOSIS — I255 Ischemic cardiomyopathy: Secondary | ICD-10-CM | POA: Diagnosis not present

## 2022-11-05 LAB — CUP PACEART REMOTE DEVICE CHECK
Battery Remaining Longevity: 126 mo
Battery Remaining Percentage: 100 %
Brady Statistic RA Percent Paced: 99 %
Brady Statistic RV Percent Paced: 0 %
Date Time Interrogation Session: 20240502015600
HighPow Impedance: 77 Ohm
Implantable Lead Connection Status: 753985
Implantable Lead Connection Status: 753985
Implantable Lead Connection Status: 753985
Implantable Lead Implant Date: 20221103
Implantable Lead Implant Date: 20221103
Implantable Lead Implant Date: 20221103
Implantable Lead Location: 753858
Implantable Lead Location: 753860
Implantable Lead Location: 753860
Implantable Lead Model: 138
Implantable Lead Model: 3830
Implantable Lead Model: 4671
Implantable Lead Serial Number: 304581
Implantable Lead Serial Number: 852093
Implantable Pulse Generator Implant Date: 20221103
Lead Channel Impedance Value: 556 Ohm
Lead Channel Impedance Value: 558 Ohm
Lead Channel Impedance Value: 751 Ohm
Lead Channel Setting Pacing Amplitude: 2.5 V
Lead Channel Setting Sensing Sensitivity: 0.5 mV
Lead Channel Setting Sensing Sensitivity: 1 mV
Pulse Gen Serial Number: 390312

## 2022-11-06 ENCOUNTER — Ambulatory Visit: Payer: Medicare Other | Admitting: Pulmonary Disease

## 2022-11-11 ENCOUNTER — Encounter: Payer: Self-pay | Admitting: Nurse Practitioner

## 2022-11-12 ENCOUNTER — Other Ambulatory Visit: Payer: Self-pay | Admitting: Cardiology

## 2022-11-25 NOTE — Progress Notes (Signed)
Remote ICD transmission.   

## 2022-11-26 ENCOUNTER — Encounter (INDEPENDENT_AMBULATORY_CARE_PROVIDER_SITE_OTHER): Payer: Self-pay | Admitting: *Deleted

## 2022-12-08 ENCOUNTER — Telehealth: Payer: Self-pay | Admitting: *Deleted

## 2022-12-08 NOTE — Telephone Encounter (Signed)
Patient called and states he is in the donut hole for Trelegy and would like to get a prescription mailed to his home. Please call and advise [phone]

## 2022-12-09 MED ORDER — TRELEGY ELLIPTA 100-62.5-25 MCG/ACT IN AEPB: 1.0000 | INHALATION_SPRAY | Freq: Every day | RESPIRATORY_TRACT | 6 refills | Status: DC

## 2022-12-09 NOTE — Telephone Encounter (Signed)
Called and spoke w/ pt he verbalized that he is currently needing a paper prescription sent to his home. I have printed prescription and will mail today. NFN att.

## 2022-12-16 ENCOUNTER — Encounter: Payer: Self-pay | Admitting: Cardiology

## 2022-12-16 ENCOUNTER — Ambulatory Visit: Payer: Medicare Other | Attending: Cardiology | Admitting: Cardiology

## 2022-12-16 VITALS — BP 119/69 | HR 71 | Ht 71.5 in | Wt 278.0 lb

## 2022-12-16 DIAGNOSIS — I5022 Chronic systolic (congestive) heart failure: Secondary | ICD-10-CM | POA: Diagnosis not present

## 2022-12-16 DIAGNOSIS — I4821 Permanent atrial fibrillation: Secondary | ICD-10-CM | POA: Diagnosis not present

## 2022-12-16 DIAGNOSIS — Z95810 Presence of automatic (implantable) cardiac defibrillator: Secondary | ICD-10-CM

## 2022-12-16 DIAGNOSIS — I25119 Atherosclerotic heart disease of native coronary artery with unspecified angina pectoris: Secondary | ICD-10-CM | POA: Diagnosis not present

## 2022-12-16 NOTE — Progress Notes (Signed)
Cardiology Office Note  Date: 12/16/2022   ID: Tommy Riley, DOB 30-Nov-1945, MRN [id number]  History of Present Illness: Tommy Riley is a 77 y.o. male last seen in November 2023.  He is here for a routine visit.  Relatively stable with NYHA class III dyspnea, weight is down relative to March.  He does not report any exertional chest pain, no palpitations or syncope. He had a follow-up visit with Dr. Gala Romney in March, I reviewed the note.  [employer] CRT-D in place with follow-up by Dr. Ladona Ridgel.  No device shocks.  Device interrogation in May revealed normal function.  I reviewed his medications.  He is tolerating most recent addition of Ozempic, has lost at least 10 pounds so far.  The remainder of his cardiac regimen and diuretic dosing are stable.  I reviewed his recent lab work as well.  Physical Exam: VS:  BP 119/69   Pulse 71   Ht 5' 11.5" (1.816 m)   Wt 278 lb (126.1 kg)   SpO2 97%   BMI 38.23 kg/m , BMI Body mass index is 38.23 kg/m.  Wt Readings from Last 3 Encounters:  12/16/22 278 lb (126.1 kg)  10/27/22 279 lb (126.6 kg)  09/10/22 291 lb (132 kg)    General: Patient appears comfortable at rest. HEENT: Conjunctiva and lids normal. Neck: Supple, no elevated JVP or carotid bruits. Lungs: Clear to auscultation, nonlabored breathing at rest. Cardiac: Regular rate and rhythm, no S3, 1/6 systolic murmur. Extremities: Lymphedema with compression stockings in place below the knees.  ECG:  An ECG dated 09/10/2022 was personally reviewed today and demonstrated:  Ventricular paced rhythm.  Labwork: 09/18/2022: BUN 23; Creatinine, Ser 1.27; Potassium 4.5; Sodium 146  May 2024: Cholesterol 129, triglycerides 203, HDL 31, LDL 57, AST 60, ALT 78, BUN 21, creatinine 1.04, potassium 4.6, hemoglobin A1c 6.5%  Other Studies Reviewed Today:  Echocardiogram 03/18/2022:  1. Left ventricular ejection fraction, by estimation, is 40 to 45%. The  left ventricle has  mildly decreased function. The left ventricle  demonstrates global hypokinesis. There is mild left ventricular  hypertrophy. Left ventricular diastolic parameters  are indeterminate.   2. Right ventricular systolic function is moderately reduced. The right  ventricular size is normal.   3. Left atrial size was severely dilated.   4. Right atrial size was moderately dilated.   5. The mitral valve is normal in structure. Mild mitral valve  regurgitation.   6. The aortic valve was not well visualized. Aortic valve regurgitation  is not visualized.   Assessment and Plan:  1.  HFmrEF with mixed ischemic/nonischemic cardiomyopathy, LVEF 40 to 45% by echocardiogram in September 2023.  NYHA class III dyspnea which is multifactorial, no substantial fluid gain over baseline.  He is currently on Farxiga, Ozempic, Cozaar, Toprol-XL, Aldactone, Demadex, and potassium supplement.  Recent creatinine 1.04 and potassium normal.  Keep follow-up with Dr. Gala Romney in September, due for echocardiogram at that time as well.  2.  [employer] CRT-D in place with follow-up by Dr. Ladona Ridgel.  No device shocks or syncope.  3.  Permanent atrial fibrillation with history of AV node ablation.  CHA2DS2-VASc score is 7.  He continues on Eliquis for stroke prophylaxis, no spontaneous bleeding problems reported.  4.  Pulmonary hypertension, WHO group 2 and 3.  5.  Multivessel CAD status post CABG in 2001 with LIMA to distal LAD and the distal circumflex marginal as well as SVG to PDA.  Grafts were patent  at angiography in October 2022.  No active angina at this time.  Continues on low-dose aspirin along with Crestor.  LDL 57 in May.  6.  OSA on CPAP.  Disposition:  Follow up  6 months.  Signed, Jonelle Sidle, M.D., F.A.C.C. [facility] at [facility]

## 2022-12-16 NOTE — Patient Instructions (Addendum)
Medication Instructions:  Your physician recommends that you continue on your current medications as directed. Please refer to the Current Medication list given to you today.  Labwork: none  Testing/Procedures: Your physician has requested that you have an echocardiogram in September 2024. Echocardiography is a painless test that uses sound waves to create images of your heart. It provides your doctor with information about the size and shape of your heart and how well your heart's chambers and valves are working. This procedure takes approximately one hour. There are no restrictions for this procedure. Please do NOT wear cologne, perfume, aftershave, or lotions (deodorant is allowed). Please arrive 15 minutes prior to your appointment time.  Follow-Up: Your physician recommends that you schedule a follow-up appointment in: 6 months  Any Other Special Instructions Will Be Listed Below (If Applicable).  If you need a refill on your cardiac medications before your next appointment, please call your pharmacy.

## 2022-12-31 ENCOUNTER — Telehealth: Payer: Self-pay | Admitting: *Deleted

## 2022-12-31 NOTE — Telephone Encounter (Signed)
Patient called and states he is in the donut hole for Trelegy and would like to get a prescription mailed to his home using [facility]. He would like to see if he could get a 90 day supply   Next rov 01/11/23 with VS    Please call and advise [phone]

## 2023-01-01 ENCOUNTER — Telehealth: Payer: Self-pay | Admitting: Cardiology

## 2023-01-01 MED ORDER — TRELEGY ELLIPTA 100-62.5-25 MCG/ACT IN AEPB: 1.0000 | INHALATION_SPRAY | Freq: Every day | RESPIRATORY_TRACT | 4 refills | Status: DC

## 2023-01-01 NOTE — Telephone Encounter (Signed)
Detailed message left on voice mail that we have received the paperwork.  Will be given to Dr. Ival Bible nurse to address.

## 2023-01-01 NOTE — Telephone Encounter (Signed)
Wife is calling to see if our office received paperwork for the patient. From [employer]. Please advise

## 2023-01-01 NOTE — Telephone Encounter (Signed)
Called and spoke w/ pt he verbalized that he is currently in the donut hole for his meds. He is needing a paper prescription for a 90 day supply of Trelegy faxed to [facility]. Printed prescription waiting on VS to return to office to sign.

## 2023-01-01 NOTE — Telephone Encounter (Signed)
Pt calling back to get his Trelegy prescription sent in.

## 2023-01-05 ENCOUNTER — Other Ambulatory Visit: Payer: Self-pay | Admitting: *Deleted

## 2023-01-05 ENCOUNTER — Telehealth: Payer: Self-pay | Admitting: Pulmonary Disease

## 2023-01-05 MED ORDER — APIXABAN 5 MG PO TABS: 5.0000 mg | ORAL_TABLET | Freq: Two times a day (BID) | ORAL | 3 refills | Status: DC

## 2023-01-05 NOTE — Telephone Encounter (Signed)
Paperwork from [employer] has been placed in shred bin.

## 2023-01-05 NOTE — Telephone Encounter (Signed)
Patient checking on message for RX Trelegy. Patient phone number is [phone].

## 2023-01-05 NOTE — Telephone Encounter (Signed)
Wife called back to say DO NOT fill our the paperwork from [employer]. Wife states its a scam. Please advise

## 2023-01-06 NOTE — Telephone Encounter (Signed)
Called and spoke w/ pt let him know that Dr.Sood has not been in office but the prescription is waiting for him to sign @ his return. NFN att.

## 2023-01-08 ENCOUNTER — Telehealth: Payer: Self-pay | Admitting: Cardiology

## 2023-01-08 NOTE — Telephone Encounter (Signed)
Patients wife notified that paperwork for [employer] was faxed on 7-2 per faxed stamp on paperwork.  Pt's wife had no further questions or concerns at this time.

## 2023-01-08 NOTE — Telephone Encounter (Signed)
Patient's wife said that they had left some documents on Monday to get filled out. Said they called [employer] and they have not received any paperwork back yet and want to know how long will it be

## 2023-01-11 ENCOUNTER — Ambulatory Visit: Payer: Medicare Other | Admitting: Pulmonary Disease

## 2023-01-11 ENCOUNTER — Telehealth: Payer: Self-pay | Admitting: Cardiology

## 2023-01-11 ENCOUNTER — Encounter: Payer: Self-pay | Admitting: Pulmonary Disease

## 2023-01-11 VITALS — BP 117/68 | HR 71 | Ht 71.5 in | Wt 277.8 lb

## 2023-01-11 DIAGNOSIS — G4733 Obstructive sleep apnea (adult) (pediatric): Secondary | ICD-10-CM | POA: Diagnosis not present

## 2023-01-11 MED ORDER — FLUTICASONE PROPIONATE 50 MCG/ACT NA SUSP
1.0000 | Freq: Every day | NASAL | 2 refills | Status: DC
Start: 2023-01-11 — End: 2024-02-23

## 2023-01-11 NOTE — Telephone Encounter (Signed)
Spoke to pt's wife and verbalized that we would resend application to [employer]- pt's wife voiced understanding. Pt's wife had no further questions or concerns at this time.

## 2023-01-11 NOTE — Telephone Encounter (Signed)
*  STAT* If patient is at the pharmacy, call can be transferred to refill team.   1. Which medications need to be refilled? (please list name of each medication and dose if known) apixaban (ELIQUIS) 5 MG TABS tablet   2. Which pharmacy/location (including street and city if local pharmacy) is medication to be sent to? Pt states she uses [facility], however, I can not find it in the system. Please advise  3. Do they need a 30 day or 90 day supply? 90 day

## 2023-01-11 NOTE — Telephone Encounter (Signed)
Spoke with pt and wife.  Wife states she already got and call from [facility] CMA and she is resending all paperwork for patient assistance to [employer] and Eliquis [facility] since they did not receive it.

## 2023-01-11 NOTE — Telephone Encounter (Signed)
Pt states the paperwork that was faxed has not been received by the office it was supposed to be faxed too. Please advise.

## 2023-01-11 NOTE — Patient Instructions (Signed)
Try using flonase at night to help with sinus congestion and post nasal drip.  Use mucinex, inhaler, then flutter valve when you have more chest congestion and cough.  Try using benadryl 25 to 50 mg nightly to help with sleep.  If your sleep is okay after a few weeks, then you can try stopping melatonin.  Will arrange for a new CPAP machine.  Follow up in 6 months.

## 2023-01-11 NOTE — Progress Notes (Signed)
[facility]  Chief Complaint  Patient presents with   Follow-up    Past Surgical History:  He  has a past surgical history that includes Coronary artery bypass graft (2001); Cataract extraction (Bilateral); Tonsillectomy and adenoidectomy; Hernia repair (Right); ATRIAL FIBRILLATION ABLATION (2011); RIGHT/LEFT HEART CATH AND CORONARY/GRAFT ANGIOGRAPHY (N/A, 05/05/2021); ICD IMPLANT (N/A, 05/07/2021); AV NODE ABLATION (N/A, 05/07/2021); and AV NODE ABLATION (N/A, 09/01/2021).  Past Medical History:  Anxiety, A fib, CAD s/p CABG, Systolic CHF, CKD 3, COVID August 2021, Depression, HTN, Gout, HLD, CVA, DM type 2  Constitutional:  BP 117/68   Pulse 71   Ht 5' 11.5" (1.816 m)   Wt 277 lb 12.8 oz (126 kg)   SpO2 90% Comment: 3L POC  BMI 38.21 kg/m   Brief Summary:  Tommy Riley is a 77 y.o. male former smoker with COPD, obstructive sleep apnea and chronic respiratory failure.      Subjective:   He is here with his wife.  He gets winded easily.  Has more trouble in the shower.  Activity limited by neuropathy.  Gets sinus congestion and post nasal drip.  This can trigger a cough.  Has clear sputum.  Sometimes hard to bring up phlegm.  Not having wheeze, hemoptysis, or fever.  Uses CPAP nightly.  No issues with pressure or mask fit.  Uses oxygen 24/7.  Physical Exam:   Appearance - well kempt, wearing oxygen, uses a cane  ENMT - no sinus tenderness, no oral exudate, no LAN, Mallampati 3 airway, no stridor  Respiratory - equal breath sounds bilaterally, no wheezing or rales  CV - s1s2 regular rate and rhythm, no murmurs  Ext - no clubbing, no edema  Skin - no rashes  Psych - normal mood and affect    Pulmonary testing:  PFT 04/28/17 >> FEV1 1.65 (46%), FEV1% 55, TLC 5.93 (82%), DLCO 67%  Chest Imaging:  HRCT chest 04/24/20 >> scarring at bases  Sleep Tests:  PSG 10/17/21 >> AHI 8.5, SpO2 low 83% CPAP 12/12/22 to 01/10/23 >>  used on 30 of 30 nights with average 9 hrs 40 min.  Average AHI 1.2 with CPAP 14 cm H2O  Cardiac Tests:  Echo 03/18/22 >> EF 40 to 45%, severe LA dilation, mod RA dilation, mild MR, mod RV systolic dysfx  Social History:  He  reports that he quit smoking about 11 years ago. His smoking use included cigarettes. He has a 125.00 pack-year smoking history. He has never used smokeless tobacco. He reports that he does not drink alcohol and does not use drugs.  Family History:  His family history includes Breast cancer in his brother; COPD in his brother; Crohn's disease in his brother; Diabetes Mellitus II in his father and sister; Hypertension in his father and sister; Raynaud syndrome in his mother; Stroke in his brother and father.     Assessment/Plan:   COPD with chronic bronchitis. - continue trelegy 100 one puff daily - have can use mucinex, albuterol, flutter valve as needed for chest congestion and cough - if he continues to have trouble with expectoration, then he could be set up with a chest vest  Obstructive sleep apnea. - he is compliant with CPAP and reports benefit from therapy - he uses [employer] for his DME - his current CPAP is more than 75 years old - will arrange for a new CPAP at 14 cm H2O  Chronic respiratory failure with hypoxia. - uses 3 liters  oxygen 24/7 - he has an Inogen POC he purchased on his own - gets oxygen supplies also through [employer]  Sinus congestion with post nasal drip. - he can try using flonase at night  Insomnia. - discussed concerns about long term use of acetaminophen in sleep aide medications - will have him try benadryl 25 to 50 mg nightly; this should help with his sinus symptoms as well - he can try stopping melatonin at night in a few weeks if his sleep is stable after changing to benadryl  HFrEF, Permanent Atrial Fibrillation, CAD s/p CABG. - followed by Dr. Simona Huh with cardiology  CKD from cardiorenal syndrome. - followed by  [facility] in [place]  Time Spent Involved in Patient Care on Day of Examination:  48 minutes  Follow up:   Patient Instructions  Try using flonase at night to help with sinus congestion and post nasal drip.  Use mucinex, inhaler, then flutter valve when you have more chest congestion and cough.  Try using benadryl 25 to 50 mg nightly to help with sleep.  If your sleep is okay after a few weeks, then you can try stopping melatonin.  Will arrange for a new CPAP machine.  Follow up in 6 months.  Medication List:   Allergies as of 01/11/2023       Reactions   Codeine Anaphylaxis, Other (See Comments), Shortness Of Breath   Tightness in chest Other Reaction(s): chest pain Tightness in chest    Other reaction(s): Other (See Comments)  Tightness in chest  Tightness in chest  Tightness in chest        Medication List        Accurate as of January 11, 2023 12:45 PM. If you have any questions, ask your nurse or doctor.          Accu-Chek Aviva Plus test strip Generic drug: glucose blood   albuterol 108 (90 Base) MCG/ACT inhaler Commonly known as: VENTOLIN HFA USE 2 INHALATIONS BY MOUTH  EVERY 4 HOURS AS NEEDED FOR SHORTNESS OF BREATH OR  WHEEZE   albuterol (2.5 MG/3ML) 0.083% nebulizer solution Commonly known as: PROVENTIL Take 3 mLs (2.5 mg total) by nebulization every 6 (six) hours as needed for wheezing or shortness of breath.   allopurinol 100 MG tablet Commonly known as: ZYLOPRIM Taking two tablets by mouth in the morning and 1 tablet by mouth in the evening   apixaban 5 MG Tabs tablet Commonly known as: Eliquis Take 1 tablet (5 mg total) by mouth 2 (two) times daily.   aspirin EC 81 MG tablet Take 81 mg by mouth in the morning.   Centrum Cardio Tabs Take 1 tablet by mouth in the morning.   diphenhydramine-acetaminophen 25-500 MG Tabs tablet Commonly known as: TYLENOL PM Take 2 tablets by mouth at bedtime.   ezetimibe 10 MG  tablet Commonly known as: ZETIA Take 10 mg by mouth in the morning.   Farxiga 10 MG Tabs tablet Generic drug: dapagliflozin propanediol Take 1 tablet (10 mg total) by mouth daily.   fluticasone 50 MCG/ACT nasal spray Commonly known as: FLONASE Place 1 spray into both nostrils daily. Started by: Coralyn Helling, MD   folic acid 400 MCG tablet Commonly known as: FOLVITE Take 400 mcg by mouth every evening.   HumaLOG KwikPen 200 UNIT/ML KwikPen Generic drug: insulin lispro 20 Units as needed. On a sliding scale   insulin detemir 100 UNIT/ML injection Commonly known as: LEVEMIR Inject 50 Units into the  skin 2 (two) times daily. On a sliding scale   loratadine 10 MG tablet Commonly known as: CLARITIN Take 10 mg by mouth in the morning.   losartan 50 MG tablet Commonly known as: COZAAR Take 1 tablet (50 mg total) by mouth daily.   melatonin 5 MG Tabs Take 5 mg by mouth at bedtime.   metoprolol 200 MG 24 hr tablet Commonly known as: TOPROL-XL TAKE 1 TABLET BY MOUTH DAILY  WITH OR IMMEDIATELY FOLLOWING A  MEAL   omega-3 acid ethyl esters 1 g capsule Commonly known as: LOVAZA Take 2 g by mouth 2 (two) times daily.   ONE TOUCH ULTRA 2 w/Device Kit by Does not apply route.   oxymetazoline 0.05 % nasal spray Commonly known as: AFRIN Place 1 spray into both nostrils as needed for congestion.   Ozempic (0.25 or 0.5 MG/DOSE) 2 MG/3ML Sopn Generic drug: Semaglutide(0.25 or 0.5MG /DOS) Inject 0.5 Units into the skin once a week.   potassium chloride SA 20 MEQ tablet Commonly known as: KLOR-CON M Take 3 tablets (60 mEq total) by mouth 3 (three) times daily.   RELION PEN NEEDLE 31G/8MM 31G X 8 MM Misc Generic drug: Insulin Pen Needle 3 (three) times daily.   rosuvastatin 40 MG tablet Commonly known as: CRESTOR TAKE 1 TABLET BY MOUTH DAILY   sertraline 100 MG tablet Commonly known as: ZOLOFT Take 150 mg by mouth in the morning.   spironolactone 25 MG tablet Commonly  known as: ALDACTONE TAKE 1 TABLET BY MOUTH DAILY   torsemide 20 MG tablet Commonly known as: DEMADEX TAKE 4 TABLETS BY MOUTH TWICE  DAILY MAY TAKE AN ADDITIONAL 1  TABLET DAILY AS NEEDED FOR 2 TO  3 LBS WEIGHT GAIN IN 1 DAY OR 5  LBS IN 1 WEEK   Trelegy Ellipta 100-62.5-25 MCG/ACT Aepb Generic drug: Fluticasone-Umeclidin-Vilant Inhale 1 puff into the lungs daily.   Vitamin C 500 MG Caps Take 500 mg by mouth daily.   Vitamin D3 50 MCG (2000 UT) Tabs Take 1 capsule by mouth in the morning and at bedtime.   zinc sulfate 220 (50 Zn) MG capsule Take 220 mg by mouth every evening.        Signature:  Coralyn Helling, MD [facility]monary/Critical Care Pager - [phone] 01/11/2023, 12:45 PM

## 2023-01-11 NOTE — Telephone Encounter (Signed)
Patient assistance application resubmitted to [employer].

## 2023-01-12 ENCOUNTER — Encounter: Payer: Self-pay | Admitting: *Deleted

## 2023-01-12 NOTE — Progress Notes (Signed)
Fax notification received from [employer] that Eliquis 5 mg BID approved by [employer] through 07/06/2023

## 2023-01-18 ENCOUNTER — Telehealth: Payer: Self-pay | Admitting: Cardiology

## 2023-01-18 NOTE — Telephone Encounter (Signed)
Follow Up:      Wife is calling back to see if the paperwork have been mailed back to the  patient?  She said she asked that paperwork had been faxed, that it would be mailed back to her please.

## 2023-01-19 ENCOUNTER — Telehealth: Payer: Self-pay | Admitting: Cardiology

## 2023-01-19 ENCOUNTER — Telehealth: Payer: Self-pay | Admitting: Pulmonary Disease

## 2023-01-19 DIAGNOSIS — J9611 Chronic respiratory failure with hypoxia: Secondary | ICD-10-CM

## 2023-01-19 MED ORDER — FARXIGA 10 MG PO TABS: 10.0000 mg | ORAL_TABLET | Freq: Every day | ORAL | 90 refills | Status: DC

## 2023-01-19 NOTE — Telephone Encounter (Signed)
Sent in refill as requested 

## 2023-01-19 NOTE — Telephone Encounter (Signed)
*  STAT* If patient is at the pharmacy, call can be transferred to refill team.   1. Which medications need to be refilled? (please list name of each medication and dose if known)   FARXIGA 10 MG TABS tablet     2. Would you like to learn more about the convenience, safety, & potential cost savings by using the [facility]?no    3. Are you open to using the [facility] (Type [facility].no ).   4. Which pharmacy/location (including street and city if local pharmacy) is medication to be sent to?   Pt stated [employer] - I think that it Covermy Meds but not 100% sure.    5. Do they need a 30 day or 90 day supply? 90

## 2023-01-19 NOTE — Telephone Encounter (Signed)
Patient states [facility] did not receive the RX for Trelegy. Patient phone number is [phone].

## 2023-01-25 ENCOUNTER — Other Ambulatory Visit: Payer: Self-pay | Admitting: Cardiology

## 2023-01-25 NOTE — Telephone Encounter (Signed)
Prescription refill request for Tommy Riley received. Indication: AF Last office visit: 12/16/22  Ival Bible MD Scr: 1.04 on 11/06/22  Epic Age: 77 Weight: 126.1kg  Based on above findings Tommy Riley 5mg  twice daily is the appropriate dose.  Refill approved.

## 2023-01-26 ENCOUNTER — Ambulatory Visit (INDEPENDENT_AMBULATORY_CARE_PROVIDER_SITE_OTHER): Payer: Medicare Other | Admitting: Gastroenterology

## 2023-02-01 NOTE — Telephone Encounter (Signed)
Pt. Calling back and needing more refills sent to [facility] but is about to run out of inhaler

## 2023-02-03 MED ORDER — TRELEGY ELLIPTA 100-62.5-25 MCG/ACT IN AEPB
1.0000 | INHALATION_SPRAY | Freq: Every day | RESPIRATORY_TRACT | 4 refills | Status: DC
Start: 2023-02-03 — End: 2023-08-06

## 2023-02-03 MED ORDER — TRELEGY ELLIPTA 100-62.5-25 MCG/ACT IN AEPB
1.0000 | INHALATION_SPRAY | Freq: Every day | RESPIRATORY_TRACT | 4 refills | Status: DC
Start: 2023-02-03 — End: 2023-02-03

## 2023-02-03 NOTE — Telephone Encounter (Signed)
Spoke with patient's wife Damian Leavell regarding prior message . Gerarda Gunther I will print out a new script for 90 days and will fax it over to [facility]. Faxed script to [facility] [phone].  [name]'s voice was understanding. Nothing else further needed.

## 2023-02-04 ENCOUNTER — Telehealth: Payer: Self-pay | Admitting: Pulmonary Disease

## 2023-02-04 ENCOUNTER — Ambulatory Visit: Payer: Medicare Other

## 2023-02-04 DIAGNOSIS — I255 Ischemic cardiomyopathy: Secondary | ICD-10-CM

## 2023-02-04 LAB — CUP PACEART REMOTE DEVICE CHECK
Battery Remaining Longevity: 132 mo
Battery Remaining Percentage: 100 %
Brady Statistic RA Percent Paced: 99 %
Brady Statistic RV Percent Paced: 0 %
Date Time Interrogation Session: 20240801013200
HighPow Impedance: 74 Ohm
Implantable Lead Connection Status: 753985
Implantable Lead Connection Status: 753985
Implantable Lead Connection Status: 753985
Implantable Lead Implant Date: 20221103
Implantable Lead Implant Date: 20221103
Implantable Lead Implant Date: 20221103
Implantable Lead Location: 753858
Implantable Lead Location: 753860
Implantable Lead Location: 753860
Implantable Lead Model: 138
Implantable Lead Model: 3830
Implantable Lead Model: 4671
Implantable Lead Serial Number: 304581
Implantable Lead Serial Number: 852093
Implantable Pulse Generator Implant Date: 20221103
Lead Channel Impedance Value: 544 Ohm
Lead Channel Impedance Value: 554 Ohm
Lead Channel Impedance Value: 603 Ohm
Lead Channel Setting Pacing Amplitude: 2.5 V
Lead Channel Setting Sensing Sensitivity: 0.5 mV
Lead Channel Setting Sensing Sensitivity: 1 mV
Pulse Gen Serial Number: 390312

## 2023-02-05 NOTE — Telephone Encounter (Signed)
Sleep study from 10/17/22 routed via Epic to [facility].

## 2023-02-17 NOTE — Progress Notes (Signed)
Remote ICD transmission.   

## 2023-02-19 ENCOUNTER — Other Ambulatory Visit: Payer: Self-pay

## 2023-02-19 MED ORDER — ALBUTEROL SULFATE HFA 108 (90 BASE) MCG/ACT IN AERS: INHALATION_SPRAY | RESPIRATORY_TRACT | 1 refills | Status: DC

## 2023-02-26 ENCOUNTER — Telehealth: Payer: Self-pay | Admitting: Pulmonary Disease

## 2023-02-26 NOTE — Telephone Encounter (Signed)
Pt called in bc he was sent in albuterol ventolin and he needs albuterol sulfate  [facility] delivery

## 2023-03-18 ENCOUNTER — Telehealth: Payer: Self-pay | Admitting: Cardiology

## 2023-03-18 ENCOUNTER — Ambulatory Visit (INDEPENDENT_AMBULATORY_CARE_PROVIDER_SITE_OTHER): Payer: Medicare Other | Admitting: Gastroenterology

## 2023-03-18 MED ORDER — FARXIGA 10 MG PO TABS: 10.0000 mg | ORAL_TABLET | Freq: Every day | ORAL | 1 refills | Status: DC

## 2023-03-18 NOTE — Telephone Encounter (Signed)
Done

## 2023-03-19 ENCOUNTER — Other Ambulatory Visit: Payer: Self-pay | Admitting: *Deleted

## 2023-03-19 MED ORDER — DAPAGLIFLOZIN PROPANEDIOL 10 MG PO TABS: 10.0000 mg | ORAL_TABLET | Freq: Every day | ORAL | 3 refills | Status: DC

## 2023-03-30 ENCOUNTER — Other Ambulatory Visit: Payer: Self-pay | Admitting: Cardiology

## 2023-04-05 ENCOUNTER — Encounter (HOSPITAL_COMMUNITY): Payer: Self-pay | Admitting: Internal Medicine

## 2023-04-05 ENCOUNTER — Ambulatory Visit (HOSPITAL_BASED_OUTPATIENT_CLINIC_OR_DEPARTMENT_OTHER)
Admission: RE | Admit: 2023-04-05 | Discharge: 2023-04-05 | Disposition: A | Discharge status: Home / Self Care | Payer: Medicare Other | Source: Ambulatory Visit | Attending: Internal Medicine | Admitting: Internal Medicine

## 2023-04-05 ENCOUNTER — Ambulatory Visit (HOSPITAL_COMMUNITY)
Admission: RE | Admit: 2023-04-05 | Discharge: 2023-04-05 | Disposition: A | Discharge status: Home / Self Care | Payer: Medicare Other | Source: Ambulatory Visit | Attending: Internal Medicine | Admitting: Internal Medicine

## 2023-04-05 VITALS — BP 142/98 | HR 69 | Wt 269.0 lb

## 2023-04-05 DIAGNOSIS — N189 Chronic kidney disease, unspecified: Secondary | ICD-10-CM | POA: Insufficient documentation

## 2023-04-05 DIAGNOSIS — J449 Chronic obstructive pulmonary disease, unspecified: Secondary | ICD-10-CM | POA: Diagnosis not present

## 2023-04-05 DIAGNOSIS — I5022 Chronic systolic (congestive) heart failure: Secondary | ICD-10-CM | POA: Diagnosis not present

## 2023-04-05 DIAGNOSIS — I2510 Atherosclerotic heart disease of native coronary artery without angina pectoris: Secondary | ICD-10-CM | POA: Insufficient documentation

## 2023-04-05 DIAGNOSIS — I4821 Permanent atrial fibrillation: Secondary | ICD-10-CM

## 2023-04-05 DIAGNOSIS — G4733 Obstructive sleep apnea (adult) (pediatric): Secondary | ICD-10-CM

## 2023-04-05 DIAGNOSIS — E1122 Type 2 diabetes mellitus with diabetic chronic kidney disease: Secondary | ICD-10-CM | POA: Insufficient documentation

## 2023-04-05 DIAGNOSIS — Z8673 Personal history of transient ischemic attack (TIA), and cerebral infarction without residual deficits: Secondary | ICD-10-CM | POA: Diagnosis not present

## 2023-04-05 DIAGNOSIS — I130 Hypertensive heart and chronic kidney disease with heart failure and stage 1 through stage 4 chronic kidney disease, or unspecified chronic kidney disease: Secondary | ICD-10-CM | POA: Insufficient documentation

## 2023-04-05 DIAGNOSIS — Z950 Presence of cardiac pacemaker: Secondary | ICD-10-CM | POA: Insufficient documentation

## 2023-04-05 DIAGNOSIS — I272 Pulmonary hypertension, unspecified: Secondary | ICD-10-CM

## 2023-04-05 DIAGNOSIS — E785 Hyperlipidemia, unspecified: Secondary | ICD-10-CM | POA: Diagnosis not present

## 2023-04-05 DIAGNOSIS — I080 Rheumatic disorders of both mitral and aortic valves: Secondary | ICD-10-CM | POA: Insufficient documentation

## 2023-04-05 LAB — ECHOCARDIOGRAM COMPLETE
Area-P 1/2: 4.49 cm2
Calc EF: 43.6 %
MV M vel: 4.84 m/s
MV Peak grad: 93.7 mm[Hg]
MV VTI: 2.29 cm2
S' Lateral: 3.8 cm
Single Plane A2C EF: 49 %
Single Plane A4C EF: 42.8 %

## 2023-04-05 NOTE — Progress Notes (Signed)
  Echocardiogram 2D Echocardiogram has been performed.  Milda Smart 04/05/2023, 1:40 PM

## 2023-04-05 NOTE — Progress Notes (Signed)
ADVANCED HF CLINIC NOTE   Primary Care: Pomposini, Rande Brunt, MD HF Cardiologist: Dr. Letitia Libra cardiologist: Diona Browner  HPI: Tommy Riley is a 77 y.o. male with history of chronic systolic HF, ischemic cardiomyopathy, CAD s/p CABG in 2001, permanent atrial fibrillation s/p atrial fibrillation and atrial flutter ablation in 2001, OSA on CPAP, DM II.    Previously seen by Dr. Monia Pouch at [facility] for pulmonary hypertension in 2018. Felt to be multifactorial and likely combination of WHO groups 2 and 3.    Admitted 10/22 with a/c HF. Echo with LVEF 35-40%, RV mildly reduced, RVSP 66 mmHg, trivial MR.Diuresed with IV lasix, diamox, and metolazone.  Marland Kitchen Has LBBB with wide QRS. Seen by EP and unclear how much he would benefit from CRT. Underwent L/RHC  with patent grafts (Y graft from LIMA to distal LAD and distal circumflex marginal branch, vein graft to PDA) and showed pulmonary hypertension, CO preserved. Pulmonary HTN felt be d/t combination of WHO Group II and III. cMRI 11/02 with evidence of mixed ischemic and nonischemic cardiomyopathy. LVEF 34%. Underwent CRT-D on 11/02, did not get AV nodal ablation. IV lasix switched to Torsemide. Counseled extensively on cutting back fluid intake. Continued on GDMT with digoxin, spironolactone, entresto, farxiga and metoprolol succinate.  Discharge weight 280 lbs.  Admitted in 2/23 with recurrent HF in setting of AF. Underwent diuresis and AV node ablation by Dr. Ladona Ridgel.  Echo 10/22 EF 35-40% RV mildly reduced   Saw Dr. Ladona Ridgel 4/23 and device reprogrammed rate from 80 down to 70.   Echo 03/18/22 EF 40-45%  Today he returns for HF follow up with his wife. Says his main limitation is his neuropathy in his legs. But also gets SOB with minimal exertion. LE edema well controlled with compression hose. No orthopnea or PND. No problem with medications. Has lost about 20 pounds with Ozempic.   Past Medical History:  Diagnosis Date   Anxiety    Atrial  fibrillation and flutter (HCC)    Atrial fibrillation ablation 2011 at [facility]   CAD in native artery    Chronic systolic heart failure (HCC)    CKD (chronic kidney disease), stage III (HCC)    COPD (chronic obstructive pulmonary disease) (HCC)    COVID-22 February 2020   Depression    Essential hypertension    Gout    Hyperlipidemia    Ischemic heart disease    OSA (obstructive sleep apnea)    Stroke (HCC) 2003   Type 2 diabetes mellitus (HCC)    Current Outpatient Medications  Medication Sig Dispense Refill   ACCU-CHEK AVIVA PLUS test strip      albuterol (PROVENTIL) (2.5 MG/3ML) 0.083% nebulizer solution Take 3 mLs (2.5 mg total) by nebulization every 6 (six) hours as needed for wheezing or shortness of breath. 75 mL 4   albuterol (VENTOLIN HFA) 108 (90 Base) MCG/ACT inhaler USE 2 INHALATIONS BY MOUTH  EVERY 4 HOURS AS NEEDED FOR SHORTNESS OF BREATH OR  WHEEZE 54 g 1   allopurinol (ZYLOPRIM) 100 MG tablet Taking two tablets by mouth in the morning and 1 tablet by mouth in the evening     apixaban (ELIQUIS) 5 MG TABS tablet Take 1 tablet by mouth twice daily 180 tablet 1   Ascorbic Acid (VITAMIN C) 500 MG CAPS Take 500 mg by mouth daily.     aspirin 81 MG EC tablet Take 81 mg by mouth in the morning.     Blood Glucose Monitoring  Suppl (ONE TOUCH ULTRA 2) W/DEVICE KIT by Does not apply route.     Cholecalciferol (VITAMIN D3) 2000 UNITS TABS Take 1 capsule by mouth in the morning and at bedtime.     dapagliflozin propanediol (FARXIGA) 10 MG TABS tablet Take 1 tablet (10 mg total) by mouth daily. 90 tablet 3   diphenhydramine-acetaminophen (TYLENOL PM) 25-500 MG TABS tablet Take 2 tablets by mouth at bedtime.     ezetimibe (ZETIA) 10 MG tablet Take 10 mg by mouth in the morning.     fluticasone (FLONASE) 50 MCG/ACT nasal spray Place 1 spray into both nostrils daily. 16 g 2   Fluticasone-Umeclidin-Vilant (TRELEGY ELLIPTA) 100-62.5-25 MCG/ACT AEPB Inhale 1 puff into the lungs daily. 90  each 4   folic acid (FOLVITE) 400 MCG tablet Take 400 mcg by mouth every evening.     HUMALOG KWIKPEN 200 UNIT/ML KwikPen 20 Units as needed. On a sliding scale     loratadine (CLARITIN) 10 MG tablet Take 10 mg by mouth in the morning.     losartan (COZAAR) 50 MG tablet Take 1 tablet (50 mg total) by mouth daily. 90 tablet 3   melatonin 5 MG TABS Take 5 mg by mouth at bedtime.     metoprolol (TOPROL-XL) 200 MG 24 hr tablet TAKE 1 TABLET BY MOUTH DAILY  WITH OR IMMEDIATELY FOLLOWING A  MEAL 100 tablet 2   Multiple Vitamins-Minerals (CENTRUM CARDIO) TABS Take 1 tablet by mouth in the morning.     omega-3 acid ethyl esters (LOVAZA) 1 g capsule Take 2 g by mouth 2 (two) times daily.     oxymetazoline (AFRIN) 0.05 % nasal spray Place 1 spray into both nostrils as needed for congestion.     OZEMPIC, 0.25 OR 0.5 MG/DOSE, 2 MG/3ML SOPN Inject 0.5 Units into the skin once a week.     potassium chloride SA (KLOR-CON M) 20 MEQ tablet TAKE 3 TABLETS BY MOUTH 3 TIMES  DAILY 270 tablet 2   RELION PEN NEEDLE 31G/8MM 31G X 8 MM MISC 3 (three) times daily.     rosuvastatin (CRESTOR) 40 MG tablet TAKE 1 TABLET BY MOUTH DAILY 100 tablet 2   sertraline (ZOLOFT) 100 MG tablet Take 150 mg by mouth in the morning.     spironolactone (ALDACTONE) 25 MG tablet TAKE 1 TABLET BY MOUTH DAILY 100 tablet 2   torsemide (DEMADEX) 20 MG tablet TAKE 4 TABLETS BY MOUTH TWICE  DAILY MAY TAKE AN ADDITIONAL 1  TABLET BY MOUTH DAILY AS NEEDED  FOR 2 TO 3 LBS WEIGHT GAIN IN 1  DAY OR 5 LBS IN 1 WEEK 720 tablet 2   zinc sulfate 220 (50 Zn) MG capsule Take 220 mg by mouth every evening.     No current facility-administered medications for this encounter.   Allergies  Allergen Reactions   Codeine Anaphylaxis, Other (See Comments) and Shortness Of Breath    Tightness in chest  Other Reaction(s): chest pain  Tightness in chest    Other reaction(s): Other (See Comments)  Tightness in chest  Tightness in chest  Tightness in chest    Social History   Socioeconomic History   Marital status: Married    Spouse name: Alee Katen   Number of children: 3   Years of education: 14   Highest education level: Some college, no degree  Occupational History    Comment: retired  Tobacco Use   Smoking status: Former    Current packs/day: 0.00  Average packs/day: 2.5 packs/day for 50.0 years (125.0 ttl pk-yrs)    Types: Cigarettes    Start date: 06/16/1961    Quit date: 06/17/2011    Years since quitting: 11.8   Smokeless tobacco: Never  Vaping Use   Vaping status: Former   Start date: 07/06/2017   Quit date: 04/19/2021   Substances: Flavoring  Substance and Sexual Activity   Alcohol use: No    Comment: 1 beer a month   Drug use: No   Sexual activity: Not on file  Other Topics Concern   Not on file  Social History Narrative   Consumes no caffeine   Social Determinants of Health   Financial Resource Strain: Low Risk  (05/01/2021)   Overall Financial Resource Strain (CARDIA)    Difficulty of Paying Living Expenses: Not hard at all  Food Insecurity: No Food Insecurity (05/01/2021)   Hunger Vital Sign    Worried About Running Out of Food in the Last Year: Never true    Ran Out of Food in the Last Year: Never true  Transportation Needs: No Transportation Needs (05/01/2021)   PRAPARE - Administrator, Civil Service (Medical): No    Lack of Transportation (Non-Medical): No  Physical Activity: Not on file  Stress: Not on file  Social Connections: Not on file  Intimate Partner Violence: Not on file   Family History  Problem Relation Age of Onset   Raynaud syndrome Mother    Stroke Father    Diabetes Mellitus II Father    Hypertension Father    Hypertension Sister    Diabetes Mellitus II Sister    Stroke Brother    Crohn's disease Brother    Breast cancer Brother    COPD Brother    BP (!) 142/98   Pulse 69   Wt 122 kg (269 lb)   SpO2 99%   BMI 36.99 kg/m   Wt Readings from Last 3  Encounters:  04/05/23 122 kg (269 lb)  01/11/23 126 kg (277 lb 12.8 oz)  12/16/22 126.1 kg (278 lb)   PHYSICAL EXAM: General:  Obese male sitting in WC on O2 HEENT: normal Neck: supple. no JVD. Carotids 2+ bilat; no bruits. No lymphadenopathy or thryomegaly appreciated. Cor: PMI nondisplaced. Regular rate & rhythm. No rubs, gallops or murmurs. Lungs: clear Abdomen: + obese soft, nontender, nondistended. No hepatosplenomegaly. No bruits or masses. Good bowel sounds. Extremities: no cyanosis, clubbing, rash, edema + compression stockings  Neuro: alert & orientedx3, cranial nerves grossly intact. moves all 4 extremities w/o difficulty. Affect pleasant  Device interrogation: HL score 4, thoracic impedence ok no AF/VT, activity level 0.4 hr/day (Personally reviewed)  ASSESSMENT & PLAN:   1.Chronic systolic HF: - EF 35-40% on echo, RV mildly reduced - Echo 03/18/22 EF 40-45% - Etiology of LV dysfunction unclear but cMRI suggest mixed ischemic/nonischemic CM - s/p [employer] CRT-D. Device interrogated personally.  - Stable NYHA IIII -  mostly limited by lung disease and obesity.Volume status ok  - Continue torsemide 60 mg bid + 40 KCL bid. (He cut back from 80 bid a few days ago to test. I told him to start with 80/60) - Have switched Entresto 24/26 bid to losartan 50 daily due to cost (wants financial room to afford Ozempic).  - Continue spironolactone 25 mg daily.  - Continue Toprol 200 mg daily. - Continue Farxiga 10 mg daily.  - No change in GDMT today  - Watch fluid intake - ICD interrogated today as  above  2. CAD s/p CABG - Grafts patent on cath 05/05/21. - No s/s angina  - Continue ASA + beta blocker + statin.   3. Pulmonary HTN - RHC 05/05/21:  RA 14 RV 68/7 PA 68/unrecorded (40) PCWP unobtained LVEDP 21 Fick  4.9/2.0  PVR (using LVEDP) = 3.9 WU - Mixed PH WHO Group II & III. - Continue torsemide and CPAP.  - Continue weight loss efforts   4. Chronic AF with LBBB and  intermittent rate control  - Has been in AF > 10 years despite previous AF/AFL ablations - Now s/p AVN ablation and CRT-D implant 11/22. - Now off digoxin. - CHA2DS2-VASc score is 7. - Continue Eliquis. No bleeding    5. OSA - On CPAP. -Continue CPAP   6. Morbid obesity - Body mass index is 36.99 kg/m. - Continue Ozempic. Has lost 15-20 pounds   7. Neuropathy - Followed by his Neurologist, Dr. Frances Furbish. - Somewhat improved  8. DM2 - Per PCP - Continue Renato Battles, MD  2:24 PM

## 2023-04-05 NOTE — Patient Instructions (Signed)
Great to see you today!!!  No changes, continue current medications  Do the following things EVERYDAY: Weigh yourself in the morning before breakfast. Write it down and keep it in a log. Take your medicines as prescribed Eat low salt foods--Limit salt (sodium) to 2000 mg per day.  Stay as active as you can everyday Limit all fluids for the day to less than 2 liters  Your physician recommends that you schedule a follow-up appointment in: 4 months  If you have any questions or concerns before your next appointment please send [place] a message through [place] or call our office at [phone].    TO LEAVE A MESSAGE FOR THE NURSE SELECT OPTION 2, PLEASE LEAVE A MESSAGE INCLUDING: YOUR NAME DATE OF BIRTH CALL BACK NUMBER REASON FOR CALL**this is important as we prioritize the call backs  YOU WILL RECEIVE A CALL BACK THE SAME DAY AS LONG AS YOU CALL BEFORE 4:00 PM  At the [facility], you and your health needs are our priority. As part of our continuing mission to provide you with exceptional heart care, we have created designated Provider Care Teams. These Care Teams include your primary Cardiologist (physician) and Advanced Practice Providers (APPs- Physician Assistants and Nurse Practitioners) who all work together to provide you with the care you need, when you need it.   You may see any of the following providers on your designated Care Team at your next follow up: Dr Arvilla Meres Dr Marca Ancona Dr. Marcos Eke, NP Robbie Lis, [place] [facility] [place], [place] Brynda Peon, NP Karle Plumber, PharmD   Please be sure to bring in all your medications bottles to every appointment.    Thank you for choosing [facility]

## 2023-04-19 ENCOUNTER — Encounter: Payer: Self-pay | Admitting: Internal Medicine

## 2023-05-06 ENCOUNTER — Ambulatory Visit (INDEPENDENT_AMBULATORY_CARE_PROVIDER_SITE_OTHER): Payer: Medicare Other

## 2023-05-06 DIAGNOSIS — I255 Ischemic cardiomyopathy: Secondary | ICD-10-CM | POA: Diagnosis not present

## 2023-05-06 LAB — CUP PACEART REMOTE DEVICE CHECK
Battery Remaining Longevity: 126 mo
Battery Remaining Percentage: 100 %
Brady Statistic RA Percent Paced: 99 %
Brady Statistic RV Percent Paced: 0 %
Date Time Interrogation Session: 20241031020900
HighPow Impedance: 69 Ohm
Implantable Lead Connection Status: 753985
Implantable Lead Connection Status: 753985
Implantable Lead Connection Status: 753985
Implantable Lead Implant Date: 20221103
Implantable Lead Implant Date: 20221103
Implantable Lead Implant Date: 20221103
Implantable Lead Location: 753858
Implantable Lead Location: 753860
Implantable Lead Location: 753860
Implantable Lead Model: 138
Implantable Lead Model: 3830
Implantable Lead Model: 4671
Implantable Lead Serial Number: 304581
Implantable Lead Serial Number: 852093
Implantable Pulse Generator Implant Date: 20221103
Lead Channel Impedance Value: 539 Ohm
Lead Channel Impedance Value: 562 Ohm
Lead Channel Impedance Value: 657 Ohm
Lead Channel Setting Pacing Amplitude: 2.5 V
Lead Channel Setting Sensing Sensitivity: 0.5 mV
Lead Channel Setting Sensing Sensitivity: 1 mV
Pulse Gen Serial Number: 390312

## 2023-05-07 IMAGING — DX DG CHEST 1V PORT
1 series · 2 of 2 positions shown · non-contrast
Comparison: Portable exam 1766 hours compared to 04/09/2021

CLINICAL DATA: Shortness of breath for 1 week that is worsening,
cough, and fever starting 12 hours ago, history CHF, COPD, former
smoker

EXAM:
PORTABLE CHEST 1 VIEW

[Series 1: chest ap · 0.14mm/px · 2 of 2 slices shown]
[im 1/2]
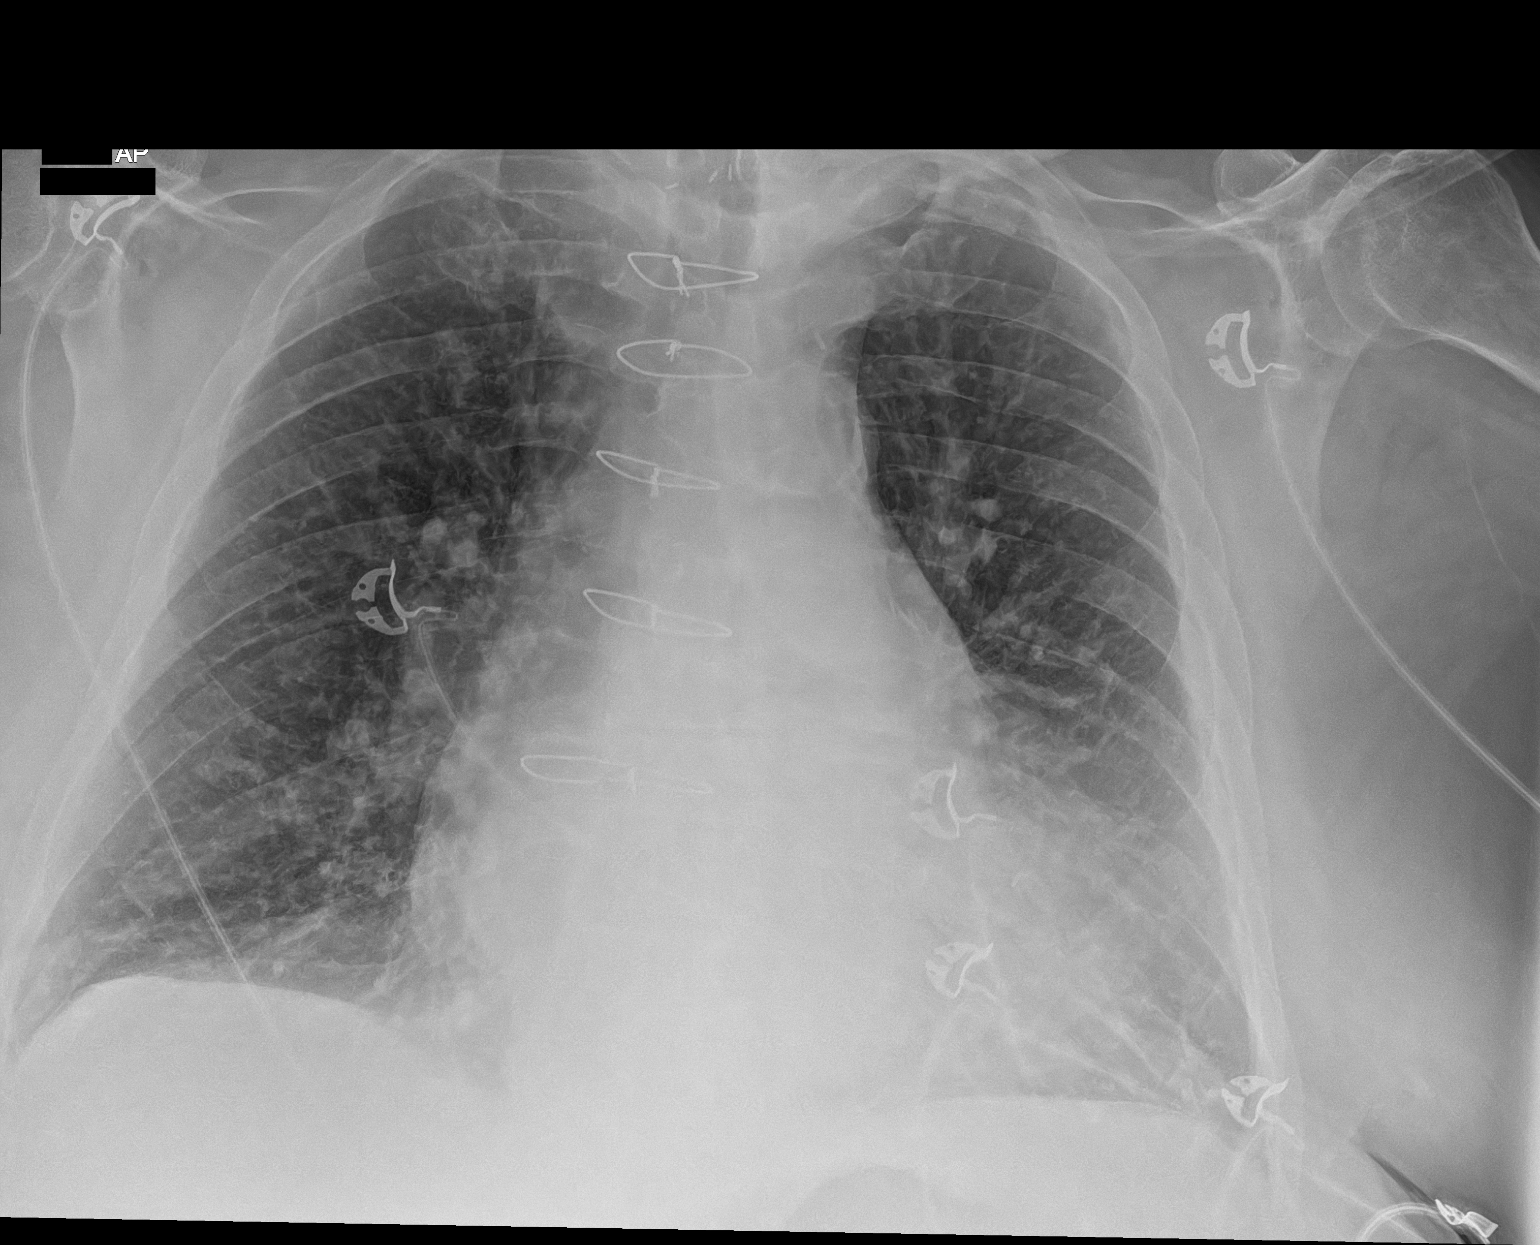
[im 2/2]
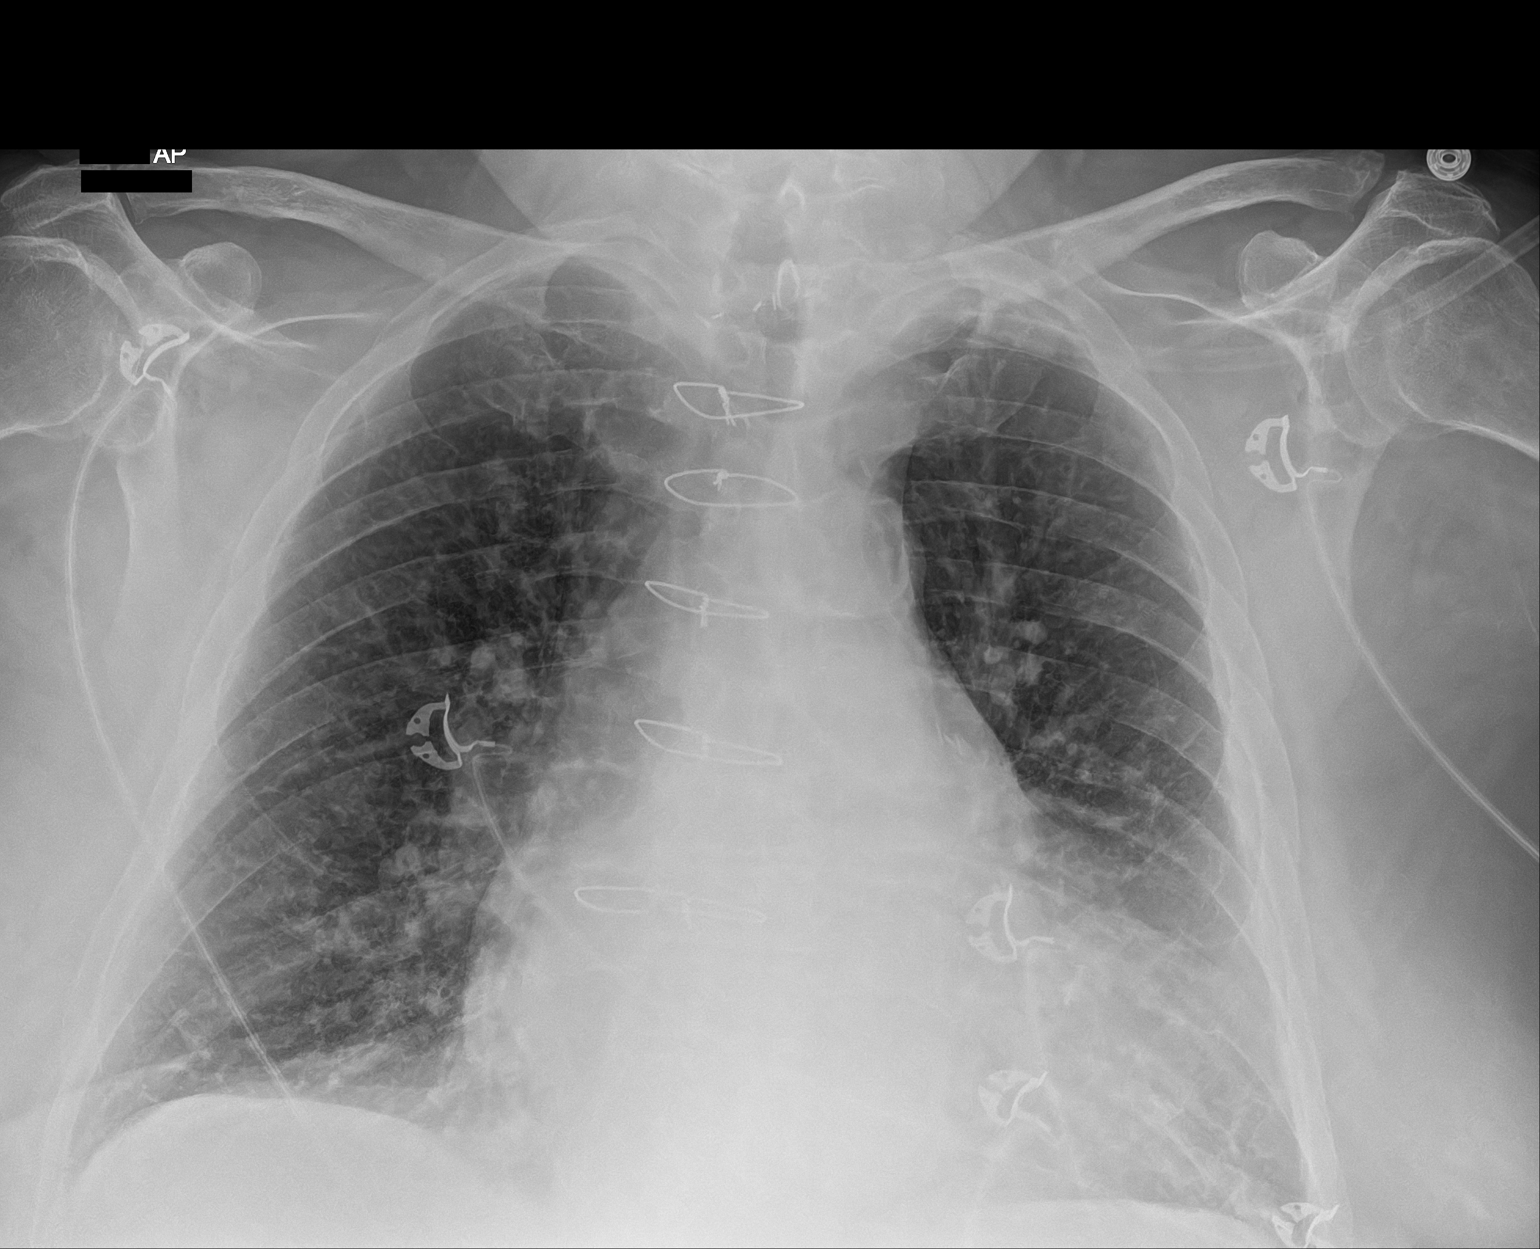

[2 of 2 positions shown; findings below may reference images not displayed]

FINDINGS: Enlargement of cardiac silhouette post median sternotomy.

Mediastinal contours and pulmonary vascularity normal.

Atherosclerotic calcification aorta.

Slight chronic accentuation of interstitial markings, stable.

No acute infiltrate, pleural effusion, or pneumothorax.

Osseous structures unremarkable.
IMPRESSION: Enlargement of cardiac silhouette.

No definite acute infiltrate.

## 2023-05-10 ENCOUNTER — Encounter: Payer: Self-pay | Admitting: *Deleted

## 2023-05-10 NOTE — Progress Notes (Signed)
Fax notification received from [employer] that farxiga 10 mg approved 07/07/2023 through 07/05/2024

## 2023-05-19 NOTE — Progress Notes (Signed)
Remote ICD transmission.   

## 2023-06-01 ENCOUNTER — Other Ambulatory Visit: Payer: Self-pay | Admitting: Cardiology

## 2023-06-02 ENCOUNTER — Encounter: Payer: Self-pay | Admitting: Internal Medicine

## 2023-06-14 ENCOUNTER — Encounter (INDEPENDENT_AMBULATORY_CARE_PROVIDER_SITE_OTHER): Payer: Self-pay | Admitting: Gastroenterology

## 2023-06-14 ENCOUNTER — Ambulatory Visit (INDEPENDENT_AMBULATORY_CARE_PROVIDER_SITE_OTHER): Payer: Medicare Other | Admitting: Gastroenterology

## 2023-06-14 ENCOUNTER — Encounter: Payer: Self-pay | Admitting: *Deleted

## 2023-06-14 ENCOUNTER — Telehealth: Payer: Self-pay | Admitting: *Deleted

## 2023-06-14 VITALS — BP 131/75 | HR 70 | Temp 98.6°F | Ht 71.5 in | Wt 271.3 lb

## 2023-06-14 DIAGNOSIS — D696 Thrombocytopenia, unspecified: Secondary | ICD-10-CM

## 2023-06-14 DIAGNOSIS — R7401 Elevation of levels of liver transaminase levels: Secondary | ICD-10-CM | POA: Diagnosis not present

## 2023-06-14 DIAGNOSIS — R161 Splenomegaly, not elsewhere classified: Secondary | ICD-10-CM

## 2023-06-14 DIAGNOSIS — K760 Fatty (change of) liver, not elsewhere classified: Secondary | ICD-10-CM

## 2023-06-14 DIAGNOSIS — R748 Abnormal levels of other serum enzymes: Secondary | ICD-10-CM | POA: Insufficient documentation

## 2023-06-14 DIAGNOSIS — Z6837 Body mass index (BMI) 37.0-37.9, adult: Secondary | ICD-10-CM | POA: Insufficient documentation

## 2023-06-14 DIAGNOSIS — K7460 Unspecified cirrhosis of liver: Secondary | ICD-10-CM | POA: Insufficient documentation

## 2023-06-14 MED ORDER — PANTOPRAZOLE SODIUM 40 MG PO TBEC: 40.0000 mg | DELAYED_RELEASE_TABLET | Freq: Every day | ORAL | 5 refills | Status: DC

## 2023-06-14 NOTE — Telephone Encounter (Signed)
[facility]  [place] scheduled for Friday 06/18/23, arrive at 8 am to check in at [facility], nothing to eat or drink after midnight. Bring insurance card and ID.  Message also sent via MyChart

## 2023-06-14 NOTE — Progress Notes (Signed)
Tommy Riley , M.D. Gastroenterology & Hepatology [facility] [address] [place], [place] [address] Primary Care Physician: Pomposini, Rande Brunt, MD No address on file  Chief Complaint:  Elevated liver enzymes , MASLD and imaging concerning for cirrhosis   History of Present Illness: Tommy Riley is a 77 y.o. male with significant co-morbidities including CKD, emphysema who continues on oxygen, CHF, diabetes, Afib, hypertension who presents for evaluation of Elevated liver enzymes , MASLD and imaging concerning for cirrhosis .  Patient is accompanying with wife in clinic today.  Reports never been told of any liver disease prior.  Occasional alcohol use beer once a month.  Denies any herbal medications or new medications.  Patient has been using Ozempic and has lost 20 pounds since.  Reports he has good appetite denies any dysphagia. The patient denies having any nausea, vomiting, fever, chills, hematochezia, melena, hematemesis, abdominal distention, abdominal pain, diarrhea, jaundice, pruritus  Last AOZ:HYQM Last Colonoscopy:many years ago complicated with hypoxia   FHx: neg for any gastrointestinal/liver disease, no malignancies Social: neg smoking, alcohol or illicit drug use Surgical: no abdominal surgeries  Past Medical History: Past Medical History:  Diagnosis Date   Anxiety    Atrial fibrillation and flutter (HCC)    Atrial fibrillation ablation 2011 at [facility]   CAD in native artery    Chronic systolic heart failure (HCC)    CKD (chronic kidney disease), stage III (HCC)    COPD (chronic obstructive pulmonary disease) (HCC)    COVID-22 February 2020   Depression    Essential hypertension    Gout    Hyperlipidemia    Ischemic heart disease    OSA (obstructive sleep apnea)    Stroke (HCC) 2003   Type 2 diabetes mellitus (HCC)     Past Surgical History: Past Surgical History:  Procedure Laterality Date    ATRIAL FIBRILLATION ABLATION  2011   [facility]   AV NODE ABLATION N/A 05/07/2021   Procedure: AV NODE ABLATION;  Surgeon: Marinus Maw, MD;  Location: [facility] INVASIVE CV LAB;  Service: Cardiovascular;  Laterality: N/A;   AV NODE ABLATION N/A 09/01/2021   Procedure: AV NODE ABLATION;  Surgeon: Marinus Maw, MD;  Location: [facility] INVASIVE CV LAB;  Service: Cardiovascular;  Laterality: N/A;   CATARACT EXTRACTION Bilateral    CORONARY ARTERY BYPASS GRAFT  2001   HERNIA REPAIR Right    ICD IMPLANT N/A 05/07/2021   Procedure: ICD IMPLANT;  Surgeon: Marinus Maw, MD;  Location: [facility] INVASIVE CV LAB;  Service: Cardiovascular;  Laterality: N/A;   RIGHT/LEFT HEART CATH AND CORONARY/GRAFT ANGIOGRAPHY N/A 05/05/2021   Procedure: RIGHT/LEFT HEART CATH AND CORONARY/GRAFT ANGIOGRAPHY;  Surgeon: Runell Gess, MD;  Location: [facility] INVASIVE CV LAB;  Service: Cardiovascular;  Laterality: N/A;   TONSILLECTOMY AND ADENOIDECTOMY      Family History: Family History  Problem Relation Age of Onset   Raynaud syndrome Mother    Stroke Father    Diabetes Mellitus II Father    Hypertension Father    Hypertension Sister    Diabetes Mellitus II Sister    Stroke Brother    Crohn's disease Brother    Breast cancer Brother    COPD Brother     Social History: Social History   Tobacco Use  Smoking Status Former   Current packs/day: 0.00   Average packs/day: 2.5 packs/day for 50.0 years (125.0 ttl pk-yrs)   Types: Cigarettes   Start  date: 06/16/1961   Quit date: 06/17/2011   Years since quitting: 12.0   Passive exposure: Past  Smokeless Tobacco Never   Social History   Substance and Sexual Activity  Alcohol Use Yes   Comment: occasional beer less than once a month   Social History   Substance and Sexual Activity  Drug Use No    Allergies: Allergies  Allergen Reactions   Codeine Anaphylaxis, Other (See Comments) and Shortness Of Breath    Tightness in chest  Other Reaction(s): chest  pain  Tightness in chest    Other reaction(s): Other (See Comments)  Tightness in chest  Tightness in chest  Tightness in chest    Medications: Current Outpatient Medications  Medication Sig Dispense Refill   ACCU-CHEK AVIVA PLUS test strip      albuterol (PROVENTIL) (2.5 MG/3ML) 0.083% nebulizer solution Take 3 mLs (2.5 mg total) by nebulization every 6 (six) hours as needed for wheezing or shortness of breath. 75 mL 4   albuterol (VENTOLIN HFA) 108 (90 Base) MCG/ACT inhaler USE 2 INHALATIONS BY MOUTH  EVERY 4 HOURS AS NEEDED FOR SHORTNESS OF BREATH OR  WHEEZE 54 g 1   allopurinol (ZYLOPRIM) 100 MG tablet Taking two tablets by mouth in the morning and 1 tablet by mouth in the evening     apixaban (ELIQUIS) 5 MG TABS tablet Take 1 tablet by mouth twice daily 180 tablet 1   Ascorbic Acid (VITAMIN C PO) Take 2,000 mg by mouth daily.     aspirin 81 MG EC tablet Take 81 mg by mouth in the morning.     Blood Glucose Monitoring Suppl (ONE TOUCH ULTRA 2) W/DEVICE KIT by Does not apply route.     Cholecalciferol (VITAMIN D3) 2000 UNITS TABS Take 1 capsule by mouth in the morning and at bedtime.     dapagliflozin propanediol (FARXIGA) 10 MG TABS tablet Take 1 tablet (10 mg total) by mouth daily. 90 tablet 3   diphenhydramine-acetaminophen (TYLENOL PM) 25-500 MG TABS tablet Take 2 tablets by mouth at bedtime.     ezetimibe (ZETIA) 10 MG tablet Take 10 mg by mouth in the morning.     fluticasone (FLONASE) 50 MCG/ACT nasal spray Place 1 spray into both nostrils daily. 16 g 2   Fluticasone-Umeclidin-Vilant (TRELEGY ELLIPTA) 100-62.5-25 MCG/ACT AEPB Inhale 1 puff into the lungs daily. 90 each 4   folic acid (FOLVITE) 400 MCG tablet Take 400 mcg by mouth every evening.     HUMALOG KWIKPEN 200 UNIT/ML KwikPen 20 Units as needed. On a sliding scale     loratadine (CLARITIN) 10 MG tablet Take 10 mg by mouth in the morning.     losartan (COZAAR) 50 MG tablet Take 1 tablet (50 mg total) by mouth daily. 90  tablet 3   melatonin 5 MG TABS Take 5 mg by mouth at bedtime.     metoprolol (TOPROL-XL) 200 MG 24 hr tablet TAKE 1 TABLET BY MOUTH DAILY  WITH OR IMMEDIATELY FOLLOWING A  MEAL 100 tablet 2   Multiple Vitamins-Minerals (CENTRUM CARDIO) TABS Take 1 tablet by mouth in the morning.     omega-3 acid ethyl esters (LOVAZA) 1 g capsule Take 2 g by mouth 2 (two) times daily.     oxymetazoline (AFRIN) 0.05 % nasal spray Place 1 spray into both nostrils as needed for congestion.     OZEMPIC, 0.25 OR 0.5 MG/DOSE, 2 MG/3ML SOPN Inject 0.5 Units into the skin once a week.  potassium chloride SA (KLOR-CON M) 20 MEQ tablet TAKE 3 TABLETS BY MOUTH 3 TIMES  DAILY 810 tablet 3   RELION PEN NEEDLE 31G/8MM 31G X 8 MM MISC 3 (three) times daily.     rosuvastatin (CRESTOR) 40 MG tablet TAKE 1 TABLET BY MOUTH DAILY 100 tablet 2   sertraline (ZOLOFT) 100 MG tablet Take 150 mg by mouth in the morning.     spironolactone (ALDACTONE) 25 MG tablet TAKE 1 TABLET BY MOUTH DAILY 100 tablet 2   torsemide (DEMADEX) 20 MG tablet TAKE 4 TABLETS BY MOUTH TWICE  DAILY MAY TAKE AN ADDITIONAL 1  TABLET BY MOUTH DAILY AS NEEDED  FOR 2 TO 3 LBS WEIGHT GAIN IN 1  DAY OR 5 LBS IN 1 WEEK 720 tablet 2   zinc sulfate 220 (50 Zn) MG capsule Take 220 mg by mouth every evening.     No current facility-administered medications for this visit.    Review of Systems: GENERAL: negative for malaise, night sweats HEENT: No changes in hearing or vision, no nose bleeds or other nasal problems. NECK: Negative for lumps, goiter, pain and significant neck swelling RESPIRATORY: Negative for cough, wheezing CARDIOVASCULAR: Negative for chest pain, leg swelling, palpitations, orthopnea GI: SEE HPI MUSCULOSKELETAL: Negative for joint pain or swelling, back pain, and muscle pain. SKIN: Negative for lesions, rash HEMATOLOGY Negative for prolonged bleeding, bruising easily, and swollen nodes. ENDOCRINE: Negative for cold or heat intolerance, polyuria,  polydipsia and goiter. NEURO: negative for tremor, gait imbalance, syncope and seizures. The remainder of the review of systems is noncontributory.   Physical Exam: BP 131/75 (BP Location: Right Arm, Patient Position: Sitting, Cuff Size: Large)   Pulse 70   Temp 98.6 F (37 C) (Oral)   Ht 5' 11.5" (1.816 m)   Wt 271 lb 4.8 oz (123.1 kg)   BMI 37.31 kg/m  GENERAL: The patient is AO x3, in no acute distress. HEENT: Head is normocephalic and atraumatic. EOMI are intact. Mouth is well hydrated and without lesions. NECK: Supple. No masses LUNGS: Clear to auscultation. No presence of rhonchi/wheezing/rales. Adequate chest expansion HEART: RRR, normal s1 and s2. ABDOMEN: Soft, nontender, no guarding, no peritoneal signs, and nondistended. BS +. No masses.  Imaging/Labs: as above     Latest Ref Rng & Units 08/29/2021    4:39 AM 08/28/2021    6:21 PM 08/21/2021    3:49 PM  CBC  WBC 4.0 - 10.5 K/uL 5.0  5.4  5.2   Hemoglobin 13.0 - 17.0 g/dL 29.5  62.1  30.8   Hematocrit 39.0 - 52.0 % 47.2  43.2  41.5   Platelets 150 - 400 K/uL 120  192  149    No results found for: "IRON", "TIBC", "FERRITIN"  I personally reviewed and interpreted the available labs, imaging and endoscopic files.  Ultrasound 11/2022    Recent labs done 05/31/2023 AST 51 ALT 71 alk phos 129 T. bili 0.7 creatinine 0.74 hemoglobin A1c 7.1 Impression and Plan:  Tommy Riley is a 77 y.o. male with significant co-morbidities including CKD, emphysema who continues on oxygen, CHF, diabetes, Afib, hypertension who presents for evaluation of Elevated liver enzymes , MASLD and imaging concerning for cirrhosis .  #?Cirrhosis  # Elevated liver enzymes  This is likely due to MASLD with risk factors BMI 37 and metabolic syndrome ( DM, HTN) Hepatocellular pattern elevation liver enzymes  Fib 4 Score 0.14  ( Although Fib-4 interpretation is not validated for people under 35 or  over 20 years of age.)   Last ultrasound  with possible cirrhosis and splenomegaly.  Given thrombocytopenia patient likely has compensated cirrhosis   Will evaluate for CSPH (clinically significant portal hypertension)/degree of fibrosis  with Fibroscan/elastrography    Given elevation in ALT/AST will obtain baseline viral hepatitis profile, AMA, and autoimmune serologies and elastography to assess degree of fibrosis  # Hepatic encephalopathy None on exam  - Avoid opiates or benzodiazepines  # Ascites None on exam but will evaluate with ultrasound  - Low sodium diet - Diuretics : patient already on torsemide   # Esophageal varices - Patient has splenomegaly and thrombocytopenia hence likely has pHTN. He is already on betablocker and upper endoscopy is not indicated as betablocker will act as prophylaxis against varices   # HCC screening If elastography with significant fibrosis and cirrhosis; would recommend every 6 ultrasound and AFP  Recommendation   - Check CBC, MELD labs and AFP - Schedule liver US with electrography  - Reduce salt intake to <2 g per day - Can take Tylenol max of 2 g per day (650 mg q8h) for pain - Avoid NSAIDs for pain - Avoid eating raw oysters/shellfish  #HCM   Discussed with patient risk indication limitation of colon cancer screening with colonoscopy or stool based testing.  Patient reports last colonoscopy he had hypoxic event given his condition.  At this time given patient advanced age and significant comorbidities would like to defer any elective procedure at this time   Orders Placed This Encounter  Procedures   [place] ELASTOGRAPHY LIVER   ANA   Protime-INR   Iron, TIBC and Ferritin Panel   Mitochondrial antibodies   Comprehensive metabolic panel   Hepatitis A antibody, total   Hepatitis B core antibody, total   Hepatitis B surface antibody,qualitative   Hepatitis B surface antigen   Hepatitis C antibody   HIV Antibody (routine testing w rflx)   Anti-smooth muscle antibody, IgG    AFP tumor marker    All questions were answered.      Tommy Lawman, MD Gastroenterology and Hepatology [facility] Gastroenterology   This chart has been completed using [facility] Dictation software, and while attempts have been made to ensure accuracy , certain words and phrases may not be transcribed as intended

## 2023-06-14 NOTE — Addendum Note (Signed)
Addended by: Vista Lawman on: 06/14/2023 02:22 PM   Modules accepted: Orders

## 2023-06-14 NOTE — Patient Instructions (Addendum)
It was very nice to meet you today, as dicussed with will plan for the following :  1) Labwork and ultrasound

## 2023-06-15 NOTE — Telephone Encounter (Signed)
Pt left message returning call. Returned call to pt and advised him of ultrasound. Pt states wife received message on cell phone regarding [place].

## 2023-06-16 LAB — COMPREHENSIVE METABOLIC PANEL
ALT: 83 [IU]/L — ABNORMAL HIGH (ref 0–44)
AST: 75 [IU]/L — ABNORMAL HIGH (ref 0–40)
Albumin: 3.9 g/dL (ref 3.8–4.8)
Alkaline Phosphatase: 121 [IU]/L (ref 44–121)
BUN/Creatinine Ratio: 26 — ABNORMAL HIGH (ref 10–24)
BUN: 32 mg/dL — ABNORMAL HIGH (ref 8–27)
Bilirubin Total: 0.6 mg/dL (ref 0.0–1.2)
CO2: 28 mmol/L (ref 20–29)
Calcium: 9.1 mg/dL (ref 8.6–10.2)
Chloride: 98 mmol/L (ref 96–106)
Creatinine, Ser: 1.23 mg/dL (ref 0.76–1.27)
Globulin, Total: 2.3 g/dL (ref 1.5–4.5)
Glucose: 178 mg/dL — ABNORMAL HIGH (ref 70–99)
Potassium: 4.4 mmol/L (ref 3.5–5.2)
Sodium: 144 mmol/L (ref 134–144)
Total Protein: 6.2 g/dL (ref 6.0–8.5)
eGFR: 61 mL/min/{1.73_m2} (ref >59)

## 2023-06-16 LAB — IRON,TIBC AND FERRITIN PANEL
Ferritin: 112 ng/mL (ref 30–400)
Iron Saturation: 13 % — ABNORMAL LOW (ref 15–55)
Iron: 45 ug/dL (ref 38–169)
Total Iron Binding Capacity: 340 ug/dL (ref 250–450)
UIBC: 295 ug/dL (ref 111–343)

## 2023-06-16 LAB — HEPATITIS A ANTIBODY, TOTAL: hep A Total Ab: NEGATIVE

## 2023-06-16 LAB — HEPATITIS B SURFACE ANTIBODY,QUALITATIVE: Hep B Surface Ab, Qual: NONREACTIVE

## 2023-06-16 LAB — ANA

## 2023-06-16 LAB — HIV ANTIBODY (ROUTINE TESTING W REFLEX): HIV Screen 4th Generation wRfx: NONREACTIVE

## 2023-06-16 LAB — ANTI-SMOOTH MUSCLE ANTIBODY, IGG: Smooth Muscle Ab: 10 U (ref 0–19)

## 2023-06-16 LAB — HEPATITIS B CORE ANTIBODY, TOTAL: Hep B Core Total Ab: NEGATIVE

## 2023-06-16 LAB — PROTIME-INR
INR: 1.1 (ref 0.9–1.2)
Prothrombin Time: 12.2 s — ABNORMAL HIGH (ref 9.1–12.0)

## 2023-06-16 LAB — HEPATITIS C ANTIBODY: Hep C Virus Ab: NONREACTIVE

## 2023-06-16 LAB — MITOCHONDRIAL ANTIBODIES: Mitochondrial Ab: 36.6 U — ABNORMAL HIGH (ref 0.0–20.0)

## 2023-06-16 LAB — HEPATITIS B SURFACE ANTIGEN: Hepatitis B Surface Ag: NEGATIVE

## 2023-06-16 LAB — AFP TUMOR MARKER: AFP, Serum, Tumor Marker: 4.4 ng/mL (ref 0.0–8.4)

## 2023-06-17 NOTE — Progress Notes (Signed)
Hi Mitzie,Can you please schedule a follow up appointment for this patient in 6-8 weeks with me  Hi Toniann Fail   Can you tell patient that blood work shows liver enzymes similar as before  Bloodwork shows you are not immune against hepatitis A and B and would recommend vaccination against that  I am awaiting ultrasound to be done and look forward to see you in clinic again in the next couple weeks   Thanks,  Vista Lawman , MD Gastroenterology and Hepatology [facility] Gastroenterology ===============  INR: 1.1 AMA 36 (elevated) AST 75 ALT 83 (elevated) Alk phos 121 normal T. bili Negative ANA, ASMA Hep A nonimmune Hep B nonexposed nonimmune Hep C negative HIV negative Normal AFP  Need IgM , GGT in future , repeat labwork in 3-4 months May need biopsy for positive AMA  Low Tsat, normal ferritin  will check CBC in future

## 2023-06-18 ENCOUNTER — Telehealth (INDEPENDENT_AMBULATORY_CARE_PROVIDER_SITE_OTHER): Payer: Self-pay | Admitting: *Deleted

## 2023-06-18 ENCOUNTER — Encounter (INDEPENDENT_AMBULATORY_CARE_PROVIDER_SITE_OTHER): Payer: Self-pay | Admitting: *Deleted

## 2023-06-18 NOTE — Telephone Encounter (Signed)
Tommy Riley from [facility] at [facility] in [place]. Wanted to let you know they attempted liver elastrography but unable to complete due to his size. The window that they have to sample the liver was too narrow for machine to get any accurate reading.

## 2023-06-21 ENCOUNTER — Telehealth (INDEPENDENT_AMBULATORY_CARE_PROVIDER_SITE_OTHER): Payer: Self-pay | Admitting: *Deleted

## 2023-06-21 NOTE — Telephone Encounter (Signed)
If he has no symptoms of heart burn , he does not need to take it . He can hold off taking the medication

## 2023-06-21 NOTE — Telephone Encounter (Signed)
Patient states he was not aware any meds were being sent in for him and he received pantoprozole 40mg  from pharmacy. He called and asked what it was for and I told him GERD and he said he does not have any acid reflux or heartburn. He is thinking this was not suppose to be sent in for him or does he need to take this med?   [phone]

## 2023-06-22 NOTE — Telephone Encounter (Signed)
At this time I recommend them to follow up in GI clinic , again I would need results of abdominal ultrasound also

## 2023-06-22 NOTE — Telephone Encounter (Signed)
Patient notified

## 2023-06-22 NOTE — Telephone Encounter (Signed)
Patient called to follow up on this message to see what the next step is

## 2023-06-23 ENCOUNTER — Encounter (INDEPENDENT_AMBULATORY_CARE_PROVIDER_SITE_OTHER): Payer: Self-pay | Admitting: *Deleted

## 2023-06-23 NOTE — Telephone Encounter (Signed)
Patient has a follow up on 08/04/23. I called ultrasound at [facility] and spoke with richard and discuss the issue patient had at [facility] - they attempted liver elastrography but unable to complete due to his size. The window that they have to sample the liver was too narrow for machine to get any accurate reading.   Richard told me his size was not an issue at [facility] and they would be able to do his elastrography. Kenney Houseman, does this need a  precert? I can let the patient know we can get him scheduled at [facility].

## 2023-06-24 NOTE — Telephone Encounter (Signed)
Per Kenney Houseman, no precert needed and I scheduled the [place] at [facility] and pt was notified of appt.

## 2023-06-25 ENCOUNTER — Encounter: Payer: Self-pay | Admitting: Cardiology

## 2023-06-25 ENCOUNTER — Ambulatory Visit: Payer: Medicare Other | Attending: Cardiology | Admitting: Cardiology

## 2023-06-25 VITALS — BP 118/68 | HR 70 | Ht 71.5 in | Wt 268.6 lb

## 2023-06-25 DIAGNOSIS — Z95810 Presence of automatic (implantable) cardiac defibrillator: Secondary | ICD-10-CM | POA: Diagnosis not present

## 2023-06-25 DIAGNOSIS — G4733 Obstructive sleep apnea (adult) (pediatric): Secondary | ICD-10-CM

## 2023-06-25 DIAGNOSIS — I5022 Chronic systolic (congestive) heart failure: Secondary | ICD-10-CM | POA: Diagnosis not present

## 2023-06-25 DIAGNOSIS — I4821 Permanent atrial fibrillation: Secondary | ICD-10-CM | POA: Diagnosis not present

## 2023-06-25 NOTE — Progress Notes (Signed)
Cardiology Office Note  Date: 06/25/2023   ID: JAKELL DELSORDO, DOB September 02, 1945, MRN [id number]  History of Present Illness: Tommy Riley is a 77 y.o. male last seen in June.  He is here for a follow-up visit.  Reports no major change in status.  Still with NYHA class III symptoms, remains on supplemental oxygen.  His weight is down a few pounds, he uses compression stockings regularly and does not describe any worsening peripheral edema.  He had a follow-up visit with Dr. Gala Romney in September, I reviewed the note.  Echocardiogram from that time as noted below as well.  [employer] CRT-D in place with follow-up by Dr. Ladona Ridgel.  He reports no device shocks or syncope.  Device interrogation in October revealed normal function.  I reviewed his medications.  Current cardiac regimen includes Eliquis, aspirin, Farxiga, Zetia, Ozempic, Cozaar, Toprol-XL, Lovaza, Demadex, potassium supplement, Aldactone, and Crestor.  I went over his most recent lab work which is noted below.  Physical Exam: VS:  BP 118/68 (BP Location: Right Arm, Cuff Size: Normal)   Pulse 70   Ht 5' 11.5" (1.816 m)   Wt 268 lb 9.6 oz (121.8 kg)   SpO2 97%   BMI 36.94 kg/m , BMI Body mass index is 36.94 kg/m.  Wt Readings from Last 3 Encounters:  06/25/23 268 lb 9.6 oz (121.8 kg)  06/14/23 271 lb 4.8 oz (123.1 kg)  04/05/23 269 lb (122 kg)    General: Patient appears comfortable at rest. HEENT: Conjunctiva and lids normal. Neck: Supple, no elevated JVP or carotid bruits. Lungs: Clear to auscultation, nonlabored breathing at rest. Cardiac: Regular rate and rhythm, no S3, 1/6 systolic murmur, no pericardial rub. Extremities: Compression stockings in place with no pitting edema.  ECG:  An ECG dated 09/10/2022 was personally reviewed today and demonstrated:  Ventricular paced rhythm.  Labwork: November 2024: Cholesterol 130, triglycerides 194, HDL 36, LDL 55, AST 51, ALT 71, BUN 20, creatinine 0.74,  potassium 4.2, hemoglobin A1c 7.1% 06/14/2023: ALT 83; AST 75; BUN 32; Creatinine, Ser 1.23; Potassium 4.4; Sodium 144   Other Studies Reviewed Today:  Echocardiogram 04/05/2023:  1. Abnormal septal motion inferior basal hypokinesis Abnormal strain with  some apical sparing . Left ventricular ejection fraction, by estimation,  is 45 to 50%. The left ventricle has mildly decreased function. The left  ventricle has no regional wall  motion abnormalities. There is moderate left ventricular hypertrophy. Left  ventricular diastolic parameters are indeterminate. The average left  ventricular global longitudinal strain is -12.6 %. The global longitudinal  strain is abnormal.   2. Device wires in RA/RV . Right ventricular systolic function is normal.  The right ventricular size is normal.   3. Left atrial size was severely dilated.   4. The mitral valve is abnormal. Mild mitral valve regurgitation. No  evidence of mitral stenosis.   5. The aortic valve is tricuspid. There is mild calcification of the  aortic valve. Aortic valve regurgitation is not visualized. Aortic valve  sclerosis is present, with no evidence of aortic valve stenosis.   6. The inferior vena cava is dilated in size with >50% respiratory  variability, suggesting right atrial pressure of 8 mmHg.   Assessment and Plan:  1.  HFmrEF with mixed ischemic/nonischemic cardiomyopathy, LVEF 45 to 50% by echocardiogram in September.  Clinically stable with NYHA class III symptoms which are multifactorial.  No evidence of progressive fluid retention on current regimen.  Continue Toprol-XL, Cozaar, Farxiga, Ozempic,  Aldactone, and Demadex with potassium supplement.  Recent lab work shows creatinine 1.23 and potassium 4.4.  He will have follow-up with Dr. Gala Romney in February.   2.  [employer] CRT-D in place with follow-up by Dr. Ladona Ridgel.  No device shocks or syncope.   3.  Permanent atrial fibrillation with history of AV node  ablation.  CHA2DS2-VASc score is 7.  He is asymptomatic.  Continue Eliquis for stroke prophylaxis.  No spontaneous bleeding problems reported.   4.  Pulmonary hypertension, WHO group 2 and 3.   5.  Multivessel CAD status post CABG in 2001 with LIMA to distal LAD and the distal circumflex marginal as well as SVG to PDA.  Grafts were patent at angiography in October 2022.  No angina.  He continues on aspirin, Crestor, Zetia, and Lovaza.  LDL 55 and triglycerides 194 in November.   6.  OSA on CPAP.  Reports compliance with treatment.  Disposition:  Follow up  6 months.  Signed, Jonelle Sidle, M.D., F.A.C.C. [facility]

## 2023-06-25 NOTE — Patient Instructions (Addendum)

## 2023-07-05 ENCOUNTER — Ambulatory Visit (HOSPITAL_COMMUNITY)
Admission: RE | Admit: 2023-07-05 | Discharge: 2023-07-05 | Disposition: A | Discharge status: Home / Self Care | Payer: Medicare Other | Source: Ambulatory Visit | Attending: Gastroenterology | Admitting: Gastroenterology

## 2023-07-05 DIAGNOSIS — K7460 Unspecified cirrhosis of liver: Secondary | ICD-10-CM | POA: Diagnosis present

## 2023-07-05 DIAGNOSIS — R748 Abnormal levels of other serum enzymes: Secondary | ICD-10-CM | POA: Insufficient documentation

## 2023-07-09 ENCOUNTER — Encounter (INDEPENDENT_AMBULATORY_CARE_PROVIDER_SITE_OTHER): Payer: Self-pay | Admitting: *Deleted

## 2023-07-19 ENCOUNTER — Ambulatory Visit (INDEPENDENT_AMBULATORY_CARE_PROVIDER_SITE_OTHER): Payer: Medicare Other | Admitting: Gastroenterology

## 2023-07-20 ENCOUNTER — Ambulatory Visit (INDEPENDENT_AMBULATORY_CARE_PROVIDER_SITE_OTHER): Payer: Medicare Other | Admitting: Gastroenterology

## 2023-07-22 ENCOUNTER — Other Ambulatory Visit: Payer: Self-pay | Admitting: Cardiology

## 2023-07-22 ENCOUNTER — Telehealth: Payer: Self-pay | Admitting: Cardiology

## 2023-07-22 ENCOUNTER — Telehealth (INDEPENDENT_AMBULATORY_CARE_PROVIDER_SITE_OTHER): Payer: Self-pay

## 2023-07-22 MED ORDER — APIXABAN 5 MG PO TABS: 5.0000 mg | ORAL_TABLET | Freq: Two times a day (BID) | ORAL | 1 refills | Status: DC

## 2023-07-22 NOTE — Telephone Encounter (Signed)
Prescription refill request for Eliquis received. Indication: AF Last office visit:  06/25/23  Ival Bible MD Scr: 1.23 on 06/14/23  Epic Age: 78 Weight: 121.8kg  Based on above findings Eliquis 5mg  twice daily is the appropriate dose.  Refill approved.

## 2023-07-22 NOTE — Telephone Encounter (Signed)
*  STAT* If patient is at the pharmacy, call can be transferred to refill team.   1. Which medications need to be refilled? (please list name of each medication and dose if known)   apixaban (ELIQUIS) 5 MG TABS tabl    2. Which pharmacy/location (including street and city if local pharmacy) is medication to be sent to?  [facility] - [place], [place] - [address] Phone: [phone]  Fax: [phone]      3. Do they need a 30 day or 90 day supply? 90  5 day supply left

## 2023-07-22 NOTE — Telephone Encounter (Signed)
I spoke with the patient made him aware of what pharmacist, Toniann Fail at [facility] [place], Mal[facility] said about him being due for the second dose of Twinrix (Hep A&B Combination) now, and the cost would be approximately about the same, maybe a little more since the deductible started over for 2025. I made him aware of what she said he paid for the first dose and he says he did not pay for anything while there on 06/18/2023. I also made him aware that she says [facility] is the preferred company of choice for [employer], and may be cheaper.I advised if [facility] did decide [facility] shot to get the print out from [place] on what vaccine he has already had [facility]), as to make sure he gets the exact vaccination the second time. He says he did not [facility]. I advised the patient to speak with [facility] pharmacy regarding the discrepancy of payment and ask what he needed to do. I asked if he needed [place] for anything to contact us. Patient states understanding.   The Authorization we received was not for Twinrix, but for Hep B Vaccination only. So if an authorization is needed the pharmacy will need to send [place] the Key for Cover my meds for Twinrix, not just the Hep B vaccination.

## 2023-07-22 NOTE — Telephone Encounter (Signed)
Per Dr. Sula Soda noted dated 06/14/2023: Blood work shows you are not immune against hepatitis A and B and would recommend vaccination against that.   I received a Prior Authorization from [facility] for the patient's Hepatitis B Vaccination, which we never have to do usually on Vaccinations. I called and spoke with the patient whom says he received his first round of the vaccinations on 06/18/2023, he was told to come back 30 days later for the second one. He says he went to get the second part done and they told him it was not time to get it, he was there too early to receive the second part.  He says the next day his insurance called him and told him we would need to do a prior authorization on this. I called [facility] and they told me the patient would need authorization. (But the PA is for just the Hepatitis B Vaccine alone, not Twinrix, which is the combination vaccine he received the first time).  I called the [facility] in [place], [place] and spoke with the pharmacist Toniann Fail, whom says the patient received his first dose of Twinrix, which is a combination vaccination, that includes both the Hep A and Hep B Vaccine. He received this on 06/18/2023, and was due for the second dose on 07/19/2023. She says the patient paid for the 1st dose $142.05 and the second dose will cost around the same amount, maybe more due to the insurance deductible renewing on 07/07/2023. I asked if a co pay card could be used, she says they tried this and it would not cover or make the price lower. Toniann Fail the pharmacist says that if the patient insurance changed to [facility], they prefer [facility] and the patient could go there for the second dose and see if it is discounted through them as they are the preferred pharmacy of use.

## 2023-07-30 ENCOUNTER — Other Ambulatory Visit: Payer: Self-pay | Admitting: Cardiology

## 2023-08-04 ENCOUNTER — Ambulatory Visit (INDEPENDENT_AMBULATORY_CARE_PROVIDER_SITE_OTHER): Payer: Medicare Other | Admitting: Gastroenterology

## 2023-08-04 VITALS — BP 137/74 | HR 70 | Temp 97.5°F | Ht 71.5 in | Wt 268.0 lb

## 2023-08-04 DIAGNOSIS — R7989 Other specified abnormal findings of blood chemistry: Secondary | ICD-10-CM | POA: Diagnosis not present

## 2023-08-04 DIAGNOSIS — K760 Fatty (change of) liver, not elsewhere classified: Secondary | ICD-10-CM

## 2023-08-04 DIAGNOSIS — D696 Thrombocytopenia, unspecified: Secondary | ICD-10-CM

## 2023-08-04 DIAGNOSIS — R768 Other specified abnormal immunological findings in serum: Secondary | ICD-10-CM | POA: Diagnosis not present

## 2023-08-04 DIAGNOSIS — R161 Splenomegaly, not elsewhere classified: Secondary | ICD-10-CM

## 2023-08-04 DIAGNOSIS — R748 Abnormal levels of other serum enzymes: Secondary | ICD-10-CM

## 2023-08-04 DIAGNOSIS — K7460 Unspecified cirrhosis of liver: Secondary | ICD-10-CM

## 2023-08-04 DIAGNOSIS — Z6837 Body mass index (BMI) 37.0-37.9, adult: Secondary | ICD-10-CM

## 2023-08-04 NOTE — Progress Notes (Signed)
Vista Lawman , M.D. Gastroenterology & Hepatology [facility] Gastroenterology [address] [place], [place] [address] Primary Care Physician: Pomposini, Rande Brunt, MD No address on file  Chief Complaint:  Elevated liver enzymes , MASLD and imaging concerning for cirrhosis   History of Present Illness: Tommy Riley is a 78 y.o. male with significant co-morbidities including CKD, emphysema who continues on oxygen, CHF, diabetes, Afib, hypertension who presents for evaluation of Elevated liver enzymes , MASLD and imaging concerning for cirrhosis .  Patient was last seen December 2024 imaging finding of possible cirrhosis/splenomegaly and elevated liver enzymes  Patient is accompanying with wife in clinic today.    Denies any herbal medications or new medications.  Patient has been using Ozempic and has lost 20 pounds since.  Reports he has good appetite denies any dysphagia. The patient denies having any nausea, vomiting, fever, chills, hematochezia, melena, hematemesis, abdominal distention, abdominal pain, diarrhea, jaundice, pruritus  Last UEA:VWUJ Last Colonoscopy:many years ago complicated with hypoxia   FHx: neg for any gastrointestinal/liver disease, no malignancies Social: neg smoking, alcohol or illicit drug use Surgical: no abdominal surgeries  Past Medical History: Past Medical History:  Diagnosis Date   Anxiety    Atrial fibrillation and flutter (HCC)    Atrial fibrillation ablation 2011 at [facility]   CAD in native artery    Chronic systolic heart failure (HCC)    CKD (chronic kidney disease), stage III (HCC)    COPD (chronic obstructive pulmonary disease) (HCC)    COVID-22 February 2020   Depression    Essential hypertension    Gout    Hyperlipidemia    Ischemic heart disease    OSA (obstructive sleep apnea)    Stroke (HCC) 2003   Type 2 diabetes mellitus (HCC)     Past Surgical History: Past Surgical History:  Procedure  Laterality Date   ATRIAL FIBRILLATION ABLATION  2011   [facility]   AV NODE ABLATION N/A 05/07/2021   Procedure: AV NODE ABLATION;  Surgeon: Marinus Maw, MD;  Location: [facility] INVASIVE CV LAB;  Service: Cardiovascular;  Laterality: N/A;   AV NODE ABLATION N/A 09/01/2021   Procedure: AV NODE ABLATION;  Surgeon: Marinus Maw, MD;  Location: [facility] INVASIVE CV LAB;  Service: Cardiovascular;  Laterality: N/A;   CATARACT EXTRACTION Bilateral    CORONARY ARTERY BYPASS GRAFT  2001   HERNIA REPAIR Right    ICD IMPLANT N/A 05/07/2021   Procedure: ICD IMPLANT;  Surgeon: Marinus Maw, MD;  Location: [facility] INVASIVE CV LAB;  Service: Cardiovascular;  Laterality: N/A;   RIGHT/LEFT HEART CATH AND CORONARY/GRAFT ANGIOGRAPHY N/A 05/05/2021   Procedure: RIGHT/LEFT HEART CATH AND CORONARY/GRAFT ANGIOGRAPHY;  Surgeon: Runell Gess, MD;  Location: [facility] INVASIVE CV LAB;  Service: Cardiovascular;  Laterality: N/A;   TONSILLECTOMY AND ADENOIDECTOMY      Family History: Family History  Problem Relation Age of Onset   Raynaud syndrome Mother    Stroke Father    Diabetes Mellitus II Father    Hypertension Father    Hypertension Sister    Diabetes Mellitus II Sister    Stroke Brother    Crohn's disease Brother    Breast cancer Brother    COPD Brother     Social History: Social History   Tobacco Use  Smoking Status Former   Current packs/day: 0.00   Average packs/day: 2.5 packs/day for 50.0 years (125.0 ttl pk-yrs)   Types: Cigarettes   Start  date: 06/16/1961   Quit date: 06/17/2011   Years since quitting: 12.0   Passive exposure: Past  Smokeless Tobacco Never   Social History   Substance and Sexual Activity  Alcohol Use Yes   Comment: occasional beer less than once a month   Social History   Substance and Sexual Activity  Drug Use No    Allergies: Allergies  Allergen Reactions   Codeine Anaphylaxis, Other (See Comments) and Shortness Of Breath    Tightness in chest  Other Reaction(s):  chest pain  Tightness in chest    Other reaction(s): Other (See Comments)  Tightness in chest  Tightness in chest  Tightness in chest    Medications: Current Outpatient Medications  Medication Sig Dispense Refill   ACCU-CHEK AVIVA PLUS test strip      albuterol (PROVENTIL) (2.5 MG/3ML) 0.083% nebulizer solution Take 3 mLs (2.5 mg total) by nebulization every 6 (six) hours as needed for wheezing or shortness of breath. 75 mL 4   albuterol (VENTOLIN HFA) 108 (90 Base) MCG/ACT inhaler USE 2 INHALATIONS BY MOUTH  EVERY 4 HOURS AS NEEDED FOR SHORTNESS OF BREATH OR  WHEEZE 54 g 1   allopurinol (ZYLOPRIM) 100 MG tablet Taking two tablets by mouth in the morning and 1 tablet by mouth in the evening     apixaban (ELIQUIS) 5 MG TABS tablet Take 1 tablet by mouth twice daily 180 tablet 1   Ascorbic Acid (VITAMIN C PO) Take 2,000 mg by mouth daily.     aspirin 81 MG EC tablet Take 81 mg by mouth in the morning.     Blood Glucose Monitoring Suppl (ONE TOUCH ULTRA 2) W/DEVICE KIT by Does not apply route.     Cholecalciferol (VITAMIN D3) 2000 UNITS TABS Take 1 capsule by mouth in the morning and at bedtime.     dapagliflozin propanediol (FARXIGA) 10 MG TABS tablet Take 1 tablet (10 mg total) by mouth daily. 90 tablet 3   diphenhydramine-acetaminophen (TYLENOL PM) 25-500 MG TABS tablet Take 2 tablets by mouth at bedtime.     ezetimibe (ZETIA) 10 MG tablet Take 10 mg by mouth in the morning.     fluticasone (FLONASE) 50 MCG/ACT nasal spray Place 1 spray into both nostrils daily. 16 g 2   Fluticasone-Umeclidin-Vilant (TRELEGY ELLIPTA) 100-62.5-25 MCG/ACT AEPB Inhale 1 puff into the lungs daily. 90 each 4   folic acid (FOLVITE) 400 MCG tablet Take 400 mcg by mouth every evening.     HUMALOG KWIKPEN 200 UNIT/ML KwikPen 20 Units as needed. On a sliding scale     loratadine (CLARITIN) 10 MG tablet Take 10 mg by mouth in the morning.     losartan (COZAAR) 50 MG tablet Take 1 tablet (50 mg total) by mouth  daily. 90 tablet 3   melatonin 5 MG TABS Take 5 mg by mouth at bedtime.     metoprolol (TOPROL-XL) 200 MG 24 hr tablet TAKE 1 TABLET BY MOUTH DAILY  WITH OR IMMEDIATELY FOLLOWING A  MEAL 100 tablet 2   Multiple Vitamins-Minerals (CENTRUM CARDIO) TABS Take 1 tablet by mouth in the morning.     omega-3 acid ethyl esters (LOVAZA) 1 g capsule Take 2 g by mouth 2 (two) times daily.     oxymetazoline (AFRIN) 0.05 % nasal spray Place 1 spray into both nostrils as needed for congestion.     OZEMPIC, 0.25 OR 0.5 MG/DOSE, 2 MG/3ML SOPN Inject 0.5 Units into the skin once a week.  potassium chloride SA (KLOR-CON M) 20 MEQ tablet TAKE 3 TABLETS BY MOUTH 3 TIMES  DAILY 810 tablet 3   RELION PEN NEEDLE 31G/8MM 31G X 8 MM MISC 3 (three) times daily.     rosuvastatin (CRESTOR) 40 MG tablet TAKE 1 TABLET BY MOUTH DAILY 100 tablet 2   sertraline (ZOLOFT) 100 MG tablet Take 150 mg by mouth in the morning.     spironolactone (ALDACTONE) 25 MG tablet TAKE 1 TABLET BY MOUTH DAILY 100 tablet 2   torsemide (DEMADEX) 20 MG tablet TAKE 4 TABLETS BY MOUTH TWICE  DAILY MAY TAKE AN ADDITIONAL 1  TABLET BY MOUTH DAILY AS NEEDED  FOR 2 TO 3 LBS WEIGHT GAIN IN 1  DAY OR 5 LBS IN 1 WEEK 720 tablet 2   zinc sulfate 220 (50 Zn) MG capsule Take 220 mg by mouth every evening.     No current facility-administered medications for this visit.    Review of Systems: GENERAL: negative for malaise, night sweats HEENT: No changes in hearing or vision, no nose bleeds or other nasal problems. NECK: Negative for lumps, goiter, pain and significant neck swelling RESPIRATORY: Negative for cough, wheezing CARDIOVASCULAR: Negative for chest pain, leg swelling, palpitations, orthopnea GI: SEE HPI MUSCULOSKELETAL: Negative for joint pain or swelling, back pain, and muscle pain. SKIN: Negative for lesions, rash HEMATOLOGY Negative for prolonged bleeding, bruising easily, and swollen nodes. ENDOCRINE: Negative for cold or heat intolerance,  polyuria, polydipsia and goiter. NEURO: negative for tremor, gait imbalance, syncope and seizures. The remainder of the review of systems is noncontributory.   Physical Exam: BP 131/75 (BP Location: Right Arm, Patient Position: Sitting, Cuff Size: Large)   Pulse 70   Temp 98.6 F (37 C) (Oral)   Ht 5' 11.5" (1.816 m)   Wt 271 lb 4.8 oz (123.1 kg)   BMI 37.31 kg/m  GENERAL: The patient is AO x3, in no acute distress. HEENT: Head is normocephalic and atraumatic. EOMI are intact. Mouth is well hydrated and without lesions. NECK: Supple. No masses LUNGS: Clear to auscultation. No presence of rhonchi/wheezing/rales. Adequate chest expansion HEART: RRR, normal s1 and s2. ABDOMEN: Soft, nontender, no guarding, no peritoneal signs, and nondistended. BS +. No masses.  Imaging/Labs: as above     Latest Ref Rng & Units 08/29/2021    4:39 AM 08/28/2021    6:21 PM 08/21/2021    3:49 PM  CBC  WBC 4.0 - 10.5 K/uL 5.0  5.4  5.2   Hemoglobin 13.0 - 17.0 g/dL 86.5  78.4  69.6   Hematocrit 39.0 - 52.0 % 47.2  43.2  41.5   Platelets 150 - 400 K/uL 120  192  149    No results found for: "IRON", "TIBC", "FERRITIN"  I personally reviewed and interpreted the available labs, imaging and endoscopic files.  Elastrography 06/2023  Median kPa: 4.4   IQR: 3.1   IQR/Median kPa ratio: 0.70   Data quality: i. IQR/Median kPa ratio of 0.6 or greater when using the DAX probe indicates reduced accuracy    Ultrasound 11/2022    Recent labs done 05/31/2023 AST 51 ALT 71 alk phos 129 T. bili 0.7 creatinine 0.74 hemoglobin A1c 7.1   Workup :  INR: 1.1 AMA 36 (elevated) AST 75 ALT 83 (elevated) Alk phos 121 normal T. bili Negative ANA, ASMA Hep A nonimmune Hep B nonexposed nonimmune Hep C negative HIV negative Normal AFP  Impression and Plan:  GORJE IYER is a 78 y.o. male  with significant co-morbidities including CKD, emphysema who continues on oxygen, CHF, diabetes, Afib,  hypertension who presents for evaluation of Elevated liver enzymes , MASLD and imaging concerning for cirrhosis .  #?Cirrhosis  # Elevated liver enzymes   This is likely due to MASLD with risk factors BMI 37 and metabolic syndrome ( DM, HTN) Hepatocellular pattern elevation liver enzymes  Fib 4 Score 0.14  ( Although Fib-4 interpretation is not validated for people under 35 or over 71 years of age.)   Last ultrasound (11/2022) with possible cirrhosis and splenomegaly.  Given thrombocytopenia patient likely has compensated cirrhosis   Although recent elastography suggesting otherwise given kPA: 4.4 not suggesting advanced fibrosis   Workup above with negative hep A, B, and C, ANA ASMA but has elevated AMA (36)   Given continued elevated liver enzymes positive AMA which is a very specific marker for PBC, and contradicting imaging and Elastrogragraphy findings I discussed patient indication for liver biopsy to evaluate the cause of elevated liver enzymes, degree of fibrosis, rule out PBC-AIH overlap.  Patient agrees for liver biopsy  Obtain IgM  Studies have shown evidence of  positive AMA patient with normal alk phos found to have evidence of PBC well before ALP elevation  There is no establish guidelines on this but this is mutual discussion between patient and provider.  Dierdre Forth, Ma H, Ou X, You H, Theora Gianotti. The future risk of primary biliary cholangitis (PBC) is low among patients with incidental anti-mitochondrial antibodies but without baseline PBC. Hepatol Commun. 2022 Nov;6(11):3112-3119. doi: 10.1002/hep4.2067. Epub 2022 Aug 23. PMID: [id number]; PMCID: [id number].)  # Hepatic encephalopathy None on exam  - Avoid opiates or benzodiazepines  # Ascites None on exam but will evaluate with ultrasound  - Low sodium diet - Diuretics : patient already on torsemide   # Esophageal varices - Patient has splenomegaly and thrombocytopenia  hence likely has pHTN. He is already on betablocker and upper endoscopy is not indicated as betablocker will act as prophylaxis against varices   # HCC screening Elastography without significant fibrosis or ultrasound previously did show possible cirrhosis and splenomegaly; would continue to treat as if patient has advanced fibrosis and recommend every 6 ultrasound and AFP now  Recommendation   - Check CBC, MELD labs and AFP -Complete abdominal ultrasound - Reduce salt intake to <2 g per day - Can take Tylenol max of 2 g per day (650 mg q8h) for pain - Avoid NSAIDs for pain - Avoid eating raw oysters/shellfish - continue Vaccination against hepatitis A and B  ( he started it with PCP)   [facility]   Discussed with patient risk indication limitation of colon cancer screening with colonoscopy or stool based testing.  Patient reports last colonoscopy he had hypoxic event given his condition.  At this time given patient advanced age and significant comorbidities would like to defer any elective procedure at this time  Orders Placed This Encounter  Procedures   US BIOPSY (LIVER)   US Abdomen Complete   AFP tumor marker   Protime-INR   Comprehensive metabolic panel   IgM   CBC    All questions were answered.      Vista Lawman, MD Gastroenterology and Hepatology [facility] Gastroenterology   This chart has been completed using [facility] Dictation software, and while attempts have been made to ensure accuracy , certain words and phrases may not  be transcribed as intended

## 2023-08-04 NOTE — Progress Notes (Signed)
Jonelle Sidle, MD  Claudean Kinds Yes, may hold Eliquis as requested for biopsy.       Previous Messages    ----- Message ----- From: Claudean Kinds Sent: 08/04/2023   3:46 PM EST To: Jonelle Sidle, MD; Claudean Kinds Subject: request to hold blood thinner                  Good afternoon Dr. Diona Browner ,  Patient has US Biopsy scheduled on 08/20/23 . Requesting ok to hold Eliquis 48 hrs prior to procedure.      Claudean Kinds  [facility]  Centralized Scheduling for Imaging Services System-Wide Scheduler III Phone : [phone]  Fax: [phone]

## 2023-08-05 ENCOUNTER — Telehealth: Payer: Self-pay | Admitting: Nurse Practitioner

## 2023-08-05 ENCOUNTER — Ambulatory Visit: Payer: Medicare Other

## 2023-08-05 DIAGNOSIS — I255 Ischemic cardiomyopathy: Secondary | ICD-10-CM | POA: Diagnosis not present

## 2023-08-05 DIAGNOSIS — J9611 Chronic respiratory failure with hypoxia: Secondary | ICD-10-CM

## 2023-08-05 LAB — CUP PACEART REMOTE DEVICE CHECK
Battery Remaining Longevity: 126 mo
Battery Remaining Percentage: 100 %
Brady Statistic RA Percent Paced: 99 %
Brady Statistic RV Percent Paced: 0 %
Date Time Interrogation Session: 20250130020900
HighPow Impedance: 69 Ohm
Implantable Lead Connection Status: 753985
Implantable Lead Connection Status: 753985
Implantable Lead Connection Status: 753985
Implantable Lead Implant Date: 20221103
Implantable Lead Implant Date: 20221103
Implantable Lead Implant Date: 20221103
Implantable Lead Location: 753858
Implantable Lead Location: 753860
Implantable Lead Location: 753860
Implantable Lead Model: 138
Implantable Lead Model: 3830
Implantable Lead Model: 4671
Implantable Lead Serial Number: 304581
Implantable Lead Serial Number: 852093
Implantable Pulse Generator Implant Date: 20221103
Lead Channel Impedance Value: 532 Ohm
Lead Channel Impedance Value: 533 Ohm
Lead Channel Impedance Value: 559 Ohm
Lead Channel Setting Pacing Amplitude: 2.5 V
Lead Channel Setting Sensing Sensitivity: 0.5 mV
Lead Channel Setting Sensing Sensitivity: 1 mV
Pulse Gen Serial Number: 390312

## 2023-08-05 NOTE — Telephone Encounter (Signed)
Patient needs rx mailed to [facility] for albuterol (VENTOLIN HFA) 108 (90 Base) MCG/ACT inhaler ---He is a former Dr. Craige Cotta patient and has an appointment 09/23/23 with Micheline Maze, NP---patient call back  [phone]

## 2023-08-06 MED ORDER — ALBUTEROL SULFATE HFA 108 (90 BASE) MCG/ACT IN AERS: INHALATION_SPRAY | RESPIRATORY_TRACT | 1 refills | Status: DC

## 2023-08-06 MED ORDER — TRELEGY ELLIPTA 100-62.5-25 MCG/ACT IN AEPB: 1.0000 | INHALATION_SPRAY | Freq: Every day | RESPIRATORY_TRACT | 1 refills | Status: DC

## 2023-08-06 NOTE — Progress Notes (Signed)
ADVANCED HF CLINIC NOTE  Primary Care: Arlina Robes, MD [facility] Cardiologist: Diona Browner HF Cardiologist: Dr. Gala Romney  HPI: Tommy Riley is a 78 y.o. male with history of chronic systolic HF, ischemic cardiomyopathy, CAD s/p CABG in 2001, permanent atrial fibrillation s/p atrial fibrillation and atrial flutter ablation in 2001, OSA on CPAP, DM II.    Previously seen by Dr. Monia Pouch at [facility] for pulmonary hypertension in 2018. Felt to be multifactorial and likely combination of WHO groups 2 and 3.    Admitted 10/22 with a/c HF. Echo with LVEF 35-40%, RV mildly reduced, RVSP 66 mmHg, trivial MR.Diuresed with IV lasix, diamox, and metolazone. Has LBBB with wide QRS. Seen by EP and unclear how much he would benefit from CRT. Underwent L/RHC  with patent grafts (Y graft from LIMA to distal LAD and distal circumflex marginal branch, vein graft to PDA) and showed pulmonary hypertension, CO preserved. Pulmonary HTN felt be d/t combination of WHO Group II and III. cMRI 11/02 with evidence of mixed ischemic and nonischemic cardiomyopathy. LVEF 34%. Underwent CRT-D on 11/02, did not get AV nodal ablation. IV lasix switched to Torsemide. Counseled extensively on cutting back fluid intake. Continued on GDMT with digoxin, spironolactone, entresto, farxiga and metoprolol succinate.  Discharge weight 280 lbs.  Admitted in 2/23 with recurrent HF in setting of AF. Underwent diuresis and AV node ablation by Dr. Ladona Ridgel.  Echo 10/22 EF 35-40% RV mildly reduced   Saw Dr. Ladona Ridgel 4/23 and device reprogrammed rate from 80 down to 70.   Echo 03/18/22 EF 40-45%  Today he returns for HF follow up with his wife. Overall feeling fine. Main complaint is neuropathy. He has SOB walking short distances on flat ground. He walks with a cane, no falls. Occasional dizziness. Continues on 3L oxygen at home. Denies palpitations, abnormal bleeding, CP,  edema, or PND. Sleeps in an adjustable bed. Appetite ok. No fever or  chills. Weight at home 265 pounds. Taking all medications. Down 23 lbs on Ozempic. Wears CPAP. BP at home 116/60s.  Past Medical History:  Diagnosis Date   Anxiety    Atrial fibrillation and flutter (HCC)    Atrial fibrillation ablation 2011 at [facility]   CAD in native artery    Chronic systolic heart failure (HCC)    CKD (chronic kidney disease), stage III (HCC)    COPD (chronic obstructive pulmonary disease) (HCC)    COVID-22 February 2020   Depression    Essential hypertension    Gout    Hyperlipidemia    Ischemic heart disease    OSA (obstructive sleep apnea)    Stroke (HCC) 2003   Type 2 diabetes mellitus (HCC)    Current Outpatient Medications  Medication Sig Dispense Refill   ACCU-CHEK AVIVA PLUS test strip      albuterol (PROVENTIL) (2.5 MG/3ML) 0.083% nebulizer solution Take 3 mLs (2.5 mg total) by nebulization every 6 (six) hours as needed for wheezing or shortness of breath. 75 mL 4   albuterol (VENTOLIN HFA) 108 (90 Base) MCG/ACT inhaler USE 2 INHALATIONS BY MOUTH  EVERY 4 HOURS AS NEEDED FOR SHORTNESS OF BREATH OR  WHEEZE 54 g 1   allopurinol (ZYLOPRIM) 100 MG tablet Taking two tablets by mouth in the morning and 1 tablet by mouth in the evening     apixaban (ELIQUIS) 5 MG TABS tablet Take 1 tablet (5 mg total) by mouth 2 (two) times daily. 180 tablet 1   Ascorbic Acid (VITAMIN C PO)  Take 2,000 mg by mouth daily.     aspirin 81 MG EC tablet Take 81 mg by mouth in the morning.     Blood Glucose Monitoring Suppl (ONE TOUCH ULTRA 2) W/DEVICE KIT by Does not apply route.     Cholecalciferol (VITAMIN D3) 2000 UNITS TABS Take 1 capsule by mouth in the morning and at bedtime.     Continuous Glucose Sensor (DEXCOM G7 SENSOR) MISC See admin instructions.     dapagliflozin propanediol (FARXIGA) 10 MG TABS tablet Take 1 tablet (10 mg total) by mouth daily. 90 tablet 3   diphenhydramine-acetaminophen (TYLENOL PM) 25-500 MG TABS tablet Take 2 tablets by mouth at bedtime.      ezetimibe (ZETIA) 10 MG tablet Take 10 mg by mouth in the morning.     fluticasone (FLONASE) 50 MCG/ACT nasal spray Place 1 spray into both nostrils daily. 16 g 2   Fluticasone-Umeclidin-Vilant (TRELEGY ELLIPTA) 100-62.5-25 MCG/ACT AEPB Inhale 1 puff into the lungs daily. 90 each 1   folic acid (FOLVITE) 400 MCG tablet Take 400 mcg by mouth every evening.     HUMALOG KWIKPEN 200 UNIT/ML KwikPen 20 Units as needed. On a sliding scale     insulin detemir (LEVEMIR) 100 UNIT/ML injection Inject 50 Units into the skin 2 (two) times daily.     loratadine (CLARITIN) 10 MG tablet Take 10 mg by mouth in the morning.     losartan (COZAAR) 50 MG tablet TAKE 1 TABLET BY MOUTH DAILY 100 tablet 2   melatonin 5 MG TABS Take 5 mg by mouth at bedtime.     metoprolol (TOPROL-XL) 200 MG 24 hr tablet TAKE 1 TABLET BY MOUTH DAILY  WITH OR IMMEDIATELY FOLLOWING A  MEAL 100 tablet 2   Multiple Vitamins-Minerals (CENTRUM CARDIO) TABS Take 1 tablet by mouth in the morning.     omega-3 acid ethyl esters (LOVAZA) 1 g capsule Take 2 g by mouth 2 (two) times daily.     oxymetazoline (AFRIN) 0.05 % nasal spray Place 1 spray into both nostrils as needed for congestion.     OZEMPIC, 0.25 OR 0.5 MG/DOSE, 2 MG/3ML SOPN Inject 0.5 Units into the skin once a week. On Fridays     pantoprazole (PROTONIX) 40 MG tablet Take 1 tablet (40 mg total) by mouth daily before breakfast. 30 tablet 5   potassium chloride SA (KLOR-CON M) 20 MEQ tablet TAKE 3 TABLETS BY MOUTH 3 TIMES  DAILY 810 tablet 3   RELION PEN NEEDLE 31G/8MM 31G X 8 MM MISC 3 (three) times daily.     rosuvastatin (CRESTOR) 40 MG tablet TAKE 1 TABLET BY MOUTH DAILY 100 tablet 2   sertraline (ZOLOFT) 100 MG tablet Take 150 mg by mouth in the morning.     spironolactone (ALDACTONE) 25 MG tablet TAKE 1 TABLET BY MOUTH DAILY 100 tablet 2   torsemide (DEMADEX) 20 MG tablet TAKE 4 TABLETS BY MOUTH TWICE  DAILY MAY TAKE AN ADDITIONAL 1  TABLET BY MOUTH DAILY AS NEEDED  FOR 2 TO 3  LBS WEIGHT GAIN IN 1  DAY OR 5 LBS IN 1 WEEK 720 tablet 2   zinc sulfate 220 (50 Zn) MG capsule Take 220 mg by mouth every evening.     No current facility-administered medications for this encounter.   Allergies  Allergen Reactions   Codeine Anaphylaxis, Other (See Comments) and Shortness Of Breath    Tightness in chest  Other Reaction(s): chest pain  Tightness in chest  Other reaction(s): Other (See Comments)  Tightness in chest  Tightness in chest  Tightness in chest   Social History   Socioeconomic History   Marital status: Married    Spouse name: Perfecto Purdy   Number of children: 3   Years of education: 14   Highest education level: Some college, no degree  Occupational History    Comment: retired  Tobacco Use   Smoking status: Former    Current packs/day: 0.00    Average packs/day: 2.5 packs/day for 50.0 years (125.0 ttl pk-yrs)    Types: Cigarettes    Start date: 06/16/1961    Quit date: 06/17/2011    Years since quitting: 12.1    Passive exposure: Past   Smokeless tobacco: Never  Vaping Use   Vaping status: Former   Start date: 07/06/2017   Quit date: 04/19/2021   Substances: Flavoring  Substance and Sexual Activity   Alcohol use: Yes    Comment: occasional beer less than once a month   Drug use: No   Sexual activity: Not on file  Other Topics Concern   Not on file  Social History Narrative   Consumes no caffeine   Social Drivers of Corporate investment banker Strain: Low Risk  (05/01/2021)   Overall Financial Resource Strain (CARDIA)    Difficulty of Paying Living Expenses: Not hard at all  Food Insecurity: No Food Insecurity (05/01/2021)   Hunger Vital Sign    Worried About Running Out of Food in the Last Year: Never true    Ran Out of Food in the Last Year: Never true  Transportation Needs: No Transportation Needs (05/01/2021)   PRAPARE - Administrator, Civil Service (Medical): No    Lack of Transportation (Non-Medical): No   Physical Activity: Not on file  Stress: Not on file  Social Connections: Not on file  Intimate Partner Violence: Not on file   Family History  Problem Relation Age of Onset   Raynaud syndrome Mother    Stroke Father    Diabetes Mellitus II Father    Hypertension Father    Hypertension Sister    Diabetes Mellitus II Sister    Stroke Brother    Crohn's disease Brother    Breast cancer Brother    COPD Brother    BP (!) 144/76   Pulse 70   Wt 123 kg (271 lb 3.2 oz)   SpO2 97% Comment: Patient is on 3-liters of room oxygen.  BMI 37.30 kg/m   Wt Readings from Last 3 Encounters:  08/09/23 123 kg (271 lb 3.2 oz)  08/04/23 121.6 kg (268 lb)  06/25/23 121.8 kg (268 lb 9.6 oz)   PHYSICAL EXAM: General:  NAD. No resp difficulty, arrived in [facility] on oxygen, chronically-ill appearing. HEENT: Normal Neck: Supple. No JVD. Thick neck Cor: Regular rate & rhythm. No rubs, gallops or murmurs. Lungs: Clear Abdomen: Soft, obese, nontender, nondistended.  Extremities: No cyanosis, clubbing, rash, edema Neuro: Alert & oriented x 3, moves all 4 extremities w/o difficulty. Affect pleasant.  Device interrogation (personally reviewed): HL score 6, thoracic impedence ok no AF/VT, activity level 0.4 hr/day   ASSESSMENT & PLAN:  Chronic systolic HF: - EF 35-40% on echo, RV mildly reduced - Echo 03/18/22 EF 40-45% - Echo 9/24 EF 45-50%, moderate LVH, RV ok - Etiology of LV dysfunction unclear but cMRI suggest mixed ischemic/nonischemic CM - s/p [employer] CRT-D. Device interrogated personally.  - Stable NYHA III-  mostly limited by lung disease  and obesity. Volume status ok  - Continue torsemide 80 mg bid + 60 KCL bid - Have switched Entresto 24/26 bid to losartan 50 daily due to cost (wants financial room to afford Ozempic).  - Continue spironolactone 25 mg daily.  - Continue Toprol XL 200 mg daily. - Continue Farxiga 10 mg daily. No GU symptoms - No change in GDMT today  - Watch fluid  intake - ICD interrogated today as above - Labs today.  2. CAD  - s/p CABG - Grafts patent on cath 05/05/21. - No s/s angina  - Continue ASA + beta blocker + statin.   3. Pulmonary HTN - RHC 05/05/21:  RA 14 RV 68/7 PA 68/unrecorded (40) PCWP unobtained LVEDP 21 Fick  4.9/2.0  PVR (using LVEDP) = 3.9 WU - Mixed PH WHO Group II & III. - Continue torsemide and CPAP.  - Continue weight loss efforts   4. Chronic AF with LBBB and intermittent rate control  - Has been in AF > 10 years despite previous AF/AFL ablations - Now s/p AVN ablation and CRT-D implant 11/22. - Now off digoxin. - CHA2DS2-VASc score is 7. - Continue Eliquis. No bleeding    5. OSA - On CPAP. - No change   6. Morbid obesity - Body mass index is 37.3 kg/m. - Continue Ozempic. Has lost >20 lbs  7. Neuropathy - No change - Followed by his Neurologist, Dr. Frances Furbish.  8. DM2 - Per PCP - Continue Farxiga  Follow up in 4 months with Dr. Gala Romney  Jacklynn Ganong, FNP  1:49 PM

## 2023-08-06 NOTE — Telephone Encounter (Signed)
Patient advised that prescriptions have been sent to mail order. Nothing further needed.

## 2023-08-09 ENCOUNTER — Encounter: Payer: Self-pay | Admitting: Internal Medicine

## 2023-08-09 ENCOUNTER — Encounter (HOSPITAL_COMMUNITY): Payer: Self-pay

## 2023-08-09 ENCOUNTER — Ambulatory Visit (HOSPITAL_COMMUNITY)
Admission: RE | Admit: 2023-08-09 | Discharge: 2023-08-09 | Disposition: A | Discharge status: Home / Self Care | Payer: Medicare Other | Source: Ambulatory Visit | Attending: Family Medicine | Admitting: Family Medicine

## 2023-08-09 VITALS — BP 144/76 | HR 70 | Wt 271.2 lb

## 2023-08-09 DIAGNOSIS — Z6837 Body mass index (BMI) 37.0-37.9, adult: Secondary | ICD-10-CM | POA: Diagnosis not present

## 2023-08-09 DIAGNOSIS — Z7901 Long term (current) use of anticoagulants: Secondary | ICD-10-CM | POA: Diagnosis not present

## 2023-08-09 DIAGNOSIS — E1159 Type 2 diabetes mellitus with other circulatory complications: Secondary | ICD-10-CM

## 2023-08-09 DIAGNOSIS — Z794 Long term (current) use of insulin: Secondary | ICD-10-CM | POA: Insufficient documentation

## 2023-08-09 DIAGNOSIS — I4821 Permanent atrial fibrillation: Secondary | ICD-10-CM | POA: Insufficient documentation

## 2023-08-09 DIAGNOSIS — I255 Ischemic cardiomyopathy: Secondary | ICD-10-CM | POA: Diagnosis not present

## 2023-08-09 DIAGNOSIS — E1140 Type 2 diabetes mellitus with diabetic neuropathy, unspecified: Secondary | ICD-10-CM | POA: Diagnosis not present

## 2023-08-09 DIAGNOSIS — I447 Left bundle-branch block, unspecified: Secondary | ICD-10-CM | POA: Diagnosis not present

## 2023-08-09 DIAGNOSIS — G4733 Obstructive sleep apnea (adult) (pediatric): Secondary | ICD-10-CM | POA: Diagnosis not present

## 2023-08-09 DIAGNOSIS — E1122 Type 2 diabetes mellitus with diabetic chronic kidney disease: Secondary | ICD-10-CM | POA: Diagnosis not present

## 2023-08-09 DIAGNOSIS — I2510 Atherosclerotic heart disease of native coronary artery without angina pectoris: Secondary | ICD-10-CM | POA: Insufficient documentation

## 2023-08-09 DIAGNOSIS — Z951 Presence of aortocoronary bypass graft: Secondary | ICD-10-CM | POA: Insufficient documentation

## 2023-08-09 DIAGNOSIS — N183 Chronic kidney disease, stage 3 unspecified: Secondary | ICD-10-CM | POA: Diagnosis not present

## 2023-08-09 DIAGNOSIS — I25119 Atherosclerotic heart disease of native coronary artery with unspecified angina pectoris: Secondary | ICD-10-CM

## 2023-08-09 DIAGNOSIS — I272 Pulmonary hypertension, unspecified: Secondary | ICD-10-CM | POA: Insufficient documentation

## 2023-08-09 DIAGNOSIS — Z79899 Other long term (current) drug therapy: Secondary | ICD-10-CM | POA: Insufficient documentation

## 2023-08-09 DIAGNOSIS — I5022 Chronic systolic (congestive) heart failure: Secondary | ICD-10-CM | POA: Diagnosis not present

## 2023-08-09 DIAGNOSIS — Z7985 Long-term (current) use of injectable non-insulin antidiabetic drugs: Secondary | ICD-10-CM | POA: Diagnosis not present

## 2023-08-09 DIAGNOSIS — I130 Hypertensive heart and chronic kidney disease with heart failure and stage 1 through stage 4 chronic kidney disease, or unspecified chronic kidney disease: Secondary | ICD-10-CM | POA: Diagnosis not present

## 2023-08-09 DIAGNOSIS — I428 Other cardiomyopathies: Secondary | ICD-10-CM | POA: Insufficient documentation

## 2023-08-09 DIAGNOSIS — Z7984 Long term (current) use of oral hypoglycemic drugs: Secondary | ICD-10-CM | POA: Insufficient documentation

## 2023-08-09 DIAGNOSIS — Z87891 Personal history of nicotine dependence: Secondary | ICD-10-CM | POA: Diagnosis not present

## 2023-08-09 DIAGNOSIS — G629 Polyneuropathy, unspecified: Secondary | ICD-10-CM

## 2023-08-09 LAB — BASIC METABOLIC PANEL
Anion gap: 9 (ref 5–15)
BUN: 24 mg/dL — ABNORMAL HIGH (ref 8–23)
CO2: 32 mmol/L (ref 22–32)
Calcium: 9.3 mg/dL (ref 8.9–10.3)
Chloride: 102 mmol/L (ref 98–111)
Creatinine, Ser: 1.15 mg/dL (ref 0.61–1.24)
GFR, Estimated: >60 mL/min (ref >60)
Glucose, Bld: 142 mg/dL — ABNORMAL HIGH (ref 70–99)
Potassium: 3.9 mmol/L (ref 3.5–5.1)
Sodium: 143 mmol/L (ref 135–145)

## 2023-08-09 NOTE — Patient Instructions (Addendum)
 Thank you for coming in today  If you had labs drawn today, any labs that are abnormal the clinic will call you No news is good news  Medications: No changes  Follow up appointments:  Your physician recommends that you schedule a follow-up appointment in:  4 months With Dr. Gala Romney  You will receive a reminder letter in the mail a few months in advance. If you don't receive a letter, please call our office to schedule the follow-up appointment.    Do the following things EVERYDAY: Weigh yourself in the morning before breakfast. Write it down and keep it in a log. Take your medicines as prescribed Eat low salt foods--Limit salt (sodium) to 2000 mg per day.  Stay as active as you can everyday Limit all fluids for the day to less than 2 liters   At the [facility], you and your health needs are our priority. As part of our continuing mission to provide you with exceptional heart care, we have created designated Provider Care Teams. These Care Teams include your primary Cardiologist (physician) and Advanced Practice Providers (APPs- Physician Assistants and Nurse Practitioners) who all work together to provide you with the care you need, when you need it.   You may see any of the following providers on your designated Care Team at your next follow up: Dr Arvilla Meres Dr Marca Ancona Dr. Marcos Eke, NP Robbie Lis, [place] [facility] [place], [place] Brynda Peon, NP Karle Plumber, PharmD   Please be sure to bring in all your medications bottles to every appointment.    Thank you for choosing [facility]  If you have any questions or concerns before your next appointment please send [place] a message through [place] or call our office at [phone].    TO LEAVE A MESSAGE FOR THE NURSE SELECT OPTION 2, PLEASE LEAVE A MESSAGE INCLUDING: YOUR NAME DATE OF BIRTH CALL BACK NUMBER REASON FOR  CALL**this is important as we prioritize the call backs  YOU WILL RECEIVE A CALL BACK THE SAME DAY AS LONG AS YOU CALL BEFORE 4:00 PM

## 2023-08-19 ENCOUNTER — Other Ambulatory Visit: Payer: Self-pay | Admitting: Diagnostic Radiology

## 2023-08-19 DIAGNOSIS — Z01818 Encounter for other preprocedural examination: Secondary | ICD-10-CM

## 2023-08-20 ENCOUNTER — Ambulatory Visit (HOSPITAL_COMMUNITY)
Admission: RE | Admit: 2023-08-20 | Discharge: 2023-08-20 | Disposition: A | Discharge status: Home / Self Care | Payer: Medicare Other | Source: Ambulatory Visit | Attending: Gastroenterology | Admitting: Gastroenterology

## 2023-08-20 DIAGNOSIS — K7460 Unspecified cirrhosis of liver: Secondary | ICD-10-CM | POA: Diagnosis present

## 2023-08-20 DIAGNOSIS — Z01818 Encounter for other preprocedural examination: Secondary | ICD-10-CM | POA: Diagnosis present

## 2023-08-20 DIAGNOSIS — R768 Other specified abnormal immunological findings in serum: Secondary | ICD-10-CM | POA: Diagnosis present

## 2023-08-20 DIAGNOSIS — R748 Abnormal levels of other serum enzymes: Secondary | ICD-10-CM | POA: Insufficient documentation

## 2023-08-20 LAB — CBC
HCT: 46.5 % (ref 39.0–52.0)
Hemoglobin: 14.3 g/dL (ref 13.0–17.0)
MCH: 28.9 pg (ref 26.0–34.0)
MCHC: 30.8 g/dL (ref 30.0–36.0)
MCV: 93.9 fL (ref 80.0–100.0)
Platelets: 153 10*3/uL (ref 150–400)
RBC: 4.95 MIL/uL (ref 4.22–5.81)
RDW: 15.7 % — ABNORMAL HIGH (ref 11.5–15.5)
WBC: 6 10*3/uL (ref 4.0–10.5)
nRBC: 0 % (ref 0.0–0.2)

## 2023-08-20 LAB — PROTIME-INR
INR: 1.2 (ref 0.8–1.2)
Prothrombin Time: 15.8 s — ABNORMAL HIGH (ref 11.4–15.2)

## 2023-08-20 NOTE — Progress Notes (Addendum)
Patient will be rescheduled due to taking aspirin  08/19/23.   Patient instructed to hold aspirin x 5 days and eliquis 2 days. Radiology to call and reschedule.

## 2023-08-24 ENCOUNTER — Other Ambulatory Visit (INDEPENDENT_AMBULATORY_CARE_PROVIDER_SITE_OTHER): Payer: Self-pay | Admitting: Gastroenterology

## 2023-08-24 DIAGNOSIS — R748 Abnormal levels of other serum enzymes: Secondary | ICD-10-CM

## 2023-08-24 DIAGNOSIS — R768 Other specified abnormal immunological findings in serum: Secondary | ICD-10-CM

## 2023-08-24 DIAGNOSIS — K7460 Unspecified cirrhosis of liver: Secondary | ICD-10-CM

## 2023-09-01 ENCOUNTER — Ambulatory Visit (HOSPITAL_COMMUNITY)
Admission: RE | Admit: 2023-09-01 | Discharge: 2023-09-01 | Disposition: A | Discharge status: Home / Self Care | Payer: Medicare Other | Source: Ambulatory Visit | Attending: Gastroenterology | Admitting: Gastroenterology

## 2023-09-01 DIAGNOSIS — R768 Other specified abnormal immunological findings in serum: Secondary | ICD-10-CM | POA: Diagnosis present

## 2023-09-01 DIAGNOSIS — R748 Abnormal levels of other serum enzymes: Secondary | ICD-10-CM | POA: Insufficient documentation

## 2023-09-01 DIAGNOSIS — K7460 Unspecified cirrhosis of liver: Secondary | ICD-10-CM | POA: Diagnosis present

## 2023-09-02 LAB — COMPREHENSIVE METABOLIC PANEL
ALT: 129 IU/L — ABNORMAL HIGH (ref 0–44)
AST: 80 IU/L — ABNORMAL HIGH (ref 0–40)
Albumin: 4.1 g/dL (ref 3.8–4.8)
Alkaline Phosphatase: 105 IU/L (ref 44–121)
BUN/Creatinine Ratio: 22 (ref 10–24)
BUN: 29 mg/dL — ABNORMAL HIGH (ref 8–27)
Bilirubin Total: 0.7 mg/dL (ref 0.0–1.2)
CO2: 32 mmol/L — ABNORMAL HIGH (ref 20–29)
Calcium: 9.4 mg/dL (ref 8.6–10.2)
Chloride: 102 mmol/L (ref 96–106)
Creatinine, Ser: 1.31 mg/dL — ABNORMAL HIGH (ref 0.76–1.27)
Globulin, Total: 2.1 g/dL (ref 1.5–4.5)
Glucose: 89 mg/dL (ref 70–99)
Potassium: 4.3 mmol/L (ref 3.5–5.2)
Sodium: 149 mmol/L — ABNORMAL HIGH (ref 134–144)
Total Protein: 6.2 g/dL (ref 6.0–8.5)
eGFR: 57 mL/min/{1.73_m2} — ABNORMAL LOW (ref >59)

## 2023-09-02 LAB — CBC
Hematocrit: 42.7 % (ref 37.5–51.0)
Hemoglobin: 13.7 g/dL (ref 13.0–17.7)
MCH: 28.7 pg (ref 26.6–33.0)
MCHC: 32.1 g/dL (ref 31.5–35.7)
MCV: 90 fL (ref 79–97)
Platelets: 131 10*3/uL — ABNORMAL LOW (ref 150–450)
RBC: 4.77 x10E6/uL (ref 4.14–5.80)
RDW: 14.7 % (ref 11.6–15.4)
WBC: 4.6 10*3/uL (ref 3.4–10.8)

## 2023-09-02 LAB — PROTIME-INR
INR: 1.1 (ref 0.9–1.2)
Prothrombin Time: 12.1 s — ABNORMAL HIGH (ref 9.1–12.0)

## 2023-09-02 LAB — IGM: IgM (Immunoglobulin M), Srm: 55 mg/dL (ref 15–143)

## 2023-09-02 LAB — AFP TUMOR MARKER: AFP, Serum, Tumor Marker: 3.9 ng/mL (ref 0.0–8.4)

## 2023-09-06 ENCOUNTER — Encounter (INDEPENDENT_AMBULATORY_CARE_PROVIDER_SITE_OTHER): Payer: Self-pay | Admitting: Gastroenterology

## 2023-09-06 NOTE — Progress Notes (Signed)
 Labs reviewed;  INR 1.1 Creatinine slightly elevated 1.31 Liver enzymes continue to be elevated : ALT:129 AST: 80 PLT:131 AFP : 3.9  Normal IgM  Pending ultrasound and liver biopsy

## 2023-09-07 ENCOUNTER — Encounter (INDEPENDENT_AMBULATORY_CARE_PROVIDER_SITE_OTHER): Payer: Self-pay | Admitting: Gastroenterology

## 2023-09-07 NOTE — Progress Notes (Signed)
 Ultrasound Abdomen :  IMPRESSION: 1. No acute process. 2. There is a 5 mm gallbladder polyp. No imaging follow-up needed. 3. Bilateral renal cysts. Complicated cyst on the left. Follow-up renal ultrasound in 6 months is recommended. 4. Probable bilateral renal stones.  ================  Hi Ann   Can you please send this ultrasound report to pcp for follow up on renal cyst ?

## 2023-09-07 NOTE — Progress Notes (Signed)
 Patient risk factor is Age >60  and size 5mm, recommendation is surveillance at 1,3,5 years , if asymptomatic

## 2023-09-10 NOTE — Progress Notes (Signed)
 Remote ICD transmission.

## 2023-09-14 ENCOUNTER — Other Ambulatory Visit: Payer: Self-pay | Admitting: Interventional Radiology

## 2023-09-14 DIAGNOSIS — Z01818 Encounter for other preprocedural examination: Secondary | ICD-10-CM

## 2023-09-15 ENCOUNTER — Ambulatory Visit (HOSPITAL_COMMUNITY)
Admission: RE | Admit: 2023-09-15 | Discharge: 2023-09-15 | Disposition: A | Discharge status: Home / Self Care | Payer: Medicare Other | Source: Ambulatory Visit | Attending: Gastroenterology | Admitting: Gastroenterology

## 2023-09-15 ENCOUNTER — Other Ambulatory Visit: Payer: Self-pay

## 2023-09-15 DIAGNOSIS — Z7982 Long term (current) use of aspirin: Secondary | ICD-10-CM | POA: Insufficient documentation

## 2023-09-15 DIAGNOSIS — Z01818 Encounter for other preprocedural examination: Secondary | ICD-10-CM

## 2023-09-15 DIAGNOSIS — R768 Other specified abnormal immunological findings in serum: Secondary | ICD-10-CM

## 2023-09-15 DIAGNOSIS — R748 Abnormal levels of other serum enzymes: Secondary | ICD-10-CM | POA: Diagnosis present

## 2023-09-15 DIAGNOSIS — Z7901 Long term (current) use of anticoagulants: Secondary | ICD-10-CM | POA: Diagnosis not present

## 2023-09-15 DIAGNOSIS — I5022 Chronic systolic (congestive) heart failure: Secondary | ICD-10-CM | POA: Diagnosis not present

## 2023-09-15 DIAGNOSIS — Z9981 Dependence on supplemental oxygen: Secondary | ICD-10-CM | POA: Insufficient documentation

## 2023-09-15 DIAGNOSIS — Z8673 Personal history of transient ischemic attack (TIA), and cerebral infarction without residual deficits: Secondary | ICD-10-CM | POA: Insufficient documentation

## 2023-09-15 DIAGNOSIS — I4891 Unspecified atrial fibrillation: Secondary | ICD-10-CM | POA: Diagnosis not present

## 2023-09-15 DIAGNOSIS — E1122 Type 2 diabetes mellitus with diabetic chronic kidney disease: Secondary | ICD-10-CM | POA: Diagnosis not present

## 2023-09-15 DIAGNOSIS — K7460 Unspecified cirrhosis of liver: Secondary | ICD-10-CM

## 2023-09-15 DIAGNOSIS — G4733 Obstructive sleep apnea (adult) (pediatric): Secondary | ICD-10-CM | POA: Diagnosis not present

## 2023-09-15 DIAGNOSIS — I130 Hypertensive heart and chronic kidney disease with heart failure and stage 1 through stage 4 chronic kidney disease, or unspecified chronic kidney disease: Secondary | ICD-10-CM | POA: Diagnosis not present

## 2023-09-15 LAB — CBC
HCT: 45.1 % (ref 39.0–52.0)
Hemoglobin: 13.8 g/dL (ref 13.0–17.0)
MCH: 28.6 pg (ref 26.0–34.0)
MCHC: 30.6 g/dL (ref 30.0–36.0)
MCV: 93.6 fL (ref 80.0–100.0)
Platelets: 135 10*3/uL — ABNORMAL LOW (ref 150–400)
RBC: 4.82 MIL/uL (ref 4.22–5.81)
RDW: 15.6 % — ABNORMAL HIGH (ref 11.5–15.5)
WBC: 5.2 10*3/uL (ref 4.0–10.5)
nRBC: 0 % (ref 0.0–0.2)

## 2023-09-15 LAB — GLUCOSE, CAPILLARY
Glucose-Capillary: 94 mg/dL (ref 70–99)
Glucose-Capillary: 93 mg/dL (ref 70–99)

## 2023-09-15 LAB — PROTIME-INR
INR: 1 (ref 0.8–1.2)
Prothrombin Time: 13.8 s (ref 11.4–15.2)

## 2023-09-15 MED ORDER — LIDOCAINE HCL (PF) 1 % IJ SOLN
10.0000 mL | Freq: Once | INTRAMUSCULAR | Status: AC
  Administered 2023-09-15: 10 mL via INTRADERMAL

## 2023-09-15 MED ORDER — FENTANYL CITRATE (PF) 100 MCG/2ML IJ SOLN
INTRAMUSCULAR | Status: AC | PRN
  Administered 2023-09-15 (×2): 25 ug via INTRAVENOUS

## 2023-09-15 MED ORDER — MIDAZOLAM HCL 2 MG/2ML IJ SOLN
INTRAMUSCULAR | Status: AC | PRN
  Administered 2023-09-15 (×2): .5 mg via INTRAVENOUS

## 2023-09-15 MED ORDER — SODIUM CHLORIDE 0.9 % IV SOLN
INTRAVENOUS | Status: AC | PRN
  Administered 2023-09-15: 10 mL/h via INTRAVENOUS

## 2023-09-15 MED ORDER — GELATIN ABSORBABLE 12-7 MM EX MISC
1.0000 | Freq: Once | CUTANEOUS | Status: AC
  Administered 2023-09-15: 1 via TOPICAL

## 2023-09-15 MED ORDER — MIDAZOLAM HCL 2 MG/2ML IJ SOLN
INTRAMUSCULAR | Status: AC
  Filled 2023-09-15: qty 2

## 2023-09-15 MED ORDER — FENTANYL CITRATE (PF) 100 MCG/2ML IJ SOLN
INTRAMUSCULAR | Status: AC
  Filled 2023-09-15: qty 2

## 2023-09-15 NOTE — Progress Notes (Signed)
 Spoke with Alwyn Ren PA re: when patient can resume eliquis and Aspirin and she stated Friday.

## 2023-09-15 NOTE — H&P (Signed)
 Chief Complaint: Patient was seen in consultation today for elevated liver enzymes  Referring Physician(s): Ahmed,Muhammad F  Supervising Physician: Dr. Fredia Sorrow  Patient Status: [facility] - Out-pt  History of Present Illness: Tommy Riley is a 78 y.o. male with a medical history significant for anxiety, depression, DM2, stroke, HTN, COPD (on home oxygen), OSA, CHF, chronic kidney disease and atrial fibrillation s/p ablation in 2011. He is on Eliquis and Aspirin.   He was recently evaluated by GI for elevated liver enzymes and image findings concerning for cirrhosis. He has been referred to Interventional Radiology for a non-focal liver biopsy for further work up.   Past Medical History:  Diagnosis Date   Anxiety    Atrial fibrillation and flutter (HCC)    Atrial fibrillation ablation 2011 at [facility]   CAD in native artery    Chronic systolic heart failure (HCC)    CKD (chronic kidney disease), stage III (HCC)    COPD (chronic obstructive pulmonary disease) (HCC)    COVID-22 February 2020   Depression    Essential hypertension    Gout    Hyperlipidemia    Ischemic heart disease    OSA (obstructive sleep apnea)    Stroke (HCC) 2003   Type 2 diabetes mellitus (HCC)     Past Surgical History:  Procedure Laterality Date   ATRIAL FIBRILLATION ABLATION  2011   [facility]   AV NODE ABLATION N/A 05/07/2021   Procedure: AV NODE ABLATION;  Surgeon: Marinus Maw, MD;  Location: [facility] INVASIVE CV LAB;  Service: Cardiovascular;  Laterality: N/A;   AV NODE ABLATION N/A 09/01/2021   Procedure: AV NODE ABLATION;  Surgeon: Marinus Maw, MD;  Location: [facility] INVASIVE CV LAB;  Service: Cardiovascular;  Laterality: N/A;   CATARACT EXTRACTION Bilateral    CORONARY ARTERY BYPASS GRAFT  2001   HERNIA REPAIR Right    ICD IMPLANT N/A 05/07/2021   Procedure: ICD IMPLANT;  Surgeon: Marinus Maw, MD;  Location: [facility] INVASIVE CV LAB;  Service: Cardiovascular;  Laterality: N/A;   RIGHT/LEFT HEART CATH  AND CORONARY/GRAFT ANGIOGRAPHY N/A 05/05/2021   Procedure: RIGHT/LEFT HEART CATH AND CORONARY/GRAFT ANGIOGRAPHY;  Surgeon: Runell Gess, MD;  Location: [facility] INVASIVE CV LAB;  Service: Cardiovascular;  Laterality: N/A;   TONSILLECTOMY AND ADENOIDECTOMY      Allergies: Codeine  Medications: Prior to Admission medications   Medication Sig Start Date End Date Taking? Authorizing Provider  albuterol (PROVENTIL) (2.5 MG/3ML) 0.083% nebulizer solution Take 3 mLs (2.5 mg total) by nebulization every 6 (six) hours as needed for wheezing or shortness of breath. 08/26/21  Yes Glenford Bayley, NP  albuterol (VENTOLIN HFA) 108 (90 Base) MCG/ACT inhaler USE 2 INHALATIONS BY MOUTH  EVERY 4 HOURS AS NEEDED FOR SHORTNESS OF BREATH OR  WHEEZE 08/06/23  Yes Cobb, Ruby Cola, NP  allopurinol (ZYLOPRIM) 100 MG tablet Taking two tablets by mouth in the morning and 1 tablet by mouth in the evening   Yes [provider]  Ascorbic Acid (VITAMIN C PO) Take 2,000 mg by mouth daily.   Yes [provider]  Cholecalciferol (VITAMIN D3) 2000 UNITS TABS Take 1 capsule by mouth in the morning and at bedtime.   Yes [provider]  Continuous Glucose Sensor (DEXCOM G7 SENSOR) MISC See admin instructions. 06/21/23  Yes [provider]  dapagliflozin propanediol (FARXIGA) 10 MG TABS tablet Take 1 tablet (10 mg total) by mouth daily. 03/19/23  Yes Jonelle Sidle, MD  diphenhydramine-acetaminophen (TYLENOL PM) 25-500 MG TABS tablet Take 2 tablets by mouth at bedtime.   Yes [provider]  ezetimibe (ZETIA) 10 MG tablet Take 10 mg by mouth in the morning.   Yes [provider]  fluticasone (FLONASE) 50 MCG/ACT nasal spray Place 1 spray into both nostrils daily. 01/11/23  Yes Coralyn Helling, MD  Fluticasone-Umeclidin-Vilant (TRELEGY ELLIPTA) 100-62.5-25 MCG/ACT AEPB Inhale 1 puff into the lungs daily. 08/06/23  Yes Cobb, Ruby Cola, NP  folic acid (FOLVITE) 400 MCG tablet  Take 400 mcg by mouth every evening.   Yes [provider]  HUMALOG KWIKPEN 200 UNIT/ML KwikPen 20 Units as needed. On a sliding scale 09/12/21  Yes [provider]  insulin degludec (TRESIBA) 100 UNIT/ML FlexTouch Pen Inject 100 Units into the skin 2 (two) times daily.   Yes [provider]  losartan (COZAAR) 50 MG tablet TAKE 1 TABLET BY MOUTH DAILY 07/23/23  Yes Jonelle Sidle, MD  melatonin 5 MG TABS Take 5 mg by mouth at bedtime.   Yes [provider]  metoprolol (TOPROL-XL) 200 MG 24 hr tablet TAKE 1 TABLET BY MOUTH DAILY  WITH OR IMMEDIATELY FOLLOWING A  MEAL 08/02/23  Yes Jonelle Sidle, MD  Multiple Vitamins-Minerals (CENTRUM CARDIO) TABS Take 1 tablet by mouth in the morning.   Yes [provider]  omega-3 acid ethyl esters (LOVAZA) 1 g capsule Take 2 g by mouth 2 (two) times daily.   Yes [provider]  OZEMPIC, 0.25 OR 0.5 MG/DOSE, 2 MG/3ML SOPN Inject 0.5 Units into the skin once a week. On Fridays 10/16/22  Yes [provider]  potassium chloride SA (KLOR-CON M) 20 MEQ tablet TAKE 3 TABLETS BY MOUTH 3 TIMES  DAILY 06/01/23  Yes Jonelle Sidle, MD  rosuvastatin (CRESTOR) 40 MG tablet TAKE 1 TABLET BY MOUTH DAILY 03/30/23  Yes Jonelle Sidle, MD  sertraline (ZOLOFT) 100 MG tablet Take 150 mg by mouth in the morning. 08/04/21  Yes [provider]  spironolactone (ALDACTONE) 25 MG tablet TAKE 1 TABLET BY MOUTH DAILY 03/30/23  Yes Jonelle Sidle, MD  torsemide (DEMADEX) 20 MG tablet TAKE 4 TABLETS BY MOUTH TWICE  DAILY MAY TAKE AN ADDITIONAL 1  TABLET BY MOUTH DAILY AS NEEDED  FOR 2 TO 3 LBS WEIGHT GAIN IN 1  DAY OR 5 LBS IN 1 WEEK 03/30/23  Yes Jonelle Sidle, MD  zinc sulfate 220 (50 Zn) MG capsule Take 220 mg by mouth every evening.   Yes [provider]  ACCU-CHEK AVIVA PLUS test strip  07/09/14   [provider]  apixaban (ELIQUIS) 5 MG TABS tablet Take 1 tablet (5 mg total) by mouth  2 (two) times daily. 07/22/23   Jonelle Sidle, MD  aspirin 81 MG EC tablet Take 81 mg by mouth in the morning.    [provider]  Blood Glucose Monitoring Suppl (ONE TOUCH ULTRA 2) W/DEVICE KIT by Does not apply route.    [provider]  insulin detemir (LEVEMIR) 100 UNIT/ML injection Inject 50 Units into the skin 2 (two) times daily.    [provider]  loratadine (CLARITIN) 10 MG tablet Take 10 mg by mouth in the morning.    [provider]  oxymetazoline (AFRIN) 0.05 % nasal spray Place 1 spray into both nostrils as needed for congestion.    [provider]  pantoprazole (PROTONIX) 40 MG tablet Take 1 tablet (40 mg total) by mouth daily  before breakfast. 06/14/23   Ahmed, Juanetta Beets, MD  RELION PEN NEEDLE 31G/8MM 31G X 8 MM MISC 3 (three) times daily. 08/31/21   [provider]     Family History  Problem Relation Age of Onset   Raynaud syndrome Mother    Stroke Father    Diabetes Mellitus II Father    Hypertension Father    Hypertension Sister    Diabetes Mellitus II Sister    Stroke Brother    Crohn's disease Brother    Breast cancer Brother    COPD Brother     Social History   Socioeconomic History   Marital status: Married    Spouse name: Tommy Nannini   Number of children: 3   Years of education: 14   Highest education level: Some college, no degree  Occupational History    Comment: retired  Tobacco Use   Smoking status: Former    Current packs/day: 0.00    Average packs/day: 2.5 packs/day for 50.0 years (125.0 ttl pk-yrs)    Types: Cigarettes    Start date: 06/16/1961    Quit date: 06/17/2011    Years since quitting: 12.2    Passive exposure: Past   Smokeless tobacco: Never  Vaping Use   Vaping status: Former   Start date: 07/06/2017   Quit date: 04/19/2021   Substances: Flavoring  Substance and Sexual Activity   Alcohol use: Yes    Comment: occasional beer less than once a month   Drug use: No    Sexual activity: Not on file  Other Topics Concern   Not on file  Social History Narrative   Consumes no caffeine   Social Drivers of Corporate investment banker Strain: Low Risk  (05/01/2021)   Overall Financial Resource Strain (CARDIA)    Difficulty of Paying Living Expenses: Not hard at all  Food Insecurity: No Food Insecurity (05/01/2021)   Hunger Vital Sign    Worried About Running Out of Food in the Last Year: Never true    Ran Out of Food in the Last Year: Never true  Transportation Needs: No Transportation Needs (05/01/2021)   PRAPARE - Administrator, Civil Service (Medical): No    Lack of Transportation (Non-Medical): No  Physical Activity: Not on file  Stress: Not on file  Social Connections: Not on file    Review of Systems: A 12 point ROS discussed and pertinent positives are indicated in the HPI above.  All other systems are negative.  Review of Systems  Constitutional:  Negative for appetite change and fatigue.  Respiratory:  Positive for shortness of breath. Negative for cough.   Gastrointestinal:  Negative for abdominal pain, diarrhea, nausea and vomiting.  Neurological:  Negative for dizziness and headaches.    Vital Signs: BP (!) 152/82   Pulse 68   Temp 98.2 F (36.8 C) (Oral)   Resp 18   Ht 5' 11.5" (1.816 m)   Wt 262 lb (118.8 kg)   SpO2 100%   BMI 36.03 kg/m   Physical Exam Constitutional:      General: He is not in acute distress.    Appearance: He is obese. He is not ill-appearing.  HENT:     Mouth/Throat:     Mouth: Mucous membranes are moist.     Pharynx: Oropharynx is clear.  Cardiovascular:     Rate and Rhythm: Normal rate.  Pulmonary:     Effort: Pulmonary effort is normal.  Abdominal:  Tenderness: There is no abdominal tenderness.  Skin:    General: Skin is warm and dry.     Findings: Bruising present.     Comments: Scattered/generalized bruising  Neurological:     Mental Status: He is alert and oriented to  person, place, and time.  Psychiatric:        Mood and Affect: Mood normal.        Behavior: Behavior normal.        Thought Content: Thought content normal.        Judgment: Judgment normal.     Imaging: US Abdomen Complete Result Date: 09/06/2023 CLINICAL DATA:  Cirrhosis EXAM: ABDOMEN ULTRASOUND COMPLETE COMPARISON:  None Available. FINDINGS: Gallbladder: There is a 5 mm gallbladder polyp. No gallbladder wall thickening or pericholecystic fluid. Negative sonographic Murphy's sign. Common bile duct: Diameter: 4 mm Liver: No focal lesion identified. Within normal limits in parenchymal echogenicity. Portal vein is patent on color Doppler imaging with normal direction of blood flow towards the liver. IVC: No abnormality visualized. Pancreas: Visualized portion unremarkable. Spleen: Size and appearance within normal limits. Right Kidney: Length: 12.3 cm. Normal renal cortical thickness and echogenicity. 7 mm stone. There is a 1.4 cm cyst. Left Kidney: Length: 14.4 cm. Normal renal cortical thickness and echogenicity. No hydronephrosis. There is a 1.1 cm complicated cyst. 13 mm echogenic focus, artifact versus stone. Abdominal aorta: No aneurysm visualized. Other findings: None. IMPRESSION: 1. No acute process. 2. There is a 5 mm gallbladder polyp. No imaging follow-up needed. 3. Bilateral renal cysts. Complicated cyst on the left. Follow-up renal ultrasound in 6 months is recommended. 4. Probable bilateral renal stones. Electronically Signed   By: Annia Belt M.D.   On: 09/06/2023 13:04    Labs:  CBC: Recent Labs    08/20/23 1136 09/01/23 1058 09/15/23 1126  WBC 6.0 4.6 5.2  HGB 14.3 13.7 13.8  HCT 46.5 42.7 45.1  PLT 153 131* 135*    COAGS: Recent Labs    06/14/23 1630 08/20/23 1136 09/01/23 1058 09/15/23 1126  INR 1.1 1.2 1.1 1.0    BMP: Recent Labs    09/18/22 1402 06/14/23 1630 08/09/23 1412 09/01/23 1058  NA 146* 144 143 149*  K 4.5 4.4 3.9 4.3  CL 102 98 102 102   CO2 26 28 32 32*  GLUCOSE 147* 178* 142* 89  BUN 23 32* 24* 29*  CALCIUM 9.3 9.1 9.3 9.4  CREATININE 1.27 1.23 1.15 1.31*  GFRNONAA  --   --  >60  --     LIVER FUNCTION TESTS: Recent Labs    06/14/23 1630 09/01/23 1058  BILITOT 0.6 0.7  AST 75* 80*  ALT 83* 129*  ALKPHOS 121 105  PROT 6.2 6.2  ALBUMIN 3.9 4.1    TUMOR MARKERS: No results for input(s): "AFPTM", "CEA", "CA199", "CHROMGRNA" in the last 8760 hours.  Assessment and Plan:  Elevated liver enzymes: Tommy Riley, 78 year old male, presents today to the [facility] Interventional Radiology department for an image-guided non-focal liver biopsy.  Risks and benefits of this procedure were discussed with the patient and/or patient's family including, but not limited to bleeding, infection, damage to adjacent structures or low yield requiring additional tests.  All of the questions were answered and there is agreement to proceed. He has been NPO. He is a full code.  His last dose of Eliquis was Sunday 09/12/23 and his last dose of Aspirin was Thursday 09/09/23.   Consent signed and in chart.  Thank you for this interesting consult.  I greatly enjoyed meeting Tommy Riley and look forward to participating in their care.  A copy of this report was sent to the requesting provider on this date.  Electronically Signed: Alwyn Ren, AGACNP-BC 09/15/2023, 12:00 PM   I spent a total of  30 Minutes   in face to face in clinical consultation, greater than 50% of which was counseling/coordinating care for elevated liver enzymes.

## 2023-09-15 NOTE — Procedures (Signed)
 Interventional Radiology Procedure Note  Procedure: US Guided Biopsy of liver  Complications: None  Estimated Blood Loss: < 10 mL  Findings: 18 G core biopsy of liver performed under US guidance.  Three core samples obtained and sent to Pathology.  Jodi Marble. Fredia Sorrow, M.D Pager:  [phone]

## 2023-09-16 LAB — SURGICAL PATHOLOGY

## 2023-09-16 NOTE — Sedation Documentation (Signed)
 Dr Fredia Sorrow did patient ASA and Airway, ASA 2 Airway 2

## 2023-09-20 ENCOUNTER — Encounter (INDEPENDENT_AMBULATORY_CARE_PROVIDER_SITE_OTHER): Payer: Self-pay | Admitting: Gastroenterology

## 2023-09-20 NOTE — Progress Notes (Signed)
 Liver biopsy : FINAL MICROSCOPIC DIAGNOSIS:  A. LIVER, RIGHT LOBE, NEEDLE CORE BIOPSY:      Mild portal inflammation with histiocytes.      No evidence of advanced fibrosis.      See comment.  COMMENT:  The biopsy contains three cores of hepatic parenchyma with preserved lobular architecture. The portal tracts contain minimal inflammation composed of lymphocytes and histiocytes. There is no evidence of bile duct injury, florid ductal lesion or ductopenia. No interface activity identified. The lobular parenchyma does not show evidence of steatosis, cholestasis, or hepatocytes injury. Trichrome stain does not show evidence of advanced fibrosis. Iron stain is negative for iron deposit. PAS/D stain highlight ceroid laden macrophages and is negative for cytoplasmic globules. Reticulin stain shows an intact reticulin framework.  The overall findings are non-specific. The presence of portal histiocytes is indicative of resolved liver injury. The etiologic considerations include infection, medication effect, supplement toxin, drug induced live injury, metabolic syndrome, Wilson disease, et al. Correlation with clinical information is recommended.  ============= ultrasound with possible cirrhosis and splenomegaly.  Given thrombocytopenia patient likely has compensated cirrhosis even though biopsy did not show cirrhosis

## 2023-09-21 IMAGING — DX DG CHEST 2V
2 series · 2 of 2 positions shown · non-contrast
Comparison: Radiograph 05/19/2021.  Chest CT 04/23/2020

CLINICAL DATA: Shortness of breath.  Extremity swelling.

EXAM:
CHEST - 2 VIEW

[chest lat]
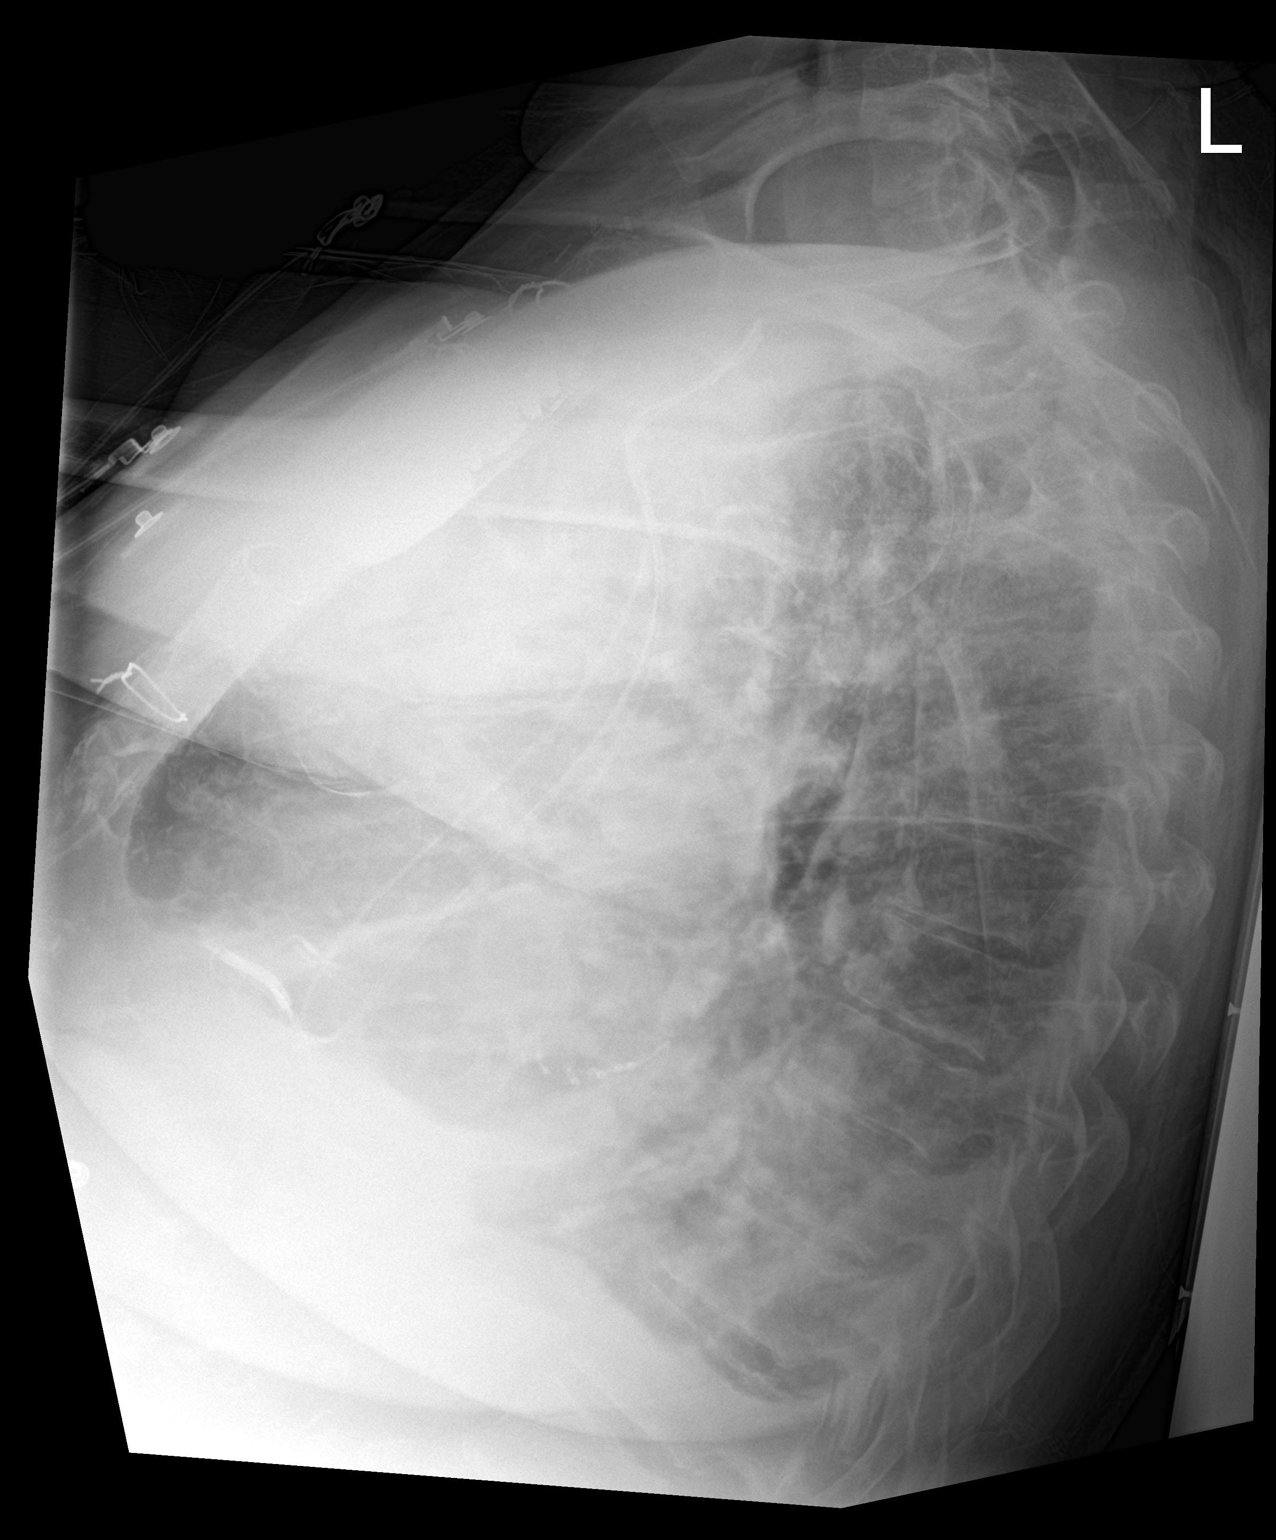

[chest ap]
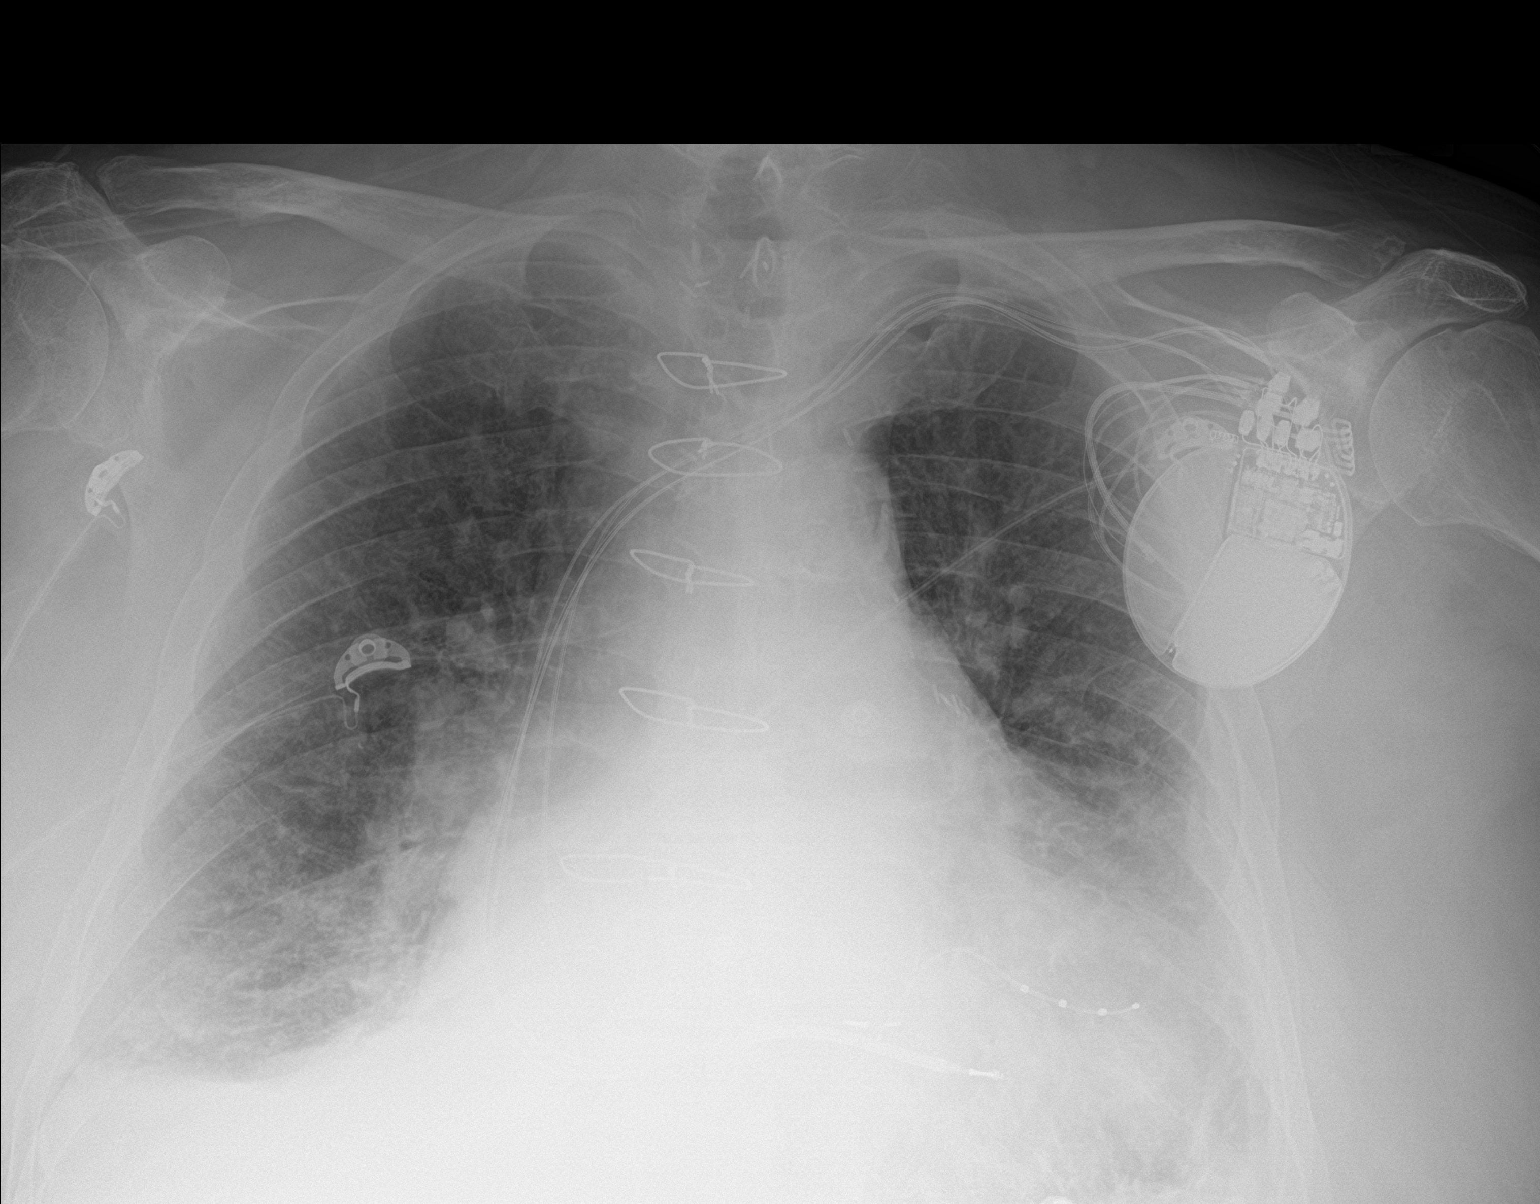

[2 of 2 positions shown; findings below may reference images not displayed]

FINDINGS: Cardiomegaly with questionable increase from prior exam. Post median
sternotomy and CABG. Left-sided pacemaker in place. There are small
bilateral pleural effusions. Interstitial and septal thickening
consistent with pulmonary edema. No pneumothorax. No confluent
consolidation. No acute osseous abnormalities are seen.
IMPRESSION: Findings consistent with CHF. Cardiomegaly with pulmonary edema and
small pleural effusions.

## 2023-09-23 ENCOUNTER — Encounter: Payer: Self-pay | Admitting: Nurse Practitioner

## 2023-09-23 ENCOUNTER — Ambulatory Visit: Payer: Medicare Other | Admitting: Nurse Practitioner

## 2023-09-23 VITALS — BP 124/66 | HR 69 | Ht 71.5 in | Wt 271.0 lb

## 2023-09-23 DIAGNOSIS — J9611 Chronic respiratory failure with hypoxia: Secondary | ICD-10-CM | POA: Diagnosis not present

## 2023-09-23 DIAGNOSIS — I5042 Chronic combined systolic (congestive) and diastolic (congestive) heart failure: Secondary | ICD-10-CM

## 2023-09-23 DIAGNOSIS — G4733 Obstructive sleep apnea (adult) (pediatric): Secondary | ICD-10-CM | POA: Diagnosis not present

## 2023-09-23 DIAGNOSIS — I509 Heart failure, unspecified: Secondary | ICD-10-CM

## 2023-09-23 DIAGNOSIS — J438 Other emphysema: Secondary | ICD-10-CM

## 2023-09-23 DIAGNOSIS — J449 Chronic obstructive pulmonary disease, unspecified: Secondary | ICD-10-CM | POA: Diagnosis not present

## 2023-09-23 DIAGNOSIS — E66812 Obesity, class 2: Secondary | ICD-10-CM

## 2023-09-23 DIAGNOSIS — I4821 Permanent atrial fibrillation: Secondary | ICD-10-CM

## 2023-09-23 NOTE — Progress Notes (Signed)
 @Patient  ID: Tommy Riley, male    DOB: 1945/09/05, 78 y.o.   MRN: [id number]  Chief Complaint  Patient presents with   Follow-up    Doing well overall. Needs new chin strap for CPAP.     Referring provider: Arlina Robes, *  HPI: 78 year old male, former smoker followed for OSA on CPAP, COPD and chronic respiratory failure on supplemental oxygen.  He is a former patient of Dr. Craige Cotta and last seen in office 01/11/2023. Past medical history significant for PAF on chronic AC, anxiety, CKD stage III, CAD status post CABG, systolic CHF, depression, hypertension, gout, HLD, history of CVA, DM type II.  TEST/EVENTS:  04/28/2017 PFT: FVC 64, FEV1 46, ratio 55, TLC 82, DLCO 67 04/24/2020 HRCT chest: Scarring at bases 10/17/2021 PSG: AHI 8.5, SpO2 low 83% 03/18/2022 echo: EF 40 to 45%, severe LA dilation, moderate RA dilation, mild MR, moderate RV systolic dysfunction 04/05/2023 echo: EF 45 to 50%.  Moderate LVH.  RV size and function normal.  LA severely dilated.  Mild MR.  01/11/2023: OV with Dr. Craige Cotta.  Gets winded easily.  Has more trouble in shower.  Activity limited by neuropathy.  Gets sinus congestion and postnasal drip, which sometimes triggers his cough.  Has clear sputum.  Sometimes hard to bring up phlegm.  Not having any wheeze, hemoptysis or fevers.  Uses CPAP nightly.  No issues with pressure or mask fit.  Uses oxygen 24/7.  Continue Trelegy.  Can use Mucinex, albuterol and flutter valve as needed for chest congestion or cough.  Will arrange for new CPAP machine.  Continue supplemental oxygen 3 L/min.  Can try Flonase for sinus congestion and postnasal drip.  Stop sleep aids with acetaminophen in them.  Can try Benadryl as needed given sinus symptoms as well.  09/23/2023: Today-follow-up Patient presents today for follow-up with his wife.  Breathing has been stable.  He has not had any exacerbations requiring steroids or antibiotics.  No hospitalizations.  He was talking to another  doctor about Markus Daft and would like to try this to see if he has any better benefit than he does with Trelegy.  Has an occasional cough with clear phlegm, mostly sinus related.  No issues with wheezing, fevers, chills, hemoptysis.  No leg swelling, orthopnea, PND, chest pain.  He wears 3 L/min supplemental O2.  No increased oxygen requirements or low O2 levels. He wears CPAP nightly.  He is working on weight loss.  Down about 30 pounds.  Not having any issues with pressures, bloating or belching.  No significant leaks that bother him.  Sleep is restful with CPAP.  Energy levels are good during the day.  No issues with drowsy driving, morning headaches or sleep parasomnia/paralysis.  He does need a new chinstrap.  His wife would like a prescription to take to a local medical supply company so they can get this today.  They are planning to travel to [place] to visit their son and she wants to make sure that he has everything he needs before they go.  08/24/2023-09/22/2023 CPAP 14 cmH2O 30/30 days; 100% > 4 hr; average use 9 hours 13 minutes Pressure 95th 14 Leaks 95th 26.3 AHI 0.7  Allergies  Allergen Reactions   Codeine Anaphylaxis, Other (See Comments) and Shortness Of Breath    Tightness in chest  Other Reaction(s): chest pain  Tightness in chest    Other reaction(s): Other (See Comments)  Tightness in chest  Tightness in chest  Tightness in chest  Immunization History  Administered Date(s) Administered   Influenza, High Dose Seasonal PF 08/16/2015, 04/30/2016, 04/21/2017, 05/10/2017, 04/25/2018, 05/03/2018   Influenza,inj,Quad PF,6+ Mos 05/08/2015   Influenza,inj,Quad PF,6-35 Mos 05/07/2019   Influenza,inj,quad, With Preservative 04/30/2016, 04/22/2017, 04/07/2019, 04/05/2020   Influenza-Unspecified 04/14/2014, 04/21/2017, 05/29/2018, 05/24/2023   Moderna Sars-Covid-2 Vaccination 07/29/2019, 08/26/2019, 03/25/2020   Pneumococcal Conjugate-13 06/22/2013, 04/14/2014, 08/16/2015    Pneumococcal Polysaccharide-23 06/13/2013, 03/25/2015   Tdap 12/17/2016, 03/15/2017   Zoster Recombinant(Shingrix) 03/15/2017, 03/29/2017   Zoster, Live 03/04/2011    Past Medical History:  Diagnosis Date   Anxiety    Atrial fibrillation and flutter (HCC)    Atrial fibrillation ablation 2011 [facility]   CAD in native artery    Chronic systolic heart failure (HCC)    CKD (chronic kidney disease), stage III (HCC)    COPD (chronic obstructive pulmonary disease) (HCC)    COVID-22 February 2020   Depression    Essential hypertension    Gout    Hyperlipidemia    Ischemic heart disease    OSA (obstructive sleep apnea)    Stroke (HCC) 2003   Type 2 diabetes mellitus (HCC)     Tobacco History: Social History   Tobacco Use  Smoking Status Former   Current packs/day: 0.00   Average packs/day: 2.5 packs/day for 50.0 years (125.0 ttl pk-yrs)   Types: Cigarettes   Start date: 06/16/1961   Quit date: 06/17/2011   Years since quitting: 12.2   Passive exposure: Past  Smokeless Tobacco Never   Counseling given: Not Answered   Outpatient Medications Prior to Visit  Medication Sig Dispense Refill   ACCU-CHEK AVIVA PLUS test strip      albuterol (PROVENTIL) (2.5 MG/3ML) 0.083% nebulizer solution Take 3 mLs (2.5 mg total) by nebulization every 6 (six) hours as needed for wheezing or shortness of breath. 75 mL 4   albuterol (VENTOLIN HFA) 108 (90 Base) MCG/ACT inhaler USE 2 INHALATIONS BY MOUTH  EVERY 4 HOURS AS NEEDED FOR SHORTNESS OF BREATH OR  WHEEZE 54 g 1   allopurinol (ZYLOPRIM) 100 MG tablet Taking two tablets by mouth in the morning and 1 tablet by mouth in the evening     apixaban (ELIQUIS) 5 MG TABS tablet Take 1 tablet (5 mg total) by mouth 2 (two) times daily. 180 tablet 1   Ascorbic Acid (VITAMIN C PO) Take 2,000 mg by mouth daily.     aspirin 81 MG EC tablet Take 81 mg by mouth in the morning.     Blood Glucose Monitoring Suppl (ONE TOUCH ULTRA 2) W/DEVICE KIT by Does not  apply route.     Cholecalciferol (VITAMIN D3) 2000 UNITS TABS Take 1 capsule by mouth in the morning and at bedtime.     Continuous Glucose Sensor (DEXCOM G7 SENSOR) MISC See admin instructions.     dapagliflozin propanediol (FARXIGA) 10 MG TABS tablet Take 1 tablet (10 mg total) by mouth daily. 90 tablet 3   diphenhydramine-acetaminophen (TYLENOL PM) 25-500 MG TABS tablet Take 2 tablets by mouth at bedtime.     ezetimibe (ZETIA) 10 MG tablet Take 10 mg by mouth in the morning.     fluticasone (FLONASE) 50 MCG/ACT nasal spray Place 1 spray into both nostrils daily. 16 g 2   Fluticasone-Umeclidin-Vilant (TRELEGY ELLIPTA) 100-62.5-25 MCG/ACT AEPB Inhale 1 puff into the lungs daily. 90 each 1   folic acid (FOLVITE) 400 MCG tablet Take 400 mcg by mouth every evening.     HUMALOG KWIKPEN 200  UNIT/ML KwikPen 20 Units as needed. On a sliding scale     insulin aspart (NOVOLOG FLEXPEN) 100 UNIT/ML FlexPen As directed twice daily     insulin degludec (TRESIBA) 100 UNIT/ML FlexTouch Pen Inject 50 Units into the skin 2 (two) times daily.     loratadine (CLARITIN) 10 MG tablet Take 10 mg by mouth in the morning.     losartan (COZAAR) 50 MG tablet TAKE 1 TABLET BY MOUTH DAILY 100 tablet 2   melatonin 5 MG TABS Take 5 mg by mouth at bedtime.     metoprolol (TOPROL-XL) 200 MG 24 hr tablet TAKE 1 TABLET BY MOUTH DAILY  WITH OR IMMEDIATELY FOLLOWING A  MEAL 100 tablet 2   Multiple Vitamins-Minerals (CENTRUM CARDIO) TABS Take 1 tablet by mouth in the morning.     omega-3 acid ethyl esters (LOVAZA) 1 g capsule Take 2 g by mouth 2 (two) times daily.     oxymetazoline (AFRIN) 0.05 % nasal spray Place 1 spray into both nostrils as needed for congestion.     OZEMPIC, 0.25 OR 0.5 MG/DOSE, 2 MG/3ML SOPN Inject 0.5 Units into the skin once a week. On Fridays     potassium chloride SA (KLOR-CON M) 20 MEQ tablet TAKE 3 TABLETS BY MOUTH 3 TIMES  DAILY 810 tablet 3   RELION PEN NEEDLE 31G/8MM 31G X 8 MM MISC 3 (three) times  daily.     rosuvastatin (CRESTOR) 40 MG tablet TAKE 1 TABLET BY MOUTH DAILY 100 tablet 2   sertraline (ZOLOFT) 100 MG tablet Take 150 mg by mouth in the morning.     spironolactone (ALDACTONE) 25 MG tablet TAKE 1 TABLET BY MOUTH DAILY 100 tablet 2   torsemide (DEMADEX) 20 MG tablet TAKE 4 TABLETS BY MOUTH TWICE  DAILY MAY TAKE AN ADDITIONAL 1  TABLET BY MOUTH DAILY AS NEEDED  FOR 2 TO 3 LBS WEIGHT GAIN IN 1  DAY OR 5 LBS IN 1 WEEK 720 tablet 2   zinc sulfate 220 (50 Zn) MG capsule Take 220 mg by mouth every evening.     insulin detemir (LEVEMIR) 100 UNIT/ML injection Inject 50 Units into the skin 2 (two) times daily.     pantoprazole (PROTONIX) 40 MG tablet Take 1 tablet (40 mg total) by mouth daily before breakfast. 30 tablet 5   No facility-administered medications prior to visit.     Review of Systems:   Constitutional: No night sweats, fevers, chills, fatigue, or lassitude. +intentional weight loss  HEENT: No headaches, difficulty swallowing, tooth/dental problems, or sore throat. No sneezing, itching, ear ache +nasal congestion, post nasal drip (baseline) CV:  No chest pain, orthopnea, PND, swelling in lower extremities, anasarca, dizziness, palpitations, syncope Resp: +shortness of breath with exertion (baseline); chronic cough. No excess mucus or change in color of mucus. No hemoptysis. No wheezing.  No chest wall deformity GI:  No heartburn, indigestion, loss of appetite GU: No nocturia  Skin: No rash, lesions, ulcerations MSK:  No joint pain or swelling.   Neuro: No dizziness or lightheadedness.  Psych: No depression or anxiety. Mood stable.     Physical Exam:  BP 124/66   Pulse 69   Ht 5' 11.5" (1.816 m)   Wt 271 lb (122.9 kg)   SpO2 96%   BMI 37.27 kg/m   GEN: Pleasant, interactive, well-kempt; chronically-ill appearing; obese; in no acute distress HEENT:  Normocephalic and atraumatic. PERRLA. Sclera white. Nasal turbinates pink, moist and patent bilaterally. No  rhinorrhea present. Oropharynx  pink and moist, without exudate or edema. No lesions, ulcerations, or postnasal drip.  NECK:  Supple w/ fair ROM. Thyroid symmetrical with no goiter or nodules palpated. No lymphadenopathy.   CV: Irregular rhythm, rate controlled, no m/r/g, no peripheral edema. Pulses intact, +2 bilaterally. No cyanosis, pallor or clubbing. PULMONARY:  Unlabored, regular breathing. Diminished bilaterally A&P w/o wheezes/rales/rhonchi. No accessory muscle use.  GI: BS present and normoactive. Soft, non-tender to palpation. No organomegaly or masses detected.  MSK: No erythema, warmth or tenderness. Cap refil <2 sec all extrem. No deformities or joint swelling noted.  Neuro: A/Ox3. No focal deficits noted.   Skin: Warm, no lesions or rashe Psych: Normal affect and behavior. Judgement and thought content appropriate.     Lab Results:  CBC    Component Value Date/Time   WBC 5.2 09/15/2023 1126   RBC 4.82 09/15/2023 1126   HGB 13.8 09/15/2023 1126   HGB 13.7 09/01/2023 1058   HCT 45.1 09/15/2023 1126   HCT 42.7 09/01/2023 1058   PLT 135 (L) 09/15/2023 1126   PLT 131 (L) 09/01/2023 1058   MCV 93.6 09/15/2023 1126   MCV 90 09/01/2023 1058   MCH 28.6 09/15/2023 1126   MCHC 30.6 09/15/2023 1126   RDW 15.6 (H) 09/15/2023 1126   RDW 14.7 09/01/2023 1058   LYMPHSABS 0.6 (L) 08/28/2021 1821   LYMPHSABS 0.7 08/21/2021 1549   MONOABS 0.5 08/28/2021 1821   EOSABS 0.1 08/28/2021 1821   EOSABS 0.1 08/21/2021 1549   BASOSABS 0.0 08/28/2021 1821   BASOSABS 0.0 08/21/2021 1549    BMET    Component Value Date/Time   NA 149 (H) 09/01/2023 1058   K 4.3 09/01/2023 1058   CL 102 09/01/2023 1058   CO2 32 (H) 09/01/2023 1058   GLUCOSE 89 09/01/2023 1058   GLUCOSE 142 (H) 08/09/2023 1412   BUN 29 (H) 09/01/2023 1058   CREATININE 1.31 (H) 09/01/2023 1058   CALCIUM 9.4 09/01/2023 1058   GFRNONAA >60 08/09/2023 1412    BNP    Component Value Date/Time   BNP 258.7 (H)  09/15/2021 1511     Imaging:  US BIOPSY (LIVER) Result Date: 09/15/2023 INDICATION: Elevated liver function tests and need for random liver biopsy for further evaluation. EXAM: ULTRASOUND GUIDED CORE BIOPSY OF LIVER MEDICATIONS: None. ANESTHESIA/SEDATION: Moderate (conscious) sedation was employed during this procedure. A total of Versed 1.0 mg and Fentanyl 50 mcg was administered intravenously. Moderate Sedation Time: 19 minutes. The patient's level of consciousness and vital signs were monitored continuously by radiology nursing throughout the procedure under my direct supervision. PROCEDURE: The procedure, risks, benefits, and alternatives were explained to the patient. Questions regarding the procedure were encouraged and answered. The patient understands and consents to the procedure. A time-out was performed prior to initiating the procedure. The abdominal wall was prepped with chlorhexidine in a sterile fashion, and a sterile drape was applied covering the operative field. A sterile gown and sterile gloves were used for the procedure. Local anesthesia was provided with 1% Lidocaine. After choosing a site for biopsy within the right lobe of the liver, a 17 gauge trocar needle was advanced under direct ultrasound guidance into the right lobe. After confirming needle tip position, 3 separate coaxial 18 gauge core biopsy samples were obtained and submitted in formalin. A slurry of Gel-Foam pledgets was then injected as the outer needle was retracted and removed. Additional ultrasound was performed. COMPLICATIONS: None immediate. FINDINGS: Solid core biopsy samples were obtained from the liver.  IMPRESSION: Ultrasound-guided core biopsy performed of right lobe liver parenchyma. Electronically Signed   By: Irish Lack M.D.   On: 09/15/2023 14:59   US Abdomen Complete Result Date: 09/06/2023 CLINICAL DATA:  Cirrhosis EXAM: ABDOMEN ULTRASOUND COMPLETE COMPARISON:  None Available. FINDINGS: Gallbladder:  There is a 5 mm gallbladder polyp. No gallbladder wall thickening or pericholecystic fluid. Negative sonographic Murphy's sign. Common bile duct: Diameter: 4 mm Liver: No focal lesion identified. Within normal limits in parenchymal echogenicity. Portal vein is patent on color Doppler imaging with normal direction of blood flow towards the liver. IVC: No abnormality visualized. Pancreas: Visualized portion unremarkable. Spleen: Size and appearance within normal limits. Right Kidney: Length: 12.3 cm. Normal renal cortical thickness and echogenicity. 7 mm stone. There is a 1.4 cm cyst. Left Kidney: Length: 14.4 cm. Normal renal cortical thickness and echogenicity. No hydronephrosis. There is a 1.1 cm complicated cyst. 13 mm echogenic focus, artifact versus stone. Abdominal aorta: No aneurysm visualized. Other findings: None. IMPRESSION: 1. No acute process. 2. There is a 5 mm gallbladder polyp. No imaging follow-up needed. 3. Bilateral renal cysts. Complicated cyst on the left. Follow-up renal ultrasound in 6 months is recommended. 4. Probable bilateral renal stones. Electronically Signed   By: Annia Belt M.D.   On: 09/06/2023 13:04    Administration History     None           No data to display          No results found for: "NITRICOXIDE"      Assessment & Plan:   COPD (chronic obstructive pulmonary disease) (HCC) Severe COPD with high symptom burden that is likely multifactorial related to lung disease, heart disease, body habitus and deconditioning.  Currently maintained on triple therapy regimen.  Will provide him with samples of Breztri to use in place of Trelegy for the next few weeks.  He has perceived benefit from use, will update his prescription.  Side effect profile reviewed.  Teach back performed.  Encouraged to work on graded exercises.  Trigger prevention.  Remain up-to-date on vaccines.  Action plan in place.  Patient Instructions  Continue Albuterol inhaler 2 puffs or 3 mL  neb every 6 hours as needed for shortness of breath or wheezing. Notify if symptoms persist despite rescue inhaler/neb use.  Stop Trelegy and trial Breztri 2 puffs Twice daily. Brush tongue and rinse mouth afterwards. Let me know if you see an improvement with this and I will switch your prescription over. If not you can return to the Trelegy  Continue flonase nasal spray 2 sprays each nostril daily Continue claritin 1 tab daily Continue supplemental oxygen 3 lpm for goal >88-90%  Continue to use CPAP every night, minimum of 4-6 hours a night.  Change equipment as directed. Wash your tubing with warm soap and water daily, hang to dry. Wash humidifier portion weekly. Use bottled, distilled water and change daily Be aware of reduced alertness and do not drive or operate heavy machinery if experiencing this or drowsiness.  Exercise encouraged, as tolerated. Healthy weight management discussed.  Avoid or decrease alcohol consumption and medications that make you more sleepy, if possible. Notify if persistent daytime sleepiness occurs even with consistent use of PAP therapy.  Enjoy your trip to [place]   Follow up in 6 months with Dr. Sherene Sires for COPD and 1 year with Florentina Addison Marisol Glazer,NP (virtual visit ok) for sleep apnea If symptoms do not improve or worsen, please contact office for sooner follow up or  seek emergency care.    Chronic respiratory failure with hypoxia (HCC) Stable on current regimen without increased O2 requirement.  Continue 3 L/min supplemental O2 for goal greater than 88 to 90%  OSA on CPAP Excellent compliance and control.  Receives benefit from use.  Aware of proper care/use of device.  Risks of untreated sleep apnea reviewed.  Encouraged to continue using nightly.  Monitor mask fit, leakage and pressure settings with ongoing weight loss.  Safe driving practices reviewed.  Rx for mask and updated supplies provided today.  Obesity BMI 37.  Continue healthy weight loss measures.  CHF  (congestive heart failure) (HCC) Euvolemic on exam today.  Follow-up with cardiology as scheduled.  Permanent atrial fibrillation (HCC) Rate controlled.  Compliant with AC.  Follow-up with cardiology.   Advised if symptoms do not improve or worsen, to please contact office for sooner follow up or seek emergency care.   I spent 35 minutes of dedicated to the care of this patient on the date of this encounter to include pre-visit review of records, face-to-face time with the patient discussing conditions above, post visit ordering of testing, clinical documentation with the electronic health record, making appropriate referrals as documented, and communicating necessary findings to members of the patients care team.  Noemi Chapel, NP 09/23/2023  Pt aware and understands NP's role.

## 2023-09-23 NOTE — Assessment & Plan Note (Signed)
 Stable on current regimen without increased O2 requirement.  Continue 3 L/min supplemental O2 for goal greater than 88 to 90%

## 2023-09-23 NOTE — Patient Instructions (Addendum)
 Continue Albuterol inhaler 2 puffs or 3 mL neb every 6 hours as needed for shortness of breath or wheezing. Notify if symptoms persist despite rescue inhaler/neb use.  Stop Trelegy and trial Breztri 2 puffs Twice daily. Brush tongue and rinse mouth afterwards. Let me know if you see an improvement with this and I will switch your prescription over. If not you can return to the Trelegy  Continue flonase nasal spray 2 sprays each nostril daily Continue claritin 1 tab daily Continue supplemental oxygen 3 lpm for goal >88-90%  Continue to use CPAP every night, minimum of 4-6 hours a night.  Change equipment as directed. Wash your tubing with warm soap and water daily, hang to dry. Wash humidifier portion weekly. Use bottled, distilled water and change daily Be aware of reduced alertness and do not drive or operate heavy machinery if experiencing this or drowsiness.  Exercise encouraged, as tolerated. Healthy weight management discussed.  Avoid or decrease alcohol consumption and medications that make you more sleepy, if possible. Notify if persistent daytime sleepiness occurs even with consistent use of PAP therapy.  Enjoy your trip to [place]   Follow up in 6 months with Dr. Sherene Riley for COPD and 1 year with Tommy Addison Alnita Aybar,NP (virtual visit ok) for sleep apnea If symptoms do not improve or worsen, please contact office for sooner follow up or seek emergency care.

## 2023-09-23 NOTE — Assessment & Plan Note (Signed)
 Severe COPD with high symptom burden that is likely multifactorial related to lung disease, heart disease, body habitus and deconditioning.  Currently maintained on triple therapy regimen.  Will provide him with samples of Breztri to use in place of Trelegy for the next few weeks.  He has perceived benefit from use, will update his prescription.  Side effect profile reviewed.  Teach back performed.  Encouraged to work on graded exercises.  Trigger prevention.  Remain up-to-date on vaccines.  Action plan in place.  Patient Instructions  Continue Albuterol inhaler 2 puffs or 3 mL neb every 6 hours as needed for shortness of breath or wheezing. Notify if symptoms persist despite rescue inhaler/neb use.  Stop Trelegy and trial Breztri 2 puffs Twice daily. Brush tongue and rinse mouth afterwards. Let me know if you see an improvement with this and I will switch your prescription over. If not you can return to the Trelegy  Continue flonase nasal spray 2 sprays each nostril daily Continue claritin 1 tab daily Continue supplemental oxygen 3 lpm for goal >88-90%  Continue to use CPAP every night, minimum of 4-6 hours a night.  Change equipment as directed. Wash your tubing with warm soap and water daily, hang to dry. Wash humidifier portion weekly. Use bottled, distilled water and change daily Be aware of reduced alertness and do not drive or operate heavy machinery if experiencing this or drowsiness.  Exercise encouraged, as tolerated. Healthy weight management discussed.  Avoid or decrease alcohol consumption and medications that make you more sleepy, if possible. Notify if persistent daytime sleepiness occurs even with consistent use of PAP therapy.  Enjoy your trip to [place]   Follow up in 6 months with Dr. Sherene Sires for COPD and 1 year with Florentina Addison Lanayah Gartley,NP (virtual visit ok) for sleep apnea If symptoms do not improve or worsen, please contact office for sooner follow up or seek emergency care.

## 2023-09-23 NOTE — Assessment & Plan Note (Signed)
Euvolemic on exam today.  Follow-up with cardiology as scheduled. 

## 2023-09-23 NOTE — Assessment & Plan Note (Addendum)
 Excellent compliance and control.  Receives benefit from use.  Aware of proper care/use of device.  Risks of untreated sleep apnea reviewed.  Encouraged to continue using nightly.  Monitor mask fit, leakage and pressure settings with ongoing weight loss.  Safe driving practices reviewed.  Rx for mask and updated supplies provided today.

## 2023-09-23 NOTE — Assessment & Plan Note (Signed)
 BMI 37.  Continue healthy weight loss measures.

## 2023-09-23 NOTE — Assessment & Plan Note (Signed)
 Rate controlled.  Compliant with AC.  Follow-up with cardiology.

## 2023-09-29 ENCOUNTER — Other Ambulatory Visit: Payer: Self-pay | Admitting: Cardiology

## 2023-09-29 NOTE — Telephone Encounter (Signed)
 Prescription refill request for Eliquis received. Indication: AF Last office visit: 08/09/23  Everlean Alstrom NP Scr: 1.31 on 09/01/23  Epic Age: 78 Weight: 123kg  Based on above findings Eliquis 5mg  twice daily is the appropriate dose.  Refill approved.

## 2023-10-16 ENCOUNTER — Other Ambulatory Visit: Payer: Self-pay | Admitting: Nurse Practitioner

## 2023-10-20 ENCOUNTER — Ambulatory Visit (INDEPENDENT_AMBULATORY_CARE_PROVIDER_SITE_OTHER): Admitting: Gastroenterology

## 2023-10-25 ENCOUNTER — Ambulatory Visit (INDEPENDENT_AMBULATORY_CARE_PROVIDER_SITE_OTHER): Admitting: Gastroenterology

## 2023-10-25 VITALS — BP 119/72 | HR 71 | Temp 97.7°F | Ht 71.5 in | Wt 266.0 lb

## 2023-10-25 DIAGNOSIS — R748 Abnormal levels of other serum enzymes: Secondary | ICD-10-CM | POA: Diagnosis not present

## 2023-10-25 DIAGNOSIS — K7460 Unspecified cirrhosis of liver: Secondary | ICD-10-CM

## 2023-10-25 DIAGNOSIS — K760 Fatty (change of) liver, not elsewhere classified: Secondary | ICD-10-CM | POA: Diagnosis not present

## 2023-10-25 DIAGNOSIS — D696 Thrombocytopenia, unspecified: Secondary | ICD-10-CM | POA: Diagnosis not present

## 2023-10-25 DIAGNOSIS — Z6837 Body mass index (BMI) 37.0-37.9, adult: Secondary | ICD-10-CM

## 2023-10-25 DIAGNOSIS — K824 Cholesterolosis of gallbladder: Secondary | ICD-10-CM | POA: Diagnosis not present

## 2023-10-25 NOTE — Patient Instructions (Signed)
 It was very nice to meet you today, as dicussed with will plan for the following :  1) labwork and ultrasound in 4 months

## 2023-10-25 NOTE — Progress Notes (Signed)
 Tommy Riley , M.D. Gastroenterology & Hepatology [facility] [address] [place], [place] [address] Primary Care Physician: Pomposini, Amiel Balder, MD No address on file  Chief Complaint:  Elevated liver enzymes , MASLD and imaging concerning for cirrhosis   History of Present Illness: Tommy Riley is a 78 y.o. male with significant co-morbidities including CKD, emphysema who continues on oxygen, CHF, diabetes, Afib, hypertension who presents for evaluation of Elevated liver enzymes , MASLD and imaging concerning for cirrhosis .  Patient is accompanying with wife in clinic today.    Denies any herbal medications or new medications.  Patient has been using Ozempic and has lost 30 pounds since.  Reports he has good appetite denies any dysphagia. The patient denies having any nausea, vomiting, fever, chills, hematochezia, melena, hematemesis, abdominal distention, abdominal pain, diarrhea, jaundice, pruritus  Last ION:GEXB Last Colonoscopy:many years ago complicated with hypoxia   FHx: neg for any gastrointestinal/liver disease, no malignancies Social: neg smoking, alcohol or illicit drug use Surgical: no abdominal surgeries  Last labs demonstrating A1c 6.8 platelet 148 Past Medical History: Past Medical History:  Diagnosis Date   Anxiety    Atrial fibrillation and flutter (HCC)    Atrial fibrillation ablation 2011 at [facility]   CAD in native artery    Chronic systolic heart failure (HCC)    CKD (chronic kidney disease), stage III (HCC)    COPD (chronic obstructive pulmonary disease) (HCC)    COVID-22 February 2020   Depression    Essential hypertension    Gout    Hyperlipidemia    Ischemic heart disease    OSA (obstructive sleep apnea)    Stroke (HCC) 2003   Type 2 diabetes mellitus (HCC)     Past Surgical History: Past Surgical History:  Procedure Laterality Date   ATRIAL FIBRILLATION ABLATION  2011   [facility]   AV NODE  ABLATION N/A 05/07/2021   Procedure: AV NODE ABLATION;  Surgeon: Tammie Fall, MD;  Location: [facility] INVASIVE CV LAB;  Service: Cardiovascular;  Laterality: N/A;   AV NODE ABLATION N/A 09/01/2021   Procedure: AV NODE ABLATION;  Surgeon: Tammie Fall, MD;  Location: [facility] INVASIVE CV LAB;  Service: Cardiovascular;  Laterality: N/A;   CATARACT EXTRACTION Bilateral    CORONARY ARTERY BYPASS GRAFT  2001   HERNIA REPAIR Right    ICD IMPLANT N/A 05/07/2021   Procedure: ICD IMPLANT;  Surgeon: Tammie Fall, MD;  Location: [facility] INVASIVE CV LAB;  Service: Cardiovascular;  Laterality: N/A;   RIGHT/LEFT HEART CATH AND CORONARY/GRAFT ANGIOGRAPHY N/A 05/05/2021   Procedure: RIGHT/LEFT HEART CATH AND CORONARY/GRAFT ANGIOGRAPHY;  Surgeon: Avanell Leigh, MD;  Location: [facility] INVASIVE CV LAB;  Service: Cardiovascular;  Laterality: N/A;   TONSILLECTOMY AND ADENOIDECTOMY      Family History: Family History  Problem Relation Age of Onset   Raynaud syndrome Mother    Stroke Father    Diabetes Mellitus II Father    Hypertension Father    Hypertension Sister    Diabetes Mellitus II Sister    Stroke Brother    Crohn's disease Brother    Breast cancer Brother    COPD Brother     Social History: Social History   Tobacco Use  Smoking Status Former   Current packs/day: 0.00   Average packs/day: 2.5 packs/day for 50.0 years (125.0 ttl pk-yrs)   Types: Cigarettes   Start date: 06/16/1961   Quit date: 06/17/2011  Years since quitting: 12.0   Passive exposure: Past  Smokeless Tobacco Never   Social History   Substance and Sexual Activity  Alcohol Use Yes   Comment: occasional beer less than once a month   Social History   Substance and Sexual Activity  Drug Use No    Allergies: Allergies  Allergen Reactions   Codeine Anaphylaxis, Other (See Comments) and Shortness Of Breath    Tightness in chest  Other Reaction(s): chest pain  Tightness in chest    Other reaction(s): Other (See  Comments)  Tightness in chest  Tightness in chest  Tightness in chest    Medications: Current Outpatient Medications  Medication Sig Dispense Refill   ACCU-CHEK AVIVA PLUS test strip      albuterol  (PROVENTIL ) (2.5 MG/3ML) 0.083% nebulizer solution Take 3 mLs (2.5 mg total) by nebulization every 6 (six) hours as needed for wheezing or shortness of breath. 75 mL 4   albuterol  (VENTOLIN  HFA) 108 (90 Base) MCG/ACT inhaler USE 2 INHALATIONS BY MOUTH  EVERY 4 HOURS AS NEEDED FOR SHORTNESS OF BREATH OR  WHEEZE 54 g 1   allopurinol  (ZYLOPRIM ) 100 MG tablet Taking two tablets by mouth in the morning and 1 tablet by mouth in the evening     apixaban  (ELIQUIS ) 5 MG TABS tablet Take 1 tablet by mouth twice daily 180 tablet 1   Ascorbic Acid  (VITAMIN C PO) Take 2,000 mg by mouth daily.     aspirin  81 MG EC tablet Take 81 mg by mouth in the morning.     Blood Glucose Monitoring Suppl (ONE TOUCH ULTRA 2) W/DEVICE KIT by Does not apply route.     Cholecalciferol  (VITAMIN D3) 2000 UNITS TABS Take 1 capsule by mouth in the morning and at bedtime.     dapagliflozin  propanediol (FARXIGA ) 10 MG TABS tablet Take 1 tablet (10 mg total) by mouth daily. 90 tablet 3   diphenhydramine -acetaminophen  (TYLENOL  PM) 25-500 MG TABS tablet Take 2 tablets by mouth at bedtime.     ezetimibe  (ZETIA ) 10 MG tablet Take 10 mg by mouth in the morning.     fluticasone  (FLONASE ) 50 MCG/ACT nasal spray Place 1 spray into both nostrils daily. 16 g 2   Fluticasone -Umeclidin-Vilant (TRELEGY ELLIPTA ) 100-62.5-25 MCG/ACT AEPB Inhale 1 puff into the lungs daily. 90 each 4   folic acid  (FOLVITE ) 400 MCG tablet Take 400 mcg by mouth every evening.     HUMALOG  KWIKPEN 200 UNIT/ML KwikPen 20 Units as needed. On a sliding scale     loratadine  (CLARITIN ) 10 MG tablet Take 10 mg by mouth in the morning.     losartan  (COZAAR ) 50 MG tablet Take 1 tablet (50 mg total) by mouth daily. 90 tablet 3   melatonin 5 MG TABS Take 5 mg by mouth at bedtime.      metoprolol  (TOPROL -XL) 200 MG 24 hr tablet TAKE 1 TABLET BY MOUTH DAILY  WITH OR IMMEDIATELY FOLLOWING A  MEAL 100 tablet 2   Multiple Vitamins-Minerals (CENTRUM CARDIO) TABS Take 1 tablet by mouth in the morning.     omega-3 acid ethyl esters (LOVAZA ) 1 g capsule Take 2 g by mouth 2 (two) times daily.     oxymetazoline  (AFRIN) 0.05 % nasal spray Place 1 spray into both nostrils as needed for congestion.     OZEMPIC, 0.25 OR 0.5 MG/DOSE, 2 MG/3ML SOPN Inject 0.5 Units into the skin once a week.     potassium chloride  SA (KLOR-CON  M) 20 MEQ tablet TAKE  3 TABLETS BY MOUTH 3 TIMES  DAILY 810 tablet 3   RELION PEN NEEDLE 31G/8MM 31G X 8 MM MISC 3 (three) times daily.     rosuvastatin  (CRESTOR ) 40 MG tablet TAKE 1 TABLET BY MOUTH DAILY 100 tablet 2   sertraline  (ZOLOFT ) 100 MG tablet Take 150 mg by mouth in the morning.     spironolactone  (ALDACTONE ) 25 MG tablet TAKE 1 TABLET BY MOUTH DAILY 100 tablet 2   torsemide  (DEMADEX ) 20 MG tablet TAKE 4 TABLETS BY MOUTH TWICE  DAILY MAY TAKE AN ADDITIONAL 1  TABLET BY MOUTH DAILY AS NEEDED  FOR 2 TO 3 LBS WEIGHT GAIN IN 1  DAY OR 5 LBS IN 1 WEEK 720 tablet 2   zinc  sulfate 220 (50 Zn) MG capsule Take 220 mg by mouth every evening.     No current facility-administered medications for this visit.    Review of Systems: GENERAL: negative for malaise, night sweats HEENT: No changes in hearing or vision, no nose bleeds or other nasal problems. NECK: Negative for lumps, goiter, pain and significant neck swelling RESPIRATORY: Negative for cough, wheezing CARDIOVASCULAR: Negative for chest pain, leg swelling, palpitations, orthopnea GI: SEE HPI MUSCULOSKELETAL: Negative for joint pain or swelling, back pain, and muscle pain. SKIN: Negative for lesions, rash HEMATOLOGY Negative for prolonged bleeding, bruising easily, and swollen nodes. ENDOCRINE: Negative for cold or heat intolerance, polyuria, polydipsia and goiter. NEURO: negative for tremor, gait  imbalance, syncope and seizures. The remainder of the review of systems is noncontributory.   Physical Exam: BP 131/75 (BP Location: Right Arm, Patient Position: Sitting, Cuff Size: Large)   Pulse 70   Temp 98.6 F (37 C) (Oral)   Ht 5' 11.5" (1.816 m)   Wt 271 lb 4.8 oz (123.1 kg)   BMI 37.31 kg/m  GENERAL: The patient is AO x3, in no acute distress. HEENT: Head is normocephalic and atraumatic. EOMI are intact. Mouth is well hydrated and without lesions. NECK: Supple. No masses LUNGS: Clear to auscultation. No presence of rhonchi/wheezing/rales. Adequate chest expansion HEART: RRR, normal s1 and s2. ABDOMEN: Soft, nontender, no guarding, no peritoneal signs, and nondistended. BS +. No masses.  Imaging/Labs: as above     Latest Ref Rng & Units 08/29/2021    4:39 AM 08/28/2021    6:21 PM 08/21/2021    3:49 PM  CBC  WBC 4.0 - 10.5 K/uL 5.0  5.4  5.2   Hemoglobin 13.0 - 17.0 g/dL 16.1  09.6  04.5   Hematocrit 39.0 - 52.0 % 47.2  43.2  41.5   Platelets 150 - 400 K/uL 120  192  149    No results found for: "IRON", "TIBC", "FERRITIN"  I personally reviewed and interpreted the available labs, imaging and endoscopic files.  Elastrography 06/2023  Median kPa: 4.4   IQR: 3.1   IQR/Median kPa ratio: 0.70   Data quality: i. IQR/Median kPa ratio of 0.6 or greater when using the DAX probe indicates reduced accuracy    Ultrasound 11/2022    Recent labs done 05/31/2023 AST 51 ALT 71 alk phos 129 T. bili 0.7 creatinine 0.74 hemoglobin A1c 7.1   Workup :  INR: 1.1 AMA 36 (elevated) AST 75 ALT 83 (elevated) Alk phos 121 normal T. bili Negative ANA, ASMA Hep A nonimmune Hep B nonexposed nonimmune Hep C negative HIV negative Normal AFP IgM : 55  Labs 08/2023   INR 1.1 Creatinine slightly elevated 1.31 Liver enzymes continue to be elevated : ALT:129  AST: 80 PLT:131 AFP : 3.9 Normal IgM  Liver biopsy : FINAL MICROSCOPIC DIAGNOSIS:   A. LIVER, RIGHT LOBE,  NEEDLE CORE BIOPSY:      Mild portal inflammation with histiocytes.      No evidence of advanced fibrosis.      See comment.   COMMENT:   The biopsy contains three cores of hepatic parenchyma with preserved lobular architecture. The portal tracts contain minimal inflammation composed of lymphocytes and histiocytes. There is no evidence of bile duct injury, florid ductal lesion or ductopenia. No interface activity identified. The lobular parenchyma does not show evidence of steatosis, cholestasis, or hepatocytes injury. Trichrome stain does not show evidence of advanced fibrosis. Iron stain is negative for iron deposit. PAS/D stain highlight ceroid laden macrophages and is negative for cytoplasmic globules. Reticulin stain shows an intact reticulin framework.   The overall findings are non-specific. The presence of portal histiocytes is indicative of resolved liver injury. The etiologic considerations include infection, medication effect, supplement toxin, drug induced live injury, metabolic syndrome, Wilson disease, et al. Correlation with clinical information is recommended  Impression and Plan:  Tommy Riley is a 78 y.o. male with significant co-morbidities including CKD, emphysema who continues on oxygen, CHF, diabetes, Afib, hypertension who presents for evaluation of Elevated liver enzymes , MASLD and imaging concerning for cirrhosis .  #Cirrhosis  # Elevated liver enzymes   This is likely due to MASLD with risk factors BMI 37 and metabolic syndrome ( DM, HTN) Hepatocellular pattern elevation liver enzymes  Fib 4 Score 0.14  ( Although Fib-4 interpretation is not validated for people under 35 or over 57 years of age.)   Last ultrasound (11/2022) with possible cirrhosis and splenomegaly.  Given thrombocytopenia patient likely has compensated cirrhosis   Although elastography suggesting otherwise given kPA: 4.4 not suggesting advanced fibrosis   Workup above with  negative hep A, B, and C, ANA ASMA but has elevated AMA (36)   Given continued elevated liver enzymes positive AMA which is a very specific marker for PBC, and contradicting imaging and Elastrogragraphy findings patient underwent liver biopsy which suggested possible DILI or metabolic syndrome and did not show any fibrosis  Ultrasound with possible cirrhosis and splenomegaly.  Given thrombocytopenia patient likely has compensated cirrhosis even though biopsy did not show cirrhosis as biopsies can demonstrate patch findings    # Hepatic encephalopathy None on exam  - Avoid opiates or benzodiazepines  # Ascites None on exam but will continue to evaluate with ultrasound  - Low sodium diet - Diuretics : patient already on torsemide    # Esophageal varices - Patient has splenomegaly and thrombocytopenia hence likely has pHTN. He is already on betablocker and upper endoscopy is not indicated as betablocker will act as prophylaxis against varices   # HCC screening Elastography without significant fibrosis or ultrasound previously did show possible cirrhosis and splenomegaly; would continue to treat as if patient has advanced fibrosis and recommend every 6 ultrasound and AFP now  Recommendation   - Check CBC, MELD labs and AFPin 4 months -HCC Screening ultrasound in 4 months - Reduce salt intake to <2 g per day - Can take Tylenol  max of 2 g per day (650 mg q8h) for pain - Avoid NSAIDs for pain - Avoid eating raw oysters/shellfish - continue Vaccination against hepatitis A and B  ( he started it with PCP)   #Gallbladder polyp  Gallbladder with 5 mm polyp . Patient only risk factor is Age >84  and size 5mm, recommendation is surveillance at 1,3,5 years , if asymptomatic   Patient to follow-up with PCP and urology for complex renal cyst  #HCM   Discussed with patient risk indication limitation of colon cancer screening with colonoscopy or stool based testing.  Patient reports last  colonoscopy he had hypoxic event given his condition.  At this time given patient advanced age and significant comorbidities would like to defer any elective procedure at this time  Orders Placed This Encounter  Procedures   US  ABDOMEN LIMITED RUQ (LIVER/GB)   AFP tumor marker   Protime-INR   Comprehensive metabolic panel with GFR   NASH FibroSure(R) Plus    All questions were answered.      Jalen Daluz Faizan Maelyn Berrey, MD Gastroenterology and Hepatology [facility]   This chart has been completed using [facility] Dictation software, and while attempts have been made to ensure accuracy , certain words and phrases may not be transcribed as intended

## 2023-10-28 ENCOUNTER — Other Ambulatory Visit: Payer: Self-pay | Admitting: *Deleted

## 2023-10-28 MED ORDER — DAPAGLIFLOZIN PROPANEDIOL 10 MG PO TABS: 10.0000 mg | ORAL_TABLET | Freq: Every day | ORAL | 3 refills | Status: DC

## 2023-10-28 NOTE — Telephone Encounter (Signed)
 Fax received requesting new rx for farxiga  10 mg for AZ&ME PAP

## 2023-11-04 ENCOUNTER — Ambulatory Visit (INDEPENDENT_AMBULATORY_CARE_PROVIDER_SITE_OTHER): Payer: Medicare Other

## 2023-11-04 DIAGNOSIS — I255 Ischemic cardiomyopathy: Secondary | ICD-10-CM | POA: Diagnosis not present

## 2023-11-04 LAB — CUP PACEART REMOTE DEVICE CHECK
Battery Remaining Longevity: 120 mo
Battery Remaining Percentage: 100 %
Brady Statistic RA Percent Paced: 99 %
Brady Statistic RV Percent Paced: 0 %
Date Time Interrogation Session: 20250501005100
HighPow Impedance: 67 Ohm
Implantable Lead Connection Status: 753985
Implantable Lead Connection Status: 753985
Implantable Lead Connection Status: 753985
Implantable Lead Implant Date: 20221103
Implantable Lead Implant Date: 20221103
Implantable Lead Implant Date: 20221103
Implantable Lead Location: 753858
Implantable Lead Location: 753860
Implantable Lead Location: 753860
Implantable Lead Model: 138
Implantable Lead Model: 3830
Implantable Lead Model: 4671
Implantable Lead Serial Number: 304581
Implantable Lead Serial Number: 852093
Implantable Pulse Generator Implant Date: 20221103
Lead Channel Impedance Value: 492 Ohm
Lead Channel Impedance Value: 526 Ohm
Lead Channel Impedance Value: 557 Ohm
Lead Channel Setting Pacing Amplitude: 2.5 V
Lead Channel Setting Sensing Sensitivity: 0.5 mV
Lead Channel Setting Sensing Sensitivity: 1 mV
Pulse Gen Serial Number: 390312

## 2023-11-07 ENCOUNTER — Encounter: Payer: Self-pay | Admitting: Internal Medicine

## 2023-12-16 NOTE — Progress Notes (Signed)
 Remote ICD transmission.

## 2023-12-17 ENCOUNTER — Other Ambulatory Visit: Payer: Self-pay | Admitting: Nurse Practitioner

## 2023-12-17 DIAGNOSIS — J9611 Chronic respiratory failure with hypoxia: Secondary | ICD-10-CM

## 2023-12-20 ENCOUNTER — Other Ambulatory Visit: Payer: Self-pay | Admitting: Cardiology

## 2023-12-22 ENCOUNTER — Other Ambulatory Visit (HOSPITAL_COMMUNITY): Payer: Self-pay

## 2023-12-22 ENCOUNTER — Other Ambulatory Visit: Payer: Self-pay

## 2023-12-22 MED ORDER — TORSEMIDE 20 MG PO TABS
20.0000 mg | ORAL_TABLET | ORAL | 2 refills | Status: DC
  Filled 2023-12-22: qty 360, 90d supply, fill #0

## 2023-12-24 ENCOUNTER — Other Ambulatory Visit: Payer: Self-pay

## 2023-12-24 ENCOUNTER — Telehealth: Payer: Self-pay | Admitting: Cardiology

## 2023-12-24 MED ORDER — TORSEMIDE 20 MG PO TABS: 20.0000 mg | ORAL_TABLET | ORAL | 1 refills | Status: DC

## 2023-12-24 NOTE — Telephone Encounter (Signed)
 Pt wife states [facility] delivery has been trying to contact our office. Please advise

## 2023-12-24 NOTE — Telephone Encounter (Signed)
 Pt's medication was sent to pt's pharmacy as requested. Confirmation received.

## 2023-12-27 ENCOUNTER — Ambulatory Visit: Payer: Self-pay

## 2023-12-27 NOTE — Telephone Encounter (Addendum)
 FYI Only or Action Required?: FYI only for provider.  Patient is followed in Pulmonology for COPD/OSA, last seen on 09/23/2023 by Tommy Comer GAILS, NP. Called Nurse Triage reporting Shortness of Breath. Symptoms began several weeks ago. Interventions attempted: OTC medications: mucinex , Prescription medications: z-pack, Rescue inhaler, Maintenance inhaler, and Nebulizer treatments. Symptoms are: unchanged.  Triage Disposition: See Physician Within 24 Hours, Call Specialist Now  Patient/caregiver understands and will follow disposition?: Yes     Copied from CRM [id number]. Topic: Clinical - Red Word Triage >> Dec 27, 2023  8:19 AM Tommy Riley wrote: Red Word that prompted transfer to Nurse Triage:   Pt fell in bathroom on 06/10 Seen last week for evaluation, x-ray showed no fractures but does have fluid around lungs Prescribed antibiotic Still experiencing extreme pain Had to turn up oxygen from 3 to 4 Still having SOB Reason for Disposition  [1] MILD difficulty breathing (e.g., minimal/no SOB at rest, SOB with walking, pulse <100) AND [2] NEW-onset or WORSE than normal  Answer Assessment - Initial Assessment Questions E2C2 Pulmonary Triage - Initial Assessment Questions Chief Complaint (e.g., cough, sob, wheezing, fever, chills, sweat or additional symptoms) *Go to specific symptom protocol after initial questions. SOB, hypoxia (94% at NP office then 99 at home), dry cough  Endorses fall 2 weeks ago (fell on back in tub)-- [facility] (6/16 d/t lack of access) and found fluid in lungs and prescribed abx (azithromycin  250mg  - started Friday, currently on Day 4/5)  How long have symptoms been present? X 1-2 weeks  Have you tested for COVID or Flu? Note: If not, ask patient if a home test can be taken. If so, instruct patient to call back for positive results. No  MEDICINES:   Have you used any OTC meds to help with symptoms? Yes If yes,  ask What medications? Mucinex   Have you used your inhalers/maintenance medication? Yes If yes, What medications? Trelegy - 1 puff daily Albuterol  PRN - wife thinks uses at least 1x a day Triager reviewed albuterol  usage.  If inhaler, ask How many puffs and how often? Note: Review instructions on medication in the chart. See above  OXYGEN: Do you wear supplemental oxygen? Yes If yes, How many liters are you supposed to use? 3L ATC - had to increase to 4L  Do you monitor your oxygen levels? No If yes, What is your reading (oxygen level) today? N/a  What is your usual oxygen saturation reading?  (Note: Pulmonary O2 sats should be 90% or greater) N/a     1. RESPIRATORY STATUS: Describe your breathing? (e.g., wheezing, shortness of breath, unable to speak, severe coughing)      SOB, coughing 2. ONSET: When did this breathing problem begin?      See above 3. PATTERN Does the difficult breathing come and go, or has it been constant since it started?      constant 4. SEVERITY: How bad is your breathing? (e.g., mild, moderate, severe)    - MILD: No SOB at rest, mild SOB with walking, speaks normally in sentences, can lie down, no retractions, pulse < 100.    - MODERATE: SOB at rest, SOB with minimal exertion and prefers to sit, cannot lie down flat, speaks in phrases, mild retractions, audible wheezing, pulse 100-120.    - SEVERE: Very SOB at rest, speaks in single words, struggling to breathe, sitting hunched forward, retractions, pulse > 120      Mild-moderate 5. RECURRENT  SYMPTOM: Have you had difficulty breathing before? If Yes, ask: When was the last time? and What happened that time?      unknown 6. CARDIAC HISTORY: Do you have any history of heart disease? (e.g., heart attack, angina, bypass surgery, angioplasty)      CHF - endorses takes water pills - wife reports increased torsemide  last week (unknown if pt is still taking extra  torsemide ) 7. LUNG HISTORY: Do you have any history of lung disease?  (e.g., pulmonary embolus, asthma, emphysema)     COPD 8. CAUSE: What do you think is causing the breathing problem?      unknown 9. OTHER SYMPTOMS: Do you have any other symptoms? (e.g., dizziness, runny nose, cough, chest pain, fever)     unknown 10. O2 SATURATION MONITOR:  Do you use an oxygen saturation monitor (pulse oximeter) at home? If Yes, ask: What is your reading (oxygen level) today? What is your usual oxygen saturation reading? (e.g., 95%)       N/a 11. PREGNANCY: Is there any chance you are pregnant? When was your last menstrual period?       N/a 12. TRAVEL: Have you traveled out of the country in the last month? (e.g., travel history, exposures)       N/a   Triage was completed with wife [name] so answer assessment was limited but wife is good historian.  Protocols used: Breathing Difficulty-A-AH

## 2023-12-27 NOTE — Telephone Encounter (Signed)
 Pt states he had a fall backwards and hit the liver area on the right side, pt states he is on antibiotic they prescribed him after the fall. He is experiencing SOB since his fall, coughing, wheezing. Lots of nasal discharge. Has been using nasal spray they also prescribed. Confirmed his appt for tommorw with Madelin Stank, NP  Mentioned he needed a refill on his nebulizer solution.

## 2023-12-28 ENCOUNTER — Encounter (HOSPITAL_COMMUNITY): Payer: Self-pay

## 2023-12-28 ENCOUNTER — Emergency Department (HOSPITAL_COMMUNITY)
Admission: EM | Admit: 2023-12-28 | Discharge: 2023-12-28 | Disposition: A | Discharge status: Home / Self Care | Attending: Emergency Medicine | Admitting: Emergency Medicine

## 2023-12-28 ENCOUNTER — Encounter: Payer: Self-pay | Admitting: Adult Health

## 2023-12-28 ENCOUNTER — Ambulatory Visit: Admitting: Adult Health

## 2023-12-28 ENCOUNTER — Emergency Department (HOSPITAL_COMMUNITY)

## 2023-12-28 ENCOUNTER — Other Ambulatory Visit: Payer: Self-pay

## 2023-12-28 VITALS — BP 113/98 | HR 75 | Ht 71.0 in | Wt 263.0 lb

## 2023-12-28 DIAGNOSIS — J9611 Chronic respiratory failure with hypoxia: Secondary | ICD-10-CM | POA: Diagnosis not present

## 2023-12-28 DIAGNOSIS — S32019A Unspecified fracture of first lumbar vertebra, initial encounter for closed fracture: Secondary | ICD-10-CM | POA: Insufficient documentation

## 2023-12-28 DIAGNOSIS — J441 Chronic obstructive pulmonary disease with (acute) exacerbation: Secondary | ICD-10-CM | POA: Diagnosis not present

## 2023-12-28 DIAGNOSIS — K8020 Calculus of gallbladder without cholecystitis without obstruction: Secondary | ICD-10-CM | POA: Insufficient documentation

## 2023-12-28 DIAGNOSIS — Y92002 Bathroom of unspecified non-institutional (private) residence single-family (private) house as the place of occurrence of the external cause: Secondary | ICD-10-CM | POA: Insufficient documentation

## 2023-12-28 DIAGNOSIS — W19XXXA Unspecified fall, initial encounter: Secondary | ICD-10-CM

## 2023-12-28 DIAGNOSIS — S300XXA Contusion of lower back and pelvis, initial encounter: Secondary | ICD-10-CM | POA: Diagnosis not present

## 2023-12-28 DIAGNOSIS — Z79899 Other long term (current) drug therapy: Secondary | ICD-10-CM | POA: Diagnosis not present

## 2023-12-28 DIAGNOSIS — S2241XA Multiple fractures of ribs, right side, initial encounter for closed fracture: Secondary | ICD-10-CM | POA: Insufficient documentation

## 2023-12-28 DIAGNOSIS — J90 Pleural effusion, not elsewhere classified: Secondary | ICD-10-CM | POA: Insufficient documentation

## 2023-12-28 DIAGNOSIS — S32029A Unspecified fracture of second lumbar vertebra, initial encounter for closed fracture: Secondary | ICD-10-CM | POA: Diagnosis not present

## 2023-12-28 DIAGNOSIS — W01198A Fall on same level from slipping, tripping and stumbling with subsequent striking against other object, initial encounter: Secondary | ICD-10-CM | POA: Insufficient documentation

## 2023-12-28 DIAGNOSIS — Z87891 Personal history of nicotine dependence: Secondary | ICD-10-CM

## 2023-12-28 DIAGNOSIS — R079 Chest pain, unspecified: Secondary | ICD-10-CM | POA: Diagnosis present

## 2023-12-28 DIAGNOSIS — Z7901 Long term (current) use of anticoagulants: Secondary | ICD-10-CM | POA: Diagnosis not present

## 2023-12-28 DIAGNOSIS — Z794 Long term (current) use of insulin: Secondary | ICD-10-CM | POA: Diagnosis not present

## 2023-12-28 DIAGNOSIS — I4821 Permanent atrial fibrillation: Secondary | ICD-10-CM

## 2023-12-28 DIAGNOSIS — J449 Chronic obstructive pulmonary disease, unspecified: Secondary | ICD-10-CM | POA: Insufficient documentation

## 2023-12-28 DIAGNOSIS — M549 Dorsalgia, unspecified: Secondary | ICD-10-CM | POA: Diagnosis not present

## 2023-12-28 DIAGNOSIS — R0602 Shortness of breath: Secondary | ICD-10-CM | POA: Diagnosis present

## 2023-12-28 DIAGNOSIS — I5042 Chronic combined systolic (congestive) and diastolic (congestive) heart failure: Secondary | ICD-10-CM

## 2023-12-28 DIAGNOSIS — S299XXA Unspecified injury of thorax, initial encounter: Secondary | ICD-10-CM | POA: Diagnosis present

## 2023-12-28 LAB — COMPREHENSIVE METABOLIC PANEL WITH GFR
ALT: 135 U/L — ABNORMAL HIGH (ref 0–44)
AST: 76 U/L — ABNORMAL HIGH (ref 15–41)
Albumin: 3.2 g/dL — ABNORMAL LOW (ref 3.5–5.0)
Alkaline Phosphatase: 138 U/L — ABNORMAL HIGH (ref 38–126)
Anion gap: 10 (ref 5–15)
BUN: 25 mg/dL — ABNORMAL HIGH (ref 8–23)
CO2: 33 mmol/L — ABNORMAL HIGH (ref 22–32)
Calcium: 9 mg/dL (ref 8.9–10.3)
Chloride: 99 mmol/L (ref 98–111)
Creatinine, Ser: 1.07 mg/dL (ref 0.61–1.24)
GFR, Estimated: >60 mL/min (ref >60)
Glucose, Bld: 173 mg/dL — ABNORMAL HIGH (ref 70–99)
Potassium: 3.7 mmol/L (ref 3.5–5.1)
Sodium: 142 mmol/L (ref 135–145)
Total Bilirubin: 1.3 mg/dL — ABNORMAL HIGH (ref 0.0–1.2)
Total Protein: 6.3 g/dL — ABNORMAL LOW (ref 6.5–8.1)

## 2023-12-28 LAB — CBC WITH DIFFERENTIAL/PLATELET
Abs Immature Granulocytes: 0.03 10*3/uL (ref 0.00–0.07)
Basophils Absolute: 0 10*3/uL (ref 0.0–0.1)
Basophils Relative: 1 %
Eosinophils Absolute: 0.1 10*3/uL (ref 0.0–0.5)
Eosinophils Relative: 3 %
HCT: 35 % — ABNORMAL LOW (ref 39.0–52.0)
Hemoglobin: 10.5 g/dL — ABNORMAL LOW (ref 13.0–17.0)
Immature Granulocytes: 1 %
Lymphocytes Relative: 11 %
Lymphs Abs: 0.5 10*3/uL — ABNORMAL LOW (ref 0.7–4.0)
MCH: 29.2 pg (ref 26.0–34.0)
MCHC: 30 g/dL (ref 30.0–36.0)
MCV: 97.5 fL (ref 80.0–100.0)
Monocytes Absolute: 0.4 10*3/uL (ref 0.1–1.0)
Monocytes Relative: 8 %
Neutro Abs: 3.4 10*3/uL (ref 1.7–7.7)
Neutrophils Relative %: 76 %
Platelets: 161 10*3/uL (ref 150–400)
RBC: 3.59 MIL/uL — ABNORMAL LOW (ref 4.22–5.81)
RDW: 17.5 % — ABNORMAL HIGH (ref 11.5–15.5)
WBC: 4.4 10*3/uL (ref 4.0–10.5)
nRBC: 0 % (ref 0.0–0.2)

## 2023-12-28 LAB — BRAIN NATRIURETIC PEPTIDE: B Natriuretic Peptide: 149 pg/mL — ABNORMAL HIGH (ref 0.0–100.0)

## 2023-12-28 MED ORDER — IOHEXOL 300 MG/ML  SOLN
100.0000 mL | Freq: Once | INTRAMUSCULAR | Status: AC | PRN
  Administered 2023-12-28: 100 mL via INTRAVENOUS

## 2023-12-28 MED ORDER — OXYCODONE-ACETAMINOPHEN 5-325 MG PO TABS: ORAL_TABLET | ORAL | 0 refills | Status: DC

## 2023-12-28 NOTE — Patient Instructions (Signed)
 Go across the street to [facility] for evaluation.

## 2023-12-28 NOTE — Assessment & Plan Note (Signed)
 Acute back pain with mechanical fall 2 weeks ago-continues to have ongoing acute pain with hematoma along mid to lower spine with extensive ecchymosis.  Patient is on Eliquis .  Will need further evaluation with imaging.  Referred to the emergency room.

## 2023-12-28 NOTE — ED Triage Notes (Signed)
 Pt arrived via POV from PCP Office for further evaluation of SOB and injury from a fall that Pt reports occurred 2 weeks ago. Pt presents with bruising to right lower back. Pt reports he felt lightheaded/dizzy after standing up in bathroom and fell backwards striking his tub.

## 2023-12-28 NOTE — ED Notes (Signed)
 ED Provider at bedside.

## 2023-12-28 NOTE — ED Notes (Signed)
 Pt transported to CT. Kellogg RN

## 2023-12-28 NOTE — Assessment & Plan Note (Signed)
Appears euvolemic on exam.  Continue on current regimen 

## 2023-12-28 NOTE — Assessment & Plan Note (Signed)
 Slow to resolve COPD exacerbation with ongoing cough and congestion.  Previously told that he had recent pneumonia after traumatic fall.  Will need further imaging and suspected CT chest.  Recommend continue on Trelegy.  Continue on albuterol  nebulizer.  May need additional antibiotics pending imaging Refer to the emergency room for ongoing evaluation as patient is high risk for decompensation

## 2023-12-28 NOTE — Progress Notes (Signed)
 @Patient  ID: Tommy Riley, male    DOB: [birth year], 78 y.o.   MRN: [id number]  Chief Complaint  Patient presents with   Follow-up    Acute - pt fell in the bathroom and now is experiencing shob    Referring provider: Marchelle Clem Pitts, *  HPI: 78 yo male former smoker followed for OSA, COPD, Chronic Respiratory Failure on O2.  Medical history significant for atrial fibrillation on chronic anticoagulation therapy, chronic kidney disease, coronary artery disease status post CABG, systolic congestive heart failure, diabetes, history of stroke  TEST/EVENTS :  04/28/2017 PFT: FVC 64, FEV1 46, ratio 55, TLC 82, DLCO 67 04/24/2020 HRCT chest: Scarring at bases 10/17/2021 PSG: AHI 8.5, SpO2 low 83% 03/18/2022 echo: EF 40 to 45%, severe LA dilation, moderate RA dilation, mild MR, moderate RV systolic dysfunction 04/05/2023 echo: EF 45 to 50%.  Moderate LVH.  RV size and function normal.  LA severely dilated.  Mild MR.  12/28/2023 Acute OV : SOB, Hx of COPD, O2 RF , OSA  Patient presents for an acute office visit.  Patient said about 2 weeks ago he had an episode of dizziness/presyncope when he was standing up after using the bathroom.  Patient fell backwards hitting the edge of the tub on his mid to lower right back.  Was unable to get up.  EMS came to the house to assist him to get up.  Patient went to local urgent care in [place] .  Says he was sent for x-rays of his back and chest was told that he had possible pneumonia and was given a Z-Pak.  Patient says he has not been getting any better.  Continues to have significant back pain.  He has severe bruising that is getting worse and going down into his lower back and hip area.  Patient is on Eliquis  for A-fib.  Patient continues to feel weak low energy and increased shortness of breath with minimal activity.  Patient remains on oxygen 3 L.  O2 saturations have been stable at home.  O2 saturations today in the office 94% on 3 L.   Appetite is good with no nausea vomiting diarrhea.  No hematuria or bloody stools.  No fever.  Continues to have ongoing cough and congestion.  No hemoptysis. Today in the office patient is very weak short of breath at rest.  States he did have some increased lower extremity swelling which she took an extra diuretic for a few days and this improved.  Allergies  Allergen Reactions   Codeine Anaphylaxis, Other (See Comments) and Shortness Of Breath    Tightness in chest  Other Reaction(s): chest pain  Tightness in chest    Other reaction(s): Other (See Comments)  Tightness in chest  Tightness in chest  Tightness in chest    Immunization History  Administered Date(s) Administered   Fluad Trivalent(High Dose 65+) 05/09/2015   Influenza, High Dose Seasonal PF 08/16/2015, 04/30/2016, 04/21/2017, 05/10/2017, 04/25/2018, 05/03/2018   Influenza,inj,Quad PF,6+ Mos 05/08/2015   Influenza,inj,Quad PF,6-35 Mos 05/07/2019   Influenza,inj,quad, With Preservative 04/30/2016, 04/22/2017, 04/07/2019, 04/05/2020   Influenza-Unspecified 04/14/2014, 04/21/2017, 05/29/2018, 05/24/2023   Moderna Sars-Covid-2 Vaccination 07/29/2019, 08/26/2019, 03/25/2020   Pneumococcal Conjugate-13 06/22/2013, 04/14/2014, 08/16/2015   Pneumococcal Polysaccharide-23 06/13/2013, 03/25/2015   Tdap 12/17/2016, 03/15/2017   Zoster Recombinant(Shingrix) 03/15/2017, 03/29/2017   Zoster, Live 03/04/2011, 03/25/2017   Zoster, Unspecified 03/25/2017    Past Medical History:  Diagnosis Date   Anxiety    Atrial fibrillation and flutter (HCC)  Atrial fibrillation ablation 2011 at [facility]   CAD in native artery    Chronic systolic heart failure (HCC)    CKD (chronic kidney disease), stage III (HCC)    COPD (chronic obstructive pulmonary disease) (HCC)    COVID-22 February 2020   Depression    Essential hypertension    Gout    Hyperlipidemia    Ischemic heart disease    OSA (obstructive sleep apnea)    Stroke (HCC)  2003   Type 2 diabetes mellitus (HCC)     Tobacco History: Social History   Tobacco Use  Smoking Status Some Days   Current packs/day: 0.00   Average packs/day: 2.5 packs/day for 50.0 years (125.0 ttl pk-yrs)   Types: E-cigarettes, Cigarettes   Start date: 06/16/1961   Last attempt to quit: 06/17/2011   Years since quitting: 12.5   Passive exposure: Past  Smokeless Tobacco Never   Ready to quit: Not Answered Counseling given: Not Answered   Outpatient Medications Prior to Visit  Medication Sig Dispense Refill   ACCU-CHEK AVIVA PLUS test strip      albuterol  (PROVENTIL ) (2.5 MG/3ML) 0.083% nebulizer solution Take 3 mLs (2.5 mg total) by nebulization every 6 (six) hours as needed for wheezing or shortness of breath. 75 mL 4   albuterol  (VENTOLIN  HFA) 108 (90 Base) MCG/ACT inhaler USE 2 INHALATIONS BY MOUTH EVERY 4 HOURS AS NEEDED FOR SHORTNESS  OF BREATH OR WHEEZE 108 g 5   allopurinol  (ZYLOPRIM ) 100 MG tablet Taking two tablets by mouth in the morning and 1 tablet by mouth in the evening     apixaban  (ELIQUIS ) 5 MG TABS tablet TAKE 1 TABLET BY MOUTH TWICE  DAILY 200 tablet 1   Ascorbic Acid  (VITAMIN C PO) Take 2,000 mg by mouth daily.     aspirin  81 MG EC tablet Take 81 mg by mouth in the morning.     Blood Glucose Monitoring Suppl (ONE TOUCH ULTRA 2) W/DEVICE KIT by Does not apply route.     Cholecalciferol  (VITAMIN D3) 2000 UNITS TABS Take 1 capsule by mouth in the morning and at bedtime.     Continuous Glucose Sensor (DEXCOM G7 SENSOR) MISC See admin instructions.     dapagliflozin  propanediol (FARXIGA ) 10 MG TABS tablet Take 1 tablet (10 mg total) by mouth daily. 90 tablet 3   diphenhydramine -acetaminophen  (TYLENOL  PM) 25-500 MG TABS tablet Take 2 tablets by mouth at bedtime.     ezetimibe  (ZETIA ) 10 MG tablet Take 10 mg by mouth in the morning.     fluticasone  (FLONASE ) 50 MCG/ACT nasal spray Place 1 spray into both nostrils daily. 16 g 2   Fluticasone -Umeclidin-Vilant  (TRELEGY ELLIPTA ) 100-62.5-25 MCG/ACT AEPB Inhale 1 puff into the lungs daily. 90 each 1   folic acid  (FOLVITE ) 400 MCG tablet Take 400 mcg by mouth every evening.     HUMALOG  KWIKPEN 200 UNIT/ML KwikPen 20 Units as needed. On a sliding scale     insulin  aspart (NOVOLOG  FLEXPEN) 100 UNIT/ML FlexPen As directed twice daily     insulin  degludec (TRESIBA) 100 UNIT/ML FlexTouch Pen Inject 50 Units into the skin 2 (two) times daily.     loratadine  (CLARITIN ) 10 MG tablet Take 10 mg by mouth in the morning.     losartan  (COZAAR ) 50 MG tablet TAKE 1 TABLET BY MOUTH DAILY 100 tablet 2   melatonin 5 MG TABS Take 5 mg by mouth at bedtime.     metoprolol  (TOPROL -XL) 200 MG 24  hr tablet TAKE 1 TABLET BY MOUTH DAILY  WITH OR IMMEDIATELY FOLLOWING A  MEAL 100 tablet 2   Multiple Vitamins-Minerals (CENTRUM CARDIO) TABS Take 1 tablet by mouth in the morning.     omega-3 acid ethyl esters (LOVAZA ) 1 g capsule Take 2 g by mouth 2 (two) times daily.     oxymetazoline  (AFRIN) 0.05 % nasal spray Place 1 spray into both nostrils as needed for congestion.     OZEMPIC, 0.25 OR 0.5 MG/DOSE, 2 MG/3ML SOPN Inject 0.5 Units into the skin once a week. On Fridays     potassium chloride  SA (KLOR-CON  M) 20 MEQ tablet TAKE 3 TABLETS BY MOUTH 3 TIMES  DAILY 810 tablet 3   RELION PEN NEEDLE 31G/8MM 31G X 8 MM MISC 3 (three) times daily.     rosuvastatin  (CRESTOR ) 40 MG tablet TAKE 1 TABLET BY MOUTH DAILY 100 tablet 2   sertraline  (ZOLOFT ) 100 MG tablet Take 150 mg by mouth in the morning.     spironolactone  (ALDACTONE ) 25 MG tablet TAKE 1 TABLET BY MOUTH DAILY 100 tablet 2   torsemide  (DEMADEX ) 20 MG tablet Take 1 tablet (20 mg total) by mouth as directed. TAKE 4 TABLETS BY MOUTH TWICE  DAILY MAY TAKE AN ADDITIONAL 1  TABLET BY MOUTH DAILY AS NEEDED  FOR 2 TO 3 LBS WEIGHT GAIN IN 1  DAY OR 5 LBS IN 1 WEEK 765 tablet 1   zinc  sulfate 220 (50 Zn) MG capsule Take 220 mg by mouth every evening.     No facility-administered  medications prior to visit.     Review of Systems:   Constitutional:   No  weight loss, night sweats,  Fevers, chills,+ fatigue, or  lassitude.  HEENT:   No headaches,  Difficulty swallowing,  Tooth/dental problems, or  Sore throat,                No sneezing, itching, ear ache, nasal congestion, post nasal drip,   CV:  No chest pain,  Orthopnea, PND, swelling in lower extremities, anasarca, dizziness, palpitations, syncope.   GI  No heartburn, indigestion, abdominal pain, nausea, vomiting, diarrhea, change in bowel habits, loss of appetite, bloody stools.   Resp:   No chest wall deformity  Skin: bruising.   GU: no dysuria, change in color of urine, no urgency or frequency.  No flank pain, no hematuria   MS:  +back pain     Physical Exam  BP (!) 113/98   Pulse 75   Ht 5' 11 (1.803 m)   Wt 263 lb (119.3 kg)   SpO2 94% Comment: 3 L POC  BMI 36.68 kg/m   GEN: A/Ox3; pleasant , chronically ill-appearing, on oxygen, cane   HEENT:  [place]/AT,  NOSE-clear, THROAT-clear, no lesions, no postnasal drip or exudate noted.   NECK:  Supple w/ fair ROM; no JVD; normal carotid impulses w/o bruits; no thyromegaly or nodules palpated; no lymphadenopathy.    RESP decreased breath sounds in the bases, scattered rhonchi  no accessory muscle use, no dullness to percussion  CARD:  RRR, no m/r/g, tr peripheral edema, pulses intact, no cyanosis or clubbing.  GI:   Soft & nt; nml bowel sounds; no organomegaly or masses detected.   Musco: Warm bil, no deformities or joint swelling noted.  Bruising along the mid to lower back with hematoma mid to lower thoracic spine  Neuro: alert, no focal deficits noted.    Skin: Warm, extensive ecchymosis along the right lower  back and flank area.  Hematoma along the mid lower back/thoracic spine    Lab Results:  CBC    Component Value Date/Time   WBC 5.2 09/15/2023 1126   RBC 4.82 09/15/2023 1126   HGB 13.8 09/15/2023 1126   HGB 13.7 09/01/2023  1058   HCT 45.1 09/15/2023 1126   HCT 42.7 09/01/2023 1058   PLT 135 (L) 09/15/2023 1126   PLT 131 (L) 09/01/2023 1058   MCV 93.6 09/15/2023 1126   MCV 90 09/01/2023 1058   MCH 28.6 09/15/2023 1126   MCHC 30.6 09/15/2023 1126   RDW 15.6 (H) 09/15/2023 1126   RDW 14.7 09/01/2023 1058   LYMPHSABS 0.6 (L) 08/28/2021 1821   LYMPHSABS 0.7 08/21/2021 1549   MONOABS 0.5 08/28/2021 1821   EOSABS 0.1 08/28/2021 1821   EOSABS 0.1 08/21/2021 1549   BASOSABS 0.0 08/28/2021 1821   BASOSABS 0.0 08/21/2021 1549    BMET    Component Value Date/Time   NA 149 (H) 09/01/2023 1058   K 4.3 09/01/2023 1058   CL 102 09/01/2023 1058   CO2 32 (H) 09/01/2023 1058   GLUCOSE 89 09/01/2023 1058   GLUCOSE 142 (H) 08/09/2023 1412   BUN 29 (H) 09/01/2023 1058   CREATININE 1.31 (H) 09/01/2023 1058   CALCIUM  9.4 09/01/2023 1058   GFRNONAA >60 08/09/2023 1412    BNP    Component Value Date/Time   BNP 258.7 (H) 09/15/2021 1511    ProBNP    Component Value Date/Time   PROBNP 353.0 (H) 08/26/2021 1239    Imaging: No results found.  Administration History     None           No data to display          No results found for: NITRICOXIDE      Assessment & Plan:   Back pain due to injury Acute back pain with mechanical fall 2 weeks ago-continues to have ongoing acute pain with hematoma along mid to lower spine with extensive ecchymosis.  Patient is on Eliquis .  Will need further evaluation with imaging.  Referred to the emergency room.  COPD (chronic obstructive pulmonary disease) (HCC) Slow to resolve COPD exacerbation with ongoing cough and congestion.  Previously told that he had recent pneumonia after traumatic fall.  Will need further imaging and suspected CT chest.  Recommend continue on Trelegy.  Continue on albuterol  nebulizer.  May need additional antibiotics pending imaging Refer to the emergency room for ongoing evaluation as patient is high risk for  decompensation  Chronic respiratory failure with hypoxia (HCC) Appears compensated on oxygen at 3 L.  CHF (congestive heart failure) (HCC) Appears euvolemic on exam.  Continue on current regimen  Permanent atrial fibrillation (HCC) On chronic anticoagulation with Eliquis .  Patient has extensive ecchymosis along right flank after mechanical fall 2 weeks ago will need labs to rule out bleed.   Report called to ER -[facility]mon Triage/Charge RN   Plan  Patient Instructions  Go across the street to [facility] ER for evaluation.    I spent   40 minutes dedicated to the care of this patient on the date of this encounter to include pre-visit review of records, face-to-face time with the patient discussing conditions above, post visit ordering of testing, clinical documentation with the electronic health record, making appropriate referrals as documented, and communicating necessary findings to members of the patients care team.   Madelin Stank, NP 12/28/2023

## 2023-12-28 NOTE — Discharge Instructions (Addendum)
 Follow-up with your family doctor or your lung doctor in the next couple weeks for your broken ribs.  Follow-up with the neurosurgeon we have referred you to in the next couple weeks for your back

## 2023-12-28 NOTE — Assessment & Plan Note (Signed)
 On chronic anticoagulation with Eliquis .  Patient has extensive ecchymosis along right flank after mechanical fall 2 weeks ago will need labs to rule out bleed.

## 2023-12-28 NOTE — Assessment & Plan Note (Signed)
 Appears compensated on oxygen at 3 L.

## 2023-12-30 NOTE — ED Provider Notes (Signed)
  EMERGENCY DEPARTMENT AT [facility] Provider Note   CSN: [id number] Arrival date & time: 12/28/23  9051     Patient presents with: Tommy Riley   Tommy Riley is a 78 y.o. male.   Patient has a history of COPD.  Had a fall few days ago.  Patient has significant bruising to his buttocks.  Patient is on Eliquis .  He was sent over here from the pulmonary office for evaluation  The history is provided by the patient and medical records. No language interpreter was used.  Fall This is a new problem. The current episode started more than 2 days ago. The problem occurs rarely. The problem has been resolved. Associated symptoms include chest pain. Pertinent negatives include no abdominal pain and no headaches. Nothing aggravates the symptoms. Nothing relieves the symptoms. He has tried nothing for the symptoms.       Prior to Admission medications   Medication Sig Start Date End Date Taking? Authorizing Provider  oxyCODONE-acetaminophen  (PERCOCET/ROXICET) 5-325 MG tablet Take 1 every 6 hours for pain that is not relieved by Tylenol . 12/28/23  Yes Suzette Pac, MD  ACCU-CHEK AVIVA PLUS test strip  07/09/14   [provider]  albuterol  (PROVENTIL ) (2.5 MG/3ML) 0.083% nebulizer solution Take 3 mLs (2.5 mg total) by nebulization every 6 (six) hours as needed for wheezing or shortness of breath. 08/26/21   Hope Almarie ORN, NP  albuterol  (VENTOLIN  HFA) 108 (90 Base) MCG/ACT inhaler USE 2 INHALATIONS BY MOUTH EVERY 4 HOURS AS NEEDED FOR SHORTNESS  OF BREATH OR WHEEZE 10/19/23   Cobb, Alyssa L, NP  allopurinol  (ZYLOPRIM ) 100 MG tablet Taking two tablets by mouth in the morning and 1 tablet by mouth in the evening    [provider]  apixaban  (ELIQUIS ) 5 MG TABS tablet TAKE 1 TABLET BY MOUTH TWICE  DAILY 09/29/23   Debera Jayson MATSU, MD  Ascorbic Acid  (VITAMIN C PO) Take 2,000 mg by mouth daily.    [provider]  aspirin  81 MG EC tablet Take 81 mg by mouth  in the morning.    [provider]  Blood Glucose Monitoring Suppl (ONE TOUCH ULTRA 2) W/DEVICE KIT by Does not apply route.    [provider]  Cholecalciferol  (VITAMIN D3) 2000 UNITS TABS Take 1 capsule by mouth in the morning and at bedtime.    [provider]  Continuous Glucose Sensor (DEXCOM G7 SENSOR) MISC See admin instructions. 06/21/23   [provider]  dapagliflozin  propanediol (FARXIGA ) 10 MG TABS tablet Take 1 tablet (10 mg total) by mouth daily. 10/28/23   Debera Jayson MATSU, MD  diphenhydramine -acetaminophen  (TYLENOL  PM) 25-500 MG TABS tablet Take 2 tablets by mouth at bedtime.    [provider]  ezetimibe  (ZETIA ) 10 MG tablet Take 10 mg by mouth in the morning.    [provider]  fluticasone  (FLONASE ) 50 MCG/ACT nasal spray Place 1 spray into both nostrils daily. 01/11/23   Sood, Vineet, MD  Fluticasone -Umeclidin-Vilant (TRELEGY ELLIPTA ) 100-62.5-25 MCG/ACT AEPB Inhale 1 puff into the lungs daily. 08/06/23   Cobb, Comer GAILS, NP  folic acid  (FOLVITE ) 400 MCG tablet Take 400 mcg by mouth every evening.    [provider]  HUMALOG  KWIKPEN 200 UNIT/ML KwikPen 20 Units as needed. On a sliding scale 09/12/21   [provider]  insulin  aspart (NOVOLOG  FLEXPEN) 100 UNIT/ML FlexPen As directed twice daily 08/09/23   [provider]  insulin  degludec (TRESIBA) 100 UNIT/ML FlexTouch Pen  Inject 50 Units into the skin 2 (two) times daily.    [provider]  loratadine  (CLARITIN ) 10 MG tablet Take 10 mg by mouth in the morning.    [provider]  losartan  (COZAAR ) 50 MG tablet TAKE 1 TABLET BY MOUTH DAILY 07/23/23   Debera Jayson MATSU, MD  melatonin 5 MG TABS Take 5 mg by mouth at bedtime.    [provider]  metoprolol  (TOPROL -XL) 200 MG 24 hr tablet TAKE 1 TABLET BY MOUTH DAILY  WITH OR IMMEDIATELY FOLLOWING A  MEAL 08/02/23   Debera Jayson MATSU, MD  Multiple Vitamins-Minerals (CENTRUM  CARDIO) TABS Take 1 tablet by mouth in the morning.    [provider]  omega-3 acid ethyl esters (LOVAZA ) 1 g capsule Take 2 g by mouth 2 (two) times daily.    [provider]  oxymetazoline  (AFRIN) 0.05 % nasal spray Place 1 spray into both nostrils as needed for congestion.    [provider]  OZEMPIC, 0.25 OR 0.5 MG/DOSE, 2 MG/3ML SOPN Inject 0.5 Units into the skin once a week. On Fridays 10/16/22   [provider]  potassium chloride  SA (KLOR-CON  M) 20 MEQ tablet TAKE 3 TABLETS BY MOUTH 3 TIMES  DAILY 06/01/23   Debera Jayson MATSU, MD  RELION PEN NEEDLE 31G/8MM 31G X 8 MM MISC 3 (three) times daily. 08/31/21   [provider]  rosuvastatin  (CRESTOR ) 40 MG tablet TAKE 1 TABLET BY MOUTH DAILY 12/21/23   Debera Jayson MATSU, MD  sertraline  (ZOLOFT ) 100 MG tablet Take 150 mg by mouth in the morning. 08/04/21   [provider]  spironolactone  (ALDACTONE ) 25 MG tablet TAKE 1 TABLET BY MOUTH DAILY 12/21/23   Debera Jayson MATSU, MD  torsemide  (DEMADEX ) 20 MG tablet Take 1 tablet (20 mg total) by mouth as directed. TAKE 4 TABLETS BY MOUTH TWICE  DAILY MAY TAKE AN ADDITIONAL 1  TABLET BY MOUTH DAILY AS NEEDED  FOR 2 TO 3 LBS WEIGHT GAIN IN 1  DAY OR 5 LBS IN 1 WEEK 12/24/23   Debera Jayson MATSU, MD  zinc  sulfate 220 (50 Zn) MG capsule Take 220 mg by mouth every evening.    [provider]    Allergies: Codeine    Review of Systems  Constitutional:  Negative for appetite change and fatigue.  HENT:  Negative for congestion, ear discharge and sinus pressure.   Eyes:  Negative for discharge.  Respiratory:  Negative for cough.   Cardiovascular:  Positive for chest pain.  Gastrointestinal:  Negative for abdominal pain and diarrhea.  Genitourinary:  Negative for frequency and hematuria.  Musculoskeletal:  Negative for back pain.  Skin:  Negative for rash.  Neurological:  Negative for seizures and headaches.  Psychiatric/Behavioral:  Negative for  hallucinations.     Updated Vital Signs BP (!) 150/81 (BP Location: Right Arm)   Pulse 70   Temp 98.4 F (36.9 C) (Oral)   Resp 18   Ht 5' 11 (1.803 m)   Wt 119.3 kg   SpO2 100%   BMI 36.68 kg/m   Physical Exam Vitals and nursing note reviewed.  Constitutional:      Appearance: He is well-developed.  HENT:     Head: Normocephalic.     Nose: Nose normal.   Eyes:     General: No scleral icterus.    Conjunctiva/sclera: Conjunctivae normal.   Neck:     Thyroid : No thyromegaly.   Cardiovascular:     Rate and  Rhythm: Normal rate and regular rhythm.     Heart sounds: No murmur heard.    No friction rub. No gallop.  Pulmonary:     Breath sounds: No stridor. No wheezing or rales.  Chest:     Chest wall: No tenderness.  Abdominal:     General: There is no distension.     Tenderness: There is no abdominal tenderness. There is no rebound.  Genitourinary:    Comments: Bruising around his lower back  Musculoskeletal:        General: Normal range of motion.     Cervical back: Neck supple.     Comments: Tenderness to right posterior ribs  Lymphadenopathy:     Cervical: No cervical adenopathy.   Skin:    Findings: No erythema or rash.   Neurological:     Mental Status: He is alert and oriented to person, place, and time.     Motor: No abnormal muscle tone.     Coordination: Coordination normal.   Psychiatric:        Behavior: Behavior normal.     (all labs ordered are listed, but only abnormal results are displayed) Labs Reviewed  CBC WITH DIFFERENTIAL/PLATELET - Abnormal; Notable for the following components:      Result Value   RBC 3.59 (*)    Hemoglobin 10.5 (*)    HCT 35.0 (*)    RDW 17.5 (*)    Lymphs Abs 0.5 (*)    All other components within normal limits  COMPREHENSIVE METABOLIC PANEL WITH GFR - Abnormal; Notable for the following components:   CO2 33 (*)    Glucose, Bld 173 (*)    BUN 25 (*)    Total Protein 6.3 (*)    Albumin 3.2 (*)    AST 76  (*)    ALT 135 (*)    Alkaline Phosphatase 138 (*)    Total Bilirubin 1.3 (*)    All other components within normal limits  BRAIN NATRIURETIC PEPTIDE - Abnormal; Notable for the following components:   B Natriuretic Peptide 149.0 (*)    All other components within normal limits    EKG: None  Radiology: No results found.   Procedures   Medications Ordered in the ED  iohexol  (OMNIPAQUE ) 300 MG/ML solution 100 mL (100 mLs Intravenous Contrast Given 12/28/23 1146)                                    Medical Decision Making Amount and/or Complexity of Data Reviewed Labs: ordered. Radiology: ordered.  Risk Prescription drug management.   Patient with multiple rib fractures but oxygenation in the 90s.  Also he has a transverse process fracture of the L1 and L2.  Patient neurologically normal.  He is referred to orthopedics and will follow-up with pulmonary Dr. Is given pain medicines     Final diagnoses:  Fall, initial encounter  Closed fracture of multiple ribs of right side, initial encounter    ED Discharge Orders          Ordered    oxyCODONE-acetaminophen  (PERCOCET/ROXICET) 5-325 MG tablet        12/28/23 1455               Suzette Pac, MD 12/30/23 1322

## 2023-12-31 ENCOUNTER — Telehealth: Payer: Self-pay

## 2023-12-31 ENCOUNTER — Other Ambulatory Visit: Payer: Self-pay

## 2023-12-31 MED ORDER — ALBUTEROL SULFATE (2.5 MG/3ML) 0.083% IN NEBU
2.5000 mg | INHALATION_SOLUTION | Freq: Four times a day (QID) | RESPIRATORY_TRACT | 4 refills | Status: DC | PRN
Start: 2023-12-31 — End: 2023-12-31

## 2023-12-31 MED ORDER — ALBUTEROL SULFATE (2.5 MG/3ML) 0.083% IN NEBU: 2.5000 mg | INHALATION_SOLUTION | Freq: Four times a day (QID) | RESPIRATORY_TRACT | 4 refills | Status: DC | PRN

## 2023-12-31 NOTE — Telephone Encounter (Signed)
 Sent refill rx to pharmacy [facility]

## 2023-12-31 NOTE — Telephone Encounter (Signed)
 Copied from CRM [id number]. Topic: Clinical - Prescription Issue >> Dec 30, 2023  8:09 AM Benton KIDD wrote: Reason for CRM: patient wife is calling cause patient seen tammy parrett and she was suppose to send in a medication solution for the nebulizer patient wife says pharmacy doesn't have it and would like for tammy parrett to send it in  [id number] - [place], [place] - [address] [place] [address] Phone: [phone] Fax: [phone] Hours: Not open 24 hours

## 2024-01-09 ENCOUNTER — Inpatient Hospital Stay (HOSPITAL_COMMUNITY)
Admission: EM | Admit: 2024-01-09 | Discharge: 2024-01-18 | DRG: 683 | Disposition: A | Discharge status: Skilled Nursing Facility | Attending: Internal Medicine | Admitting: Internal Medicine

## 2024-01-09 ENCOUNTER — Other Ambulatory Visit: Payer: Self-pay

## 2024-01-09 ENCOUNTER — Emergency Department (HOSPITAL_COMMUNITY)

## 2024-01-09 ENCOUNTER — Encounter (HOSPITAL_COMMUNITY): Payer: Self-pay | Admitting: *Deleted

## 2024-01-09 DIAGNOSIS — Z951 Presence of aortocoronary bypass graft: Secondary | ICD-10-CM

## 2024-01-09 DIAGNOSIS — Z79899 Other long term (current) drug therapy: Secondary | ICD-10-CM

## 2024-01-09 DIAGNOSIS — Z794 Long term (current) use of insulin: Secondary | ICD-10-CM | POA: Diagnosis not present

## 2024-01-09 DIAGNOSIS — E66812 Obesity, class 2: Secondary | ICD-10-CM | POA: Diagnosis present

## 2024-01-09 DIAGNOSIS — Z8673 Personal history of transient ischemic attack (TIA), and cerebral infarction without residual deficits: Secondary | ICD-10-CM

## 2024-01-09 DIAGNOSIS — Z7982 Long term (current) use of aspirin: Secondary | ICD-10-CM | POA: Diagnosis not present

## 2024-01-09 DIAGNOSIS — E1151 Type 2 diabetes mellitus with diabetic peripheral angiopathy without gangrene: Secondary | ICD-10-CM | POA: Diagnosis present

## 2024-01-09 DIAGNOSIS — E876 Hypokalemia: Secondary | ICD-10-CM | POA: Diagnosis present

## 2024-01-09 DIAGNOSIS — D62 Acute posthemorrhagic anemia: Secondary | ICD-10-CM | POA: Diagnosis present

## 2024-01-09 DIAGNOSIS — K7469 Other cirrhosis of liver: Secondary | ICD-10-CM | POA: Diagnosis present

## 2024-01-09 DIAGNOSIS — D649 Anemia, unspecified: Secondary | ICD-10-CM | POA: Diagnosis not present

## 2024-01-09 DIAGNOSIS — S8002XA Contusion of left knee, initial encounter: Secondary | ICD-10-CM | POA: Diagnosis present

## 2024-01-09 DIAGNOSIS — Z8616 Personal history of COVID-19: Secondary | ICD-10-CM

## 2024-01-09 DIAGNOSIS — M109 Gout, unspecified: Secondary | ICD-10-CM | POA: Diagnosis present

## 2024-01-09 DIAGNOSIS — Z7901 Long term (current) use of anticoagulants: Secondary | ICD-10-CM

## 2024-01-09 DIAGNOSIS — E1122 Type 2 diabetes mellitus with diabetic chronic kidney disease: Secondary | ICD-10-CM | POA: Diagnosis present

## 2024-01-09 DIAGNOSIS — I480 Paroxysmal atrial fibrillation: Secondary | ICD-10-CM | POA: Diagnosis present

## 2024-01-09 DIAGNOSIS — S8012XA Contusion of left lower leg, initial encounter: Secondary | ICD-10-CM | POA: Diagnosis not present

## 2024-01-09 DIAGNOSIS — I130 Hypertensive heart and chronic kidney disease with heart failure and stage 1 through stage 4 chronic kidney disease, or unspecified chronic kidney disease: Secondary | ICD-10-CM | POA: Diagnosis present

## 2024-01-09 DIAGNOSIS — J449 Chronic obstructive pulmonary disease, unspecified: Secondary | ICD-10-CM | POA: Diagnosis present

## 2024-01-09 DIAGNOSIS — I2510 Atherosclerotic heart disease of native coronary artery without angina pectoris: Secondary | ICD-10-CM | POA: Diagnosis present

## 2024-01-09 DIAGNOSIS — Z833 Family history of diabetes mellitus: Secondary | ICD-10-CM

## 2024-01-09 DIAGNOSIS — S7012XA Contusion of left thigh, initial encounter: Secondary | ICD-10-CM | POA: Diagnosis present

## 2024-01-09 DIAGNOSIS — W19XXXA Unspecified fall, initial encounter: Secondary | ICD-10-CM | POA: Diagnosis present

## 2024-01-09 DIAGNOSIS — Z7985 Long-term (current) use of injectable non-insulin antidiabetic drugs: Secondary | ICD-10-CM

## 2024-01-09 DIAGNOSIS — G4733 Obstructive sleep apnea (adult) (pediatric): Secondary | ICD-10-CM | POA: Diagnosis present

## 2024-01-09 DIAGNOSIS — N179 Acute kidney failure, unspecified: Principal | ICD-10-CM | POA: Diagnosis present

## 2024-01-09 DIAGNOSIS — Y92002 Bathroom of unspecified non-institutional (private) residence single-family (private) house as the place of occurrence of the external cause: Secondary | ICD-10-CM | POA: Diagnosis not present

## 2024-01-09 DIAGNOSIS — S8002XD Contusion of left knee, subsequent encounter: Secondary | ICD-10-CM | POA: Diagnosis not present

## 2024-01-09 DIAGNOSIS — R296 Repeated falls: Secondary | ICD-10-CM | POA: Diagnosis present

## 2024-01-09 DIAGNOSIS — Z6839 Body mass index (BMI) 39.0-39.9, adult: Secondary | ICD-10-CM

## 2024-01-09 DIAGNOSIS — J9611 Chronic respiratory failure with hypoxia: Secondary | ICD-10-CM | POA: Diagnosis present

## 2024-01-09 DIAGNOSIS — R531 Weakness: Secondary | ICD-10-CM | POA: Diagnosis present

## 2024-01-09 DIAGNOSIS — Z885 Allergy status to narcotic agent status: Secondary | ICD-10-CM

## 2024-01-09 DIAGNOSIS — K7460 Unspecified cirrhosis of liver: Secondary | ICD-10-CM | POA: Diagnosis not present

## 2024-01-09 DIAGNOSIS — N182 Chronic kidney disease, stage 2 (mild): Secondary | ICD-10-CM | POA: Diagnosis present

## 2024-01-09 DIAGNOSIS — I5032 Chronic diastolic (congestive) heart failure: Secondary | ICD-10-CM | POA: Diagnosis present

## 2024-01-09 DIAGNOSIS — Z8249 Family history of ischemic heart disease and other diseases of the circulatory system: Secondary | ICD-10-CM

## 2024-01-09 DIAGNOSIS — F17210 Nicotine dependence, cigarettes, uncomplicated: Secondary | ICD-10-CM | POA: Diagnosis present

## 2024-01-09 DIAGNOSIS — M25562 Pain in left knee: Secondary | ICD-10-CM | POA: Diagnosis present

## 2024-01-09 LAB — CBC WITH DIFFERENTIAL/PLATELET
Abs Immature Granulocytes: 0.07 K/uL (ref 0.00–0.07)
Basophils Absolute: 0 K/uL (ref 0.0–0.1)
Basophils Relative: 0 %
Eosinophils Absolute: 0.1 K/uL (ref 0.0–0.5)
Eosinophils Relative: 2 %
HCT: 28.3 % — ABNORMAL LOW (ref 39.0–52.0)
Hemoglobin: 8.2 g/dL — ABNORMAL LOW (ref 13.0–17.0)
Immature Granulocytes: 1 %
Lymphocytes Relative: 14 %
Lymphs Abs: 0.8 K/uL (ref 0.7–4.0)
MCH: 29.1 pg (ref 26.0–34.0)
MCHC: 29 g/dL — ABNORMAL LOW (ref 30.0–36.0)
MCV: 100.4 fL — ABNORMAL HIGH (ref 80.0–100.0)
Monocytes Absolute: 0.5 K/uL (ref 0.1–1.0)
Monocytes Relative: 8 %
Neutro Abs: 4.4 K/uL (ref 1.7–7.7)
Neutrophils Relative %: 75 %
Platelets: 138 K/uL — ABNORMAL LOW (ref 150–400)
RBC: 2.82 MIL/uL — ABNORMAL LOW (ref 4.22–5.81)
RDW: 17.3 % — ABNORMAL HIGH (ref 11.5–15.5)
WBC: 5.9 K/uL (ref 4.0–10.5)
nRBC: 0 % (ref 0.0–0.2)

## 2024-01-09 LAB — URINALYSIS, ROUTINE W REFLEX MICROSCOPIC
Bacteria, UA: NONE SEEN
Bilirubin Urine: NEGATIVE
Glucose, UA: >=500 mg/dL — AB
Hgb urine dipstick: NEGATIVE
Ketones, ur: NEGATIVE mg/dL
Leukocytes,Ua: NEGATIVE
Nitrite: NEGATIVE
Protein, ur: NEGATIVE mg/dL
Specific Gravity, Urine: 1.011 (ref 1.005–1.030)
pH: 6 (ref 5.0–8.0)

## 2024-01-09 LAB — HEPATIC FUNCTION PANEL
ALT: 95 U/L — ABNORMAL HIGH (ref 0–44)
AST: 67 U/L — ABNORMAL HIGH (ref 15–41)
Albumin: 3.1 g/dL — ABNORMAL LOW (ref 3.5–5.0)
Alkaline Phosphatase: 103 U/L (ref 38–126)
Bilirubin, Direct: 0.1 mg/dL (ref 0.0–0.2)
Indirect Bilirubin: 0.7 mg/dL (ref 0.3–0.9)
Total Bilirubin: 0.8 mg/dL (ref 0.0–1.2)
Total Protein: 5.6 g/dL — ABNORMAL LOW (ref 6.5–8.1)

## 2024-01-09 LAB — TSH: TSH: 2.211 u[IU]/mL (ref 0.350–4.500)

## 2024-01-09 LAB — COMPREHENSIVE METABOLIC PANEL WITH GFR
ALT: 94 U/L — ABNORMAL HIGH (ref 0–44)
AST: 65 U/L — ABNORMAL HIGH (ref 15–41)
Albumin: 3.1 g/dL — ABNORMAL LOW (ref 3.5–5.0)
Alkaline Phosphatase: 104 U/L (ref 38–126)
Anion gap: 11 (ref 5–15)
BUN: 27 mg/dL — ABNORMAL HIGH (ref 8–23)
CO2: 31 mmol/L (ref 22–32)
Calcium: 8.6 mg/dL — ABNORMAL LOW (ref 8.9–10.3)
Chloride: 100 mmol/L (ref 98–111)
Creatinine, Ser: 1.54 mg/dL — ABNORMAL HIGH (ref 0.61–1.24)
GFR, Estimated: 47 mL/min — ABNORMAL LOW (ref >60)
Glucose, Bld: 195 mg/dL — ABNORMAL HIGH (ref 70–99)
Potassium: 5.2 mmol/L — ABNORMAL HIGH (ref 3.5–5.1)
Sodium: 142 mmol/L (ref 135–145)
Total Bilirubin: 0.9 mg/dL (ref 0.0–1.2)
Total Protein: 5.5 g/dL — ABNORMAL LOW (ref 6.5–8.1)

## 2024-01-09 LAB — BASIC METABOLIC PANEL WITH GFR
Anion gap: 12 (ref 5–15)
BUN: 28 mg/dL — ABNORMAL HIGH (ref 8–23)
CO2: 31 mmol/L (ref 22–32)
Calcium: 8.5 mg/dL — ABNORMAL LOW (ref 8.9–10.3)
Chloride: 99 mmol/L (ref 98–111)
Creatinine, Ser: 1.55 mg/dL — ABNORMAL HIGH (ref 0.61–1.24)
GFR, Estimated: 46 mL/min — ABNORMAL LOW (ref >60)
Glucose, Bld: 194 mg/dL — ABNORMAL HIGH (ref 70–99)
Potassium: 5.1 mmol/L (ref 3.5–5.1)
Sodium: 142 mmol/L (ref 135–145)

## 2024-01-09 LAB — GLUCOSE, CAPILLARY
Glucose-Capillary: 141 mg/dL — ABNORMAL HIGH (ref 70–99)
Glucose-Capillary: 170 mg/dL — ABNORMAL HIGH (ref 70–99)

## 2024-01-09 LAB — RETICULOCYTES
Immature Retic Fract: 32.8 % — ABNORMAL HIGH (ref 2.3–15.9)
RBC.: 2.85 MIL/uL — ABNORMAL LOW (ref 4.22–5.81)
Retic Count, Absolute: 75.8 K/uL (ref 19.0–186.0)
Retic Ct Pct: 2.7 % (ref 0.4–3.1)

## 2024-01-09 LAB — IRON AND TIBC
Iron: 57 ug/dL (ref 45–182)
Saturation Ratios: 15 % — ABNORMAL LOW (ref 17.9–39.5)
TIBC: 372 ug/dL (ref 250–450)
UIBC: 315 ug/dL

## 2024-01-09 LAB — POC OCCULT BLOOD, ED: Fecal Occult Blood: NEGATIVE

## 2024-01-09 LAB — HEMOGLOBIN A1C
Hgb A1c MFr Bld: 6.1 % — ABNORMAL HIGH (ref 4.8–5.6)
Mean Plasma Glucose: 128.37 mg/dL

## 2024-01-09 LAB — PROTIME-INR
INR: 1.5 — ABNORMAL HIGH (ref 0.8–1.2)
Prothrombin Time: 18.6 s — ABNORMAL HIGH (ref 11.4–15.2)

## 2024-01-09 LAB — VITAMIN B12: Vitamin B-12: 508 pg/mL (ref 180–914)

## 2024-01-09 LAB — FOLATE: Folate: >40 ng/mL (ref >5.9)

## 2024-01-09 LAB — FERRITIN: Ferritin: 57 ng/mL (ref 24–336)

## 2024-01-09 MED ORDER — PANTOPRAZOLE SODIUM 40 MG IV SOLR
40.0000 mg | Freq: Once | INTRAVENOUS | Status: AC
  Administered 2024-01-09: 40 mg via INTRAVENOUS
  Filled 2024-01-09: qty 10

## 2024-01-09 MED ORDER — METOPROLOL SUCCINATE ER 50 MG PO TB24
200.0000 mg | ORAL_TABLET | Freq: Every day | ORAL | Status: DC
  Administered 2024-01-10 – 2024-01-18 (×8): 200 mg via ORAL
  Filled 2024-01-09 (×9): qty 4

## 2024-01-09 MED ORDER — INSULIN GLARGINE-YFGN 100 UNIT/ML ~~LOC~~ SOLN
5.0000 [IU] | Freq: Every day | SUBCUTANEOUS | Status: DC
  Administered 2024-01-09 – 2024-01-17 (×9): 5 [IU] via SUBCUTANEOUS
  Filled 2024-01-09 (×10): qty 0.05

## 2024-01-09 MED ORDER — ONDANSETRON HCL 4 MG/2ML IJ SOLN: 4.0000 mg | Freq: Four times a day (QID) | INTRAMUSCULAR | Status: DC | PRN

## 2024-01-09 MED ORDER — ONDANSETRON HCL 4 MG PO TABS: 4.0000 mg | ORAL_TABLET | Freq: Four times a day (QID) | ORAL | Status: DC | PRN

## 2024-01-09 MED ORDER — ACETAMINOPHEN 325 MG PO TABS
650.0000 mg | ORAL_TABLET | Freq: Four times a day (QID) | ORAL | Status: DC | PRN
Start: 2024-01-09 — End: 2024-01-18
  Administered 2024-01-09 – 2024-01-15 (×8): 650 mg via ORAL
  Filled 2024-01-09 (×8): qty 2

## 2024-01-09 MED ORDER — ROSUVASTATIN CALCIUM 20 MG PO TABS
40.0000 mg | ORAL_TABLET | Freq: Every day | ORAL | Status: DC
  Administered 2024-01-09 – 2024-01-18 (×10): 40 mg via ORAL
  Filled 2024-01-09 (×10): qty 2

## 2024-01-09 MED ORDER — EZETIMIBE 10 MG PO TABS
10.0000 mg | ORAL_TABLET | Freq: Every morning | ORAL | Status: DC
  Administered 2024-01-10 – 2024-01-18 (×9): 10 mg via ORAL
  Filled 2024-01-09 (×9): qty 1

## 2024-01-09 MED ORDER — INSULIN ASPART 100 UNIT/ML IJ SOLN
0.0000 [IU] | Freq: Every day | INTRAMUSCULAR | Status: DC
  Administered 2024-01-13 – 2024-01-17 (×4): 2 [IU] via SUBCUTANEOUS

## 2024-01-09 MED ORDER — BUDESON-GLYCOPYRROL-FORMOTEROL 160-9-4.8 MCG/ACT IN AERO
2.0000 | INHALATION_SPRAY | Freq: Two times a day (BID) | RESPIRATORY_TRACT | Status: DC
  Filled 2024-01-09: qty 5.9

## 2024-01-09 MED ORDER — ALLOPURINOL 100 MG PO TABS
100.0000 mg | ORAL_TABLET | Freq: Every day | ORAL | Status: DC
  Administered 2024-01-09 – 2024-01-18 (×10): 100 mg via ORAL
  Filled 2024-01-09 (×10): qty 1

## 2024-01-09 MED ORDER — OMEGA-3-ACID ETHYL ESTERS 1 G PO CAPS
2.0000 g | ORAL_CAPSULE | Freq: Two times a day (BID) | ORAL | Status: DC
  Administered 2024-01-09 – 2024-01-18 (×18): 2 g via ORAL
  Filled 2024-01-09 (×18): qty 2

## 2024-01-09 MED ORDER — MELATONIN 3 MG PO TABS
3.0000 mg | ORAL_TABLET | Freq: Every day | ORAL | Status: DC
  Administered 2024-01-09 – 2024-01-11 (×3): 3 mg via ORAL
  Filled 2024-01-09 (×3): qty 1

## 2024-01-09 MED ORDER — SODIUM CHLORIDE 0.9 % IV SOLN: INTRAVENOUS | Status: AC

## 2024-01-09 MED ORDER — OXYCODONE-ACETAMINOPHEN 5-325 MG PO TABS
1.0000 | ORAL_TABLET | Freq: Four times a day (QID) | ORAL | Status: DC | PRN
  Administered 2024-01-10 (×2): 1 via ORAL
  Filled 2024-01-09 (×3): qty 1

## 2024-01-09 MED ORDER — ACETAMINOPHEN 650 MG RE SUPP: 650.0000 mg | Freq: Four times a day (QID) | RECTAL | Status: DC | PRN

## 2024-01-09 MED ORDER — SODIUM CHLORIDE 0.9 % IV BOLUS
500.0000 mL | Freq: Once | INTRAVENOUS | Status: AC
  Administered 2024-01-09: 500 mL via INTRAVENOUS

## 2024-01-09 MED ORDER — INSULIN ASPART 100 UNIT/ML IJ SOLN
0.0000 [IU] | Freq: Three times a day (TID) | INTRAMUSCULAR | Status: DC
  Administered 2024-01-09 – 2024-01-10 (×4): 1 [IU] via SUBCUTANEOUS
  Administered 2024-01-11 (×2): 2 [IU] via SUBCUTANEOUS
  Administered 2024-01-11: 1 [IU] via SUBCUTANEOUS
  Administered 2024-01-12 – 2024-01-13 (×5): 2 [IU] via SUBCUTANEOUS
  Administered 2024-01-13: 3 [IU] via SUBCUTANEOUS
  Administered 2024-01-14: 2 [IU] via SUBCUTANEOUS
  Administered 2024-01-14 – 2024-01-15 (×3): 3 [IU] via SUBCUTANEOUS
  Administered 2024-01-15 – 2024-01-18 (×9): 2 [IU] via SUBCUTANEOUS

## 2024-01-09 MED ORDER — LORATADINE 10 MG PO TABS
10.0000 mg | ORAL_TABLET | Freq: Every morning | ORAL | Status: DC
  Administered 2024-01-10 – 2024-01-18 (×9): 10 mg via ORAL
  Filled 2024-01-09 (×9): qty 1

## 2024-01-09 MED ORDER — ALBUTEROL SULFATE (2.5 MG/3ML) 0.083% IN NEBU
2.5000 mg | INHALATION_SOLUTION | Freq: Four times a day (QID) | RESPIRATORY_TRACT | Status: DC | PRN
  Administered 2024-01-13 – 2024-01-17 (×3): 2.5 mg via RESPIRATORY_TRACT
  Filled 2024-01-09 (×2): qty 3

## 2024-01-09 NOTE — ED Provider Notes (Signed)
  EMERGENCY DEPARTMENT AT [facility] Provider Note   CSN: [id number] Arrival date & time: 01/09/24  1207     Patient presents with: Tommy Riley Delphina is a 78 y.o. male.  History of COPD, CHF, sleep apnea on CPAP, atrial fibrillation Eliquis .  Presents the ER today for evaluation of fall with left knee pain.  He states he fell last night.  He was trying to get something from the bathroom, was not using his cane or walker because it was a very short distance and he fell down.  Denies tripping, does not, or getting dizzy, did not pass out, did not hit his head.  No chest pain or shortness of breath.  He does note that he takes torsemide  twice a day normally fills up urinal 2 times throughout the night and only had a very small amount of urine last night.  He states he fell about 2 weeks ago and had multiple rib fractures and transverse process fractures of L1 and L2.    Fall       Prior to Admission medications   Medication Sig Start Date End Date Taking? Authorizing Provider  albuterol  (PROVENTIL ) (2.5 MG/3ML) 0.083% nebulizer solution Take 3 mLs (2.5 mg total) by nebulization every 6 (six) hours as needed for wheezing or shortness of breath. 12/31/23  Yes Parrett, Tammy S, NP  albuterol  (VENTOLIN  HFA) 108 (90 Base) MCG/ACT inhaler USE 2 INHALATIONS BY MOUTH EVERY 4 HOURS AS NEEDED FOR SHORTNESS  OF BREATH OR WHEEZE 10/19/23  Yes Cobb, Alyssa L, NP  allopurinol  (ZYLOPRIM ) 100 MG tablet Taking two tablets by mouth in the morning and 1 tablet by mouth in the evening   Yes [provider]  apixaban  (ELIQUIS ) 5 MG TABS tablet TAKE 1 TABLET BY MOUTH TWICE  DAILY 09/29/23  Yes Debera Jayson MATSU, MD  Ascorbic Acid  (VITAMIN C PO) Take 2,000 mg by mouth daily.   Yes [provider]  aspirin  81 MG EC tablet Take 81 mg by mouth in the morning.   Yes [provider]  Cholecalciferol  (VITAMIN D3) 2000 UNITS TABS Take 1 capsule by mouth in the morning  and at bedtime.   Yes [provider]  Continuous Glucose Sensor (DEXCOM G7 SENSOR) MISC See admin instructions. 06/21/23  Yes [provider]  dapagliflozin  propanediol (FARXIGA ) 10 MG TABS tablet Take 1 tablet (10 mg total) by mouth daily. 10/28/23  Yes Debera Jayson MATSU, MD  diphenhydramine -acetaminophen  (TYLENOL  PM) 25-500 MG TABS tablet Take 2 tablets by mouth at bedtime.   Yes [provider]  ezetimibe  (ZETIA ) 10 MG tablet Take 10 mg by mouth in the morning.   Yes [provider]  fluticasone  (FLONASE ) 50 MCG/ACT nasal spray Place 1 spray into both nostrils daily. 01/11/23  Yes Sood, Vineet, MD  Fluticasone -Umeclidin-Vilant (TRELEGY ELLIPTA ) 100-62.5-25 MCG/ACT AEPB Inhale 1 puff into the lungs daily. 08/06/23  Yes Cobb, Comer GAILS, NP  folic acid  (FOLVITE ) 400 MCG tablet Take 400 mcg by mouth every evening.   Yes [provider]  insulin  aspart (NOVOLOG  FLEXPEN) 100 UNIT/ML FlexPen Inject 2-20 Units into the skin See admin instructions. As directed twice daily sliding scale 08/09/23  Yes [provider]  insulin  degludec (TRESIBA) 100 UNIT/ML FlexTouch Pen Inject 50 Units into the skin 2 (two) times daily.   Yes [provider]  loratadine  (CLARITIN ) 10 MG tablet Take 10 mg by mouth in the morning.   Yes [provider]  losartan  (COZAAR ) 50 MG tablet TAKE 1 TABLET BY MOUTH DAILY 07/23/23  Yes Debera Jayson MATSU, MD  melatonin 5 MG TABS Take 5 mg by mouth at bedtime.   Yes [provider]  metoprolol  (TOPROL -XL) 200 MG 24 hr tablet TAKE 1 TABLET BY MOUTH DAILY  WITH OR IMMEDIATELY FOLLOWING A  MEAL 08/02/23  Yes Debera Jayson MATSU, MD  Multiple Vitamins-Minerals (CENTRUM CARDIO) TABS Take 1 tablet by mouth in the morning.   Yes [provider]  omega-3 acid ethyl esters (LOVAZA ) 1 g capsule Take 2 g by mouth 2 (two) times daily.   Yes [provider]  oxyCODONE -acetaminophen  (PERCOCET/ROXICET) 5-325  MG tablet Take 1 every 6 hours for pain that is not relieved by Tylenol . Patient taking differently: Take 1 tablet by mouth every 6 (six) hours as needed for moderate pain (pain score 4-6). Take 1/2 to 1 tablet every 6 hours for pain that is not relieved by Tylenol . 12/28/23  Yes Zammit, Joseph, MD  oxymetazoline  (AFRIN) 0.05 % nasal spray Place 1 spray into both nostrils as needed for congestion.   Yes [provider]  OZEMPIC, 0.25 OR 0.5 MG/DOSE, 2 MG/3ML SOPN Inject 1 mg into the skin once a week. On Fridays 10/16/22  Yes [provider]  potassium chloride  SA (KLOR-CON  M) 20 MEQ tablet TAKE 3 TABLETS BY MOUTH 3 TIMES  DAILY 06/01/23  Yes Debera Jayson MATSU, MD  rosuvastatin  (CRESTOR ) 40 MG tablet TAKE 1 TABLET BY MOUTH DAILY 12/21/23  Yes Debera Jayson MATSU, MD  sertraline  (ZOLOFT ) 100 MG tablet Take 150 mg by mouth in the morning. 08/04/21  Yes [provider]  spironolactone  (ALDACTONE ) 25 MG tablet TAKE 1 TABLET BY MOUTH DAILY 12/21/23  Yes Debera Jayson MATSU, MD  torsemide  (DEMADEX ) 20 MG tablet Take 1 tablet (20 mg total) by mouth as directed. TAKE 4 TABLETS BY MOUTH TWICE  DAILY MAY TAKE AN ADDITIONAL 1  TABLET BY MOUTH DAILY AS NEEDED  FOR 2 TO 3 LBS WEIGHT GAIN IN 1  DAY OR 5 LBS IN 1 WEEK 12/24/23  Yes Debera Jayson MATSU, MD  zinc  sulfate 220 (50 Zn) MG capsule Take 220 mg by mouth every evening.   Yes [provider]  ACCU-CHEK AVIVA PLUS test strip  07/09/14   [provider]  Blood Glucose Monitoring Suppl (ONE TOUCH ULTRA 2) W/DEVICE KIT by Does not apply route.    [provider]  RELION PEN NEEDLE 31G/8MM 31G X 8 MM MISC 3 (three) times daily. 08/31/21   [provider]    Allergies: Codeine    Review of Systems  Updated Vital Signs BP (!) 116/57   Pulse 72   Temp 98 F (36.7 C) (Oral)   Resp 20   Ht 5' 11 (1.803 m)   Wt 125.5 kg   SpO2 100%   BMI 38.59 kg/m   Physical Exam Vitals and nursing note reviewed.   Constitutional:      General: He is not in acute distress.    Appearance: He is well-developed.  HENT:     Head: Normocephalic and atraumatic.     Mouth/Throat:     Mouth: Mucous membranes are moist.  Eyes:     Conjunctiva/sclera: Conjunctivae normal.  Cardiovascular:     Rate and Rhythm: Normal rate and regular rhythm.     Heart sounds: No murmur heard. Pulmonary:     Effort: Pulmonary effort is normal. No respiratory distress.     Breath  sounds: Normal breath sounds.  Abdominal:     General: There is no distension.     Palpations: Abdomen is soft.     Tenderness: There is no abdominal tenderness.  Genitourinary:    Rectum: Normal. Guaiac result negative.     Comments: RN Dawn Chaperoned exam  Musculoskeletal:        General: Swelling present.     Cervical back: Neck supple.     Comments: There is swelling with ecchymosis to left anterior knee and lateral distal thigh.  Compartments of legs are supple.  Distal pulses are intact.  Patient can flex to approximately 80 degrees and can extend knee.  No erythema or warmth.  Skin:    General: Skin is warm and dry.     Capillary Refill: Capillary refill takes less than 2 seconds.     Comments: Phimosis left knee, no erythema or warmth.  Neurological:     General: No focal deficit present.     Mental Status: He is alert and oriented to person, place, and time.  Psychiatric:        Mood and Affect: Mood normal.     (all labs ordered are listed, but only abnormal results are displayed) Labs Reviewed  BASIC METABOLIC PANEL WITH GFR - Abnormal; Notable for the following components:      Result Value   Glucose, Bld 194 (*)    BUN 28 (*)    Creatinine, Ser 1.55 (*)    Calcium  8.5 (*)    GFR, Estimated 46 (*)    All other components within normal limits  CBC WITH DIFFERENTIAL/PLATELET - Abnormal; Notable for the following components:   RBC 2.82 (*)    Hemoglobin 8.2 (*)    HCT 28.3 (*)    MCV 100.4 (*)    MCHC 29.0 (*)     RDW 17.3 (*)    Platelets 138 (*)    All other components within normal limits  COMPREHENSIVE METABOLIC PANEL WITH GFR - Abnormal; Notable for the following components:   Potassium 5.2 (*)    Glucose, Bld 195 (*)    BUN 27 (*)    Creatinine, Ser 1.54 (*)    Calcium  8.6 (*)    Total Protein 5.5 (*)    Albumin 3.1 (*)    AST 65 (*)    ALT 94 (*)    GFR, Estimated 47 (*)    All other components within normal limits  URINALYSIS, ROUTINE W REFLEX MICROSCOPIC - Abnormal; Notable for the following components:   Glucose, UA >=500 (*)    All other components within normal limits  HEPATIC FUNCTION PANEL - Abnormal; Notable for the following components:   Total Protein 5.6 (*)    Albumin 3.1 (*)    AST 67 (*)    ALT 95 (*)    All other components within normal limits  PROTIME-INR - Abnormal; Notable for the following components:   Prothrombin Time 18.6 (*)    INR 1.5 (*)    All other components within normal limits  IRON AND TIBC - Abnormal; Notable for the following components:   Saturation Ratios 15 (*)    All other components within normal limits  RETICULOCYTES - Abnormal; Notable for the following components:   RBC. 2.85 (*)    Immature Retic Fract 32.8 (*)    All other components within normal limits  HEMOGLOBIN A1C - Abnormal; Notable for the following components:   Hgb A1c MFr Bld 6.1 (*)  All other components within normal limits  GLUCOSE, CAPILLARY - Abnormal; Notable for the following components:   Glucose-Capillary 141 (*)    All other components within normal limits  GLUCOSE, CAPILLARY - Abnormal; Notable for the following components:   Glucose-Capillary 170 (*)    All other components within normal limits  VITAMIN B12  FERRITIN  FOLATE  TSH  CBC  BASIC METABOLIC PANEL WITH GFR  POC OCCULT BLOOD, ED    EKG: None  Radiology: DG Knee Complete 4 Views Left Result Date: 01/09/2024 CLINICAL DATA:  Fall EXAM: LEFT KNEE - COMPLETE 4+ VIEW COMPARISON:  None  Available. FINDINGS: No fracture of the proximal tibia or distal femur. Patella is normal. No joint effusion. IMPRESSION: No fracture or dislocation. Electronically Signed   By: Jackquline Boxer M.D.   On: 01/09/2024 12:46     Procedures   Medications Ordered in the ED  insulin  aspart (novoLOG ) injection 0-9 Units (1 Units Subcutaneous Given 01/09/24 1632)  insulin  aspart (novoLOG ) injection 0-5 Units ( Subcutaneous Not Given 01/09/24 2108)  insulin  glargine-yfgn (SEMGLEE ) injection 5 Units (5 Units Subcutaneous Given 01/09/24 2110)  0.9 %  sodium chloride  infusion ( Intravenous Infusion Verify 01/09/24 1734)  acetaminophen  (TYLENOL ) tablet 650 mg (650 mg Oral Given 01/09/24 2027)    Or  acetaminophen  (TYLENOL ) suppository 650 mg ( Rectal See Alternative 01/09/24 2027)  ondansetron  (ZOFRAN ) tablet 4 mg (has no administration in time range)    Or  ondansetron  (ZOFRAN ) injection 4 mg (has no administration in time range)  albuterol  (PROVENTIL ) (2.5 MG/3ML) 0.083% nebulizer solution 2.5 mg (has no administration in time range)  allopurinol  (ZYLOPRIM ) tablet 100 mg (100 mg Oral Given 01/09/24 1632)  ezetimibe  (ZETIA ) tablet 10 mg (has no administration in time range)  budesonide -glycopyrrolate -formoterol  (BREZTRI ) 160-9-4.8 MCG/ACT inhaler 2 puff (2 puffs Inhalation Patient Refused/Not Given 01/09/24 1949)  loratadine  (CLARITIN ) tablet 10 mg (has no administration in time range)  metoprolol  succinate (TOPROL -XL) 24 hr tablet 200 mg (200 mg Oral Patient Refused/Not Given 01/09/24 1633)  omega-3 acid ethyl esters (LOVAZA ) capsule 2 g (2 g Oral Given 01/09/24 2109)  rosuvastatin  (CRESTOR ) tablet 40 mg (40 mg Oral Given 01/09/24 1632)  melatonin tablet 3 mg (has no administration in time range)  sodium chloride  0.9 % bolus 500 mL (500 mLs Intravenous New Bag/Given 01/09/24 1544)  pantoprazole  (PROTONIX ) injection 40 mg (40 mg Intravenous Given 01/09/24 1547)                                    Medical Decision  Making This patient presents to the ED for concern of fall, left knee pain and swelling, this involves an extensive number of treatment options, and is a complaint that carries with it a high risk of complications and morbidity.  The differential diagnosis includes fracture, dislocation, sprain, contusion, hemarthrosis, electrolyte disturbance, anemia, other   Co morbidities that complicate the patient evaluation :   CHF, COPD, sleep apnea, cirrhosis   Additional history obtained:  Additional history obtained from EMR External records from outside source obtained and reviewed including prior notes, labs, echocardiogram   Lab Tests:  I Ordered, and personally interpreted labs.  The pertinent results include: Hemoccult negative, UA shows glucosuria otherwise normal, CMP with mild AKI creatinine 1.54 up from baseline around 1-1.2, AST and ALT slightly elevated but at baseline. CBC noted to have anemia with hemoglobin of 8.2, this is down from  10.512 days ago and his baseline is between 13 and 14 over the past couple of years mild thrombocytopenia baseline   Imaging Studies ordered:  I ordered imaging studies including x-ray left knee which shows no fracture, dislocation or effusion I independently visualized and interpreted imaging within scope of identifying emergent findings  I agree with the radiologist interpretation   Cardiac Monitoring: / EKG:  The patient was maintained on a cardiac monitor.  I personally viewed and interpreted the cardiac monitored which showed an underlying rhythm of: regular rhythm, RBBB   Consultations Obtained:  I requested consultation with the hospitalist Dr. Ricky,  and discussed lab and imaging findings as well as pertinent plan - they recommend: Admission, please place order for renal ultrasound  As a consult with Dr. Shaaron, he requested patient be kept n.p.o. after midnight and his service will see the patient in the morning   Problem List / ED  Course / Critical interventions / Medication management  Fall-patient had a fall, did not trip and fall but did not pass out, no head injury or loss consciousness, having left knee pain and swelling.  He is able to flex extend the knee but range of motion slightly limited due to to pain.  Distal pulses are intact.  He has a large hematoma but x-rays are negative.  No sign of septic arthritis on exam.  Labs ordered due to 2 on precipitated falls in the past 2 weeks.  Noted to be anemic with hemoglobin of 8.2, baseline is around 13, hemoglobin was 10.5 12 days ago.  Patient denies black stools at home, no stool in his rectal vault on exam but small no mucus is guaiac negative.  He has a history of cirrhosis so I will reach out to GI to see if they have any further recommendations but given his AKI, borderline low blood pressure and anemia we will plan on admission for further evaluation.  He is able to empty his bladder but will order a bladder scan to ensure he is not having any level of retention causing his AKI.  Only given a small amount of fluids due to his history of heart failure.  No signs of infection as a cause of his low blood pressure and does not meet sepsis criteria  I have reviewed the patients home medicines and have made adjustments as needed   Social Determinants of Health:  Lives at home with his wife     Amount and/or Complexity of Data Reviewed Labs: ordered. Radiology: ordered.  Risk Prescription drug management. Decision regarding hospitalization.        Final diagnoses:  AKI (acute kidney injury) (HCC)  Anemia, unspecified type    ED Discharge Orders     None          Suellen Sherran DELENA DEVONNA 01/09/24 2203    Suzette Pac, MD 01/12/24 [phone]

## 2024-01-09 NOTE — ED Triage Notes (Addendum)
 Pt fell yesterday afternoon due to weakness in both legs.  Pt with swelling and bruising noted to left knee, blisters noted as well. Denies hitting his head with fall. + blood thinners.

## 2024-01-09 NOTE — Plan of Care (Signed)

## 2024-01-09 NOTE — H&P (Signed)
 History and Physical    Patient: Tommy Riley FMW:[id number] DOB: 05-Jun-1946 DOA: 01/09/2024 DOS: the patient was seen and examined on 01/09/2024 PCP: Marchelle Clem Pitts, MD   Patient coming from: Home  Chief Complaint:  Chief Complaint  Patient presents with   Fall   HPI: LORENCE Riley is a 78 y.o. male with medical history significant of history of atrial flutter/atrial fibrillation on chronic anticoagulation, chronic kidney disease stage II, hypertension, type 2 diabetes mellitus with nephropathy, chronic respiratory failure with hypoxia due to COPD, hyperlipidemia, lymphedema and history of class II obesity; who presented to the hospital after mechanical fall.  Patient reports no chest pain, no shortness of breath, no nausea, no vomiting, no prodrome make symptoms and essentially just tripping causing his fall.  Significant bruise appreciated on his knees; patient expressed no hitting his head.  Workup significant for positive decrease in patient's hemoglobin into the 8.2 range (chronically around 10-13); elevated MCV at 100.4 and comprehensive metabolic panel demonstrating acute kidney injury creatinine of 1.54 and potassium 5.2.  Fecal occult blood test negative and patient reported no dysuria, hematuria, melena or hematemesis.  GI service consulted and TR[facility] contacted to place patient in the hospital for further evaluation and management.  Review of Systems: As mentioned in the history of present illness. All other systems reviewed and are negative. Past Medical History:  Diagnosis Date   Anxiety    Atrial fibrillation and flutter (HCC)    Atrial fibrillation ablation 2011 at [facility]   CAD in native artery    Chronic systolic heart failure (HCC)    CKD (chronic kidney disease), stage III (HCC)    COPD (chronic obstructive pulmonary disease) (HCC)    COVID-22 February 2020   Depression    Essential hypertension    Gout    Hyperlipidemia    Ischemic heart disease    OSA  (obstructive sleep apnea)    Stroke (HCC) 2003   Type 2 diabetes mellitus (HCC)    Past Surgical History:  Procedure Laterality Date   ATRIAL FIBRILLATION ABLATION  2011   [facility]   AV NODE ABLATION N/A 05/07/2021   Procedure: AV NODE ABLATION;  Surgeon: Waddell Danelle ORN, MD;  Location: [facility] INVASIVE CV LAB;  Service: Cardiovascular;  Laterality: N/A;   AV NODE ABLATION N/A 09/01/2021   Procedure: AV NODE ABLATION;  Surgeon: Waddell Danelle ORN, MD;  Location: [facility] INVASIVE CV LAB;  Service: Cardiovascular;  Laterality: N/A;   CATARACT EXTRACTION Bilateral    CORONARY ARTERY BYPASS GRAFT  2001   HERNIA REPAIR Right    ICD IMPLANT N/A 05/07/2021   Procedure: ICD IMPLANT;  Surgeon: Waddell Danelle ORN, MD;  Location: [facility] INVASIVE CV LAB;  Service: Cardiovascular;  Laterality: N/A;   RIGHT/LEFT HEART CATH AND CORONARY/GRAFT ANGIOGRAPHY N/A 05/05/2021   Procedure: RIGHT/LEFT HEART CATH AND CORONARY/GRAFT ANGIOGRAPHY;  Surgeon: Court Dorn PARAS, MD;  Location: [facility] INVASIVE CV LAB;  Service: Cardiovascular;  Laterality: N/A;   TONSILLECTOMY AND ADENOIDECTOMY     Social History:  reports that he has been smoking e-cigarettes and cigarettes. He started smoking about 62 years ago. He has a 125 pack-year smoking history. He has been exposed to tobacco smoke. He has never used smokeless tobacco. He reports current alcohol use. He reports that he does not use drugs.  Allergies  Allergen Reactions   Codeine Anaphylaxis, Shortness Of Breath and Other (See Comments)    Tightness in chest, Chest pain    Family  History  Problem Relation Age of Onset   Raynaud syndrome Mother    Stroke Father    Diabetes Mellitus II Father    Hypertension Father    Hypertension Sister    Diabetes Mellitus II Sister    Stroke Brother    Crohn's disease Brother    Breast cancer Brother    COPD Brother     Prior to Admission medications   Medication Sig Start Date End Date Taking? Authorizing Provider  ACCU-CHEK AVIVA PLUS test  strip  07/09/14   [provider]  albuterol  (PROVENTIL ) (2.5 MG/3ML) 0.083% nebulizer solution Take 3 mLs (2.5 mg total) by nebulization every 6 (six) hours as needed for wheezing or shortness of breath. 12/31/23   Parrett, Tammy S, NP  albuterol  (VENTOLIN  HFA) 108 (90 Base) MCG/ACT inhaler USE 2 INHALATIONS BY MOUTH EVERY 4 HOURS AS NEEDED FOR SHORTNESS  OF BREATH OR WHEEZE 10/19/23   Cobb, Alyssa L, NP  allopurinol  (ZYLOPRIM ) 100 MG tablet Taking two tablets by mouth in the morning and 1 tablet by mouth in the evening    [provider]  apixaban  (ELIQUIS ) 5 MG TABS tablet TAKE 1 TABLET BY MOUTH TWICE  DAILY 09/29/23   Debera Jayson MATSU, MD  Ascorbic Acid  (VITAMIN C PO) Take 2,000 mg by mouth daily.    [provider]  aspirin  81 MG EC tablet Take 81 mg by mouth in the morning.    [provider]  Blood Glucose Monitoring Suppl (ONE TOUCH ULTRA 2) W/DEVICE KIT by Does not apply route.    [provider]  Cholecalciferol  (VITAMIN D3) 2000 UNITS TABS Take 1 capsule by mouth in the morning and at bedtime.    [provider]  Continuous Glucose Sensor (DEXCOM G7 SENSOR) MISC See admin instructions. 06/21/23   [provider]  dapagliflozin  propanediol (FARXIGA ) 10 MG TABS tablet Take 1 tablet (10 mg total) by mouth daily. 10/28/23   Debera Jayson MATSU, MD  diphenhydramine -acetaminophen  (TYLENOL  PM) 25-500 MG TABS tablet Take 2 tablets by mouth at bedtime.    [provider]  ezetimibe  (ZETIA ) 10 MG tablet Take 10 mg by mouth in the morning.    [provider]  fluticasone  (FLONASE ) 50 MCG/ACT nasal spray Place 1 spray into both nostrils daily. 01/11/23   Sood, Vineet, MD  Fluticasone -Umeclidin-Vilant (TRELEGY ELLIPTA ) 100-62.5-25 MCG/ACT AEPB Inhale 1 puff into the lungs daily. 08/06/23   Cobb, Comer GAILS, NP  folic acid  (FOLVITE ) 400 MCG tablet Take 400 mcg by mouth every evening.    [provider]  HUMALOG  KWIKPEN  200 UNIT/ML KwikPen 20 Units as needed. On a sliding scale 09/12/21   [provider]  insulin  aspart (NOVOLOG  FLEXPEN) 100 UNIT/ML FlexPen As directed twice daily 08/09/23   [provider]  insulin  degludec (TRESIBA) 100 UNIT/ML FlexTouch Pen Inject 50 Units into the skin 2 (two) times daily.    [provider]  loratadine  (CLARITIN ) 10 MG tablet Take 10 mg by mouth in the morning.    [provider]  losartan  (COZAAR ) 50 MG tablet TAKE 1 TABLET BY MOUTH DAILY 07/23/23   Debera Jayson MATSU, MD  melatonin 5 MG TABS Take 5 mg by mouth at bedtime.    [provider]  metoprolol  (TOPROL -XL) 200 MG 24 hr tablet TAKE 1 TABLET BY MOUTH DAILY  WITH OR IMMEDIATELY FOLLOWING A  MEAL 08/02/23   Debera Jayson MATSU, MD  Multiple Vitamins-Minerals (CENTRUM CARDIO) TABS Take 1 tablet by  mouth in the morning.    [provider]  omega-3 acid ethyl esters (LOVAZA ) 1 g capsule Take 2 g by mouth 2 (two) times daily.    [provider]  oxyCODONE -acetaminophen  (PERCOCET/ROXICET) 5-325 MG tablet Take 1 every 6 hours for pain that is not relieved by Tylenol . 12/28/23   Suzette Pac, MD  oxymetazoline  (AFRIN) 0.05 % nasal spray Place 1 spray into both nostrils as needed for congestion.    [provider]  OZEMPIC, 0.25 OR 0.5 MG/DOSE, 2 MG/3ML SOPN Inject 0.5 Units into the skin once a week. On Fridays 10/16/22   [provider]  potassium chloride  SA (KLOR-CON  M) 20 MEQ tablet TAKE 3 TABLETS BY MOUTH 3 TIMES  DAILY 06/01/23   Debera Jayson MATSU, MD  RELION PEN NEEDLE 31G/8MM 31G X 8 MM MISC 3 (three) times daily. 08/31/21   [provider]  rosuvastatin  (CRESTOR ) 40 MG tablet TAKE 1 TABLET BY MOUTH DAILY 12/21/23   Debera Jayson MATSU, MD  sertraline  (ZOLOFT ) 100 MG tablet Take 150 mg by mouth in the morning. 08/04/21   [provider]  spironolactone  (ALDACTONE ) 25 MG tablet TAKE 1 TABLET BY MOUTH DAILY 12/21/23   Debera Jayson MATSU, MD  torsemide  (DEMADEX ) 20 MG tablet Take 1 tablet (20 mg total) by mouth as directed. TAKE 4 TABLETS BY MOUTH TWICE  DAILY MAY TAKE AN ADDITIONAL 1  TABLET BY MOUTH DAILY AS NEEDED  FOR 2 TO 3 LBS WEIGHT GAIN IN 1  DAY OR 5 LBS IN 1 WEEK 12/24/23   Debera Jayson MATSU, MD  zinc  sulfate 220 (50 Zn) MG capsule Take 220 mg by mouth every evening.    [provider]    Physical Exam: Vitals:   01/09/24 1216 01/09/24 1230 01/09/24 1245 01/09/24 1330  BP:  100/62 (!) 95/56 (!) 102/54  Pulse:  70 70 70  Resp:      Temp:      SpO2:  100% 100% 100%  Weight: 116.6 kg     Height: 5' 11 (1.803 m)      General exam: Alert, awake, following commands appropriately; in no acute distress. Respiratory system: Good saturation on chronic supplementation; no using accessory muscles. Cardiovascular system:RRR.  No rubs, no gallops, no JVD. Gastrointestinal system: Abdomen is nondistended, soft and without guarding.  Positive bowel sounds appreciated on exam Central nervous system: No focal neurological deficits. Extremities: No cyanosis or clubbing; bruises appreciated on his knee.  2+ edema bilaterally with compression stockings in place. Skin: No petechiae. Psychiatry: Mood & affect appropriate.   Data Reviewed: As mentioned above in HPI section.  Assessment and Plan: 1-mechanical fall - Will provide as needed analgesics and have PT evaluation for safety and to determine most appropriate discharge pathway.  2-anemia in a patient on chronic anticoagulation - Holding blood thinners - Follow hemoglobin trend - Anemia panel requested - GI service consulted and will follow recommendations; patient might benefit of endoscopic evaluation. - Transfuse for hemoglobin less than 7.5  3-history of paroxysmal atrial fibrillation - Continue treatment with metoprolol  for rate control - Holding Eliquis  in the setting of acute GI bleed concern - Follow telemetry monitoring.  4-type 2 diabetes  mellitus with nephropathy - Chronic kidney disease stage II at baseline - Holding oral hypoglycemic agents and the use of Ozempic - Check A1c and follow CBG fluctuation - Sliding scale insulin  and Semglee  has been ordered  5-hypertension - Continue metoprolol  - Follow vital signs.  6-history of  chronic diastolic heart failure - Follow daily weights and strict I's and O's - In the setting of acute kidney injury gentle fluid resuscitation will be provided and will hold spironolactone , Demadex  and losartan . -Low-sodium diet discussed with patient.  7-acute kidney injury on chronic kidney disease - Stage II at baseline - Minimize nephrotoxic agents - Follow renal function trend - Gentle fluid resuscitation will be provided.  8-chronic respiratory failure/COPD - No acute exacerbation appreciated - Continue bronchodilator management and oxygen supplementation  9-class II obesity -Body mass index is 38.59 kg/m.  - Low-calorie diet and portion control discussed with patient  10-history of gout - No acute attack/exacerbation - Continue treatment with allopurinol     Advance Care Planning:   Code Status: Full Code   Consults: GI service  Family Communication: No family at bedside.  Severity of Illness: The appropriate patient status for this patient is INPATIENT. Inpatient status is judged to be reasonable and necessary in order to provide the required intensity of service to ensure the patient's safety. The patient's presenting symptoms, physical exam findings, and initial radiographic and laboratory data in the context of their chronic comorbidities is felt to place them at high risk for further clinical deterioration. Furthermore, it is not anticipated that the patient will be medically stable for discharge from the hospital within 2 midnights of admission.   * I certify that at the point of admission it is my clinical judgment that the patient will require inpatient hospital care  spanning beyond 2 midnights from the point of admission due to high intensity of service, high risk for further deterioration and high frequency of surveillance required.*  Author: Eric Nunnery, MD 01/09/2024 2:44 PM  For on call review [email or web address].

## 2024-01-09 NOTE — ED Notes (Signed)
 See triage notes. Pt has chronic edema noted to ble with compression hose on at this time. Both knees with swelling, left with bruising, swelling and blisters that are weeping. Pedal pulse faint but present with hx of PVD. A/o. Able to bend and move leg/knee but ROM limited. Large area of bruising and knot noted to lateral knee and distal upper leg noted.

## 2024-01-09 NOTE — ED Notes (Signed)
 Pt difficult stick, still attempting. Pt a/o. Nad. No changes.

## 2024-01-09 NOTE — ED Notes (Signed)
 Wife states pt has decrease urine output over the night.

## 2024-01-10 ENCOUNTER — Inpatient Hospital Stay (HOSPITAL_COMMUNITY)

## 2024-01-10 ENCOUNTER — Telehealth: Payer: Self-pay | Admitting: Gastroenterology

## 2024-01-10 DIAGNOSIS — D62 Acute posthemorrhagic anemia: Secondary | ICD-10-CM | POA: Diagnosis not present

## 2024-01-10 DIAGNOSIS — N179 Acute kidney failure, unspecified: Secondary | ICD-10-CM | POA: Diagnosis not present

## 2024-01-10 DIAGNOSIS — D649 Anemia, unspecified: Secondary | ICD-10-CM | POA: Diagnosis not present

## 2024-01-10 DIAGNOSIS — S8002XA Contusion of left knee, initial encounter: Secondary | ICD-10-CM

## 2024-01-10 DIAGNOSIS — K7460 Unspecified cirrhosis of liver: Secondary | ICD-10-CM

## 2024-01-10 LAB — CBC
HCT: 24.3 % — ABNORMAL LOW (ref 39.0–52.0)
Hemoglobin: 7.1 g/dL — ABNORMAL LOW (ref 13.0–17.0)
MCH: 28.7 pg (ref 26.0–34.0)
MCHC: 29.2 g/dL — ABNORMAL LOW (ref 30.0–36.0)
MCV: 98.4 fL (ref 80.0–100.0)
Platelets: 136 K/uL — ABNORMAL LOW (ref 150–400)
RBC: 2.47 MIL/uL — ABNORMAL LOW (ref 4.22–5.81)
RDW: 17.4 % — ABNORMAL HIGH (ref 11.5–15.5)
WBC: 7 K/uL (ref 4.0–10.5)
nRBC: 0 % (ref 0.0–0.2)

## 2024-01-10 LAB — BASIC METABOLIC PANEL WITH GFR
Anion gap: 5 (ref 5–15)
BUN: 27 mg/dL — ABNORMAL HIGH (ref 8–23)
CO2: 34 mmol/L — ABNORMAL HIGH (ref 22–32)
Calcium: 8.4 mg/dL — ABNORMAL LOW (ref 8.9–10.3)
Chloride: 103 mmol/L (ref 98–111)
Creatinine, Ser: 1.55 mg/dL — ABNORMAL HIGH (ref 0.61–1.24)
GFR, Estimated: 46 mL/min — ABNORMAL LOW (ref >60)
Glucose, Bld: 137 mg/dL — ABNORMAL HIGH (ref 70–99)
Potassium: 5.3 mmol/L — ABNORMAL HIGH (ref 3.5–5.1)
Sodium: 142 mmol/L (ref 135–145)

## 2024-01-10 LAB — PREPARE RBC (CROSSMATCH)

## 2024-01-10 LAB — GLUCOSE, CAPILLARY
Glucose-Capillary: 135 mg/dL — ABNORMAL HIGH (ref 70–99)
Glucose-Capillary: 137 mg/dL — ABNORMAL HIGH (ref 70–99)
Glucose-Capillary: 144 mg/dL — ABNORMAL HIGH (ref 70–99)
Glucose-Capillary: 174 mg/dL — ABNORMAL HIGH (ref 70–99)

## 2024-01-10 LAB — ABO/RH: ABO/RH(D): O POS

## 2024-01-10 MED ORDER — SODIUM CHLORIDE 0.9% IV SOLUTION: Freq: Once | INTRAVENOUS | Status: AC

## 2024-01-10 MED ORDER — FUROSEMIDE 10 MG/ML IJ SOLN
20.0000 mg | Freq: Once | INTRAMUSCULAR | Status: DC
  Filled 2024-01-10: qty 2

## 2024-01-10 MED ORDER — OXYCODONE HCL 5 MG PO TABS
10.0000 mg | ORAL_TABLET | Freq: Four times a day (QID) | ORAL | Status: DC | PRN
  Administered 2024-01-10 – 2024-01-11 (×2): 10 mg via ORAL
  Filled 2024-01-10 (×5): qty 2

## 2024-01-10 NOTE — Plan of Care (Signed)
  Problem: Health Behavior/Discharge Planning: Goal: Ability to manage health-related needs will improve Outcome: Progressing   Problem: Clinical Measurements: Goal: Diagnostic test results will improve Outcome: Progressing   Problem: Activity: Goal: Risk for activity intolerance will decrease Outcome: Progressing   Problem: Coping: Goal: Level of anxiety will decrease Outcome: Progressing   Problem: Elimination: Goal: Will not experience complications related to urinary retention Outcome: Progressing

## 2024-01-10 NOTE — Plan of Care (Signed)
  Problem: Acute Rehab PT Goals(only PT should resolve) Goal: Pt Will Go Supine/Side To Sit Outcome: Progressing Flowsheets (Taken 01/10/2024 1511) Pt will go Supine/Side to Sit: Independently Goal: Patient Will Transfer Sit To/From Stand Outcome: Progressing Flowsheets (Taken 01/10/2024 1511) Patient will transfer sit to/from stand:  with supervision  with modified independence Goal: Pt Will Transfer Bed To Chair/Chair To Bed Outcome: Progressing Flowsheets (Taken 01/10/2024 1511) Pt will Transfer Bed to Chair/Chair to Bed:  with supervision  with modified independence Goal: Pt Will Ambulate Outcome: Progressing Flowsheets (Taken 01/10/2024 1511) Pt will Ambulate:  75 feet  with supervision  with rolling walker   3:12 PM, 01/10/24 Lynwood Music, MPT Physical Therapist with [facility] [phone] office [phone] mobile phone

## 2024-01-10 NOTE — Plan of Care (Signed)
  Problem: Health Behavior/Discharge Planning: Goal: Ability to manage health-related needs will improve Outcome: Progressing   Problem: Clinical Measurements: Goal: Ability to maintain clinical measurements within normal limits will improve Outcome: Progressing Goal: Will remain free from infection Outcome: Progressing Goal: Diagnostic test results will improve Outcome: Progressing Goal: Respiratory complications will improve Outcome: Progressing   Problem: Activity: Goal: Risk for activity intolerance will decrease 01/10/2024 0708 by German Hollering, LPN Outcome: Progressing 01/10/2024 0706 by German Hollering, LPN Outcome: Progressing   Problem: Coping: Goal: Level of anxiety will decrease 01/10/2024 0708 by German Hollering, LPN Outcome: Progressing 01/10/2024 0706 by German Hollering, LPN Outcome: Progressing   Problem: Elimination: Goal: Will not experience complications related to urinary retention 01/10/2024 0708 by German Hollering, LPN Outcome: Progressing 01/10/2024 0706 by German Hollering, LPN Outcome: Progressing   Problem: Safety: Goal: Ability to remain free from injury will improve Outcome: Progressing

## 2024-01-10 NOTE — Evaluation (Signed)
 Physical Therapy Evaluation Patient Details Name: Tommy Riley MRN: [id number] DOB: 1946-02-23 Today's Date: 01/10/2024  History of Present Illness  Tommy Riley is a 78 y.o. male with medical history significant of history of atrial flutter/atrial fibrillation on chronic anticoagulation, chronic kidney disease stage II, hypertension, type 2 diabetes mellitus with nephropathy, chronic respiratory failure with hypoxia due to COPD, hyperlipidemia, lymphedema and history of class II obesity; who presented to the hospital after mechanical fall.  Patient reports no chest pain, no shortness of breath, no nausea, no vomiting, no prodrome make symptoms and essentially just tripping causing his fall.  Significant bruise appreciated on his knees; patient expressed no hitting his head.     Workup significant for positive decrease in patient's hemoglobin into the 8.2 range (chronically around 10-13); elevated MCV at 100.4 and comprehensive metabolic panel demonstrating acute kidney injury creatinine of 1.54 and potassium 5.2.  Fecal occult blood test negative and patient reported no dysuria, hematuria, melena or hematemesis.     GI service consulted and TR[facility] contacted to place patient in the hospital for further evaluation and management.   Clinical Impression  Patient demonstrates good return for bed mobility, slow labored movement for completing sit to stands having to use RW mostly due to left knee pain and able to ambulate in room, hallway without loss of balance, but limited mostly due to fatigue and mild SOB with SpO2 dropping from 100% to 85% with exertion while on 3 LPM. Patient's SpO2 quickly recovered to 100% after sitting at bedside. Patient tolerated sitting up at bedside to eat lunch after therapy with his family members present. Patient will benefit from continued skilled physical therapy in hospital and recommended venue below to increase strength, balance, endurance for safe ADLs and gait.           If plan is discharge home, recommend the following: A little help with walking and/or transfers;A little help with bathing/dressing/bathroom;Help with stairs or ramp for entrance;Assistance with cooking/housework   Can travel by private vehicle        Equipment Recommendations None recommended by PT  Recommendations for Other Services       Functional Status Assessment Patient has had a recent decline in their functional status and demonstrates the ability to make significant improvements in function in a reasonable and predictable amount of time.     Precautions / Restrictions Precautions Precautions: Fall Recall of Precautions/Restrictions: Intact Restrictions Weight Bearing Restrictions Per Provider Order: No      Mobility  Bed Mobility Overal bed mobility: Modified Independent                  Transfers Overall transfer level: Needs assistance Equipment used: Rolling walker (2 wheels) Transfers: Sit to/from Stand, Bed to chair/wheelchair/BSC Sit to Stand: Contact guard assist   Step pivot transfers: Contact guard assist       General transfer comment: increased time, labored movement    Ambulation/Gait Ambulation/Gait assistance: Contact guard assist, Min assist Gait Distance (Feet): 35 Feet Assistive device: Rolling walker (2 wheels) Gait Pattern/deviations: Decreased step length - right, Decreased step length - left, Decreased stride length, Decreased stance time - left, Trunk flexed Gait velocity: decreased     General Gait Details: slow labored movment without loss of balance, limited mostly due to c/o fatigue and left knee pain  Stairs            Wheelchair Mobility     Tilt Bed    Modified Rankin (Stroke Patients  Only)       Balance Overall balance assessment: Needs assistance Sitting-balance support: Feet supported, No upper extremity supported Sitting balance-Leahy Scale: Good Sitting balance - Comments: seated at EOB    Standing balance support: Reliant on assistive device for balance, During functional activity, Bilateral upper extremity supported Standing balance-Leahy Scale: Fair Standing balance comment: fair/good using RW                             Pertinent Vitals/Pain Pain Assessment Pain Assessment: 0-10 Pain Score: 9  Pain Location: left knee at sight of wound due to fall at home Pain Descriptors / Indicators: Discomfort, Sore Pain Intervention(s): Limited activity within patient's tolerance, Monitored during session, Repositioned    Home Living Family/patient expects to be discharged to:: Private residence Living Arrangements: Spouse/significant other Available Help at Discharge: Family;Available 24 hours/day Type of Home: House Home Access: Stairs to enter Entrance Stairs-Rails: Doctor, general practice of Steps: 2   Home Layout: One level Home Equipment: Tub bench;Grab bars - tub/shower;Rolling Walker (2 wheels);Cane - single point;Rollator (4 wheels)      Prior Function Prior Level of Function : Independent/Modified Independent;Driving             Mobility Comments: household and short distanced community ambulation using RW PRN, occasional driving ADLs Comments: Independent for most household ADLs, assisted by family     Extremity/Trunk Assessment   Upper Extremity Assessment Upper Extremity Assessment: Overall WFL for tasks assessed    Lower Extremity Assessment Lower Extremity Assessment: Generalized weakness;LLE deficits/detail LLE Deficits / Details: grossly -4/5 LLE: Unable to fully assess due to pain LLE Sensation: WNL LLE Coordination: WNL    Cervical / Trunk Assessment Cervical / Trunk Assessment: Kyphotic  Communication   Communication Communication: No apparent difficulties    Cognition Arousal: Alert Behavior During Therapy: WFL for tasks assessed/performed   PT - Cognitive impairments: No apparent impairments                          Following commands: Intact       Cueing Cueing Techniques: Verbal cues     General Comments      Exercises     Assessment/Plan    PT Assessment Patient needs continued PT services  PT Problem List Decreased strength;Decreased activity tolerance;Decreased balance;Decreased mobility;Pain       PT Treatment Interventions DME instruction;Gait training;Stair training;Functional mobility training;Therapeutic activities;Therapeutic exercise;Balance training;Patient/family education    PT Goals (Current goals can be found in the Care Plan section)  Acute Rehab PT Goals Patient Stated Goal: return home with family to assist PT Goal Formulation: With patient Time For Goal Achievement: 01/14/24 Potential to Achieve Goals: Good    Frequency Min 3X/week     Co-evaluation               AM-PAC PT 6 Clicks Mobility  Outcome Measure Help needed turning from your back to your side while in a flat bed without using bedrails?: None Help needed moving from lying on your back to sitting on the side of a flat bed without using bedrails?: None Help needed moving to and from a bed to a chair (including a wheelchair)?: A Little Help needed standing up from a chair using your arms (e.g., wheelchair or bedside chair)?: A Little Help needed to walk in hospital room?: A Little Help needed climbing 3-5 steps with a railing? : A Lot 6  Click Score: 19    End of Session Equipment Utilized During Treatment: Oxygen Activity Tolerance: Patient tolerated treatment well;Patient limited by fatigue Patient left: in bed;with call bell/phone within reach;with family/visitor present Nurse Communication: Mobility status PT Visit Diagnosis: Unsteadiness on feet (R26.81);Other abnormalities of gait and mobility (R26.89);Muscle weakness (generalized) (M62.81)    Time: 8864-8842 PT Time Calculation (min) (ACUTE ONLY): 22 min   Charges:   PT Evaluation $PT Eval Moderate  Complexity: 1 Mod PT Treatments $Therapeutic Activity: 8-22 mins PT General Charges $$ ACUTE PT VISIT: 1 Visit         3:10 PM, 01/10/24 Lynwood Music, MPT Physical Therapist with [facility] [phone] office [phone] mobile phone

## 2024-01-10 NOTE — Progress Notes (Signed)
 Progress Note   Patient: Tommy Riley FMW:[id number] DOB: [birth year] DOA: 01/09/2024     1 DOS: the patient was seen and examined on 01/10/2024   Brief hospital admission narrative: Tommy Riley is a 78 y.o. male with medical history significant of history of atrial flutter/atrial fibrillation on chronic anticoagulation, chronic kidney disease stage II, hypertension, type 2 diabetes mellitus with nephropathy, chronic respiratory failure with hypoxia due to COPD, hyperlipidemia, lymphedema and history of class II obesity; who presented to the hospital after mechanical fall.  Patient reports no chest pain, no shortness of breath, no nausea, no vomiting, no prodrome make symptoms and essentially just tripping causing his fall.  Significant bruise appreciated on his knees; patient expressed no hitting his head.   Workup significant for positive decrease in patient's hemoglobin into the 8.2 range (chronically around 10-13); elevated MCV at 100.4 and comprehensive metabolic panel demonstrating acute kidney injury creatinine of 1.54 and potassium 5.2.  Fecal occult blood test negative and patient reported no dysuria, hematuria, melena or hematemesis.   GI service consulted and [facility] contacted to place patient in the hospital for further evaluation and management.  Assessment and plan 1-mechanical fall - Continue as needed analgesics and follow-up physical therapy evaluation and recommendation  2-anemia in a patient on chronic anticoagulation - No overt bleeding appreciated - Hemoglobin down to 7.1 on today's exam - Will provide 1 unit of blood transfusion and continue to follow hemoglobin trend - Patient high risk for endoscopic evaluation and in the absence of overt bleeding and negative fecal occult blood test we will continue to follow trend - Continue holding anticoagulations - High concerns for significant left leg hematoma after mechanical fall; will check CT scan and discussed with  orthopedic surgery in case he will need any evacuation.  3-history of paroxysmal atrial fibrillation - Continue metoprolol  for rate control - Continue holding Eliquis  - Continue telemetry monitoring and follow electrolytes trend.  4-type 2 diabetes mellitus with nephropathy - Chronic kidney disease stage II at baseline - Continue to maintain adequate hydration, follow renal function trend and continue sliding scale insulin  and Semglee   5-hypertension - Continue metoprolol  - Holding other antihypertensive agents and diuretics in the setting of acute kidney injury  6-chronic diastolic heart failure - Continue daily weights, strict I's and O's and low-sodium diet - Holding Demadex , losartan  and spironolactone  in the setting of acute kidney injury.  7-acute kidney injury on chronic kidney disease - Stage II at baseline - Continue to minimize nephrotoxic agent - Follow renal function trend.  8-class II obesity - Follow low-calorie diet and portion control -Body mass index is 38.59 kg/m.   9-history of gout - Acute exacerbation: Appreciate - Continue treatment with allopurinol .  10-chronic respiratory failure with hypoxia/COPD/OSA - Continue CPAP nightly - Continue chronic oxygen supplementation - No acute exacerbation appreciated and will continue home bronchodilator management..   Subjective: Complaining of significant pain in his left knee//leg; no chest pain, no nausea, no vomiting and no overt bleeding.   Physical Exam: Vitals:   01/10/24 1417 01/10/24 1442 01/10/24 1503 01/10/24 1715  BP: (!) 102/49 (!) 109/47 (!) 108/44 (!) 119/45  Pulse: 69 70 72 71  Resp: 20  18   Temp: (!) 97.5 F (36.4 C) 97.9 F (36.6 C) 98 F (36.7 C) 98 F (36.7 C)  TempSrc: Oral Oral  Oral  SpO2: 90% 100%  100%  Weight:      Height:       General exam: Alert,  awake, oriented x 3; complaining of left leg pain. Respiratory system: Good saturation on chronic  supplementation. Cardiovascular system:RRR. No rubs or gallops; unable to properly assess JVD with body habitus. Gastrointestinal system: Abdomen is obese, nondistended, soft and nontender. No organomegaly or masses felt. Normal bowel sounds heard. Central nervous system:  No focal neurological deficits. Extremities: No cyanosis or clubbing; significant bruises of his left upper extremity and also bruise on his left knee and tense sensation on his thigh most likely consistent with hematoma. Skin: No petechiae. Psychiatry: Judgement and insight appear normal. Mood & affect appropriate.    Data Reviewed: CBC: WBCs 7.0, hemoglobin 7.1 and platelet count 136K Basic metabolic panel: Sodium 142, potassium 5.3, chloride 103, bicarb 34, BUN 27, creatinine 1.5 and GFR 46  Family Communication: No family at bedside.  Disposition: Status is: Inpatient Remains inpatient appropriate because: Continue IV therapy.  Will follow physical therapy recommendations for discharge safest pathway once medically stable.  Time spent: 50 minutes  Author: Eric Nunnery, MD 01/10/2024 6:44 PM  For on call review [email or web address].

## 2024-01-10 NOTE — Consult Note (Signed)
 Gastroenterology Consult   Referring Provider: Dr. Ricky  Primary Care Physician:  Marchelle Clem Pitts, MD Primary Gastroenterologist:  Dr. Cinderella  Patient ID: Darold LITTIE Furnish; [id number]; Dec 14, 1945   Admit date: 01/09/2024  LOS: 1 day   Date of Consultation: 01/10/2024  Reason for Consultation:  Anemia   History of Present Illness   Tommy Riley is a 78 y.o. year old male with multi-morbidities including CKD, COPD  CHF, diabetes, afib on Eliquis , hypertension, cirrhosis with etiology suspected due to MASLD, positive AMA with liver biopsy March 2025 not suggestive of PBC, autoimmune, and biopsy did not show cirrhosis but imaging and laboratory indices consistent with this, frequent falls with most recent fall on Saturday evening but no LOC or head injury.  GI now consulted due to Hgb 8.2, down almost 4 grams from baseline.   In the ED: Hgb 8.2, previously 13 baseline historically,  and noted to be 10.5 a few weeks ago. Down to 7.1 this morning. HEME negative. 1 unit PRBCs has been ordered but not started yet.   Imaging in the ED: left knee xray without fracture or dislocation.   Today: he states he has had no overt GI bleeding. Last dose of Eliquis  the morning of 7.6. He has had frequent falls, and notes falling in June as well. Presented to the ED at that time with Hgb 10.5. He notes he had right flank bruising then and is improving. CT chest/abd/pelvis during ED visit in June with multiple right posterior rib fractures, fractures of the right L1-L2 transverse processes, no pneumothorax. He states sometimes he blacks out and then falls. No abdominal pain, no GERD, no constipation or diarrhea, no mental status changes or jaundice. Denies other NSAIDs. Wife and son at bedside.   Main complaint today is of persistent swelling of left knee, hematoma, and left thigh bruising and pain s/p fall.      Last ZHI:Wnwz Last Colonoscopy:many years ago complicated with hypoxia  He was felt  high risk for colonoscopy/EGD when last seen in outpatient GI clinic on 10/25/23. He reports colonoscopy in past complicated by hypoxia.   Past Medical History:  Diagnosis Date   Anxiety    Atrial fibrillation and flutter (HCC)    Atrial fibrillation ablation 2011 at [facility]   CAD in native artery    Chronic systolic heart failure (HCC)    CKD (chronic kidney disease), stage III (HCC)    COPD (chronic obstructive pulmonary disease) (HCC)    COVID-22 February 2020   Depression    Essential hypertension    Gout    Hyperlipidemia    Ischemic heart disease    OSA (obstructive sleep apnea)    Stroke (HCC) 2003   Type 2 diabetes mellitus (HCC)     Past Surgical History:  Procedure Laterality Date   ATRIAL FIBRILLATION ABLATION  2011   [facility]   AV NODE ABLATION N/A 05/07/2021   Procedure: AV NODE ABLATION;  Surgeon: Waddell Danelle ORN, MD;  Location: [facility] INVASIVE CV LAB;  Service: Cardiovascular;  Laterality: N/A;   AV NODE ABLATION N/A 09/01/2021   Procedure: AV NODE ABLATION;  Surgeon: Waddell Danelle ORN, MD;  Location: [facility] INVASIVE CV LAB;  Service: Cardiovascular;  Laterality: N/A;   CATARACT EXTRACTION Bilateral    CORONARY ARTERY BYPASS GRAFT  2001   HERNIA REPAIR Right    ICD IMPLANT N/A 05/07/2021   Procedure: ICD IMPLANT;  Surgeon: Waddell Danelle ORN, MD;  Location: [facility] INVASIVE CV  LAB;  Service: Cardiovascular;  Laterality: N/A;   RIGHT/LEFT HEART CATH AND CORONARY/GRAFT ANGIOGRAPHY N/A 05/05/2021   Procedure: RIGHT/LEFT HEART CATH AND CORONARY/GRAFT ANGIOGRAPHY;  Surgeon: Court Dorn PARAS, MD;  Location: [facility] INVASIVE CV LAB;  Service: Cardiovascular;  Laterality: N/A;   TONSILLECTOMY AND ADENOIDECTOMY      Prior to Admission medications   Medication Sig Start Date End Date Taking? Authorizing Provider  albuterol  (PROVENTIL ) (2.5 MG/3ML) 0.083% nebulizer solution Take 3 mLs (2.5 mg total) by nebulization every 6 (six) hours as needed for wheezing or shortness of breath. 12/31/23  Yes  Parrett, Tammy S, NP  albuterol  (VENTOLIN  HFA) 108 (90 Base) MCG/ACT inhaler USE 2 INHALATIONS BY MOUTH EVERY 4 HOURS AS NEEDED FOR SHORTNESS  OF BREATH OR WHEEZE 10/19/23  Yes Cobb, Alyssa L, NP  allopurinol  (ZYLOPRIM ) 100 MG tablet Taking two tablets by mouth in the morning and 1 tablet by mouth in the evening   Yes [provider]  apixaban  (ELIQUIS ) 5 MG TABS tablet TAKE 1 TABLET BY MOUTH TWICE  DAILY 09/29/23  Yes Debera Jayson MATSU, MD  Ascorbic Acid  (VITAMIN C PO) Take 2,000 mg by mouth daily.   Yes [provider]  aspirin  81 MG EC tablet Take 81 mg by mouth in the morning.   Yes [provider]  Cholecalciferol  (VITAMIN D3) 2000 UNITS TABS Take 1 capsule by mouth in the morning and at bedtime.   Yes [provider]  Continuous Glucose Sensor (DEXCOM G7 SENSOR) MISC See admin instructions. 06/21/23  Yes [provider]  dapagliflozin  propanediol (FARXIGA ) 10 MG TABS tablet Take 1 tablet (10 mg total) by mouth daily. 10/28/23  Yes Debera Jayson MATSU, MD  diphenhydramine -acetaminophen  (TYLENOL  PM) 25-500 MG TABS tablet Take 2 tablets by mouth at bedtime.   Yes [provider]  ezetimibe  (ZETIA ) 10 MG tablet Take 10 mg by mouth in the morning.   Yes [provider]  fluticasone  (FLONASE ) 50 MCG/ACT nasal spray Place 1 spray into both nostrils daily. 01/11/23  Yes Sood, Vineet, MD  Fluticasone -Umeclidin-Vilant (TRELEGY ELLIPTA ) 100-62.5-25 MCG/ACT AEPB Inhale 1 puff into the lungs daily. 08/06/23  Yes Cobb, Comer GAILS, NP  folic acid  (FOLVITE ) 400 MCG tablet Take 400 mcg by mouth every evening.   Yes [provider]  insulin  aspart (NOVOLOG  FLEXPEN) 100 UNIT/ML FlexPen Inject 2-20 Units into the skin See admin instructions. As directed twice daily sliding scale 08/09/23  Yes [provider]  insulin  degludec (TRESIBA) 100 UNIT/ML FlexTouch Pen Inject 50 Units into the skin 2 (two) times daily.   Yes [provider]   loratadine  (CLARITIN ) 10 MG tablet Take 10 mg by mouth in the morning.   Yes [provider]  losartan  (COZAAR ) 50 MG tablet TAKE 1 TABLET BY MOUTH DAILY 07/23/23  Yes Debera Jayson MATSU, MD  melatonin 5 MG TABS Take 5 mg by mouth at bedtime.   Yes [provider]  metoprolol  (TOPROL -XL) 200 MG 24 hr tablet TAKE 1 TABLET BY MOUTH DAILY  WITH OR IMMEDIATELY FOLLOWING A  MEAL 08/02/23  Yes Debera Jayson MATSU, MD  Multiple Vitamins-Minerals (CENTRUM CARDIO) TABS Take 1 tablet by mouth in the morning.   Yes [provider]  omega-3 acid ethyl esters (LOVAZA ) 1 g capsule Take 2 g by mouth 2 (two) times daily.   Yes [provider]  oxyCODONE -acetaminophen  (PERCOCET/ROXICET) 5-325 MG tablet Take 1 every 6 hours for pain that is not relieved by Tylenol . Patient taking differently: Take  1 tablet by mouth every 6 (six) hours as needed for moderate pain (pain score 4-6). Take 1/2 to 1 tablet every 6 hours for pain that is not relieved by Tylenol . 12/28/23  Yes Zammit, Joseph, MD  oxymetazoline  (AFRIN) 0.05 % nasal spray Place 1 spray into both nostrils as needed for congestion.   Yes [provider]  OZEMPIC, 0.25 OR 0.5 MG/DOSE, 2 MG/3ML SOPN Inject 1 mg into the skin once a week. On Fridays 10/16/22  Yes [provider]  potassium chloride  SA (KLOR-CON  M) 20 MEQ tablet TAKE 3 TABLETS BY MOUTH 3 TIMES  DAILY 06/01/23  Yes Debera Jayson MATSU, MD  rosuvastatin  (CRESTOR ) 40 MG tablet TAKE 1 TABLET BY MOUTH DAILY 12/21/23  Yes Debera Jayson MATSU, MD  sertraline  (ZOLOFT ) 100 MG tablet Take 150 mg by mouth in the morning. 08/04/21  Yes [provider]  spironolactone  (ALDACTONE ) 25 MG tablet TAKE 1 TABLET BY MOUTH DAILY 12/21/23  Yes Debera Jayson MATSU, MD  torsemide  (DEMADEX ) 20 MG tablet Take 1 tablet (20 mg total) by mouth as directed. TAKE 4 TABLETS BY MOUTH TWICE  DAILY MAY TAKE AN ADDITIONAL 1  TABLET BY MOUTH DAILY AS NEEDED  FOR 2 TO 3 LBS WEIGHT GAIN  IN 1  DAY OR 5 LBS IN 1 WEEK 12/24/23  Yes Debera Jayson MATSU, MD  zinc  sulfate 220 (50 Zn) MG capsule Take 220 mg by mouth every evening.   Yes [provider]  ACCU-CHEK AVIVA PLUS test strip  07/09/14   [provider]  Blood Glucose Monitoring Suppl (ONE TOUCH ULTRA 2) W/DEVICE KIT by Does not apply route.    [provider]  RELION PEN NEEDLE 31G/8MM 31G X 8 MM MISC 3 (three) times daily. 08/31/21   [provider]    Current Facility-Administered Medications  Medication Dose Route Frequency Provider Last Rate Last Admin   0.9 %  sodium chloride  infusion (Manually program via Guardrails IV Fluids)   Intravenous Once Madera, Carlos, MD       acetaminophen  (TYLENOL ) tablet 650 mg  650 mg Oral Q6H PRN Ricky Fines, MD   650 mg at 01/09/24 2027   Or   acetaminophen  (TYLENOL ) suppository 650 mg  650 mg Rectal Q6H PRN Ricky Fines, MD       albuterol  (PROVENTIL ) (2.5 MG/3ML) 0.083% nebulizer solution 2.5 mg  2.5 mg Nebulization Q6H PRN Ricky Fines, MD       allopurinol  (ZYLOPRIM ) tablet 100 mg  100 mg Oral Daily Ricky Fines, MD   100 mg at 01/09/24 1632   budesonide -glycopyrrolate -formoterol  (BREZTRI ) 160-9-4.8 MCG/ACT inhaler 2 puff  2 puff Inhalation BID Ricky Fines, MD       ezetimibe  (ZETIA ) tablet 10 mg  10 mg Oral q AM Ricky Fines, MD       furosemide  (LASIX ) injection 20 mg  20 mg Intravenous Once Madera, Carlos, MD       insulin  aspart (novoLOG ) injection 0-5 Units  0-5 Units Subcutaneous QHS Ricky Fines, MD       insulin  aspart (novoLOG ) injection 0-9 Units  0-9 Units Subcutaneous TID WC Ricky Fines, MD   1 Units at 01/09/24 8367   insulin  glargine-yfgn (SEMGLEE ) injection 5 Units  5 Units Subcutaneous QHS Ricky Fines, MD   5 Units at 01/09/24 2110   loratadine  (CLARITIN ) tablet 10 mg  10 mg Oral q AM Ricky Fines, MD       melatonin tablet 3 mg  3 mg Oral QHS Arrien,  Elidia Sieving, MD   3 mg at 01/09/24 2210   metoprolol   succinate (TOPROL -XL) 24 hr tablet 200 mg  200 mg Oral Daily Ricky Fines, MD       omega-3 acid ethyl esters (LOVAZA ) capsule 2 g  2 g Oral BID Ricky Fines, MD   2 g at 01/09/24 2109   ondansetron  (ZOFRAN ) tablet 4 mg  4 mg Oral Q6H PRN Ricky Fines, MD       Or   ondansetron  (ZOFRAN ) injection 4 mg  4 mg Intravenous Q6H PRN Ricky Fines, MD       oxyCODONE -acetaminophen  (PERCOCET/ROXICET) 5-325 MG per tablet 1 tablet  1 tablet Oral Q6H PRN Arrien, Elidia Sieving, MD   1 tablet at 01/10/24 0429   rosuvastatin  (CRESTOR ) tablet 40 mg  40 mg Oral Daily Ricky Fines, MD   40 mg at 01/09/24 1632    Allergies as of 01/09/2024 - Review Complete 01/09/2024  Allergen Reaction Noted   Codeine Anaphylaxis, Shortness Of Breath, and Other (See Comments) 08/24/2008    Family History  Problem Relation Age of Onset   Raynaud syndrome Mother    Stroke Father    Diabetes Mellitus II Father    Hypertension Father    Hypertension Sister    Diabetes Mellitus II Sister    Stroke Brother    Crohn's disease Brother    Breast cancer Brother    COPD Brother     Social History   Socioeconomic History   Marital status: Married    Spouse name: Keionte Swicegood   Number of children: 3   Years of education: 14   Highest education level: Some college, no degree  Occupational History    Comment: retired  Tobacco Use   Smoking status: Some Days    Current packs/day: 0.00    Average packs/day: 2.5 packs/day for 50.0 years (125.0 ttl pk-yrs)    Types: E-cigarettes, Cigarettes    Start date: 06/16/1961    Last attempt to quit: 06/17/2011    Years since quitting: 12.5    Passive exposure: Past   Smokeless tobacco: Never  Vaping Use   Vaping status: Former   Start date: 07/06/2017   Quit date: 04/19/2021   Substances: Flavoring  Substance and Sexual Activity   Alcohol use: Yes    Comment: occasional beer less than once a month   Drug use: No   Sexual activity: Not on file  Other Topics  Concern   Not on file  Social History Narrative   Consumes no caffeine   Social Drivers of Corporate investment banker Strain: Low Risk  (05/01/2021)   Overall Financial Resource Strain (CARDIA)    Difficulty of Paying Living Expenses: Not hard at all  Food Insecurity: No Food Insecurity (01/09/2024)   Hunger Vital Sign    Worried About Running Out of Food in the Last Year: Never true    Ran Out of Food in the Last Year: Never true  Transportation Needs: No Transportation Needs (01/09/2024)   PRAPARE - Administrator, Civil Service (Medical): No    Lack of Transportation (Non-Medical): No  Physical Activity: Not on file  Stress: Not on file  Social Connections: Unknown (01/09/2024)   Social Connection and Isolation Panel    Frequency of Communication with Friends and Family: Twice a week    Frequency of Social Gatherings with Friends and Family: Twice a week    Attends Religious Services: 1 to 4 times per year  Active Member of Clubs or Organizations: Patient declined    Attends Banker Meetings: Patient declined    Marital Status: Married  Catering manager Violence: Not At Risk (01/09/2024)   Humiliation, Afraid, Rape, and Kick questionnaire    Fear of Current or Ex-Partner: No    Emotionally Abused: No    Physically Abused: No    Sexually Abused: No     Review of Systems   Gen: Denies any fever, chills, loss of appetite, change in weight or weight loss CV: Denies chest pain, heart palpitations, syncope, edema  Resp: Denies shortness of breath with rest, cough, wheezing, coughing up blood, and pleurisy. GI: Denies vomiting blood, jaundice, and fecal incontinence.   Denies dysphagia or odynophagia. GU : Denies urinary burning, blood in urine, urinary frequency, and urinary incontinence. MS: Denies joint pain, limitation of movement, swelling, cramps, and atrophy.  Derm: Denies rash, itching, dry skin, hives. Psych: Denies depression, anxiety, memory  loss, hallucinations, and confusion. Heme: Denies bruising or bleeding Neuro:  Denies any headaches, dizziness, paresthesias, shaking  Physical Exam   Vital Signs in last 24 hours: Temp:  [97.5 F (36.4 C)-98 F (36.7 C)] 97.6 F (36.4 C) (07/07 0408) Pulse Rate:  [70-72] 70 (07/07 0408) Resp:  [16-20] 20 (07/07 0408) BP: (94-117)/(40-67) 101/45 (07/07 0408) SpO2:  [99 %-100 %] 100 % (07/07 0408) Weight:  [116.6 kg-125.5 kg] 125.5 kg (07/06 1619) Last BM Date : 01/09/24  General:   Alert,  Well-developed, well-nourished, pleasant and cooperative in NAD Head:  Normocephalic and atraumatic. Eyes:  Sclera clear, no icterus.   Ears:  mild hard of hearing Lungs:  Clear throughout to auscultation.   Heart:  S1 S2 present, no obvious murmur Abdomen:  Soft, obese, nontender and nondistended.  Normal bowel sounds, without guarding, and without rebound.   Rectal: deferred   Extremities:  left knee with large hematoma, skin blistering and oozing, left lateral thigh with developing hematoma, firm, edematous, painful to touch Neurologic:  Alert and  oriented x4. Skin:  right flank with resolving bruising, no TTP, no edema.  Psych:  Alert and cooperative. Normal mood and affect.  Intake/Output from previous day: 07/06 0701 - 07/07 0700 In: 614.1 [I.V.:114.1; IV Piggyback:500] Out: -  Intake/Output this shift: No intake/output data recorded.   Labs/Studies   Recent Labs Recent Labs    01/09/24 1323 01/10/24 0445  WBC 5.9 7.0  HGB 8.2* 7.1*  HCT 28.3* 24.3*  PLT 138* 136*   BMET Recent Labs    01/09/24 1221 01/09/24 1323 01/10/24 0445  NA 142 142 142  K 5.1 5.2* 5.3*  CL 99 100 103  CO2 31 31 34*  GLUCOSE 194* 195* 137*  BUN 28* 27* 27*  CREATININE 1.55* 1.54* 1.55*  CALCIUM  8.5* 8.6* 8.4*   LFT Recent Labs    01/09/24 1323 01/09/24 1409  PROT 5.5* 5.6*  ALBUMIN 3.1* 3.1*  AST 65* 67*  ALT 94* 95*  ALKPHOS 104 103  BILITOT 0.9 0.8  BILIDIR  --  0.1  IBILI   --  0.7   PT/INR Recent Labs    01/09/24 1409  LABPROT 18.6*  INR 1.5*   Hepatitis Panel No results for input(s): HEPBSAG, HCVAB, HEPAIGM, HEPBIGM in the last 72 hours. C-Diff No results for input(s): CDIFFTOX in the last 72 hours.  Radiology/Studies DG Knee Complete 4 Views Left Result Date: 01/09/2024 CLINICAL DATA:  Fall EXAM: LEFT KNEE - COMPLETE 4+ VIEW COMPARISON:  None Available. FINDINGS:  No fracture of the proximal tibia or distal femur. Patella is normal. No joint effusion. IMPRESSION: No fracture or dislocation. Electronically Signed   By: Jackquline Boxer M.D.   On: 01/09/2024 12:46     Assessment   ZAXTON ANGERER is a 78 y.o. year old male  with multi-morbidities including CKD, COPD  CHF, diabetes, afib on Eliquis , hypertension, cirrhosis, frequent falls with most recent fall on Saturday evening but no LOC or head injury.  GI now consulted due to Hgb 8.2 on admission and 7.1 this morning in setting of Eliquis  but heme negative.   Acute blood loss anemia: heme negative. Denies any overt GI bleeding now or recently. Physical exam with large hematoma of left knee, oozing, skin stretched and blistering, along with left lateral thigh edematous and with bruising after fall on Saturday. Notably, he also had a fall in June and presented to the ED with Hgb of 10.5, which was down from his baseline of 13. At that time, he had right flank bruising, multiple right posterior rib fractures, fractures of the right L1-L2 transverse processes, no pneumothorax. Discussed with patient that blood loss is secondary to hematomas s/p several falls. As he is not overtly bleeding and heme negative, no procedure indicated. He would be high risk for colonoscopy/EGD in light of prior hypoxic even at time of colonoscopy in the past per clinic notes.   Cirrhosis: recent evaluation including liver biopsy as had positive AMA but path not suggestive of PBC, autoimmune. MELD 3.0 was 15 yesterday,  although in setting of Eliquis , INR could be slightly affected. Recommend follow-up as outpatient with Dr. Cinderella.     Plan / Recommendations    Discussed with Dr. Ricky, who will be ordering CT left knee and discussing with ortho GI will sign off. He can be seen in follow-up as outpatient for cirrhosis care      01/10/2024, 9:47 AM  Therisa MICAEL Stager, PhD, ANP-BC [facility]

## 2024-01-10 NOTE — TOC Initial Note (Signed)
 Transition of Care [facility]) - Initial/Assessment Note    Patient Details  Name: Tommy Riley MRN: [id number] Date of Birth: 05-25-1946  Transition of Care Tommy [facility]) CM/SW Contact:    Tommy Riley, LCSWA Phone Number: 01/10/2024, 1:41 PM  Clinical Narrative:                  Patient is at risk for readmission due to high admission score. Patient was admitted for AKI (acute kidney injury). Patient lives at home with spouse. Patient shared that he is independent at home, has walkers, a rollator, cane, CPAP, nebulizer machine, and leg boots. Patient is agreeable to HHPT. CSW reached out to Tommy Riley to see if Tommy Riley can accept since he lives in [place]. Pending on acceptance for HHPT. TOC to follow.   Expected Discharge Plan: Home w Home Health Services     Patient Goals and CMS Choice Patient states their goals for this hospitalization and ongoing recovery are:: return back home CMS Medicare.gov Compare Post Acute Care list provided to:: Patient Choice offered to / list presented to : Patient, Spouse      Expected Discharge Plan and Services In-house Referral: Clinical Social Work Discharge Planning Services: CM Consult   Living arrangements for the past 2 months: Single Family Home                           HH Arranged: PT HH Agency: [facility] Date [facility] Agency Contacted: 01/10/24 Time HH Agency Contacted: [phone] Representative spoke with at [facility] Agency: [name]  Prior Living Arrangements/Services Living arrangements for the past 2 months: Single Family Home Lives with:: Pets, Spouse Patient language and need for interpreter reviewed:: Yes Do you feel safe going back to the place where you live?: Yes      Need for Family Participation in Patient Care: Yes (Comment) Care giver support system in place?: No (comment) Current home services: DME Criminal Activity/Legal Involvement Pertinent to Current Situation/Hospitalization: No - Comment as  needed  Activities of Daily Living   ADL Screening (condition at time of admission) Independently performs ADLs?: No Does the patient have a NEW difficulty with bathing/dressing/toileting/self-feeding that is expected to last >3 days?: No Does the patient have a NEW difficulty with getting in/out of bed, walking, or climbing stairs that is expected to last >3 days?: No Does the patient have a NEW difficulty with communication that is expected to last >3 days?: No Is the patient deaf or have difficulty hearing?: No Does the patient have difficulty seeing, even when wearing glasses/contacts?: No Does the patient have difficulty concentrating, remembering, or making decisions?: No  Permission Sought/Granted      Share Information with NAME: Tommy Riley     Permission granted to share info w Relationship: Patient     Emotional Assessment Appearance:: Appears stated age Attitude/Demeanor/Rapport: Engaged Affect (typically observed): Accepting, Appropriate Orientation: : Oriented to Self, Oriented to Place, Oriented to  Time, Oriented to Situation Alcohol / Substance Use: Not Applicable Psych Involvement: No (comment)  Admission diagnosis:  AKI (acute kidney injury) (HCC) [N17.9] Anemia, unspecified type [D64.9] Patient Active Problem List   Diagnosis Date Noted   AKI (acute kidney injury) (HCC) 01/09/2024   Back pain due to injury 12/28/2023   Antimitochondrial antibody positive 08/04/2023   Elevated liver enzymes 06/14/2023   Cirrhosis of liver without ascites (HCC) 06/14/2023   BMI 37.0-37.9, adult 06/14/2023   Ischemic cardiomyopathy 10/24/2021   Gout 08/30/2021  CKD (chronic kidney disease) stage 2, GFR 60-89 ml/min 08/30/2021   CHF (congestive heart failure) (HCC) 08/28/2021   Biventricular ICD (implantable cardioverter-defibrillator) in place 08/21/2021   Elevated brain natriuretic peptide (BNP) level 04/13/2021   Elevated troponin I level 04/13/2021   Hyperglycemia due to  diabetes mellitus (HCC) 04/13/2021   Acute respiratory failure with hypoxia (HCC) 04/13/2021   Shortness of breath 04/13/2021   Fall at home, initial encounter 04/13/2021   Ecchymosis 04/13/2021   Pneumonia due to COVID-19 virus 04/04/2020   Chronic respiratory failure with hypoxia (HCC) 08/29/2018   Other secondary pulmonary hypertension (HCC) 07/29/2018   Cough 07/29/2018   Non-nicotine vapor product user 07/29/2018   COPD (chronic obstructive pulmonary disease) (HCC) 12/14/2014   ASHD (arteriosclerotic heart disease) 12/14/2014   S/P CABG (coronary artery bypass graft) 12/14/2014   Permanent atrial fibrillation (HCC) 12/14/2014   HBP (high blood pressure) 12/14/2014   Acute on chronic systolic heart failure (HCC) 12/14/2014   Obesity 12/14/2014   OSA on CPAP 12/14/2014   Type 2 diabetes mellitus with hyperlipidemia (HCC) 12/14/2014   PCP:  Marchelle Clem Pitts, MD Pharmacy:   [facility] [address] [place], [place] - [address] [place] [address] Phone: [phone] Fax: [phone]  [facility] - [place], [place] - [address] [place] [address] Phone: [phone] Fax: [phone]     Social Drivers of Health (SDOH) Social History: SDOH Screenings   Food Insecurity: No Food Insecurity (01/09/2024)  Housing: Low Risk  (01/09/2024)  Transportation Needs: No Transportation Needs (01/09/2024)  Utilities: Not At Risk (01/09/2024)  Alcohol Screen: Low Risk  (05/01/2021)  Financial Resource Strain: Low Risk  (05/01/2021)  Social Connections: Unknown (01/09/2024)  Tobacco Use: High Risk (01/09/2024)   SDOH Interventions:     Readmission Risk Interventions     No data to display

## 2024-01-10 NOTE — Telephone Encounter (Signed)
 Devere: please arrange outpatient appt with Dr. Cinderella in about 3-4 weeks. Thanks

## 2024-01-11 ENCOUNTER — Inpatient Hospital Stay (HOSPITAL_COMMUNITY)

## 2024-01-11 DIAGNOSIS — S8012XA Contusion of left lower leg, initial encounter: Secondary | ICD-10-CM | POA: Diagnosis not present

## 2024-01-11 DIAGNOSIS — D649 Anemia, unspecified: Secondary | ICD-10-CM | POA: Diagnosis not present

## 2024-01-11 DIAGNOSIS — D62 Acute posthemorrhagic anemia: Secondary | ICD-10-CM

## 2024-01-11 DIAGNOSIS — N179 Acute kidney failure, unspecified: Secondary | ICD-10-CM | POA: Diagnosis not present

## 2024-01-11 DIAGNOSIS — K7469 Other cirrhosis of liver: Secondary | ICD-10-CM

## 2024-01-11 DIAGNOSIS — S8002XA Contusion of left knee, initial encounter: Secondary | ICD-10-CM

## 2024-01-11 LAB — CBC
HCT: 23.8 % — ABNORMAL LOW (ref 39.0–52.0)
Hemoglobin: 7.3 g/dL — ABNORMAL LOW (ref 13.0–17.0)
MCH: 30.3 pg (ref 26.0–34.0)
MCHC: 30.7 g/dL (ref 30.0–36.0)
MCV: 98.8 fL (ref 80.0–100.0)
Platelets: 154 K/uL (ref 150–400)
RBC: 2.41 MIL/uL — ABNORMAL LOW (ref 4.22–5.81)
RDW: 17.3 % — ABNORMAL HIGH (ref 11.5–15.5)
WBC: 8.8 K/uL (ref 4.0–10.5)
nRBC: 0.3 % — ABNORMAL HIGH (ref 0.0–0.2)

## 2024-01-11 LAB — BASIC METABOLIC PANEL WITH GFR
Anion gap: 9 (ref 5–15)
BUN: 35 mg/dL — ABNORMAL HIGH (ref 8–23)
CO2: 29 mmol/L (ref 22–32)
Calcium: 8.4 mg/dL — ABNORMAL LOW (ref 8.9–10.3)
Chloride: 100 mmol/L (ref 98–111)
Creatinine, Ser: 1.69 mg/dL — ABNORMAL HIGH (ref 0.61–1.24)
GFR, Estimated: 42 mL/min — ABNORMAL LOW (ref >60)
Glucose, Bld: 199 mg/dL — ABNORMAL HIGH (ref 70–99)
Potassium: 5.2 mmol/L — ABNORMAL HIGH (ref 3.5–5.1)
Sodium: 138 mmol/L (ref 135–145)

## 2024-01-11 LAB — GLUCOSE, CAPILLARY
Glucose-Capillary: 149 mg/dL — ABNORMAL HIGH (ref 70–99)
Glucose-Capillary: 191 mg/dL — ABNORMAL HIGH (ref 70–99)
Glucose-Capillary: 153 mg/dL — ABNORMAL HIGH (ref 70–99)
Glucose-Capillary: 172 mg/dL — ABNORMAL HIGH (ref 70–99)

## 2024-01-11 LAB — PREPARE RBC (CROSSMATCH)

## 2024-01-11 MED ORDER — SODIUM CHLORIDE 0.9% IV SOLUTION: Freq: Once | INTRAVENOUS | Status: AC

## 2024-01-11 MED ORDER — DAPAGLIFLOZIN PROPANEDIOL 10 MG PO TABS
10.0000 mg | ORAL_TABLET | Freq: Every day | ORAL | Status: DC
  Administered 2024-01-11 – 2024-01-18 (×8): 10 mg via ORAL
  Filled 2024-01-11 (×8): qty 1

## 2024-01-11 NOTE — Discharge Instructions (Signed)
 [facility] [address] [place], [place] [address] [phone]  Someone from [place] will contact you to schedule home physical therapy.

## 2024-01-11 NOTE — Progress Notes (Signed)
 Progress Note   Patient: Tommy Riley FMW:[id number] DOB: 1946/06/05 DOA: 01/09/2024     2 DOS: the patient was seen and examined on 01/11/2024   Brief hospital admission narrative: Tommy Riley is a 78 y.o. male with medical history significant of history of atrial flutter/atrial fibrillation on chronic anticoagulation, chronic kidney disease stage II, hypertension, type 2 diabetes mellitus with nephropathy, chronic respiratory failure with hypoxia due to COPD, hyperlipidemia, lymphedema and history of class II obesity; who presented to the hospital after mechanical fall.  Patient reports no chest pain, no shortness of breath, no nausea, no vomiting, no prodrome make symptoms and essentially just tripping causing his fall.  Significant bruise appreciated on his knees; patient expressed no hitting his head.   Workup significant for positive decrease in patient's hemoglobin into the 8.2 range (chronically around 10-13); elevated MCV at 100.4 and comprehensive metabolic panel demonstrating acute kidney injury creatinine of 1.54 and potassium 5.2.  Fecal occult blood test negative and patient reported no dysuria, hematuria, melena or hematemesis.   GI service consulted and TRH contacted to place patient in the hospital for further evaluation and management.  Assessment and plan 1-mechanical fall - Continue as needed analgesics and follow following physical therapy evaluation and recommendations plan is for patient to return home with home health PT.  2-anemia in a patient on chronic anticoagulation - No overt bleeding appreciated - Hemoglobin down to 7.3 on today's exam - Will provide another unit of PRBCs transfusion and follow hemoglobin trend.  Goal is for hemoglobin above 8 as much as possible. - Patient high risk for endoscopic evaluation and in the absence of overt bleeding and negative fecal occult blood test we will continue to follow trend - Continue holding anticoagulations - High  concerns for significant left leg hematoma after mechanical fall; CT scan confirming very extensive hematoma affecting left lower leg.  Case discussed with orthopedic service who recommended no surgical intervention at the moment. - Continue supportive care and follow clinical response.  3-history of paroxysmal atrial fibrillation - Continue metoprolol  for rate control - Continue holding Eliquis  - Continue telemetry monitoring and follow electrolytes trend.  4-type 2 diabetes mellitus with nephropathy - Chronic kidney disease stage II at baseline - Continue to maintain adequate hydration, follow renal function trend and continue sliding scale insulin  and Semglee   5-hypertension - Continue metoprolol  - Holding other antihypertensive agents and diuretics in the setting of acute kidney injury  6-chronic diastolic heart failure - Continue daily weights, strict I's and O's and low-sodium diet - Holding Demadex , losartan  and spironolactone  in the setting of acute kidney injury.  7-acute kidney injury on chronic kidney disease - Stage II at baseline - Continue to minimize nephrotoxic agent - Follow renal function trend.  8-class II obesity - Follow low-calorie diet and portion control -Body mass index is 38.47 kg/m.   9-history of gout - Acute exacerbation: Appreciate - Continue treatment with allopurinol .  10-chronic respiratory failure with hypoxia/COPD/OSA - Continue CPAP nightly - Continue chronic oxygen supplementation - No acute exacerbation appreciated and will continue home bronchodilator management..   Subjective: No overt bleeding; denies chest pain, no nausea, no vomiting, and no palpitations.  Good saturation on chronic supplementation.  Expressing feeling short winded with activity and having pain on his left lower extremity.  Physical Exam: Vitals:   01/11/24 1255 01/11/24 1431 01/11/24 1515 01/11/24 1619  BP: (!) 100/39 (!) 94/51 (!) 98/37 (!) 107/51  Pulse: 71  71 70 70  Resp:  20 (!) 21 20 19   Temp: 97.7 F (36.5 C) 97.6 F (36.4 C) 98 F (36.7 C) 98.1 F (36.7 C)  TempSrc: Oral Oral Oral Oral  SpO2: 100% 99% 99% 100%  Weight:      Height:       General exam: Alert, awake, oriented x 3; no chest pain, no nausea, no vomiting.  Reports pain on his left lower extremity and has noticed further expansion of his in hematoma/bruise. Respiratory system: Good saturation on chronic supplementation; patient reports feeling slightly short winded with activity.  No frank crackles on exam. Cardiovascular system: Rate controlled, no rubs, no gallops, unable to properly assess JVD with body habitus. Gastrointestinal system: Abdomen is obese, nondistended, soft and nontender. No organomegaly or masses felt. Normal bowel sounds heard. Central nervous system: No focal neurological deficits. Extremities/skin: No cyanosis or clubbing; significant decreased range of motion due to pain on his left lower extremity and extensive tense sensation/bruising and hematoma appreciated involving his left knee from total and lateral aspect extending into his legs.  Right lower extremity with compression stockings in place. Psychiatry: Judgement and insight appear normal. Mood & affect appropriate.   Data Reviewed: CBC: WBCs 8.8, hemoglobin 7.3 and platelet count 154K Basic metabolic panel:Sodium 138, potassium 5.2, chloride 100, bicarb 29, glucose 199, BUN 35, creatinine 1.69 and GFR 42  Family Communication: No family at bedside.  Disposition: Status is: Inpatient Remains inpatient appropriate because: Continue IV therapy.  Will follow physical therapy recommendations for discharge safest pathway once medically stable.  Time spent: 50 minutes  Author: Eric Nunnery, MD 01/11/2024 4:32 PM  For on call review [email or web address].

## 2024-01-11 NOTE — Consult Note (Addendum)
 ORTHOPAEDIC CONSULTATION  REQUESTING PHYSICIAN: Ricky Fines, MD  ASSESSMENT AND PLAN: 78 y.o. male with complicated medical history including COPD congestive heart failure atrial fibrillation currently with a defibrillator and pacemaker presented on Eliquis  with a large hematoma secondary to fall.  Hematoma is located in the left lower extremity involving the left knee proximal left leg and distal to mid lateral thigh  In my opinion the patient should not undergo surgery in our hospital setting.  I think this can resolve itself with time.  I would be aggressive with his hemoglobin and transfuse him up to 9 before discharge.  He is also at risk for not healing an incision because he has poor vascularity from the peripheral arterial disease, long history of smoking  Chief Complaint: Swelling left lower extremity  HPI: Tommy Riley is a 78 y.o. male with large hematoma left lower extremity after falling on Saturday.  By 12 hours later his leg was swollen black and blue and this was expanding.  He presented to the hospital with a large hematoma and imaging studies consistent with hematoma left proximal leg anterior knee and lateral thigh.  His hemoglobin is dropped to 7.3 despite 1 unit of blood.  The lowest it was 7.1.  He also had a fall several weeks ago his hemoglobin went from 13.8-10.  He injured his back and several ribs at that time.  Patient noted frequent falls recently.  Past Medical History:  Diagnosis Date   Anxiety    Atrial fibrillation and flutter (HCC)    Atrial fibrillation ablation 2011 at [facility], [place]   CAD in native artery    Chronic systolic heart failure (HCC)    CKD (chronic kidney disease), stage III (HCC)    COPD (chronic obstructive pulmonary disease) (HCC)    COVID-22 February 2020   Depression    Essential hypertension    Gout    Hyperlipidemia    Ischemic heart disease    OSA (obstructive sleep apnea)    Stroke (HCC) 2003   Type 2 diabetes mellitus  (HCC)    Past Surgical History:  Procedure Laterality Date   ATRIAL FIBRILLATION ABLATION  2011   [facility]   AV NODE ABLATION N/A 05/07/2021   Procedure: AV NODE ABLATION;  Surgeon: Waddell Danelle ORN, MD;  Location: [facility] INVASIVE CV LAB;  Service: Cardiovascular;  Laterality: N/A;   AV NODE ABLATION N/A 09/01/2021   Procedure: AV NODE ABLATION;  Surgeon: Waddell Danelle ORN, MD;  Location: [facility] INVASIVE CV LAB;  Service: Cardiovascular;  Laterality: N/A;   CATARACT EXTRACTION Bilateral    CORONARY ARTERY BYPASS GRAFT  2001   HERNIA REPAIR Right    ICD IMPLANT N/A 05/07/2021   Procedure: ICD IMPLANT;  Surgeon: Waddell Danelle ORN, MD;  Location: [facility] INVASIVE CV LAB;  Service: Cardiovascular;  Laterality: N/A;   RIGHT/LEFT HEART CATH AND CORONARY/GRAFT ANGIOGRAPHY N/A 05/05/2021   Procedure: RIGHT/LEFT HEART CATH AND CORONARY/GRAFT ANGIOGRAPHY;  Surgeon: Court Dorn PARAS, MD;  Location: [facility] INVASIVE CV LAB;  Service: Cardiovascular;  Laterality: N/A;   TONSILLECTOMY AND ADENOIDECTOMY     Social History   Socioeconomic History   Marital status: Married    Spouse name: Harjit Leider   Number of children: 3   Years of education: 14   Highest education level: Some college, no degree  Occupational History    Comment: retired  Tobacco Use   Smoking status: Some Days    Current packs/day: 0.00  Average packs/day: 2.5 packs/day for 50.0 years (125.0 ttl pk-yrs)    Types: E-cigarettes, Cigarettes    Start date: 06/16/1961    Last attempt to quit: 06/17/2011    Years since quitting: 12.5    Passive exposure: Past   Smokeless tobacco: Never  Vaping Use   Vaping status: Former   Start date: 07/06/2017   Quit date: 04/19/2021   Substances: Flavoring  Substance and Sexual Activity   Alcohol use: Yes    Comment: occasional beer less than once a month   Drug use: No   Sexual activity: Not on file  Other Topics Concern   Not on file  Social History Narrative   Consumes no caffeine   Social Drivers of  Corporate investment banker Strain: Low Risk  (05/01/2021)   Overall Financial Resource Strain (CARDIA)    Difficulty of Paying Living Expenses: Not hard at all  Food Insecurity: No Food Insecurity (01/09/2024)   Hunger Vital Sign    Worried About Running Out of Food in the Last Year: Never true    Ran Out of Food in the Last Year: Never true  Transportation Needs: No Transportation Needs (01/09/2024)   PRAPARE - Administrator, Civil Service (Medical): No    Lack of Transportation (Non-Medical): No  Physical Activity: Not on file  Stress: Not on file  Social Connections: Unknown (01/09/2024)   Social Connection and Isolation Panel    Frequency of Communication with Friends and Family: Twice a week    Frequency of Social Gatherings with Friends and Family: Twice a week    Attends Religious Services: 1 to 4 times per year    Active Member of [employer] or Organizations: Patient declined    Attends Engineer, structural: Patient declined    Marital Status: Married   Family History  Problem Relation Age of Onset   Raynaud syndrome Mother    Stroke Father    Diabetes Mellitus II Father    Hypertension Father    Hypertension Sister    Diabetes Mellitus II Sister    Stroke Brother    Crohn's disease Brother    Breast cancer Brother    COPD Brother    Allergies  Allergen Reactions   Codeine Anaphylaxis, Shortness Of Breath and Other (See Comments)    Tightness in chest, Chest pain   Prior to Admission medications   Medication Sig Start Date End Date Taking? Authorizing Provider  albuterol  (PROVENTIL ) (2.5 MG/3ML) 0.083% nebulizer solution Take 3 mLs (2.5 mg total) by nebulization every 6 (six) hours as needed for wheezing or shortness of breath. 12/31/23  Yes Parrett, Tammy S, NP  albuterol  (VENTOLIN  HFA) 108 (90 Base) MCG/ACT inhaler USE 2 INHALATIONS BY MOUTH EVERY 4 HOURS AS NEEDED FOR SHORTNESS  OF BREATH OR WHEEZE 10/19/23  Yes Cobb, Alyssa L, NP  allopurinol   (ZYLOPRIM ) 100 MG tablet Taking two tablets by mouth in the morning and 1 tablet by mouth in the evening   Yes [provider]  apixaban  (ELIQUIS ) 5 MG TABS tablet TAKE 1 TABLET BY MOUTH TWICE  DAILY 09/29/23  Yes Debera Jayson MATSU, MD  Ascorbic Acid  (VITAMIN C PO) Take 2,000 mg by mouth daily.   Yes [provider]  aspirin  81 MG EC tablet Take 81 mg by mouth in the morning.   Yes [provider]  Cholecalciferol  (VITAMIN D3) 2000 UNITS TABS Take 1 capsule by mouth in the morning and at bedtime.   Yes  [provider]  Continuous Glucose Sensor (DEXCOM G7 SENSOR) MISC See admin instructions. 06/21/23  Yes [provider]  dapagliflozin  propanediol (FARXIGA ) 10 MG TABS tablet Take 1 tablet (10 mg total) by mouth daily. 10/28/23  Yes Debera Jayson MATSU, MD  diphenhydramine -acetaminophen  (TYLENOL  PM) 25-500 MG TABS tablet Take 2 tablets by mouth at bedtime.   Yes [provider]  ezetimibe  (ZETIA ) 10 MG tablet Take 10 mg by mouth in the morning.   Yes [provider]  fluticasone  (FLONASE ) 50 MCG/ACT nasal spray Place 1 spray into both nostrils daily. 01/11/23  Yes Sood, Vineet, MD  Fluticasone -Umeclidin-Vilant (TRELEGY ELLIPTA ) 100-62.5-25 MCG/ACT AEPB Inhale 1 puff into the lungs daily. 08/06/23  Yes Cobb, Comer GAILS, NP  folic acid  (FOLVITE ) 400 MCG tablet Take 400 mcg by mouth every evening.   Yes [provider]  insulin  aspart (NOVOLOG  FLEXPEN) 100 UNIT/ML FlexPen Inject 2-20 Units into the skin See admin instructions. As directed twice daily sliding scale 08/09/23  Yes [provider]  insulin  degludec (TRESIBA) 100 UNIT/ML FlexTouch Pen Inject 50 Units into the skin 2 (two) times daily.   Yes [provider]  loratadine  (CLARITIN ) 10 MG tablet Take 10 mg by mouth in the morning.   Yes [provider]  losartan  (COZAAR ) 50 MG tablet TAKE 1 TABLET BY MOUTH DAILY 07/23/23  Yes Debera Jayson MATSU, MD   melatonin 5 MG TABS Take 5 mg by mouth at bedtime.   Yes [provider]  metoprolol  (TOPROL -XL) 200 MG 24 hr tablet TAKE 1 TABLET BY MOUTH DAILY  WITH OR IMMEDIATELY FOLLOWING A  MEAL 08/02/23  Yes Debera Jayson MATSU, MD  Multiple Vitamins-Minerals (CENTRUM CARDIO) TABS Take 1 tablet by mouth in the morning.   Yes [provider]  omega-3 acid ethyl esters (LOVAZA ) 1 g capsule Take 2 g by mouth 2 (two) times daily.   Yes [provider]  oxyCODONE -acetaminophen  (PERCOCET/ROXICET) 5-325 MG tablet Take 1 every 6 hours for pain that is not relieved by Tylenol . Patient taking differently: Take 1 tablet by mouth every 6 (six) hours as needed for moderate pain (pain score 4-6). Take 1/2 to 1 tablet every 6 hours for pain that is not relieved by Tylenol . 12/28/23  Yes Zammit, Joseph, MD  oxymetazoline  (AFRIN) 0.05 % nasal spray Place 1 spray into both nostrils as needed for congestion.   Yes [provider]  OZEMPIC, 0.25 OR 0.5 MG/DOSE, 2 MG/3ML SOPN Inject 1 mg into the skin once a week. On Fridays 10/16/22  Yes [provider]  potassium chloride  SA (KLOR-CON  M) 20 MEQ tablet TAKE 3 TABLETS BY MOUTH 3 TIMES  DAILY 06/01/23  Yes Debera Jayson MATSU, MD  rosuvastatin  (CRESTOR ) 40 MG tablet TAKE 1 TABLET BY MOUTH DAILY 12/21/23  Yes Debera Jayson MATSU, MD  sertraline  (ZOLOFT ) 100 MG tablet Take 150 mg by mouth in the morning. 08/04/21  Yes [provider]  spironolactone  (ALDACTONE ) 25 MG tablet TAKE 1 TABLET BY MOUTH DAILY 12/21/23  Yes Debera Jayson MATSU, MD  torsemide  (DEMADEX ) 20 MG tablet Take 1 tablet (20 mg total) by mouth as directed. TAKE 4 TABLETS BY MOUTH TWICE  DAILY MAY TAKE AN ADDITIONAL 1  TABLET BY MOUTH DAILY AS NEEDED  FOR 2 TO 3 LBS WEIGHT GAIN IN 1  DAY OR 5 LBS IN 1 WEEK 12/24/23  Yes Debera Jayson MATSU, MD  zinc  sulfate 220 (50 Zn) MG capsule Take 220 mg by mouth every evening.  Yes [provider]  ACCU-CHEK AVIVA PLUS test  strip  07/09/14   [provider]  Blood Glucose Monitoring Suppl (ONE TOUCH ULTRA 2) W/DEVICE KIT by Does not apply route.    [provider]  RELION PEN NEEDLE 31G/8MM 31G X 8 MM MISC 3 (three) times daily. 08/31/21   [provider]   CT KNEE LEFT WO CONTRAST  Independent interpretation  His CT scan shows large hematoma in his lower leg which extends up and around the knee  No fracture observed at this time   result Date: 01/10/2024 CLINICAL DATA:  Knee pain and trauma EXAM: CT OF THE LEFT KNEE WITHOUT CONTRAST TECHNIQUE: Multidetector CT imaging of the left knee was performed according to the standard protocol. Multiplanar CT image reconstructions were also generated. RADIATION DOSE REDUCTION: This exam was performed according to the departmental dose-optimization program which includes automated exposure control, adjustment of the mA and/or kV according to patient size and/or use of iterative reconstruction technique. COMPARISON:  None Available. FINDINGS: Bones/Joint/Cartilage Generalized osteopenia. No fracture or dislocation. Normal alignment. No joint effusion. Mild chondrocalcinosis of the lateral femorotibial compartment as can be seen with CPPD. Ligaments Ligaments are suboptimally evaluated by CT. Muscles and Tendons Muscles are normal. No muscle atrophy. No intramuscular fluid collection or hematoma. Soft tissue Large hyperdense fluid collection along the lateral aspect of the lower leg within the subcutaneous fat extending from the distal thigh and extending into the proximal lower leg measuring 11.1 x 5.8 x 24.3 cm consistent with a hematoma which wraps around along the inferior aspect of the knee. Additional soft tissue edema throughout the distal thigh and proximal lower leg. No soft tissue mass. Peripheral vascular atherosclerotic disease. IMPRESSION: 1. Large hematoma along the lateral aspect of the lower leg within the subcutaneous fat extending from the distal  thigh and extending into the proximal lower leg measuring 11.1 x 5.8 x 24.3 cm which wraps around along the inferior aspect of the knee. 2. No acute osseous injury of the left knee. 3.  Peripheral vascular atherosclerotic disease. Electronically Signed   By: Julaine Blanch M.D.   On: 01/10/2024 15:53   US  Renal Result Date: 01/10/2024 CLINICAL DATA:  Acute renal insufficiency. EXAM: RENAL / URINARY TRACT ULTRASOUND COMPLETE COMPARISON:  None Available. FINDINGS: Evaluation is limited due to body habitus and portable technique. Right Kidney: Renal measurements: 10.1 x 5.2 x 5.4 cm = volume: 152 mL. Mild parenchyma atrophy and mild increased echogenicity. No hydronephrosis or shadowing stone. A 1 cm lower pole cyst. Left Kidney: Renal measurements: 13.7 x 6.7 x 5.4 cm = volume: 265 mL. Mild atrophy and increased echogenicity. A 1 cm hypoechoic area in the interpolar left kidney is poorly evaluated, possibly a cyst. No hydronephrosis or shadowing stone. Bladder: Appears normal for degree of bladder distention. Other: None. IMPRESSION: Mildly atrophic and echogenic kidneys may represent chronic kidney disease. No hydronephrosis or shadowing stone. Electronically Signed   By: Vanetta Chou M.D.   On: 01/10/2024 11:01   Family History Reviewed and non-contributory, no pertinent history of problems with bleeding or anesthesia    ROS  Shortness of breath at times currently on a CPAP with 3 L of oxygen at home Back pain from recent injury, rib pain from recent injury     OBJECTIVE  Vitals:Patient Vitals for the past 8 hrs:  BP Pulse SpO2  01/11/24 0800 (!) 110/43 70 99 %   General: Alert, no acute distress Cardiovascular: Doppler required to obtain  pulse on the dorsalis pedis of the left foot, swelling and indentation of the leg and foot at the end compression stocking.  Indicates that he is close to $120 a pair.  I removed them to allow improved flow.  His skin is also discolored in this area  secondary to chronic vascular disease   Respiratory: Normal breathing with oxygen via nasal cannula  GI: No organomegaly, abdomen is soft and non-tender Skin: 2 blisters are noted over the anterior portion of the knee just inferior to the patella he also has a epidermal abrasion and hematoma directly over the patella and then the skin on the lateral portion of the thigh, across the knee and into the proximal tibial area also black and blue.  Neurologic: Awake and alert but soft touch sensation intact questionable sensory exam secondary to peripheral neuropathy Psychiatric: Patient is competent for consent with normal mood and affect Lymphatic: No swelling obvious and reported other than the area involved in the exam below   Extremities  Left leg skin as described  No compartment tenderness  Blisters as described.  Range of motion limited.  Muscle tone normal.  Instability not tested.   Labs cbc Recent Labs    01/10/24 0445 01/11/24 0909  WBC 7.0 8.8  HGB 7.1* 7.3*  HCT 24.3* 23.8*  PLT 136* 154    Labs inflam No results for input(s): CRP in the last 72 hours.  Invalid input(s): ESR  Labs coag Recent Labs    01/09/24 1409  INR 1.5*    Recent Labs    01/10/24 0445 01/11/24 0909  NA 142 138  K 5.3* 5.2*  CL 103 100  CO2 34* 29  GLUCOSE 137* 199*  BUN 27* 35*  CREATININE 1.55* 1.69*  CALCIUM  8.4* 8.4*

## 2024-01-12 ENCOUNTER — Telehealth: Payer: Self-pay | Admitting: Cardiology

## 2024-01-12 DIAGNOSIS — N179 Acute kidney failure, unspecified: Secondary | ICD-10-CM | POA: Diagnosis not present

## 2024-01-12 LAB — GLUCOSE, CAPILLARY
Glucose-Capillary: 165 mg/dL — ABNORMAL HIGH (ref 70–99)
Glucose-Capillary: 178 mg/dL — ABNORMAL HIGH (ref 70–99)
Glucose-Capillary: 151 mg/dL — ABNORMAL HIGH (ref 70–99)
Glucose-Capillary: 166 mg/dL — ABNORMAL HIGH (ref 70–99)

## 2024-01-12 LAB — CBC
HCT: 23.5 % — ABNORMAL LOW (ref 39.0–52.0)
Hemoglobin: 7.3 g/dL — ABNORMAL LOW (ref 13.0–17.0)
MCH: 30.8 pg (ref 26.0–34.0)
MCHC: 31.1 g/dL (ref 30.0–36.0)
MCV: 99.2 fL (ref 80.0–100.0)
Platelets: 151 K/uL (ref 150–400)
RBC: 2.37 MIL/uL — ABNORMAL LOW (ref 4.22–5.81)
RDW: 16.8 % — ABNORMAL HIGH (ref 11.5–15.5)
WBC: 10.4 K/uL (ref 4.0–10.5)
nRBC: 0.6 % — ABNORMAL HIGH (ref 0.0–0.2)

## 2024-01-12 LAB — BASIC METABOLIC PANEL WITH GFR
Anion gap: 14 (ref 5–15)
BUN: 40 mg/dL — ABNORMAL HIGH (ref 8–23)
CO2: 23 mmol/L (ref 22–32)
Calcium: 8.6 mg/dL — ABNORMAL LOW (ref 8.9–10.3)
Chloride: 101 mmol/L (ref 98–111)
Creatinine, Ser: 1.72 mg/dL — ABNORMAL HIGH (ref 0.61–1.24)
GFR, Estimated: 41 mL/min — ABNORMAL LOW (ref >60)
Glucose, Bld: 164 mg/dL — ABNORMAL HIGH (ref 70–99)
Potassium: 5.6 mmol/L — ABNORMAL HIGH (ref 3.5–5.1)
Sodium: 138 mmol/L (ref 135–145)

## 2024-01-12 MED ORDER — SODIUM ZIRCONIUM CYCLOSILICATE 10 G PO PACK
10.0000 g | PACK | Freq: Two times a day (BID) | ORAL | Status: AC
  Administered 2024-01-12 – 2024-01-13 (×3): 10 g via ORAL
  Filled 2024-01-12 (×3): qty 1

## 2024-01-12 MED ORDER — SODIUM CHLORIDE 0.9% IV SOLUTION: Freq: Once | INTRAVENOUS | Status: AC

## 2024-01-12 MED ORDER — MELATONIN 3 MG PO TABS
6.0000 mg | ORAL_TABLET | Freq: Every day | ORAL | Status: DC
  Administered 2024-01-12 – 2024-01-17 (×6): 6 mg via ORAL
  Filled 2024-01-12 (×6): qty 2

## 2024-01-12 NOTE — Progress Notes (Signed)
 Progress Note   Patient: Tommy Riley FMW:[id number] DOB: 1945-09-04 DOA: 01/09/2024     3 DOS: the patient was seen and examined on 01/12/2024   Brief hospital admission narrative: Tommy Riley is a 78 y.o. male with medical history significant of history of atrial flutter/atrial fibrillation on chronic anticoagulation, chronic kidney disease stage II, hypertension, type 2 diabetes mellitus with nephropathy, chronic respiratory failure with hypoxia due to COPD, hyperlipidemia, lymphedema and history of class II obesity; who presented to the hospital after mechanical fall.  Patient reports no chest pain, no shortness of breath, no nausea, no vomiting, no prodrome make symptoms and essentially just tripping causing his fall.  Significant bruise appreciated on his knees; patient expressed no hitting his head.   Workup significant for positive decrease in patient's hemoglobin into the 8.2 range (chronically around 10-13); elevated MCV at 100.4 and comprehensive metabolic panel demonstrating acute kidney injury creatinine of 1.54 and potassium 5.2.  Fecal occult blood test negative and patient reported no dysuria, hematuria, melena or hematemesis.   GI service consulted and [facility] contacted to place patient in the hospital for further evaluation and management.  Assessment and plan 1-mechanical fall - Continue as needed analgesics and follow following physical therapy evaluation and recommendations plan is for patient to return home with home health PT.  2-anemia in a patient on chronic anticoagulation - No overt bleeding appreciated - Will provide another unit of PRBCs transfusion and follow hemoglobin trend.  Goal is for hemoglobin above 8 as much as possible. - Patient high risk for endoscopic evaluation and in the absence of overt bleeding and negative fecal occult blood test we will continue to follow trend - Continue holding anticoagulations - High concerns for significant left leg  hematoma after mechanical fall; CT scan confirming very extensive hematoma affecting left lower leg.  Case discussed with orthopedic service who recommended no surgical intervention at the moment. - Continue supportive care and follow clinical response. - Transfuse additional unit of PRBC on 01/11/2022---  3-history of paroxysmal atrial fibrillation - Continue metoprolol  for rate control - Continue holding Eliquis  - Continue telemetry monitoring and follow electrolytes trend.  4-type 2 diabetes mellitus with nephropathy - Chronic kidney disease stage II at baseline - Continue to maintain adequate hydration, follow renal function trend and continue sliding scale insulin  and Semglee   5-hypertension - Continue metoprolol  - Holding other antihypertensive agents and diuretics in the setting of acute kidney injury  6-chronic diastolic heart failure - Continue daily weights, strict I's and O's and low-sodium diet - Holding Demadex , losartan  and spironolactone  in the setting of acute kidney injury.  7-acute kidney injury on chronic kidney disease - Stage II at baseline - Continue to minimize nephrotoxic agent - Follow renal function trend.  8-class II obesity - Follow low-calorie diet and portion control -Body mass index is 38.59 kg/m.   9-history of gout - Acute exacerbation: Appreciate - Continue treatment with allopurinol .  10-chronic respiratory failure with hypoxia/COPD/OSA - Continue CPAP nightly - Continue chronic oxygen supplementation - No acute exacerbation appreciated and will continue home bronchodilator management..   Subjective: -Did well with CPAP with oxygen bled in overnight - Complains of fatigue =-Had brown stool  Physical Exam: Vitals:   01/12/24 0500 01/12/24 0935 01/12/24 1335 01/12/24 1800  BP:  (!) 121/49 (!) 112/47 (!) 109/40  Pulse:  71 72 73  Resp:   19   Temp:  98.5 F (36.9 C) 98 F (36.7 C) 98.2 F (36.8 C)  TempSrc:  Oral Oral Oral  SpO2:   100% 100% 100%  Weight: 125.5 kg     Height:        Physical Exam  Gen:- Awake Alert, in no acute distress , obese, HEENT:- [place].AT, No sclera icterus Nose- [place] 3L/min  Neck-Supple Neck,No JVD,.  Lungs-  CTAB , fair air movement bilaterally  CV- S1, S2 normal, RRR Abd-  +ve B.Sounds, Abd Soft, No tenderness,    Extremity/Skin:-  good pedal pulses  Psych-affect is appropriate, oriented x3 Neuro-generalized weakness, no new focal deficits, no tremors MSK-left  leg/knee area hematoma/ecchymosis please see photos in epic  Family Communication: No family at bedside.  Disposition: To be determined Status is: Inpatient    Time spent: 50 minutes  Author: Rendall Carwin, MD 01/12/2024 6:23 PM  For on call review [email or web address].

## 2024-01-12 NOTE — Telephone Encounter (Signed)
Noted, will FYI Dr.McDowell 

## 2024-01-12 NOTE — Progress Notes (Signed)
 Patient's potassium level 5.6 this am,Dr Courage notified. Plan of care on going.

## 2024-01-12 NOTE — Telephone Encounter (Signed)
 Wife called to report patient has been admitted to [facility], 3rd Floor, Rm 305.

## 2024-01-12 NOTE — Care Management Important Message (Signed)
 Important Message  Patient Details 2nd IM delivered verbally. Name: Tommy Riley MRN: [id number] Date of Birth: 1945-10-12   Important Message Given:  Yes - Medicare IM     Melba Ates 01/12/2024, 9:23 AM

## 2024-01-12 NOTE — Progress Notes (Signed)
 Nurse at bedside, blood pressure 109/40,heart rate 73,Dr Courage notified. Patient receiving one unit of Packed red blood cells per MD's orders. Plan of care on going.

## 2024-01-12 NOTE — Plan of Care (Signed)
   Problem: Education: Goal: Knowledge of General Education information will improve Description Including pain rating scale, medication(s)/side effects and non-pharmacologic comfort measures Outcome: Progressing   Problem: Education: Goal: Knowledge of General Education information will improve Description Including pain rating scale, medication(s)/side effects and non-pharmacologic comfort measures Outcome: Progressing

## 2024-01-13 DIAGNOSIS — N179 Acute kidney failure, unspecified: Secondary | ICD-10-CM | POA: Diagnosis not present

## 2024-01-13 LAB — GLUCOSE, CAPILLARY
Glucose-Capillary: 177 mg/dL — ABNORMAL HIGH (ref 70–99)
Glucose-Capillary: 209 mg/dL — ABNORMAL HIGH (ref 70–99)
Glucose-Capillary: 193 mg/dL — ABNORMAL HIGH (ref 70–99)
Glucose-Capillary: 211 mg/dL — ABNORMAL HIGH (ref 70–99)

## 2024-01-13 LAB — RENAL FUNCTION PANEL
Albumin: 2.9 g/dL — ABNORMAL LOW (ref 3.5–5.0)
Anion gap: 11 (ref 5–15)
BUN: 41 mg/dL — ABNORMAL HIGH (ref 8–23)
CO2: 27 mmol/L (ref 22–32)
Calcium: 8.6 mg/dL — ABNORMAL LOW (ref 8.9–10.3)
Chloride: 100 mmol/L (ref 98–111)
Creatinine, Ser: 1.45 mg/dL — ABNORMAL HIGH (ref 0.61–1.24)
GFR, Estimated: 50 mL/min — ABNORMAL LOW (ref >60)
Glucose, Bld: 148 mg/dL — ABNORMAL HIGH (ref 70–99)
Phosphorus: 4 mg/dL (ref 2.5–4.6)
Potassium: 4.7 mmol/L (ref 3.5–5.1)
Sodium: 138 mmol/L (ref 135–145)

## 2024-01-13 LAB — CBC
HCT: 24.4 % — ABNORMAL LOW (ref 39.0–52.0)
Hemoglobin: 7.5 g/dL — ABNORMAL LOW (ref 13.0–17.0)
MCH: 30.6 pg (ref 26.0–34.0)
MCHC: 30.7 g/dL (ref 30.0–36.0)
MCV: 99.6 fL (ref 80.0–100.0)
Platelets: 149 K/uL — ABNORMAL LOW (ref 150–400)
RBC: 2.45 MIL/uL — ABNORMAL LOW (ref 4.22–5.81)
RDW: 17.6 % — ABNORMAL HIGH (ref 11.5–15.5)
WBC: 10.3 K/uL (ref 4.0–10.5)
nRBC: 0.9 % — ABNORMAL HIGH (ref 0.0–0.2)

## 2024-01-13 LAB — MAGNESIUM: Magnesium: 2.7 mg/dL — ABNORMAL HIGH (ref 1.7–2.4)

## 2024-01-13 LAB — PREPARE RBC (CROSSMATCH)

## 2024-01-13 MED ORDER — FLUTICASONE-UMECLIDIN-VILANT 100-62.5-25 MCG/ACT IN AEPB
1.0000 | INHALATION_SPRAY | Freq: Every day | RESPIRATORY_TRACT | Status: DC
  Administered 2024-01-15 – 2024-01-18 (×4): 1 via RESPIRATORY_TRACT
  Filled 2024-01-13 (×2): qty 1

## 2024-01-13 MED ORDER — ALBUTEROL SULFATE (2.5 MG/3ML) 0.083% IN NEBU
2.5000 mg | INHALATION_SOLUTION | Freq: Three times a day (TID) | RESPIRATORY_TRACT | Status: DC
  Administered 2024-01-13 – 2024-01-15 (×5): 2.5 mg via RESPIRATORY_TRACT
  Filled 2024-01-13 (×6): qty 3

## 2024-01-13 MED ORDER — SODIUM CHLORIDE 0.9% IV SOLUTION: Freq: Once | INTRAVENOUS | Status: AC

## 2024-01-13 NOTE — Consult Note (Addendum)
 WOC Nurse Consult Note: patient with large hematoma to L knee/leg from a fall that has been evaluated by orthopedics  Reason for Consult: leg wound  Wound type: full thickness with hematoma r/t fall  Pressure Injury POA: NA  Measurement: see nursing flowsheet  Wound azi:ijmx hemorrhagic, some intact serous filled bullae  Drainage (amount, consistency, odor) see nursing flowsheet  Periwound: edema, ecchymosis  Dressing procedure/placement/frequency: Cleanse L knee/ leg hematoma with soap and water, dry and apply Xeroform Soila #[id number])  gauze to open areas and blisters daily, cover with ABD pad and secure with Kerlix roll gauze to secure.  May apply Ace bandage wrapped in same fashion as Kerlix for light compression.   POC discussed with bedside nurse. WOC team will not follow.  Re-consult if further needs arise.   Thank you,    Powell Bar MSN, RN-BC, [employer]

## 2024-01-13 NOTE — Progress Notes (Signed)
 Physical Therapy Treatment Patient Details Name: Tommy Riley MRN: [id number] DOB: 07-13-1945 Today's Date: 01/13/2024   History of Present Illness Tommy Riley is a 78 y.o. male with medical history significant of history of atrial flutter/atrial fibrillation on chronic anticoagulation, chronic kidney disease stage II, hypertension, type 2 diabetes mellitus with nephropathy, chronic respiratory failure with hypoxia due to COPD, hyperlipidemia, lymphedema and history of class II obesity; who presented to the hospital after mechanical fall.  Patient reports no chest pain, no shortness of breath, no nausea, no vomiting, no prodrome make symptoms and essentially just tripping causing his fall.  Significant bruise appreciated on his knees; patient expressed no hitting his head.     Workup significant for positive decrease in patient's hemoglobin into the 8.2 range (chronically around 10-13); elevated MCV at 100.4 and comprehensive metabolic panel demonstrating acute kidney injury creatinine of 1.54 and potassium 5.2.  Fecal occult blood test negative and patient reported no dysuria, hematuria, melena or hematemesis.     GI service consulted and TRH contacted to place patient in the hospital for further evaluation and management.    PT Comments  Patient present with severe swelling and hematoma to left knee and agreeable for therapy. Patient demonstrates slow labored movement for sitting up at bedside requiring assistance for moving LLE, very unsteady on feet and limited to take a few steps forward/backwards at bedside before requesting to sit due to fatigue and increasing left knee pain. Patient tolerated sitting up in chair after therapy with his spouse present. Patient will benefit from continued skilled physical therapy in hospital and recommended venue below to increase strength, balance, endurance for safe ADLs and gait.       If plan is discharge home, recommend the following: A lot of help  with bathing/dressing/bathroom;A lot of help with walking and/or transfers;Help with stairs or ramp for entrance;Assistance with cooking/housework   Can travel by private vehicle     No  Equipment Recommendations  None recommended by PT    Recommendations for Other Services       Precautions / Restrictions Precautions Precautions: Fall Recall of Precautions/Restrictions: Intact Restrictions Weight Bearing Restrictions Per Provider Order: No     Mobility  Bed Mobility Overal bed mobility: Needs Assistance Bed Mobility: Supine to Sit     Supine to sit: Min assist, Mod assist     General bed mobility comments: had difficulty moving LLE due to increasing knee pain    Transfers Overall transfer level: Needs assistance Equipment used: Rolling walker (2 wheels) Transfers: Sit to/from Stand, Bed to chair/wheelchair/BSC Sit to Stand: Min assist   Step pivot transfers: Min assist       General transfer comment: unsteady labored movement    Ambulation/Gait Ambulation/Gait assistance: Min assist, Mod assist Gait Distance (Feet): 10 Feet Assistive device: Rolling walker (2 wheels) Gait Pattern/deviations: Decreased step length - right, Decreased step length - left, Decreased stride length, Decreased stance time - left, Trunk flexed, Antalgic Gait velocity: slow     General Gait Details: limited to a few steps forward/backwards in room due to c/o fatigue, difficulty breathing and increasing left knee pain   Stairs             Wheelchair Mobility     Tilt Bed    Modified Rankin (Stroke Patients Only)       Balance Overall balance assessment: Needs assistance Sitting-balance support: Feet supported, No upper extremity supported Sitting balance-Leahy Scale: Good     Standing balance  support: Reliant on assistive device for balance, During functional activity, Bilateral upper extremity supported Standing balance-Leahy Scale: Poor Standing balance  comment: fair/poor using RW                            Communication Communication Communication: No apparent difficulties  Cognition Arousal: Alert Behavior During Therapy: WFL for tasks assessed/performed   PT - Cognitive impairments: No apparent impairments                         Following commands: Intact      Cueing Cueing Techniques: Verbal cues  Exercises      General Comments        Pertinent Vitals/Pain Pain Assessment Pain Assessment: Faces Faces Pain Scale: Hurts little more Pain Location: left knee wound Pain Descriptors / Indicators: Discomfort, Sore Pain Intervention(s): Limited activity within patient's tolerance, Monitored during session, Repositioned    Home Living                          Prior Function            PT Goals (current goals can now be found in the care plan section) Acute Rehab PT Goals Patient Stated Goal: return home with family to assist PT Goal Formulation: With patient Time For Goal Achievement: 01/26/24 Potential to Achieve Goals: Good Progress towards PT goals: Progressing toward goals    Frequency    Min 3X/week      PT Plan      Co-evaluation              AM-PAC PT 6 Clicks Mobility   Outcome Measure  Help needed turning from your back to your side while in a flat bed without using bedrails?: A Little Help needed moving from lying on your back to sitting on the side of a flat bed without using bedrails?: A Little Help needed moving to and from a bed to a chair (including a wheelchair)?: A Little Help needed standing up from a chair using your arms (e.g., wheelchair or bedside chair)?: A Little Help needed to walk in hospital room?: A Lot Help needed climbing 3-5 steps with a railing? : A Lot 6 Click Score: 16    End of Session Equipment Utilized During Treatment: Oxygen Activity Tolerance: Patient tolerated treatment well;Patient limited by fatigue Patient left: in  chair;with call bell/phone within reach Nurse Communication: Mobility status PT Visit Diagnosis: Unsteadiness on feet (R26.81);Other abnormalities of gait and mobility (R26.89);Muscle weakness (generalized) (M62.81)     Time: 8887-8858 PT Time Calculation (min) (ACUTE ONLY): 29 min  Charges:    $Therapeutic Activity: 23-37 mins PT General Charges $$ ACUTE PT VISIT: 1 Visit                     2:22 PM, 01/13/24 Lynwood Music, MPT Physical Therapist with [facility] [phone] office [phone] mobile phone

## 2024-01-13 NOTE — Progress Notes (Signed)
 Nurse at bedside,patient receiving one unit of packed red blood cells per Dr Rendall orders,patient tolerating transfusion this far.Plan of care on going.

## 2024-01-13 NOTE — Progress Notes (Signed)
 Nurse at bedside,patient alert and oriented to person,place,and situation,confused to time.Patient's blood pressure was 96/82,heart rate 70,Dr Courage notified,holding metoprolol  this am. Patient sitting up on side of bed. Plan of care on going.

## 2024-01-13 NOTE — TOC Progression Note (Signed)
 Transition of Care [facility]) - Progression Note    Patient Details  Name: Tommy Riley MRN: [id number] Date of Birth: May 17, 1946  Transition of Care [facility] CM/SW Contact  [place]arthur Saddie Kim, [place] Phone Number: 01/13/2024, 11:57 AM  Clinical Narrative: PT feels pt would benefit from SNF. LCSW discussed with pt who states he plans to talk to his family and wants to see how he progresses over the next day or two. TOC will follow up.       Expected Discharge Plan: Home w Home Health Services Barriers to Discharge: Continued Medical Work up  Expected Discharge Plan and Services In-house Referral: Clinical Social Work Discharge Planning Services: CM Consult   Living arrangements for the past 2 months: Single Family Home                           HH Arranged: PT HH Agency: [facility] Date [facility] Agency Contacted: 01/10/24 Time HH Agency Contacted: 1340 Representative spoke with at [facility] Agency: [name]   Social Determinants of Health (SDOH) Interventions SDOH Screenings   Food Insecurity: No Food Insecurity (01/09/2024)  Housing: Low Risk  (01/09/2024)  Transportation Needs: No Transportation Needs (01/09/2024)  Utilities: Not At Risk (01/09/2024)  Alcohol Screen: Low Risk  (05/01/2021)  Financial Resource Strain: Low Risk  (05/01/2021)  Social Connections: Unknown (01/09/2024)  Tobacco Use: High Risk (01/09/2024)    Readmission Risk Interventions     No data to display

## 2024-01-13 NOTE — Progress Notes (Signed)
 Progress Note   Patient: Tommy Riley:[id number] DOB: [birth year] DOA: 01/09/2024     4 DOS: the patient was seen and examined on 01/13/2024   Brief hospital admission narrative: Tommy Riley is a 78 y.o. male with medical history significant of history of atrial flutter/atrial fibrillation on chronic anticoagulation, chronic kidney disease stage II, hypertension, type 2 diabetes mellitus with nephropathy, chronic respiratory failure with hypoxia due to COPD, hyperlipidemia, lymphedema and history of class II obesity; who presented to the hospital after mechanical fall.  Patient reports no chest pain, no shortness of breath, no nausea, no vomiting, no prodrome make symptoms and essentially just tripping causing his fall.  Significant bruise appreciated on his knees; patient expressed no hitting his head.   Workup significant for positive decrease in patient's hemoglobin into the 8.2 range (chronically around 10-13); elevated MCV at 100.4 and comprehensive metabolic panel demonstrating acute kidney injury creatinine of 1.54 and potassium 5.2.  Fecal occult blood test negative and patient reported no dysuria, hematuria, melena or hematemesis.   GI service consulted and TRH contacted to place patient in the hospital for further evaluation and management.  Assessment and plan 1-mechanical fall - Continue as needed analgesics and follow following physical therapy evaluation -physical therapy eval appreciated recommends SNF rehab  2-anemia in a patient on chronic anticoagulation - No overt bleeding appreciated  Goal is for hemoglobin above 8 as much as possible. - Patient high risk for endoscopic evaluation and in the absence of overt bleeding and negative fecal occult blood test we will continue to follow trend - Continue holding anticoagulations - High concerns for significant left leg hematoma after mechanical fall; CT scan confirming very extensive hematoma affecting left lower leg.   Case discussed with orthopedic service who recommended no surgical intervention at the moment. - Continue supportive care and follow clinical response. -Hgb 7.5 so - Transfuse additional unit of PRBC on 01/12/2022--- for total 3 units of PRBC this admission  3-history of paroxysmal atrial fibrillation - Continue metoprolol  for rate control - Continue holding Eliquis  - Continue telemetry monitoring and follow electrolytes trend.  4-type 2 diabetes mellitus with nephropathy - Chronic kidney disease stage II at baseline - Continue to maintain adequate hydration, follow renal function trend and continue sliding scale insulin  and Semglee   5-hypertension - Continue metoprolol  - Holding other antihypertensive agents and diuretics in the setting of acute kidney injury  6-chronic diastolic heart failure - Continue daily weights, strict I's and O's and low-sodium diet - Holding Demadex , losartan  and spironolactone  in the setting of acute kidney injury.  7-acute kidney injury on chronic kidney disease - Stage II at baseline - Continue to minimize nephrotoxic agent - Follow renal function trend.  8-class II obesity - Follow low-calorie diet and portion control -Body mass index is 39.17 kg/m.   9-history of gout - Acute exacerbation: Appreciate - Continue treatment with allopurinol .  10-chronic respiratory failure with hypoxia/COPD/OSA - Continue CPAP nightly - Continue chronic oxygen supplementation - No acute exacerbation appreciated and will continue home bronchodilator management..   Subjective: LPN---Val at bedside No chest pains and no palpitations - Sitting on edge of the bed - Reevaluated by physical therapist recommends SNF rehab  Physical Exam: Vitals:   01/13/24 1300 01/13/24 1445 01/13/24 1522 01/13/24 1759  BP: (!) 115/50 (!) 118/40 (!) 113/50 (!) 138/110  Pulse: 70 70 74 69  Resp: 18  19 20   Temp: 98 F (36.7 C) 98.4 F (36.9 C) 98 F (36.7 C)  98.2 F (36.8 C)   TempSrc: Oral Oral Oral Oral  SpO2: 98%  91% 91%  Weight:      Height:        Physical Exam  Gen:- Awake Alert, in no acute distress , obese, HEENT:- [place].AT, No sclera icterus Nose- [place] 3L/min  Neck-Supple Neck,No JVD,.  Lungs-  CTAB , fair air movement bilaterally  CV- S1, S2 normal, RRR Abd-  +ve B.Sounds, Abd Soft, No tenderness,    Extremity/Skin:-  good pedal pulses  Psych-affect is appropriate, oriented x3 Neuro-generalized weakness, no new focal deficits, no tremors MSK-left  leg/knee area hematoma/ecchymosis please see photos in epic  Family Communication: No family at bedside.  Disposition: SNF Status is: Inpatient   Author: Rendall Carwin, MD 01/13/2024 7:39 PM  For on call review [email or web address].

## 2024-01-14 ENCOUNTER — Encounter: Admitting: Cardiology

## 2024-01-14 DIAGNOSIS — N179 Acute kidney failure, unspecified: Secondary | ICD-10-CM | POA: Diagnosis not present

## 2024-01-14 LAB — BPAM RBC
Blood Product Expiration Date: 202507232359
Blood Product Expiration Date: 202508072359
Blood Product Expiration Date: 202508072359
Blood Product Expiration Date: 202508072359
ISSUE DATE / TIME: 202507071435
ISSUE DATE / TIME: 202507081442
ISSUE DATE / TIME: 202507091804
ISSUE DATE / TIME: 202507101457
Unit Type and Rh: 5100
Unit Type and Rh: 5100
Unit Type and Rh: 5100
Unit Type and Rh: 5100

## 2024-01-14 LAB — TYPE AND SCREEN
ABO/RH(D): O POS
Antibody Screen: NEGATIVE
Unit division: 0
Unit division: 0
Unit division: 0
Unit division: 0

## 2024-01-14 LAB — CBC
HCT: 24.4 % — ABNORMAL LOW (ref 39.0–52.0)
Hemoglobin: 7.9 g/dL — ABNORMAL LOW (ref 13.0–17.0)
MCH: 31.5 pg (ref 26.0–34.0)
MCHC: 32.4 g/dL (ref 30.0–36.0)
MCV: 97.2 fL (ref 80.0–100.0)
Platelets: 170 K/uL (ref 150–400)
RBC: 2.51 MIL/uL — ABNORMAL LOW (ref 4.22–5.81)
RDW: 16.6 % — ABNORMAL HIGH (ref 11.5–15.5)
WBC: 10.5 K/uL (ref 4.0–10.5)
nRBC: 1.1 % — ABNORMAL HIGH (ref 0.0–0.2)

## 2024-01-14 LAB — GLUCOSE, CAPILLARY
Glucose-Capillary: 197 mg/dL — ABNORMAL HIGH (ref 70–99)
Glucose-Capillary: 204 mg/dL — ABNORMAL HIGH (ref 70–99)
Glucose-Capillary: 212 mg/dL — ABNORMAL HIGH (ref 70–99)
Glucose-Capillary: 250 mg/dL — ABNORMAL HIGH (ref 70–99)

## 2024-01-14 LAB — RENAL FUNCTION PANEL
Albumin: 2.8 g/dL — ABNORMAL LOW (ref 3.5–5.0)
Anion gap: 15 (ref 5–15)
BUN: 46 mg/dL — ABNORMAL HIGH (ref 8–23)
CO2: 26 mmol/L (ref 22–32)
Calcium: 8.7 mg/dL — ABNORMAL LOW (ref 8.9–10.3)
Chloride: 95 mmol/L — ABNORMAL LOW (ref 98–111)
Creatinine, Ser: 1.62 mg/dL — ABNORMAL HIGH (ref 0.61–1.24)
GFR, Estimated: 44 mL/min — ABNORMAL LOW (ref >60)
Glucose, Bld: 238 mg/dL — ABNORMAL HIGH (ref 70–99)
Phosphorus: 4.1 mg/dL (ref 2.5–4.6)
Potassium: 4.2 mmol/L (ref 3.5–5.1)
Sodium: 136 mmol/L (ref 135–145)

## 2024-01-14 NOTE — Progress Notes (Signed)
 Approximately 1315 patient used his home Trelegy DPI

## 2024-01-14 NOTE — Plan of Care (Signed)
  Problem: Acute Rehab OT Goals (only OT should resolve) Goal: Pt. Will Perform Grooming Flowsheets (Taken 01/14/2024 1406) Pt Will Perform Grooming:  with contact guard assist  standing Goal: Pt. Will Perform Lower Body Bathing Flowsheets (Taken 01/14/2024 1406) Pt Will Perform Lower Body Bathing:  with contact guard assist  with adaptive equipment  sitting/lateral leans Goal: Pt. Will Perform Lower Body Dressing Flowsheets (Taken 01/14/2024 1406) Pt Will Perform Lower Body Dressing:  with contact guard assist  sitting/lateral leans  with adaptive equipment Goal: Pt. Will Transfer To Toilet Flowsheets (Taken 01/14/2024 1406) Pt Will Transfer to Toilet:  with supervision  stand pivot transfer  ambulating Goal: Pt. Will Perform Toileting-Clothing Manipulation Flowsheets (Taken 01/14/2024 1406) Pt Will Perform Toileting - Clothing Manipulation and hygiene:  sitting/lateral leans  with supervision  Darian Ace OT, MOT

## 2024-01-14 NOTE — Progress Notes (Signed)
 Progress Note   Patient: Tommy Riley FMW:[id number] DOB: [birth year] DOA: 01/09/2024     5 DOS: the patient was seen and examined on 01/14/2024   Brief hospital admission narrative: Tommy Riley is a 77 y.o. male with medical history significant of history of atrial flutter/atrial fibrillation on chronic anticoagulation, chronic kidney disease stage II, hypertension, type 2 diabetes mellitus with nephropathy, chronic respiratory failure with hypoxia due to COPD, hyperlipidemia, lymphedema and history of class II obesity; who presented to the hospital after mechanical fall.  Patient reports no chest pain, no shortness of breath, no nausea, no vomiting, no prodrome make symptoms and essentially just tripping causing his fall.  Significant bruise appreciated on his knees; patient expressed no hitting his head.   Workup significant for positive decrease in patient's hemoglobin into the 8.2 range (chronically around 10-13); elevated MCV at 100.4 and comprehensive metabolic panel demonstrating acute kidney injury creatinine of 1.54 and potassium 5.2.  Fecal occult blood test negative and patient reported no dysuria, hematuria, melena or hematemesis.   GI service consulted and TRH contacted to place patient in the hospital for further evaluation and management.  Assessment and plan 1-mechanical fall - Continue as needed analgesics and follow following physical therapy evaluation -physical therapy eval appreciated recommends SNF rehab  2-anemia in a patient on chronic anticoagulation - No overt bleeding appreciated  Goal is for hemoglobin above 8 as much as possible. - Patient high risk for endoscopic evaluation and in the absence of overt bleeding and negative fecal occult blood test we will continue to follow trend - Continue holding anticoagulations - High concerns for significant left leg hematoma after mechanical fall; CT scan confirming very extensive hematoma affecting left lower leg.   Case discussed with orthopedic service who recommended no surgical intervention at the moment. - Continue supportive care and follow clinical response. - - Transfused additional unit of PRBC on 01/12/2022--- for total 3 units of PRBC this admission - Hgb up to 7.9--hold off on further transfusions as negative hemoglobin is close to 8-  3-history of paroxysmal atrial fibrillation - Continue metoprolol  for rate control - Continue holding Eliquis  - Continue telemetry monitoring and follow electrolytes trend.  4-type 2 diabetes mellitus with nephropathy - Chronic kidney disease stage II at baseline - Continue to maintain adequate hydration, follow renal function trend and continue sliding scale insulin  and Semglee   5-hypertension - Continue metoprolol  - Holding other antihypertensive agents and diuretics in the setting of acute kidney injury  6-chronic diastolic heart failure - Continue daily weights, strict I's and O's and low-sodium diet - Holding Demadex , losartan  and spironolactone  in the setting of acute kidney injury.  7-acute kidney injury on chronic kidney disease - Stage II at baseline - Continue to minimize nephrotoxic agent - Follow renal function trend.  8-class II obesity - Follow low-calorie diet and portion control -Body mass index is 39.27 kg/m.   9-history of gout - Acute exacerbation: Appreciate - Continue treatment with allopurinol .  10-chronic respiratory failure with hypoxia/COPD/OSA - Continue CPAP nightly - Continue chronic oxygen supplementation - No acute exacerbation appreciated and will continue home bronchodilator management.SABRA  11) generalized weakness and deconditioning--- physical therapy evaluation appreciated recommends SNF rehab -- Patient and wife agreeable to SNF rehab  Subjective: -Wife at bedside, questions answered - Patient complains of generalized weakness and deconditioning  Physical Exam: Vitals:   01/14/24 0411 01/14/24 0741  01/14/24 1319 01/14/24 1350  BP:    (!) 124/58  Pulse:  70  Resp:    18  Temp:    97.8 F (36.6 C)  TempSrc:    Oral  SpO2:  98% 97% 100%  Weight: 127.7 kg     Height:        Physical Exam  Gen:- Awake Alert, in no acute distress , obese, HEENT:- [place].AT, No sclera icterus Nose-  3L/min  Neck-Supple Neck,No JVD,.  Lungs-  CTAB , fair air movement bilaterally  CV- S1, S2 normal, RRR Abd-  +ve B.Sounds, Abd Soft, No tenderness,    Extremity/Skin:-  good pedal pulses  Psych-affect is appropriate, oriented x3 Neuro-generalized weakness, no new focal deficits, no tremors MSK-left  leg/knee area hematoma/ecchymosis please see photos in epic  Family Communication: Discussed with wife at bedside.  Disposition: SNF Status is: Inpatient  Author: Rendall Carwin, MD 01/14/2024 7:24 PM  For on call review [email or web address].

## 2024-01-14 NOTE — Evaluation (Signed)
 Occupational Therapy Evaluation Patient Details Name: Tommy Riley MRN: [id number] DOB: 1946-01-28 Today's Date: 01/14/2024   History of Present Illness   Tommy Riley is a 78 y.o. male with medical history significant of history of atrial flutter/atrial fibrillation on chronic anticoagulation, chronic kidney disease stage II, hypertension, type 2 diabetes mellitus with nephropathy, chronic respiratory failure with hypoxia due to COPD, hyperlipidemia, lymphedema and history of class II obesity; who presented to the hospital after mechanical fall.  Patient reports no chest pain, no shortness of breath, no nausea, no vomiting, no prodrome make symptoms and essentially just tripping causing his fall.  Significant bruise appreciated on his knees; patient expressed no hitting his head. (per MD)     Clinical Impressions Pt agreeable to OT evaluation. Pt reports using cane PRN at baseline but today required use of the RW with min to mod A for step pivot transfer to and from the chair. More mod A needed to boost from EOB. Pt unable to complete lower body ADL tasks with at least mod A given L knee hematoma and pain. Pt required mod A for bed mobility today. Wife is home 24/7 but has her own physical issues that limit the amount of physical help she could be at home. Pt has a history of fall and would be a high fall risk at this time at home. Pt was left in the bed with call bell within reach and family present. Pt will benefit from continued OT in the hospital and recommended venue below to increase strength, balance, and endurance for safe ADL's.        If plan is discharge home, recommend the following:   A lot of help with walking and/or transfers;A lot of help with bathing/dressing/bathroom;Assistance with feeding;Assist for transportation;Help with stairs or ramp for entrance;Assistance with cooking/housework     Functional Status Assessment   Patient has had a recent decline in their  functional status and demonstrates the ability to make significant improvements in function in a reasonable and predictable amount of time.     Equipment Recommendations   None recommended by OT             Precautions/Restrictions   Precautions Precautions: Fall Recall of Precautions/Restrictions: Intact Precaution/Restrictions Comments: L knee hematoma Restrictions Weight Bearing Restrictions Per Provider Order: No     Mobility Bed Mobility Overal bed mobility: Needs Assistance Bed Mobility: Supine to Sit, Sit to Supine     Supine to sit: HOB elevated, Mod assist Sit to supine: Mod assist, HOB elevated   General bed mobility comments: L LE required support from this therapist for bed mobility.    Transfers Overall transfer level: Needs assistance Equipment used: Rolling walker (2 wheels) Transfers: Sit to/from Stand, Bed to chair/wheelchair/BSC Sit to Stand: Mod assist     Step pivot transfers: Min assist, Mod assist     General transfer comment: Unsteady labored movement; cuing needed to lower into chair rather than plopping as he did from bed to chair in frist transfer attempt.      Balance Overall balance assessment: Needs assistance Sitting-balance support: Feet supported, No upper extremity supported Sitting balance-Leahy Scale: Good Sitting balance - Comments: seated at EOB   Standing balance support: Reliant on assistive device for balance, During functional activity, Bilateral upper extremity supported Standing balance-Leahy Scale: Poor Standing balance comment: fair/poor using RW  ADL either performed or assessed with clinical judgement   ADL Overall ADL's : Needs assistance/impaired     Grooming: Set up;Sitting       Lower Body Bathing: Moderate assistance;Maximal assistance;Sitting/lateral leans       Lower Body Dressing: Moderate assistance;Maximal assistance;Sitting/lateral leans   Toilet  Transfer: Moderate assistance;Rolling walker (2 wheels);Stand-pivot;Minimal assistance Toilet Transfer Details (indicate cue type and reason): Simulated via EOB to chair and back with RW. Toileting- Clothing Manipulation and Hygiene: Moderate assistance;Maximal assistance;Sitting/lateral lean       Functional mobility during ADLs: Moderate assistance;Rolling walker (2 wheels)       Vision Baseline Vision/History: 1 Wears glasses Vision Assessment?: No apparent visual deficits (during functional tasks today)     Perception Perception: Not tested       Praxis Praxis: Not tested       Pertinent Vitals/Pain Pain Assessment Pain Assessment: 0-10 Pain Score: 8  Pain Location: L knee wound Pain Descriptors / Indicators: Guarding, Sore Pain Intervention(s): Monitored during session, Repositioned, Limited activity within patient's tolerance     Extremity/Trunk Assessment Upper Extremity Assessment Upper Extremity Assessment: Overall WFL for tasks assessed   Lower Extremity Assessment Lower Extremity Assessment: Defer to PT evaluation   Cervical / Trunk Assessment Cervical / Trunk Assessment: Kyphotic   Communication Communication Communication: No apparent difficulties   Cognition Arousal: Alert Behavior During Therapy: WFL for tasks assessed/performed Cognition: No apparent impairments                               Following commands: Intact       Cueing  General Comments   Cueing Techniques: Verbal cues                 Home Living Family/patient expects to be discharged to:: Private residence Living Arrangements: Spouse/significant other Available Help at Discharge: Family;Available 24 hours/day Type of Home: House Home Access: Stairs to enter [employer] of Steps: 2 Entrance Stairs-Rails: Right;Left Home Layout: One level     Bathroom Shower/Tub: Chief Strategy Officer: Standard Bathroom Accessibility: No    Home Equipment: Tub bench;Grab bars - tub/shower;Rolling Walker (2 wheels);Cane - single point;Rollator (4 wheels)          Prior Functioning/Environment Prior Level of Function : Independent/Modified Independent;Driving;History of Falls (last six months)             Mobility Comments: household and short distanced community ambulation using cane PRN. ADLs Comments: Independent ADL's.    OT Problem List: Decreased strength;Decreased activity tolerance;Impaired balance (sitting and/or standing);Obesity;Pain;Increased edema   OT Treatment/Interventions: Self-care/ADL training;Therapeutic exercise;Therapeutic activities;Patient/family education;Balance training;DME and/or AE instruction      OT Goals(Current goals can be found in the care plan section)   Acute Rehab OT Goals Patient Stated Goal: improve function OT Goal Formulation: With patient/family Time For Goal Achievement: 01/28/24 Potential to Achieve Goals: Good   OT Frequency:  Min 2X/week                                   End of Session Equipment Utilized During Treatment: Rolling walker (2 wheels);Gait belt  Activity Tolerance: Patient tolerated treatment well Patient left: in bed;with call bell/phone within reach;with family/visitor present  OT Visit Diagnosis: Unsteadiness on feet (R26.81);Other abnormalities of gait and mobility (R26.89);History of falling (Z91.81);Muscle weakness (generalized) (M62.81);Pain Pain - Right/Left: Left Pain -  part of body: Knee                Time: 8671-8653 OT Time Calculation (min): 18 min Charges:  OT General Charges $OT Visit: 1 Visit OT Evaluation $OT Eval Low Complexity: 1 Low  Kalan Yeley OT, MOT  Jayson Person 01/14/2024, 2:02 PM

## 2024-01-14 NOTE — Inpatient Diabetes Management (Addendum)
 Inpatient Diabetes Program Recommendations  AACE/ADA: New Consensus Statement on Inpatient Glycemic Control   Target Ranges:  Prepandial:   less than 140 mg/dL      Peak postprandial:   less than 180 mg/dL (1-2 hours)      Critically ill patients:  140 - 180 mg/dL    Latest Reference Range & Units 01/13/24 07:24 01/13/24 11:20 01/13/24 16:41 01/13/24 22:06 01/14/24 07:49  Glucose-Capillary 70 - 99 mg/dL 822 (H) 790 (H) 806 (H) 211 (H) 197 (H)   Review of Glycemic Control  Diabetes history: DM2 Outpatient Diabetes medications: Tresiba 50 units BID, Novolog  2-20 units BID, Farxiga  10 mg daily, Ozempic 1 mg Qweek, Dexcom G7 Current orders for Inpatient glycemic control: Semglee  5 units at bedtime, Farxiga  10 mg daily, Novolog  0-9 units TID with melas, Novolog  0-5 units QHS  Inpatient Diabetes Program Recommendations:    Insulin : Please consider ordering Novolog  3 units TID with meals for meal coverage if patient eats at least 50% of meals.  Thanks, Earnie Gainer, RN, MSN, CDCES Diabetes Coordinator Inpatient Diabetes Program [phone] (Team Pager from 8am to 5pm)

## 2024-01-14 NOTE — Consult Note (Signed)
 WOC Nurse Consult Note: Reason for Consult: re-consulted for knee wound Patient sustained fall and has severe bruising on his left flank; sacral region, buttocks and abdomen. Left knee initially with bruising and hematoma along with some intact serous blisters.  The wound has evolved and some of the blisters have ruptured.  Wound type: trauma Pressure Injury POA: NA Measurement: see nursing flow sheet Wound azi:ijmx purple wound bed consistent with hematoma, partial thickness blister ruptured, small serous filled blisters along the distal portion of the larger knee wound.  Drainage (amount, consistency, odor) see nursing flow sheet Periwound:edema; appears redness that was previous marked has imrpoved.  Dressing procedure/placement/frequency: Would continue with xeroform as the most atraumatic dressing on formulary and light compression if tolerated to encourage reabsorption of the hematoma.  Cleanse L knee/ leg hematoma with soap and water, dry and apply Xeroform Soila [phone]) gauze  to open areas and blisters daily, cover with ABD pad and secure with Kerlix roll gauze.  May apply Ace bandage wrapped in same fashion as Kerlix for light compression.   Discussed POC with patient and bedside nurse.  Re consult if needed, will not follow at this time. Thanks  Meghen Akopyan [employer], RN,CWOCN, CNS, CWON-AP [phone])

## 2024-01-14 NOTE — Progress Notes (Signed)
 RN to bedside to perform am dressing change to L knee. Pt states the provider told him not to wrap his knee and to leave it open. Noted xeroform and mepilex to L knee at this time. Pt prefers to wait for am provider to round and look at his knee before any dressings or intervention. Dressing change deferred at this time.

## 2024-01-15 DIAGNOSIS — N179 Acute kidney failure, unspecified: Secondary | ICD-10-CM | POA: Diagnosis not present

## 2024-01-15 LAB — HEMOGLOBIN AND HEMATOCRIT, BLOOD
HCT: 23.6 % — ABNORMAL LOW (ref 39.0–52.0)
Hemoglobin: 7.2 g/dL — ABNORMAL LOW (ref 13.0–17.0)

## 2024-01-15 LAB — GLUCOSE, CAPILLARY
Glucose-Capillary: 192 mg/dL — ABNORMAL HIGH (ref 70–99)
Glucose-Capillary: 193 mg/dL — ABNORMAL HIGH (ref 70–99)
Glucose-Capillary: 234 mg/dL — ABNORMAL HIGH (ref 70–99)
Glucose-Capillary: 238 mg/dL — ABNORMAL HIGH (ref 70–99)

## 2024-01-15 LAB — PREPARE RBC (CROSSMATCH)

## 2024-01-15 MED ORDER — FUROSEMIDE 10 MG/ML IJ SOLN
40.0000 mg | Freq: Once | INTRAMUSCULAR | Status: AC
  Administered 2024-01-15: 40 mg via INTRAVENOUS
  Filled 2024-01-15: qty 4

## 2024-01-15 MED ORDER — ALBUTEROL SULFATE (2.5 MG/3ML) 0.083% IN NEBU
2.5000 mg | INHALATION_SOLUTION | Freq: Two times a day (BID) | RESPIRATORY_TRACT | Status: DC
  Administered 2024-01-16 – 2024-01-17 (×3): 2.5 mg via RESPIRATORY_TRACT
  Filled 2024-01-15 (×4): qty 3

## 2024-01-15 MED ORDER — TORSEMIDE 20 MG PO TABS
80.0000 mg | ORAL_TABLET | Freq: Every day | ORAL | Status: DC
  Administered 2024-01-16 – 2024-01-18 (×3): 80 mg via ORAL
  Filled 2024-01-15 (×3): qty 4

## 2024-01-15 MED ORDER — SODIUM CHLORIDE 0.9% IV SOLUTION: Freq: Once | INTRAVENOUS | Status: DC

## 2024-01-15 NOTE — Progress Notes (Signed)
 PT Cancellation Note  Patient Details Name: Tommy Riley MRN: [id number] DOB: Nov 16, 1945   Cancelled Treatment:      Pt hesitant for mobility this session as he is extreme pain in LLE, its killing me. Pt reporting that his knee was hit yesterday by tray. Weeping occurring while with patient. Pt  was educated on returning tomorrow to continue promote mobility and progress ambulation.     [name] 01/15/2024, 11:49 AM

## 2024-01-15 NOTE — Plan of Care (Signed)
   Problem: Education: Goal: Knowledge of General Education information will improve Description Including pain rating scale, medication(s)/side effects and non-pharmacologic comfort measures Outcome: Progressing

## 2024-01-15 NOTE — Progress Notes (Signed)
 PT Note  Patient Details Name: QUANAH MAJKA MRN: [id number] DOB: [birth year]   New PT order to be completed, pt is already been evaluated, with pertinent DC recommendations in EMR and is being followed and treated. PT to sign off on new order.   [name] PT, DPT [facility]- [facility] [phone] office  [name] 01/15/2024, 8:33 AM

## 2024-01-15 NOTE — Progress Notes (Signed)
 SABRA

## 2024-01-15 NOTE — Progress Notes (Signed)
 Progress Note   Patient: Tommy Riley FMW:[id number] DOB: Jul 29, 1945 DOA: 01/09/2024     6 DOS: the patient was seen and examined on 01/15/2024   Brief hospital admission narrative: Tommy Riley is a 78 y.o. male with medical history significant of history of atrial flutter/atrial fibrillation on chronic anticoagulation, chronic kidney disease stage II, hypertension, type 2 diabetes mellitus with nephropathy, chronic respiratory failure with hypoxia due to COPD, hyperlipidemia, lymphedema and history of class II obesity; who presented to the hospital after mechanical fall.  Patient reports no chest pain, no shortness of breath, no nausea, no vomiting, no prodrome make symptoms and essentially just tripping causing his fall.  Significant bruise appreciated on his knees; patient expressed no hitting his head.   Workup significant for positive decrease in patient's hemoglobin into the 8.2 range (chronically around 10-13); elevated MCV at 100.4 and comprehensive metabolic panel demonstrating acute kidney injury creatinine of 1.54 and potassium 5.2.  Fecal occult blood test negative and patient reported no dysuria, hematuria, melena or hematemesis.   GI service consulted and TRH contacted to place patient in the hospital for further evaluation and management.  Assessment and plan 1-mechanical fall - Continue as needed analgesics and follow following physical therapy evaluation -physical therapy eval appreciated recommends SNF rehab  2-anemia in a patient on chronic anticoagulation - No overt bleeding appreciated  Goal is for hemoglobin above 8 as much as possible. - Patient high risk for endoscopic evaluation and in the absence of overt bleeding and negative fecal occult blood test we will continue to follow trend - Continue holding anticoagulations - High concerns for significant left leg hematoma after mechanical fall; CT scan confirming very extensive hematoma affecting left lower leg.   Case discussed with orthopedic service who recommended no surgical intervention at the moment. - Continue supportive care and follow clinical response. -Hemoglobin is down to 7.2 - - Transfuse additional unit of PRBC on 01/14/2022--- for total 4 units of PRBC this admission   3-history of paroxysmal atrial fibrillation - Continue metoprolol  for rate control - Continue holding Eliquis  - Continue telemetry monitoring and follow electrolytes trend.  4-type 2 diabetes mellitus with nephropathy - Chronic kidney disease stage II at baseline - Continue to maintain adequate hydration, follow renal function trend and continue sliding scale insulin  and Semglee   5-hypertension - Continue metoprolol  - Holding other antihypertensive agents and diuretics in the setting of acute kidney injury  6-chronic diastolic heart failure - Continue daily weights, strict I's and O's and low-sodium diet - Holding Demadex , losartan  and spironolactone  in the setting of acute kidney injury.  7-acute kidney injury on chronic kidney disease - Stage II at baseline - Continue to minimize nephrotoxic agent - Follow renal function trend.  8-class II obesity - Follow low-calorie diet and portion control -Body mass index is 39.27 kg/m.   9-history of gout - Acute exacerbation: Appreciate - Continue treatment with allopurinol .  10-chronic respiratory failure with hypoxia/COPD/OSA - Continue CPAP nightly - Continue chronic oxygen supplementation - No acute exacerbation appreciated and will continue home bronchodilator management.Tommy Riley  11) generalized weakness and deconditioning--- physical therapy evaluation appreciated recommends SNF rehab -- Patient and wife agreeable to SNF rehab  Subjective: - Sitting up in the chair No fever  Or chills  -c/o  of left knee pain after the blister popped  Physical Exam: Vitals:   01/15/24 1635 01/15/24 1704 01/15/24 1944 01/15/24 1951  BP: (!) 116/52 (!) 130/58 122/62    Pulse: 70 69 70  Resp: 20 18 20    Temp: 97.9 F (36.6 C) 97.6 F (36.4 C) 97.9 F (36.6 C)   TempSrc: Oral Oral Oral   SpO2: 100% 98% 100% 99%  Weight:      Height:       Physical Exam Gen:- Awake Alert, in no acute distress , obese, HEENT:- [place].AT, No sclera icterus Nose- [employer] 3L/min  Neck-Supple Neck,No JVD,.  Lungs-  CTAB , fair air movement bilaterally  CV- S1, S2 normal, RRR Abd-  +ve B.Sounds, Abd Soft, No tenderness,    Extremity/Skin:-  good pedal pulses  Psych-affect is appropriate, oriented x3 Neuro-generalized weakness, no new focal deficits, no tremors MSK-left  leg/knee area hematoma/ecchymosis , blisters have opened up and they have some serosanguineous drainage  --please see photos in epic  Family Communication: Discussed with wife at bedside.  Disposition: SNF Status is: Inpatient  Author: Rendall Carwin, MD 01/15/2024 7:59 PM  For on call review [email or web address].

## 2024-01-16 DIAGNOSIS — N179 Acute kidney failure, unspecified: Secondary | ICD-10-CM | POA: Diagnosis not present

## 2024-01-16 LAB — TYPE AND SCREEN
ABO/RH(D): O POS
Antibody Screen: NEGATIVE
Unit division: 0

## 2024-01-16 LAB — BPAM RBC
Blood Product Expiration Date: 202507232359
ISSUE DATE / TIME: 202507121643
Unit Type and Rh: 5100

## 2024-01-16 LAB — GLUCOSE, CAPILLARY
Glucose-Capillary: 188 mg/dL — ABNORMAL HIGH (ref 70–99)
Glucose-Capillary: 189 mg/dL — ABNORMAL HIGH (ref 70–99)
Glucose-Capillary: 184 mg/dL — ABNORMAL HIGH (ref 70–99)
Glucose-Capillary: 165 mg/dL — ABNORMAL HIGH (ref 70–99)

## 2024-01-16 LAB — HEMOGLOBIN AND HEMATOCRIT, BLOOD
HCT: 28.6 % — ABNORMAL LOW (ref 39.0–52.0)
Hemoglobin: 8.6 g/dL — ABNORMAL LOW (ref 13.0–17.0)

## 2024-01-16 NOTE — Progress Notes (Signed)
 Physical Therapy Treatment Patient Details Name: Tommy Riley AGE MRN: [id number] DOB: Feb 07, 1946 Today's Date: 01/16/2024   History of Present Illness Tommy Riley is a 78 y.o. male with medical history significant of history of atrial flutter/atrial fibrillation on chronic anticoagulation, chronic kidney disease stage II, hypertension, type 2 diabetes mellitus with nephropathy, chronic respiratory failure with hypoxia due to COPD, hyperlipidemia, lymphedema and history of class II obesity; who presented to the hospital after mechanical fall.  Patient reports no chest pain, no shortness of breath, no nausea, no vomiting, no prodrome make symptoms and essentially just tripping causing his fall.  Significant bruise appreciated on his knees; patient expressed no hitting his head. (per MD)    PT Comments  Pt tolerated today's treatment session, well with improved carryover and motivation  for ambulation and sit/stand transfers. Was received in recliner.  Today's session addressed reducing assist for transfers and ambulation with tactile cues, verbal cues, and postural adjustments to ease mobility. Pt noted with improvements for multiple sit/stands to CGA for balance, with proper UE placement on AD and supportive surface. Pt ambulated 57ft today, but with reduced support at [facility] with constant verbal and tactile cues. Pt showing increased motivation today. . Pt would continue to benefit from skilled acute physical therapy services in order to progress toward POC goals, safety/independence with functional mobility and QOL.     If plan is discharge home, recommend the following: A lot of help with bathing/dressing/bathroom;A lot of help with walking and/or transfers;Help with stairs or ramp for entrance;Assistance with cooking/housework   Can travel by private vehicle     Yes  Equipment Recommendations  None recommended by PT    Recommendations for Other Services       Precautions / Restrictions  Precautions Precautions: Fall Recall of Precautions/Restrictions: Intact Precaution/Restrictions Comments: L knee hematoma Restrictions Weight Bearing Restrictions Per Provider Order: No     Mobility  Bed Mobility                 Patient Response: Cooperative  Transfers Overall transfer level: Needs assistance Equipment used: Rolling walker (2 wheels) Transfers: Sit to/from Stand, Bed to chair/wheelchair/BSC Sit to Stand: Min assist, Contact guard assist, Mod assist   Step pivot transfers: Min assist, Contact guard assist       General transfer comment: sit/stands for multiple repetitions from recliner and EOB for transfer practice. progressed from mod assist to CGA at end of reps seated on EOB with bed slightly elevated. one step pivot transfer at end of session to [facility] on L side as pt requrest at EOS. Pt demosntrating CGA to min assist with bed/chair transfer. Pt was educated to call NT once finished.    Ambulation/Gait Ambulation/Gait assistance: Contact guard assist Gait Distance (Feet): 11 Feet Assistive device: Rolling walker (2 wheels) Gait Pattern/deviations: Decreased step length - right, Decreased step length - left, Decreased stride length, Decreased stance time - left, Trunk flexed, Antalgic Gait velocity: slow     General Gait Details: 52ft from recliner to EOB with CGA, no physical lifting requiring, cues for proper RW management given, but improved tolerance.   Stairs             Wheelchair Mobility     Tilt Bed Tilt Bed Patient Response: Cooperative  Modified Rankin (Stroke Patients Only)       Balance Overall balance assessment: Needs assistance Sitting-balance support: Feet supported, No upper extremity supported Sitting balance-Leahy Scale: Good Sitting balance - Comments: seated at EOB  Standing balance support: Reliant on assistive device for balance, During functional activity, Bilateral upper extremity supported Standing  balance-Leahy Scale: Fair Standing balance comment: fair/fairusing RW                            Communication Communication Communication: No apparent difficulties  Cognition Arousal: Alert Behavior During Therapy: WFL for tasks assessed/performed                             Following commands: Intact      Cueing Cueing Techniques: Verbal cues  Exercises      General Comments        Pertinent Vitals/Pain Pain Assessment Pain Assessment: Faces Faces Pain Scale: Hurts little more Pain Location: L knee wound Pain Descriptors / Indicators: Guarding, Sore    Home Living                          Prior Function            PT Goals (current goals can now be found in the care plan section) Acute Rehab PT Goals Patient Stated Goal: return home with family to assist PT Goal Formulation: With patient Time For Goal Achievement: 01/26/24 Potential to Achieve Goals: Good    Frequency    Min 3X/week      PT Plan      Co-evaluation              AM-PAC PT 6 Clicks Mobility   Outcome Measure  Help needed turning from your back to your side while in a flat bed without using bedrails?: A Little Help needed moving from lying on your back to sitting on the side of a flat bed without using bedrails?: A Little Help needed moving to and from a bed to a chair (including a wheelchair)?: A Little Help needed standing up from a chair using your arms (e.g., wheelchair or bedside chair)?: A Little Help needed to walk in hospital room?: A Lot Help needed climbing 3-5 steps with a railing? : A Lot 6 Click Score: 16    End of Session Equipment Utilized During Treatment: Oxygen Activity Tolerance: Patient tolerated treatment well;Patient limited by fatigue Patient left: in chair;with call bell/phone within reach Nurse Communication: Mobility status PT Visit Diagnosis: Unsteadiness on feet (R26.81);Other abnormalities of gait and mobility  (R26.89);Muscle weakness (generalized) (M62.81)     Time: 9083-9059 PT Time Calculation (min) (ACUTE ONLY): 24 min  Charges:    $Therapeutic Activity: 23-37 mins PT General Charges $$ ACUTE PT VISIT: 1 Visit                     Omega JONETTA Donna ALMETA, DPT [facility]-  [phone] office   Omega JONETTA Donna 01/16/2024, 10:26 AM

## 2024-01-16 NOTE — Progress Notes (Signed)
 Dressing changed on L knee, leg hematoma with soap and water. L leg warm to touch, weeping was noted. Wound dark in appearance with some small intact serous filled bullae. Applied Xeroform gauze, covered with Kerlix with light compression. Pt currently sitting in chair with bilateral extremities elevated.

## 2024-01-16 NOTE — Plan of Care (Signed)
  Problem: Health Behavior/Discharge Planning: Goal: Ability to manage health-related needs will improve Outcome: Progressing   Problem: Clinical Measurements: Goal: Ability to maintain clinical measurements within normal limits will improve Outcome: Progressing Goal: Will remain free from infection Outcome: Progressing   Problem: Activity: Goal: Risk for activity intolerance will decrease Outcome: Progressing   Problem: Pain Managment: Goal: General experience of comfort will improve and/or be controlled Outcome: Progressing

## 2024-01-16 NOTE — Progress Notes (Signed)
 Progress Note   Patient: Tommy Riley FMW:[id number] DOB: 29-Apr-1946 DOA: 01/09/2024     7 DOS: the patient was seen and examined on 01/16/2024   Brief hospital admission narrative: LORIE MELICHAR is a 78 y.o. male with medical history significant of history of atrial flutter/atrial fibrillation on chronic anticoagulation, chronic kidney disease stage II, hypertension, type 2 diabetes mellitus with nephropathy, chronic respiratory failure with hypoxia due to COPD, hyperlipidemia, lymphedema and history of class II obesity; who presented to the hospital after mechanical fall.  Patient reports no chest pain, no shortness of breath, no nausea, no vomiting, no prodrome make symptoms and essentially just tripping causing his fall.  Significant bruise appreciated on his knees; patient expressed no hitting his head.   Workup significant for positive decrease in patient's hemoglobin into the 8.2 range (chronically around 10-13); elevated MCV at 100.4 and comprehensive metabolic panel demonstrating acute kidney injury creatinine of 1.54 and potassium 5.2.  Fecal occult blood test negative and patient reported no dysuria, hematuria, melena or hematemesis.   GI service consulted and TRH contacted to place patient in the hospital for further evaluation and management.  Assessment and plan 1)Mechanical Fall - Continue as needed analgesics and follow following physical therapy evaluation -physical therapy eval appreciated recommends SNF rehab  2)Anemia in a patient on chronic anticoagulation - No overt bleeding appreciated  Goal is for hemoglobin above 8 as much as possible. - Patient high risk for endoscopic evaluation and in the absence of overt bleeding and negative fecal occult blood test we will continue to follow trend - Continue holding anticoagulations - High concerns for significant left leg hematoma after mechanical fall; CT scan confirming very extensive hematoma affecting left lower leg.   Case discussed with orthopedic service who recommended no surgical intervention at the moment. - Continue supportive care and follow clinical response. - -  Hgb is 8.6 after Transfusion of a total of 4 units of PRBC this admission  3-history of paroxysmal atrial fibrillation - Continue metoprolol  for rate control - Continue holding Eliquis  - Continue telemetry monitoring and follow electrolytes trend.  4-type 2 diabetes mellitus with nephropathy - Chronic kidney disease stage II at baseline - Continue to maintain adequate hydration, follow renal function trend and continue sliding scale insulin  and Semglee   5-hypertension - Continue metoprolol  - Holding other antihypertensive agents and diuretics in the setting of acute kidney injury  6-chronic diastolic heart failure - Continue daily weights, strict I's and O's and low-sodium diet - Holding Demadex , losartan  and spironolactone  in the setting of acute kidney injury.  7-acute kidney injury on chronic kidney disease - Stage II at baseline - Continue to minimize nephrotoxic agent - Follow renal function trend.  8-class II obesity - Follow low-calorie diet and portion control -Body mass index is 39.27 kg/m.   9-history of gout - Acute exacerbation: Appreciate - Continue treatment with allopurinol .  10-chronic respiratory failure with hypoxia/COPD/OSA - Continue CPAP nightly - Continue chronic oxygen supplementation - No acute exacerbation appreciated and will continue home bronchodilator management.SABRA  11)Generalized Weakness and Deconditioning--- physical therapy evaluation appreciated recommends SNF rehab -- Patient and wife agreeable to SNF rehab  Subjective: - Working with physical therapist No fever  Or chills  - Wife at bedside,  questions answered  Physical Exam: Vitals:   01/15/24 2303 01/16/24 0843 01/16/24 0854 01/16/24 1242  BP:  (!) 138/57  (!) 120/43  Pulse: 73 71  74  Resp: 20   20  Temp:  98.9 F  (37.2 C)  TempSrc:    Oral  SpO2:   97% 100%  Weight:      Height:       Physical Exam Gen:- Awake Alert, in no acute distress , obese, HEENT:- [place].AT, No sclera icterus Nose- [place] 3L/min  Neck-Supple Neck,No JVD,.  Lungs-  CTAB , fair air movement bilaterally  CV- S1, S2 normal, RRR Abd-  +ve B.Sounds, Abd Soft, No tenderness,    Extremity:- Good pedal pulses  Psych-affect is appropriate, oriented x3 Neuro-generalized weakness, no new focal deficits, no tremors MSK-left leg/knee area hematoma/ecchymosis , blisters have opened up and they have some serosanguineous drainage  --please see photos in epic  Family Communication: Discussed with wife at bedside.  Disposition: SNF Status is: Inpatient  Author: Rendall Carwin, MD 01/16/2024 2:59 PM  For on call review [email or web address].

## 2024-01-17 DIAGNOSIS — N179 Acute kidney failure, unspecified: Secondary | ICD-10-CM | POA: Diagnosis not present

## 2024-01-17 LAB — CBC
HCT: 28.2 % — ABNORMAL LOW (ref 39.0–52.0)
Hemoglobin: 8.8 g/dL — ABNORMAL LOW (ref 13.0–17.0)
MCH: 30.2 pg (ref 26.0–34.0)
MCHC: 31.2 g/dL (ref 30.0–36.0)
MCV: 96.9 fL (ref 80.0–100.0)
Platelets: 209 K/uL (ref 150–400)
RBC: 2.91 MIL/uL — ABNORMAL LOW (ref 4.22–5.81)
RDW: 16.3 % — ABNORMAL HIGH (ref 11.5–15.5)
WBC: 8.5 K/uL (ref 4.0–10.5)
nRBC: 0.2 % (ref 0.0–0.2)

## 2024-01-17 LAB — GLUCOSE, CAPILLARY
Glucose-Capillary: 172 mg/dL — ABNORMAL HIGH (ref 70–99)
Glucose-Capillary: 190 mg/dL — ABNORMAL HIGH (ref 70–99)
Glucose-Capillary: 198 mg/dL — ABNORMAL HIGH (ref 70–99)
Glucose-Capillary: 219 mg/dL — ABNORMAL HIGH (ref 70–99)

## 2024-01-17 LAB — BASIC METABOLIC PANEL WITH GFR
Anion gap: 10 (ref 5–15)
BUN: 47 mg/dL — ABNORMAL HIGH (ref 8–23)
CO2: 30 mmol/L (ref 22–32)
Calcium: 8.5 mg/dL — ABNORMAL LOW (ref 8.9–10.3)
Chloride: 99 mmol/L (ref 98–111)
Creatinine, Ser: 1.63 mg/dL — ABNORMAL HIGH (ref 0.61–1.24)
GFR, Estimated: 44 mL/min — ABNORMAL LOW (ref >60)
Glucose, Bld: 185 mg/dL — ABNORMAL HIGH (ref 70–99)
Potassium: 3.4 mmol/L — ABNORMAL LOW (ref 3.5–5.1)
Sodium: 139 mmol/L (ref 135–145)

## 2024-01-17 MED ORDER — ALBUTEROL SULFATE (2.5 MG/3ML) 0.083% IN NEBU
2.5000 mg | INHALATION_SOLUTION | Freq: Three times a day (TID) | RESPIRATORY_TRACT | Status: DC
  Administered 2024-01-17 – 2024-01-18 (×2): 2.5 mg via RESPIRATORY_TRACT
  Filled 2024-01-17 (×2): qty 3

## 2024-01-17 MED ORDER — POTASSIUM CHLORIDE CRYS ER 20 MEQ PO TBCR
40.0000 meq | EXTENDED_RELEASE_TABLET | ORAL | Status: AC
  Administered 2024-01-17 (×2): 40 meq via ORAL
  Filled 2024-01-17: qty 2
  Filled 2024-01-17: qty 4

## 2024-01-17 MED ORDER — POTASSIUM CHLORIDE CRYS ER 20 MEQ PO TBCR
40.0000 meq | EXTENDED_RELEASE_TABLET | ORAL | Status: AC
  Administered 2024-01-17 (×2): 40 meq via ORAL
  Filled 2024-01-17 (×2): qty 2

## 2024-01-17 NOTE — TOC Progression Note (Signed)
 Transition of Care [facility]) - Progression Note    Patient Details  Name: Tommy Riley MRN: [id number] Date of Birth: 1945-09-02  [facility] CM/SW Contact  Lucie Lunger, [place] Phone Number: 01/17/2024, 12:43 PM  Clinical Narrative:    CSW reached out to Sylar with [facility] who states they are reviewing pts chart. Skylar also states that insurance auth was started for SNF and they are requesting an updated PT note. CSW sent yesterday's PT note to [facility] via secure email, they will upload to auth for review. TOC to follow.   Expected Discharge Plan: Home w Home Health Services Barriers to Discharge: Continued Medical Work up  Expected Discharge Plan and Services In-house Referral: Clinical Social Work Discharge Planning Services: CM Consult   Living arrangements for the past 2 months: Single Family Home Expected Discharge Date: 01/17/24                         HH Arranged: PT HH Agency: [facility] Date HH Agency Contacted: 01/10/24 Time HH Agency Contacted: [phone] Representative spoke with at [facility] Agency: [name]   Social Determinants of Health (SDOH) Interventions SDOH Screenings   Food Insecurity: No Food Insecurity (01/09/2024)  Housing: Low Risk  (01/09/2024)  Transportation Needs: No Transportation Needs (01/09/2024)  Utilities: Not At Risk (01/09/2024)  Alcohol Screen: Low Risk  (05/01/2021)  Financial Resource Strain: Low Risk  (05/01/2021)  Social Connections: Unknown (01/09/2024)  Tobacco Use: High Risk (01/09/2024)    Readmission Risk Interventions     No data to display

## 2024-01-17 NOTE — Progress Notes (Signed)
   01/17/24 2243  BiPAP/CPAP/SIPAP  BiPAP/CPAP/SIPAP Pt Type Adult  BiPAP/CPAP/SIPAP DREAMSTATIOND  Mask Type Nasal mask  Mask Size Medium  Respiratory Rate 17 breaths/min  EPAP 14 cmH2O  Flow Rate 5 lpm  Patient Home Machine No  Patient Home Mask No  Patient Home Tubing No  Auto Titrate No  Device Plugged into RED Power Outlet Yes  BiPAP/CPAP /SiPAP Vitals  Pulse Rate 74  Resp 17  SpO2 94 %  Bilateral Breath Sounds Clear;Diminished  MEWS Score/Color  MEWS Score 0  MEWS Score Color [place]

## 2024-01-17 NOTE — Progress Notes (Addendum)
 Progress Note   Patient: Tommy Riley FMW:[id number] DOB: February 21, 1946 DOA: 01/09/2024     8 DOS: the patient was seen and examined on 01/17/2024   Brief hospital admission narrative: Tommy Riley is a 78 y.o. male with medical history significant of history of atrial flutter/atrial fibrillation on chronic anticoagulation, chronic kidney disease stage II, hypertension, type 2 diabetes mellitus with nephropathy, chronic respiratory failure with hypoxia due to COPD, hyperlipidemia, lymphedema and history of class II obesity; who presented to the hospital after mechanical fall.  Patient reports no chest pain, no shortness of breath, no nausea, no vomiting, no prodrome make symptoms and essentially just tripping causing his fall.  Significant bruise appreciated on his knees; patient expressed no hitting his head.   Workup significant for positive decrease in patient's hemoglobin into the 8.2 range (chronically around 10-13); elevated MCV at 100.4 and comprehensive metabolic panel demonstrating acute kidney injury creatinine of 1.54 and potassium 5.2.  Fecal occult blood test negative and patient reported no dysuria, hematuria, melena or hematemesis.   GI service consulted and TRH contacted to place patient in the hospital for further evaluation and management.  As of 01/17/2024 patient remains medically stable for discharge to SNF rehab when bed is available and insurance authorization can be obtained  Assessment and plan 1)Mechanical Fall - Continue as needed analgesics and follow following physical therapy evaluation -physical therapy eval appreciated recommends SNF rehab  2)Anemia in a patient on chronic anticoagulation - No overt bleeding appreciated  Goal is for hemoglobin above 8 as much as possible. - Patient high risk for endoscopic evaluation and in the absence of overt bleeding and negative fecal occult blood test we will continue to follow trend - Continue holding  anticoagulations - High concerns for significant left leg hematoma after mechanical fall; CT scan confirming very extensive hematoma affecting left lower leg.  Case discussed with orthopedic service who recommended no surgical intervention at the moment. - Continue supportive care and follow clinical response. Hgb 7.2 >>8.6 >>8.8 - -  Hgb is 8.8 after Transfusion of a total of 4 units of PRBC this admission  3-history of paroxysmal atrial fibrillation - Continue metoprolol  for rate control - Continue holding Eliquis  due to #2 above - Continue telemetry monitoring and follow electrolytes trend.  4-type 2 diabetes mellitus with nephropathy - Chronic kidney disease stage II at baseline - Continue to maintain adequate hydration, follow renal function trend and continue sliding scale insulin  and Semglee  - Continue Farxiga   5-hypertension - Continue metoprolol  and Demadex   6-chronic diastolic heart failure - Continue daily weights, strict I's and O's and low-sodium diet -Continue Demadex  80 mg daily -Continue Farxiga  and Toprol -XL - Holding Losartan  and Spironolactone  in the setting of acute kidney injury.  7-acute kidney injury on chronic kidney disease - Stage II at baseline - Continue to minimize nephrotoxic agent - Follow renal function trend.  8-class II obesity - Follow low-calorie diet and portion control -Body mass index is 38.99 kg/m.   9-history of gout - Acute exacerbation: Appreciate - Continue treatment with allopurinol .  10-chronic respiratory failure with hypoxia/COPD/OSA -Continue supplemental oxygen at 3 L by nasal cannula - Continue CPAP nightly - Continue chronic oxygen supplementation - No acute exacerbation appreciated and will continue home bronchodilator management.SABRA  11)Generalized Weakness and Deconditioning--- physical therapy evaluation appreciated recommends SNF rehab -- Patient and wife agreeable to SNF rehab  12)HypoKalemia--due to diuretic use,  replace potassium  Subjective: - Comfortably, no new concerns - Eating and drinking  well As of 01/17/2024 patient remains medically stable for discharge to SNF rehab when bed is available and insurance authorization can be obtained  Physical Exam: Vitals:   01/16/24 2026 01/17/24 0416 01/17/24 0629 01/17/24 0834  BP: 119/71 (!) (P) 143/69    Pulse: 74 (P) 71    Resp: 20     Temp: 98.1 F (36.7 C) (P) 98.5 F (36.9 C)    TempSrc: Oral (P) Oral    SpO2: 100% (P) 100%  100%  Weight:   126.8 kg   Height:       Physical Exam Gen:- Awake Alert, in no acute distress , obese, HEENT:- [place].AT, No sclera icterus Nose- [place] 3L/min  Neck-Supple Neck,No JVD,.  Lungs-  CTAB , fair air movement bilaterally  CV- S1, S2 normal, RRR, prior sternotomy scar Abd-  +ve B.Sounds, Abd Soft, No tenderness, increased truncal adiposity    Extremity:- Good pedal pulses  Psych-affect is appropriate, oriented x3 Neuro-generalized weakness, no new focal deficits, no tremors MSK-resolving/improving left leg/knee area hematoma/ecchymosis , blisters have opened up -No evidence of significant superimposed secondary infection --please see photos in epic  Family Communication: Discussed with wife at bedside.  Disposition: SNF Status is: Inpatient  Author: Rendall Carwin, MD 01/17/2024 3:20 PM  For on call review [email or web address].

## 2024-01-17 NOTE — Plan of Care (Signed)
  Problem: Health Behavior/Discharge Planning: Goal: Ability to manage health-related needs will improve Outcome: Progressing   Problem: Clinical Measurements: Goal: Respiratory complications will improve Outcome: Progressing   Problem: Elimination: Goal: Will not experience complications related to urinary retention Outcome: Progressing   Problem: Pain Managment: Goal: General experience of comfort will improve and/or be controlled Outcome: Progressing   Problem: Safety: Goal: Ability to remain free from injury will improve Outcome: Progressing   Problem: Skin Integrity: Goal: Risk for impaired skin integrity will decrease Outcome: Progressing   Problem: Coping: Goal: Ability to adjust to condition or change in health will improve Outcome: Progressing   Problem: Health Behavior/Discharge Planning: Goal: Ability to manage health-related needs will improve Outcome: Progressing   Problem: Metabolic: Goal: Ability to maintain appropriate glucose levels will improve Outcome: Progressing   Problem: Nutritional: Goal: Progress toward achieving an optimal weight will improve Outcome: Progressing   Problem: Skin Integrity: Goal: Risk for impaired skin integrity will decrease Outcome: Progressing

## 2024-01-17 NOTE — Plan of Care (Signed)

## 2024-01-18 DIAGNOSIS — S8002XD Contusion of left knee, subsequent encounter: Secondary | ICD-10-CM | POA: Diagnosis not present

## 2024-01-18 DIAGNOSIS — D62 Acute posthemorrhagic anemia: Secondary | ICD-10-CM | POA: Diagnosis not present

## 2024-01-18 DIAGNOSIS — N179 Acute kidney failure, unspecified: Secondary | ICD-10-CM | POA: Diagnosis not present

## 2024-01-18 LAB — GLUCOSE, CAPILLARY: Glucose-Capillary: 178 mg/dL — ABNORMAL HIGH (ref 70–99)

## 2024-01-18 MED ORDER — ALBUTEROL SULFATE (2.5 MG/3ML) 0.083% IN NEBU
2.5000 mg | INHALATION_SOLUTION | RESPIRATORY_TRACT | Status: DC | PRN
  Filled 2024-01-18: qty 3

## 2024-01-18 MED ORDER — LOSARTAN POTASSIUM 50 MG PO TABS
50.0000 mg | ORAL_TABLET | Freq: Every day | ORAL | Status: DC
Start: 2024-01-18 — End: 2024-02-23

## 2024-01-18 MED ORDER — TORSEMIDE 40 MG PO TABS: 80.0000 mg | ORAL_TABLET | Freq: Every day | ORAL | Status: DC

## 2024-01-18 MED ORDER — APIXABAN 5 MG PO TABS: 5.0000 mg | ORAL_TABLET | Freq: Two times a day (BID) | ORAL | Status: DC

## 2024-01-18 MED ORDER — OXYCODONE HCL 10 MG PO TABS: 10.0000 mg | ORAL_TABLET | Freq: Four times a day (QID) | ORAL | 0 refills | Status: DC | PRN

## 2024-01-18 MED ORDER — SPIRONOLACTONE 25 MG PO TABS: 25.0000 mg | ORAL_TABLET | Freq: Every day | ORAL | Status: DC

## 2024-01-18 MED ORDER — POTASSIUM CHLORIDE CRYS ER 20 MEQ PO TBCR: 20.0000 meq | EXTENDED_RELEASE_TABLET | Freq: Every day | ORAL | 3 refills | Status: DC

## 2024-01-18 NOTE — Discharge Summary (Signed)
 Physician Discharge Summary   Patient: Tommy Riley MRN: [id number] DOB: [birth year]  Admit date:     01/09/2024  Discharge date: 01/18/24  Discharge Physician: Eric Nunnery   PCP: Marchelle Clem Pitts, MD   Recommendations at discharge:  Repeat CBC to follow hemoglobin trend/stability Repeat basic metabolic panel to follow electrolytes and renal function Continue to closely follow patient's volume status and further adjust diuretics/heart failure GDMT regimen. Make sure patient follow-up with cardiology as previously instructed. Continue to follow CBG fluctuation/A1c with further adjustment to hypoglycemia regimen as required.  Discharge Diagnoses: Principal Problem:   AKI (acute kidney injury) (HCC) Active Problems:   ABLA (acute blood loss anemia)   Traumatic hematoma of left knee   Other cirrhosis of liver (HCC) Class II obesity  Brief hospital admission narrative: Tommy Riley is a 78 y.o. male with medical history significant of history of atrial flutter/atrial fibrillation on chronic anticoagulation, chronic kidney disease stage II, hypertension, type 2 diabetes mellitus with nephropathy, chronic respiratory failure with hypoxia due to COPD, hyperlipidemia, lymphedema and history of class II obesity; who presented to the hospital after mechanical fall.  Patient reports no chest pain, no shortness of breath, no nausea, no vomiting, no prodrome make symptoms and essentially just tripping causing his fall.  Significant bruise appreciated on his knees; patient expressed no hitting his head.   Workup significant for positive decrease in patient's hemoglobin into the 8.2 range (chronically around 10-13); elevated MCV at 100.4 and comprehensive metabolic panel demonstrating acute kidney injury creatinine of 1.54 and potassium 5.2.  Fecal occult blood test negative and patient reported no dysuria, hematuria, melena or hematemesis.   GI service consulted and [facility] contacted to place  patient in the hospital for further evaluation and management.  Assessment and Plan: 1)Mechanical Fall - Continue as needed analgesics and follow following physical therapy evaluation  -physical therapy eval appreciated recommends SNF rehab   2)Anemia in a patient on chronic anticoagulation - No overt bleeding appreciated  Goal is for hemoglobin above 8 as much as possible. - Patient high risk for endoscopic evaluation and in the absence of overt bleeding and negative fecal occult blood test we will continue to follow trend - Continue holding anticoagulations for another week at least. - High concerns for significant left leg hematoma after mechanical fall; CT scan confirming very extensive hematoma affecting left lower leg.  Case discussed with orthopedic service who recommended no surgical intervention at the moment. - Continue supportive care and follow clinical response. Hgb 7.2 >>8.6 >>8.8 at time of discharge. - Patient require a total of 4 units PRBC transfused throughout hospitalization.   - Continue to follow hemoglobin trend with repeat CBC intermittently.   3-history of paroxysmal atrial fibrillation - Continue metoprolol  for rate control - Continue holding Eliquis  due to #2 above - Continue outpatient follow-up with cardiology service.   4-type 2 diabetes mellitus with nephropathy - Chronic kidney disease stage II at baseline - Continue to maintain adequate hydration, follow renal function trend and resume home insulin  therapy. - Continue Farxiga    5-hypertension - Continue metoprolol  and Demadex  -Heart healthy/low-sodium diet to be continued.   6-chronic diastolic heart failure - Continue daily weights, strict I's and O's and low-sodium diet -Continue Demadex  80 mg daily -Continue Farxiga  and Toprol -XL - Continue holding Losartan  and Spironolactone  in the setting of acute kidney injury; patient to follow-up with PCP/cardiology service prior to resumption.   7-acute  kidney injury on chronic kidney disease - Stage II  at baseline - Continue to minimize nephrotoxic agents - Continue to follow renal function trend. -Maintain adequate hydration.   8-class II obesity - Follow low-calorie diet and portion control. -Body mass index is 38.99 kg/m.    9-history of gout - Continue treatment with allopurinol .   10-chronic respiratory failure with hypoxia/COPD/OSA -Continue supplemental oxygen at 3 L by nasal cannula - Continue CPAP nightly - Continue chronic oxygen supplementation - No acute exacerbation appreciated and will continue home bronchodilator management.SABRA   11)Generalized Weakness and Deconditioning--- physical therapy evaluation appreciated recommends SNF rehab -- Patient and wife agreeable to SNF rehab. - Appreciate assistance by [facility] service for placement.   12)HypoKalemia--due to diuretic use. - Continue to follow electrolytes and further replete - Maintenance supplementation therapy has been restarted.  Consultants: Orthopedic service. Procedures performed: See below for x-ray reports. Disposition: Skilled nursing facility Diet recommendation: Modified carbohydrate/heart healthy and low-sodium diet.  DISCHARGE MEDICATION: Allergies as of 01/18/2024       Reactions   Codeine Anaphylaxis, Shortness Of Breath, Other (See Comments)   Tightness in chest, Chest pain        Medication List     STOP taking these medications    aspirin  EC 81 MG tablet   diphenhydramine -acetaminophen  25-500 MG Tabs tablet Commonly known as: TYLENOL  PM   oxyCODONE -acetaminophen  5-325 MG tablet Commonly known as: PERCOCET/ROXICET   oxymetazoline  0.05 % nasal spray Commonly known as: AFRIN       TAKE these medications    Accu-Chek Aviva Plus test strip Generic drug: glucose blood   albuterol  108 (90 Base) MCG/ACT inhaler Commonly known as: VENTOLIN  HFA USE 2 INHALATIONS BY MOUTH EVERY 4 HOURS AS NEEDED FOR SHORTNESS  OF BREATH OR  WHEEZE   albuterol  (2.5 MG/3ML) 0.083% nebulizer solution Commonly known as: PROVENTIL  Take 3 mLs (2.5 mg total) by nebulization every 6 (six) hours as needed for wheezing or shortness of breath.   allopurinol  100 MG tablet Commonly known as: ZYLOPRIM  Taking two tablets by mouth in the morning and 1 tablet by mouth in the evening   apixaban  5 MG Tabs tablet Commonly known as: Eliquis  Take 1 tablet (5 mg total) by mouth 2 (two) times daily. Start taking on: January 25, 2024 What changed: These instructions start on January 25, 2024. If you are unsure what to do until then, ask your doctor or other care provider.   Centrum Cardio Tabs Take 1 tablet by mouth in the morning.   dapagliflozin  propanediol 10 MG Tabs tablet Commonly known as: Farxiga  Take 1 tablet (10 mg total) by mouth daily.   Dexcom G7 Sensor Misc See admin instructions.   ezetimibe  10 MG tablet Commonly known as: ZETIA  Take 10 mg by mouth in the morning.   fluticasone  50 MCG/ACT nasal spray Commonly known as: FLONASE  Place 1 spray into both nostrils daily.   folic acid  400 MCG tablet Commonly known as: FOLVITE  Take 400 mcg by mouth every evening.   insulin  degludec 100 UNIT/ML FlexTouch Pen Commonly known as: TRESIBA Inject 50 Units into the skin 2 (two) times daily.   loratadine  10 MG tablet Commonly known as: CLARITIN  Take 10 mg by mouth in the morning.   losartan  50 MG tablet Commonly known as: COZAAR  Take 1 tablet (50 mg total) by mouth daily. Continue to hold until follow up with cardiology service What changed: additional instructions   melatonin 5 MG Tabs Take 5 mg by mouth at bedtime.   metoprolol  200 MG 24 hr tablet Commonly  known as: TOPROL -XL TAKE 1 TABLET BY MOUTH DAILY  WITH OR IMMEDIATELY FOLLOWING A  MEAL   NovoLOG  FlexPen 100 UNIT/ML FlexPen Generic drug: insulin  aspart Inject 2-20 Units into the skin See admin instructions. As directed twice daily sliding scale   omega-3 acid ethyl  esters 1 g capsule Commonly known as: LOVAZA  Take 2 g by mouth 2 (two) times daily.   ONE TOUCH ULTRA 2 w/Device Kit by Does not apply route.   Oxycodone  HCl 10 MG Tabs Take 1 tablet (10 mg total) by mouth every 6 (six) hours as needed for severe pain (pain score 7-10).   Ozempic (0.25 or 0.5 MG/DOSE) 2 MG/3ML Sopn Generic drug: Semaglutide(0.25 or 0.5MG /DOS) Inject 1 mg into the skin once a week. On Fridays   potassium chloride  SA 20 MEQ tablet Commonly known as: KLOR-CON  M Take 1 tablet (20 mEq total) by mouth daily. What changed: See the new instructions.   RELION PEN NEEDLE 31G/8MM 31G X 8 MM Misc Generic drug: Insulin  Pen Needle 3 (three) times daily.   rosuvastatin  40 MG tablet Commonly known as: CRESTOR  TAKE 1 TABLET BY MOUTH DAILY   sertraline  100 MG tablet Commonly known as: ZOLOFT  Take 150 mg by mouth in the morning.   spironolactone  25 MG tablet Commonly known as: ALDACTONE  Take 1 tablet (25 mg total) by mouth daily. Continue to hold until follow up with cardiology service. What changed: additional instructions   Torsemide  40 MG Tabs Take 80 mg by mouth daily. Start taking on: January 19, 2024 What changed:  medication strength how much to take when to take this additional instructions   Trelegy Ellipta  100-62.5-25 MCG/ACT Aepb Generic drug: Fluticasone -Umeclidin-Vilant Inhale 1 puff into the lungs daily.   VITAMIN C PO Take 2,000 mg by mouth daily.   Vitamin D3 50 MCG (2000 UT) Tabs Take 1 capsule by mouth in the morning and at bedtime.   zinc  sulfate (50mg  elemental zinc ) 220 (50 Zn) MG capsule Take 220 mg by mouth every evening.               Discharge Care Instructions  (From admission, onward)           Start     Ordered   01/18/24 0000  Discharge wound care:       Comments: Cleanse L knee/ leg hematoma with soap and water, dry and apply Xeroform Soila [phone]) gauze  to open areas and blisters daily, cover with ABD pad and secure  with Kerlix roll gauze.  May apply Ace bandage wrapped in same fashion as Kerlix for light compression.   01/18/24 1106            Discharge Exam: Filed Weights   01/13/24 0519 01/14/24 0411 01/17/24 0629  Weight: 127.4 kg 127.7 kg 126.8 kg   General exam: Alert, awake, oriented x 3; obese.  No acute distress. Respiratory system: Good air movement bilaterally; no using accessory muscle.  Good saturation on chronic 3 L supplementation. Cardiovascular system: Rate controlled, no rubs, no gallops, no JVD. Gastrointestinal system: Abdomen is is, nondistended, soft and nontender. No organomegaly or masses felt. Normal bowel sounds heard. Central nervous system: No focal neurological deficits. Extremities: No cyanosis or clubbing; resolving/improving left leg knee area hematoma/ecchymosis.  Patient's blisters have opened up there is no evidence of significant superimposed secondary infection. Skin: No petechiae. Psychiatry: Judgement and insight appear normal. Mood & affect appropriate.    Condition at discharge: Stable and improved.  The results of  significant diagnostics from this hospitalization (including imaging, microbiology, ancillary and laboratory) are listed below for reference.   Imaging Studies: CT HEAD WO CONTRAST ( ) Result Date: 01/11/2024 CLINICAL DATA:  Initial evaluation for acute memory loss. EXAM: CT HEAD WITHOUT CONTRAST TECHNIQUE: Contiguous axial images were obtained from the base of the skull through the vertex without intravenous contrast. RADIATION DOSE REDUCTION: This exam was performed according to the departmental dose-optimization program which includes automated exposure control, adjustment of the mA and/or kV according to patient size and/or use of iterative reconstruction technique. COMPARISON:  MRI from 10/18/2019. FINDINGS: Brain: Mild age-related cerebral atrophy with moderate chronic microvascular ischemic disease. Few scatter remote lacunar infarcts  present about the hemispheric cerebral white matter and bilateral basal ganglia. No acute intracranial hemorrhage. No acute large vessel territory infarct. No mass lesion or midline shift. No hydrocephalus or extra-axial fluid collection. Vascular: No abnormal hyperdense vessel. Calcified atherosclerosis present at skull base. Skull: Scalp soft tissues demonstrate no acute finding. Calvarium intact. Sinuses/Orbits: Globes orbital soft tissues within normal limits. Visualized paranasal sinuses and mastoid air cells are clear. Other: None. IMPRESSION: 1. No acute intracranial abnormality. 2. Mild age-related cerebral atrophy with moderate chronic microvascular ischemic disease, with a few scattered remote lacunar infarcts about the hemispheric cerebral white matter and bilateral basal ganglia. Electronically Signed   By: Morene Hoard M.D.   On: 01/11/2024 19:21   CT KNEE LEFT WO CONTRAST Result Date: 01/10/2024 CLINICAL DATA:  Knee pain and trauma EXAM: CT OF THE LEFT KNEE WITHOUT CONTRAST TECHNIQUE: Multidetector CT imaging of the left knee was performed according to the standard protocol. Multiplanar CT image reconstructions were also generated. RADIATION DOSE REDUCTION: This exam was performed according to the departmental dose-optimization program which includes automated exposure control, adjustment of the mA and/or kV according to patient size and/or use of iterative reconstruction technique. COMPARISON:  None Available. FINDINGS: Bones/Joint/Cartilage Generalized osteopenia. No fracture or dislocation. Normal alignment. No joint effusion. Mild chondrocalcinosis of the lateral femorotibial compartment as can be seen with CPPD. Ligaments Ligaments are suboptimally evaluated by CT. Muscles and Tendons Muscles are normal. No muscle atrophy. No intramuscular fluid collection or hematoma. Soft tissue Large hyperdense fluid collection along the lateral aspect of the lower leg within the subcutaneous fat  extending from the distal thigh and extending into the proximal lower leg measuring 11.1 x 5.8 x 24.3 cm consistent with a hematoma which wraps around along the inferior aspect of the knee. Additional soft tissue edema throughout the distal thigh and proximal lower leg. No soft tissue mass. Peripheral vascular atherosclerotic disease. IMPRESSION: 1. Large hematoma along the lateral aspect of the lower leg within the subcutaneous fat extending from the distal thigh and extending into the proximal lower leg measuring 11.1 x 5.8 x 24.3 cm which wraps around along the inferior aspect of the knee. 2. No acute osseous injury of the left knee. 3.  Peripheral vascular atherosclerotic disease. Electronically Signed   By: Julaine Blanch M.D.   On: 01/10/2024 15:53   US  Renal Result Date: 01/10/2024 CLINICAL DATA:  Acute renal insufficiency. EXAM: RENAL / URINARY TRACT ULTRASOUND COMPLETE COMPARISON:  None Available. FINDINGS: Evaluation is limited due to body habitus and portable technique. Right Kidney: Renal measurements: 10.1 x 5.2 x 5.4 cm = volume: 152 mL. Mild parenchyma atrophy and mild increased echogenicity. No hydronephrosis or shadowing stone. A 1 cm lower pole cyst. Left Kidney: Renal measurements: 13.7 x 6.7 x 5.4 cm = volume: 265 mL. Mild atrophy  and increased echogenicity. A 1 cm hypoechoic area in the interpolar left kidney is poorly evaluated, possibly a cyst. No hydronephrosis or shadowing stone. Bladder: Appears normal for degree of bladder distention. Other: None. IMPRESSION: Mildly atrophic and echogenic kidneys may represent chronic kidney disease. No hydronephrosis or shadowing stone. Electronically Signed   By: Vanetta Chou M.D.   On: 01/10/2024 11:01   DG Knee Complete 4 Views Left Result Date: 01/09/2024 CLINICAL DATA:  Fall EXAM: LEFT KNEE - COMPLETE 4+ VIEW COMPARISON:  None Available. FINDINGS: No fracture of the proximal tibia or distal femur. Patella is normal. No joint effusion.  IMPRESSION: No fracture or dislocation. Electronically Signed   By: Jackquline Boxer M.D.   On: 01/09/2024 12:46   CT CHEST ABDOMEN PELVIS W CONTRAST Result Date: 12/28/2023 CLINICAL DATA:  Trauma. EXAM: CT CHEST, ABDOMEN, AND PELVIS WITH CONTRAST TECHNIQUE: Multidetector CT imaging of the chest, abdomen and pelvis was performed following the standard protocol during bolus administration of intravenous contrast. RADIATION DOSE REDUCTION: This exam was performed according to the departmental dose-optimization program which includes automated exposure control, adjustment of the mA and/or kV according to patient size and/or use of iterative reconstruction technique. CONTRAST:  OMNIPAQUE  IOHEXOL  300 MG/ML  SOLN COMPARISON:  Chest CT dated 04/23/2020. FINDINGS: CT CHEST FINDINGS Cardiovascular: Mild cardiomegaly. No pericardial effusion. There is coronary vascular calcification postsurgical changes of CABG. Left pectoral pacemaker device. Moderate atherosclerotic calcification of the thoracic aorta. No aneurysmal dilatation or dissection. The origins of the great vessels of the aortic arch and the central pulmonary arteries are patent. Mediastinum/Nodes: No hilar or mediastinal adenopathy. The esophagus is grossly unremarkable. No mediastinal fluid collection. Lungs/Pleura: Trace right pleural effusion. There are bibasilar subpleural atelectasis/scarring. No pneumothorax. The central airways are patent. Musculoskeletal: Median sternotomy wires. Osteopenia with degenerative changes of the spine. Mildly displaced fractures of the posterior right 10th-12th ribs. Probable nondisplaced fracture of the posterior right ninth rib. CT ABDOMEN PELVIS FINDINGS No intra-abdominal free air or free fluid. Hepatobiliary: Mild irregularity of the liver contour may represent early changes of cirrhosis. Clinical correlation is recommended. No biliary dilatation. Gallstone. No pericholecystic fluid or evidence of acute  cholecystitis by CT. Pancreas: Unremarkable. No pancreatic ductal dilatation or surrounding inflammatory changes. Spleen: Normal in size without focal abnormality. Adrenals/Urinary Tract: The adrenal glands unremarkable. There is no hydronephrosis on either side. Small bilateral renal cysts. There is symmetric enhancement and excretion of contrast by both kidneys. The visualized ureters and urinary bladder appear unremarkable. Stomach/Bowel: There is no bowel obstruction or active inflammation. The appendix is normal. Vascular/Lymphatic: Advanced aortoiliac atherosclerotic disease. The IVC is unremarkable. No portal gas. There is no adenopathy. Reproductive: The prostate and seminal vesicles are grossly unremarkable. Other: Stranding of the subcutaneous soft tissues of the right anterior abdominal wall may represent contusion. No fluid collection or hematoma. Musculoskeletal: Osteopenia with degenerative changes of the spine. Mildly displaced fractures of the right L1 and L2 transverse processes. IMPRESSION: 1. Multiple right posterior rib fractures as well as fractures of the right L1-L2 transverse processes. No pneumothorax. 2. Small right pleural effusion and right lung base atelectasis. 3. Cholelithiasis. 4.  Aortic Atherosclerosis (ICD10-I70.0). Electronically Signed   By: Vanetta Chou M.D.   On: 12/28/2023 13:03   DG Chest Port 1 View Result Date: 12/28/2023 CLINICAL DATA:  Shortness of breath. EXAM: PORTABLE CHEST 1 VIEW COMPARISON:  August 28, 2021. FINDINGS: Stable cardiomegaly. Sternotomy wires are noted. Left-sided defibrillator is unchanged. No definite acute pulmonary abnormality is  noted. Bony thorax is unremarkable. IMPRESSION: No active disease. Electronically Signed   By: Lynwood Landy Raddle M.D.   On: 12/28/2023 11:25    Microbiology: Results for orders placed or performed during the hospital encounter of 08/28/21  Resp Panel by RT-PCR (Flu A&B, Covid) Nasopharyngeal Swab     Status: None    Collection Time: 08/28/21  8:36 PM   Specimen: Nasopharyngeal Swab; Nasopharyngeal(NP) swabs in vial transport medium  Result Value Ref Range Status   SARS Coronavirus 2 by RT PCR NEGATIVE NEGATIVE Final    Comment: (NOTE) SARS-CoV-2 target nucleic acids are NOT DETECTED.  The SARS-CoV-2 RNA is generally detectable in upper respiratory specimens during the acute phase of infection. The lowest concentration of SARS-CoV-2 viral copies this assay can detect is 138 copies/mL. A negative result does not preclude SARS-Cov-2 infection and should not be used as the sole basis for treatment or other patient management decisions. A negative result may occur with  improper specimen collection/handling, submission of specimen other than nasopharyngeal swab, presence of viral mutation(s) within the areas targeted by this assay, and inadequate number of viral copies(<138 copies/mL). A negative result must be combined with clinical observations, patient history, and epidemiological information. The expected result is Negative.  Fact Sheet for Patients:  BloggerCourse.com  Fact Sheet for Healthcare Providers:  SeriousBroker.it  This test is no t yet approved or cleared by the [place]  FDA and  has been authorized for detection and/or diagnosis of SARS-CoV-2 by FDA under an Emergency Use Authorization (EUA). This EUA will remain  in effect (meaning this test can be used) for the duration of the COVID-19 declaration under Section 564(b)(1) of the Act, 21 U.S.C.section 360bbb-3(b)(1), unless the authorization is terminated  or revoked sooner.       Influenza A by PCR NEGATIVE NEGATIVE Final   Influenza B by PCR NEGATIVE NEGATIVE Final    Comment: (NOTE) The Xpert Xpress SARS-CoV-2/FLU/RSV plus assay is intended as an aid in the diagnosis of influenza from Nasopharyngeal swab specimens and should not be used as a sole basis for treatment.  Nasal washings and aspirates are unacceptable for Xpert Xpress SARS-CoV-2/FLU/RSV testing.  Fact Sheet for Patients: BloggerCourse.com  Fact Sheet for Healthcare Providers: SeriousBroker.it  This test is not yet approved or cleared by the [place]  FDA and has been authorized for detection and/or diagnosis of SARS-CoV-2 by FDA under an Emergency Use Authorization (EUA). This EUA will remain in effect (meaning this test can be used) for the duration of the COVID-19 declaration under Section 564(b)(1) of the Act, 21 U.S.C. section 360bbb-3(b)(1), unless the authorization is terminated or revoked.  Performed at [facility], [address] [place], [place] [address]     Labs: CBC: Recent Labs  Lab 01/12/24 0456 01/13/24 0423 01/14/24 0936 01/15/24 0230 01/16/24 0444 01/17/24 0355  WBC 10.4 10.3 10.5  --   --  8.5  HGB 7.3* 7.5* 7.9* 7.2* 8.6* 8.8*  HCT 23.5* 24.4* 24.4* 23.6* 28.6* 28.2*  MCV 99.2 99.6 97.2  --   --  96.9  PLT 151 149* 170  --   --  209   Basic Metabolic Panel: Recent Labs  Lab 01/12/24 0428 01/13/24 0423 01/14/24 0936 01/17/24 0355  NA 138 138 136 139  K 5.6* 4.7 4.2 3.4*  CL 101 100 95* 99  CO2 23 27 26 30   GLUCOSE 164* 148* 238* 185*  BUN 40* 41* 46* 47*  CREATININE 1.72* 1.45* 1.62* 1.63*  CALCIUM  8.6* 8.6* 8.7* 8.5*  MG  --  2.7*  --   --   PHOS  --  4.0 4.1  --    Liver Function Tests: Recent Labs  Lab 01/13/24 0423 01/14/24 0936  ALBUMIN 2.9* 2.8*   CBG: Recent Labs  Lab 01/17/24 0720 01/17/24 1121 01/17/24 1602 01/17/24 1956 01/18/24 0727  GLUCAP 172* 190* 198* 219* 178*    Discharge time spent: 35 minutes.  Signed: Eric Nunnery, MD [facility] 01/18/2024

## 2024-01-18 NOTE — Plan of Care (Signed)
  Problem: Education: Goal: Knowledge of General Education information will improve Description: Including pain rating scale, medication(s)/side effects and non-pharmacologic comfort measures Outcome: Adequate for Discharge   Problem: Health Behavior/Discharge Planning: Goal: Ability to manage health-related needs will improve Outcome: Adequate for Discharge   Problem: Clinical Measurements: Goal: Ability to maintain clinical measurements within normal limits will improve Outcome: Adequate for Discharge Goal: Will remain free from infection Outcome: Adequate for Discharge Goal: Diagnostic test results will improve Outcome: Adequate for Discharge Goal: Respiratory complications will improve Outcome: Adequate for Discharge Goal: Cardiovascular complication will be avoided Outcome: Adequate for Discharge   Problem: Activity: Goal: Risk for activity intolerance will decrease Outcome: Adequate for Discharge   Problem: Nutrition: Goal: Adequate nutrition will be maintained Outcome: Adequate for Discharge   Problem: Coping: Goal: Level of anxiety will decrease Outcome: Adequate for Discharge   Problem: Elimination: Goal: Will not experience complications related to bowel motility Outcome: Adequate for Discharge Goal: Will not experience complications related to urinary retention Outcome: Adequate for Discharge   Problem: Pain Managment: Goal: General experience of comfort will improve and/or be controlled Outcome: Adequate for Discharge   Problem: Safety: Goal: Ability to remain free from injury will improve Outcome: Adequate for Discharge   Problem: Skin Integrity: Goal: Risk for impaired skin integrity will decrease Outcome: Adequate for Discharge   Problem: Education: Goal: Ability to describe self-care measures that may prevent or decrease complications (Diabetes Survival Skills Education) will improve Outcome: Adequate for Discharge Goal: Individualized Educational  Video(s) Outcome: Adequate for Discharge   Problem: Coping: Goal: Ability to adjust to condition or change in health will improve Outcome: Adequate for Discharge   Problem: Fluid Volume: Goal: Ability to maintain a balanced intake and output will improve Outcome: Adequate for Discharge   Problem: Health Behavior/Discharge Planning: Goal: Ability to identify and utilize available resources and services will improve Outcome: Adequate for Discharge Goal: Ability to manage health-related needs will improve Outcome: Adequate for Discharge   Problem: Metabolic: Goal: Ability to maintain appropriate glucose levels will improve Outcome: Adequate for Discharge   Problem: Nutritional: Goal: Maintenance of adequate nutrition will improve Outcome: Adequate for Discharge Goal: Progress toward achieving an optimal weight will improve Outcome: Adequate for Discharge   Problem: Skin Integrity: Goal: Risk for impaired skin integrity will decrease Outcome: Adequate for Discharge   Problem: Tissue Perfusion: Goal: Adequacy of tissue perfusion will improve Outcome: Adequate for Discharge   Problem: Acute Rehab PT Goals(only PT should resolve) Goal: Pt Will Go Supine/Side To Sit Outcome: Adequate for Discharge Goal: Patient Will Transfer Sit To/From Stand Outcome: Adequate for Discharge Goal: Pt Will Transfer Bed To Chair/Chair To Bed Outcome: Adequate for Discharge Goal: Pt Will Ambulate Outcome: Adequate for Discharge   Problem: Acute Rehab OT Goals (only OT should resolve) Goal: Pt. Will Perform Grooming Outcome: Adequate for Discharge Goal: Pt. Will Perform Lower Body Bathing Outcome: Adequate for Discharge Goal: Pt. Will Perform Lower Body Dressing Outcome: Adequate for Discharge Goal: Pt. Will Transfer To Toilet Outcome: Adequate for Discharge Goal: Pt. Will Perform Toileting-Clothing Manipulation Outcome: Adequate for Discharge Goal: Pt/Caregiver Will Perform Home Exercise  Program Outcome: Adequate for Discharge

## 2024-01-18 NOTE — Plan of Care (Signed)
   Problem: Education: Goal: Knowledge of General Education information will improve Description: Including pain rating scale, medication(s)/side effects and non-pharmacologic comfort measures Outcome: Progressing   Problem: Health Behavior/Discharge Planning: Goal: Ability to manage health-related needs will improve Outcome: Progressing   Problem: Clinical Measurements: Goal: Will remain free from infection Outcome: Progressing

## 2024-01-18 NOTE — TOC Transition Note (Signed)
 Transition of Care [facility]) - Discharge Note   Patient Details  Name: Tommy Riley MRN: [id number] Date of Birth: 10-Feb-1946  Transition [facility]) CM/SW Contact:  Sharlyne Stabs, RN Phone Number: 01/18/2024, 10:17 AM  Authorization received, Skylar from [employer] provided a room number. CM updated his wife, she will meet him in [place] later at the facility. Rn will call report, TOC scheduling EMS when DC summary and report is completed.     Final next level of care: Skilled Nursing Facility Barriers to Discharge: Barriers Resolved   Patient Goals and CMS Choice Patient states their goals for this hospitalization and ongoing recovery are:: agreeable to SNF CMS Medicare.gov Compare Post Acute Care list provided to:: Patient Represenative (must comment) Choice offered to / list presented to : Spouse      Discharge Placement                Patient to be transferred to facility by: EMS Name of family member notified: Spouse Patient and family notified of of transfer: 01/18/24  Discharge Plan and Services Additional resources added to the After Visit Summary for   In-house Referral: Clinical Social Work Discharge Planning Services: CM Consult                      HH Arranged: PT HH Agency: [facility] Date [facility] Agency Contacted: 01/10/24 Time HH Agency Contacted: [phone] Representative spoke with at [facility] Agency: [name]  Social Drivers of Health (SDOH) Interventions SDOH Screenings   Food Insecurity: No Food Insecurity (01/09/2024)  Housing: Low Risk  (01/09/2024)  Transportation Needs: No Transportation Needs (01/09/2024)  Utilities: Not At Risk (01/09/2024)  Alcohol Screen: Low Risk  (05/01/2021)  Financial Resource Strain: Low Risk  (05/01/2021)  Social Connections: Unknown (01/09/2024)  Tobacco Use: High Risk (01/09/2024)

## 2024-01-19 NOTE — Progress Notes (Signed)
 This encounter was created in error - please disregard.

## 2024-01-26 ENCOUNTER — Ambulatory Visit: Admitting: Internal Medicine

## 2024-01-26 NOTE — Telephone Encounter (Signed)
 Received call from patient spouse. She reports Dr. Cinderella wanted pt to have another liver bx in August. Unsure what to do as patient is still in rehab facility in [place] due to a bad fall. Dr. Cinderella, please advise if you want patient to wait until he is out of rehab?

## 2024-01-31 NOTE — Telephone Encounter (Signed)
 Hi , no repeat liver biopsy suggested at this time. He needs repeat ultrasound of liver every 6 months from last performed

## 2024-01-31 NOTE — Telephone Encounter (Signed)
LMOVM  to call back for spouse

## 2024-02-01 NOTE — Telephone Encounter (Signed)
 LMOVM to call back

## 2024-02-03 ENCOUNTER — Ambulatory Visit: Payer: Medicare Other

## 2024-02-03 DIAGNOSIS — I255 Ischemic cardiomyopathy: Secondary | ICD-10-CM

## 2024-02-04 ENCOUNTER — Telehealth (INDEPENDENT_AMBULATORY_CARE_PROVIDER_SITE_OTHER): Payer: Self-pay | Admitting: Gastroenterology

## 2024-02-04 LAB — CUP PACEART REMOTE DEVICE CHECK
Battery Remaining Longevity: 126 mo
Battery Remaining Percentage: 100 %
Brady Statistic RA Percent Paced: 99 %
Brady Statistic RV Percent Paced: 0 %
Date Time Interrogation Session: 20250729193800
HighPow Impedance: 59 Ohm
Implantable Lead Connection Status: 753985
Implantable Lead Connection Status: 753985
Implantable Lead Connection Status: 753985
Implantable Lead Implant Date: 20221103
Implantable Lead Implant Date: 20221103
Implantable Lead Implant Date: 20221103
Implantable Lead Location: 753858
Implantable Lead Location: 753860
Implantable Lead Location: 753860
Implantable Lead Model: 138
Implantable Lead Model: 3830
Implantable Lead Model: 4671
Implantable Lead Serial Number: 304581
Implantable Lead Serial Number: 852093
Implantable Pulse Generator Implant Date: 20221103
Lead Channel Impedance Value: 421 Ohm
Lead Channel Impedance Value: 507 Ohm
Lead Channel Impedance Value: 519 Ohm
Lead Channel Setting Pacing Amplitude: 2.5 V
Lead Channel Setting Sensing Sensitivity: 0.5 mV
Lead Channel Setting Sensing Sensitivity: 1 mV
Pulse Gen Serial Number: 390312

## 2024-02-04 NOTE — Telephone Encounter (Signed)
 Hi , no repeat liver biopsy suggested at this time. He needs repeat ultrasound of liver every 6 months from last performed

## 2024-02-04 NOTE — Telephone Encounter (Signed)
 Noted. Wife has called multiple times and we have [facility]

## 2024-02-04 NOTE — Telephone Encounter (Signed)
 FYI- Pt wife left voicemail stating that pt was currently in hospital at [facility]) due to bad fall and that provider wanted him to have lab work in August and a liver biopsy. Pt wife states that pt will not be able to complete lab work and biopsy and will need to cancel any upcoming appt until pt is released from hospital .

## 2024-02-05 ENCOUNTER — Ambulatory Visit: Payer: Self-pay | Admitting: Internal Medicine

## 2024-02-07 NOTE — Telephone Encounter (Signed)
 Wife called in. She is aware of below.

## 2024-02-08 ENCOUNTER — Ambulatory Visit (INDEPENDENT_AMBULATORY_CARE_PROVIDER_SITE_OTHER): Admitting: Gastroenterology

## 2024-02-15 ENCOUNTER — Telehealth (INDEPENDENT_AMBULATORY_CARE_PROVIDER_SITE_OTHER): Payer: Self-pay | Admitting: Gastroenterology

## 2024-02-15 ENCOUNTER — Other Ambulatory Visit (INDEPENDENT_AMBULATORY_CARE_PROVIDER_SITE_OTHER): Payer: Self-pay

## 2024-02-15 DIAGNOSIS — Z6837 Body mass index (BMI) 37.0-37.9, adult: Secondary | ICD-10-CM

## 2024-02-15 DIAGNOSIS — K7460 Unspecified cirrhosis of liver: Secondary | ICD-10-CM

## 2024-02-15 DIAGNOSIS — R748 Abnormal levels of other serum enzymes: Secondary | ICD-10-CM

## 2024-02-15 NOTE — Telephone Encounter (Signed)
 Dr. Cinderella put in future labs on 10/25/23. Need to release around February 24, 2024 and call patient to let him know he is due for labs. Due February 24, 2024   Lab orders released. Left detailed message on pt voicemail letting him know that labs are due on 02/24/24

## 2024-02-17 ENCOUNTER — Ambulatory Visit: Admitting: Internal Medicine

## 2024-03-06 DEATH — deceased

## 2024-04-05 NOTE — Progress Notes (Signed)
 Remote ICD Transmission

## 2024-04-26 ENCOUNTER — Ambulatory Visit: Admitting: Cardiology

## 2024-05-04 ENCOUNTER — Encounter

## 2024-08-03 ENCOUNTER — Encounter

## 2024-11-02 ENCOUNTER — Encounter

## 2025-02-01 ENCOUNTER — Encounter

## 2025-05-03 ENCOUNTER — Encounter
